# Patient Record
Sex: Female | Born: 1955 | Race: White | Hispanic: No | Marital: Married | Smoking: Never smoker
Health system: Southern US, Community
[De-identification: ages and names within clinical notes are randomized; demographics above are authoritative.]

## PROBLEM LIST (undated history)

## (undated) DIAGNOSIS — M1990 Unspecified osteoarthritis, unspecified site: Secondary | ICD-10-CM

## (undated) DIAGNOSIS — R112 Nausea with vomiting, unspecified: Secondary | ICD-10-CM

## (undated) DIAGNOSIS — T7840XA Allergy, unspecified, initial encounter: Secondary | ICD-10-CM

## (undated) DIAGNOSIS — E039 Hypothyroidism, unspecified: Secondary | ICD-10-CM

## (undated) DIAGNOSIS — E079 Disorder of thyroid, unspecified: Secondary | ICD-10-CM

## (undated) DIAGNOSIS — D649 Anemia, unspecified: Secondary | ICD-10-CM

## (undated) DIAGNOSIS — I10 Essential (primary) hypertension: Secondary | ICD-10-CM

## (undated) DIAGNOSIS — Z9889 Other specified postprocedural states: Secondary | ICD-10-CM

## (undated) HISTORY — PX: BIOPSY THYROID: PRO38

## (undated) HISTORY — DX: Anemia, unspecified: D64.9

## (undated) HISTORY — PX: SIGMOIDOSCOPY: SUR1295

## (undated) HISTORY — PX: TONSILLECTOMY: SUR1361

## (undated) HISTORY — DX: Disorder of thyroid, unspecified: E07.9

## (undated) HISTORY — DX: Essential (primary) hypertension: I10

## (undated) HISTORY — DX: Allergy, unspecified, initial encounter: T78.40XA

## (undated) HISTORY — DX: Unspecified osteoarthritis, unspecified site: M19.90

---

## 1983-11-26 HISTORY — PX: FLEXIBLE SIGMOIDOSCOPY: SHX1649

## 1983-11-26 HISTORY — PX: EXPLORATORY LAPAROTOMY: SUR591

## 1996-11-25 HISTORY — PX: ABDOMINAL HYSTERECTOMY: SHX81

## 1998-11-10 ENCOUNTER — Emergency Department (HOSPITAL_COMMUNITY): Admission: EM | Admit: 1998-11-10 | Discharge: 1998-11-10 | Payer: Self-pay

## 1999-01-18 ENCOUNTER — Encounter: Admission: RE | Admit: 1999-01-18 | Discharge: 1999-02-05 | Payer: Self-pay | Admitting: Orthopedic Surgery

## 1999-01-31 ENCOUNTER — Other Ambulatory Visit: Admission: RE | Admit: 1999-01-31 | Discharge: 1999-01-31 | Payer: Self-pay | Admitting: Gynecology

## 1999-12-07 ENCOUNTER — Encounter: Admission: RE | Admit: 1999-12-07 | Discharge: 1999-12-07 | Payer: Self-pay | Admitting: Gynecology

## 1999-12-07 ENCOUNTER — Encounter: Payer: Self-pay | Admitting: Gynecology

## 2000-03-12 ENCOUNTER — Other Ambulatory Visit: Admission: RE | Admit: 2000-03-12 | Discharge: 2000-03-12 | Payer: Self-pay | Admitting: Gynecology

## 2000-12-24 ENCOUNTER — Encounter: Admission: RE | Admit: 2000-12-24 | Discharge: 2000-12-24 | Payer: Self-pay | Admitting: Gynecology

## 2000-12-24 ENCOUNTER — Encounter: Payer: Self-pay | Admitting: Gynecology

## 2001-04-15 ENCOUNTER — Other Ambulatory Visit: Admission: RE | Admit: 2001-04-15 | Discharge: 2001-04-15 | Payer: Self-pay | Admitting: Gynecology

## 2001-12-29 ENCOUNTER — Encounter: Payer: Self-pay | Admitting: Gynecology

## 2001-12-29 ENCOUNTER — Encounter: Admission: RE | Admit: 2001-12-29 | Discharge: 2001-12-29 | Payer: Self-pay | Admitting: Gynecology

## 2002-04-22 ENCOUNTER — Other Ambulatory Visit: Admission: RE | Admit: 2002-04-22 | Discharge: 2002-04-22 | Payer: Self-pay | Admitting: Gynecology

## 2003-01-06 ENCOUNTER — Encounter: Payer: Self-pay | Admitting: Gynecology

## 2003-01-06 ENCOUNTER — Encounter: Admission: RE | Admit: 2003-01-06 | Discharge: 2003-01-06 | Payer: Self-pay | Admitting: Gynecology

## 2003-12-19 ENCOUNTER — Other Ambulatory Visit: Admission: RE | Admit: 2003-12-19 | Discharge: 2003-12-19 | Payer: Self-pay | Admitting: Gynecology

## 2004-01-20 ENCOUNTER — Ambulatory Visit (HOSPITAL_COMMUNITY): Admission: RE | Admit: 2004-01-20 | Discharge: 2004-01-20 | Payer: Self-pay | Admitting: Gynecology

## 2004-10-02 ENCOUNTER — Ambulatory Visit: Payer: Self-pay | Admitting: Internal Medicine

## 2004-10-03 ENCOUNTER — Ambulatory Visit: Payer: Self-pay | Admitting: Internal Medicine

## 2004-10-04 ENCOUNTER — Ambulatory Visit (HOSPITAL_COMMUNITY): Admission: RE | Admit: 2004-10-04 | Discharge: 2004-10-04 | Payer: Self-pay | Admitting: Internal Medicine

## 2004-10-11 ENCOUNTER — Ambulatory Visit (HOSPITAL_COMMUNITY): Admission: RE | Admit: 2004-10-11 | Discharge: 2004-10-11 | Payer: Self-pay | Admitting: Internal Medicine

## 2004-10-11 ENCOUNTER — Encounter (INDEPENDENT_AMBULATORY_CARE_PROVIDER_SITE_OTHER): Payer: Self-pay | Admitting: *Deleted

## 2005-02-01 ENCOUNTER — Ambulatory Visit: Payer: Self-pay | Admitting: Internal Medicine

## 2005-02-19 ENCOUNTER — Ambulatory Visit (HOSPITAL_COMMUNITY): Admission: RE | Admit: 2005-02-19 | Discharge: 2005-02-19 | Payer: Self-pay | Admitting: Gynecology

## 2005-05-03 ENCOUNTER — Other Ambulatory Visit: Admission: RE | Admit: 2005-05-03 | Discharge: 2005-05-03 | Payer: Self-pay | Admitting: Dermatology

## 2006-02-20 ENCOUNTER — Ambulatory Visit (HOSPITAL_COMMUNITY): Admission: RE | Admit: 2006-02-20 | Discharge: 2006-02-20 | Payer: Self-pay | Admitting: Gynecology

## 2006-05-22 ENCOUNTER — Ambulatory Visit: Payer: Self-pay | Admitting: Internal Medicine

## 2006-05-23 ENCOUNTER — Ambulatory Visit: Payer: Self-pay | Admitting: Internal Medicine

## 2006-06-05 ENCOUNTER — Ambulatory Visit (HOSPITAL_COMMUNITY): Admission: RE | Admit: 2006-06-05 | Discharge: 2006-06-05 | Payer: Self-pay | Admitting: Internal Medicine

## 2006-10-22 ENCOUNTER — Other Ambulatory Visit: Admission: RE | Admit: 2006-10-22 | Discharge: 2006-10-22 | Payer: Self-pay | Admitting: Gynecology

## 2007-03-12 ENCOUNTER — Ambulatory Visit (HOSPITAL_COMMUNITY): Admission: RE | Admit: 2007-03-12 | Discharge: 2007-03-12 | Payer: Self-pay | Admitting: Gynecology

## 2007-07-09 ENCOUNTER — Ambulatory Visit: Payer: Self-pay | Admitting: Family Medicine

## 2007-07-14 ENCOUNTER — Ambulatory Visit: Payer: Self-pay | Admitting: Internal Medicine

## 2007-07-14 DIAGNOSIS — E049 Nontoxic goiter, unspecified: Secondary | ICD-10-CM | POA: Insufficient documentation

## 2007-07-19 LAB — CONVERTED CEMR LAB
CO2: 32 meq/L (ref 19–32)
Chloride: 104 meq/L (ref 96–112)
GFR calc Af Amer: 113 mL/min
Potassium: 3.8 meq/L (ref 3.5–5.1)
Sodium: 141 meq/L (ref 135–145)
T3, Free: 3.2 pg/mL (ref 2.3–4.2)
TSH: 0.25 microintl units/mL — ABNORMAL LOW (ref 0.35–5.50)

## 2007-07-20 ENCOUNTER — Encounter (INDEPENDENT_AMBULATORY_CARE_PROVIDER_SITE_OTHER): Payer: Self-pay | Admitting: *Deleted

## 2007-07-23 ENCOUNTER — Ambulatory Visit (HOSPITAL_COMMUNITY): Admission: RE | Admit: 2007-07-23 | Discharge: 2007-07-23 | Payer: Self-pay | Admitting: Internal Medicine

## 2007-07-28 ENCOUNTER — Encounter (INDEPENDENT_AMBULATORY_CARE_PROVIDER_SITE_OTHER): Payer: Self-pay | Admitting: *Deleted

## 2007-08-14 ENCOUNTER — Telehealth (INDEPENDENT_AMBULATORY_CARE_PROVIDER_SITE_OTHER): Payer: Self-pay | Admitting: *Deleted

## 2007-11-26 HISTORY — PX: COLONOSCOPY: SHX174

## 2008-01-04 ENCOUNTER — Emergency Department (HOSPITAL_COMMUNITY): Admission: EM | Admit: 2008-01-04 | Discharge: 2008-01-04 | Payer: Self-pay | Admitting: Emergency Medicine

## 2008-01-04 ENCOUNTER — Telehealth (INDEPENDENT_AMBULATORY_CARE_PROVIDER_SITE_OTHER): Payer: Self-pay | Admitting: *Deleted

## 2008-01-28 ENCOUNTER — Ambulatory Visit (HOSPITAL_COMMUNITY): Admission: RE | Admit: 2008-01-28 | Discharge: 2008-01-28 | Payer: Self-pay | Admitting: Gastroenterology

## 2008-01-28 LAB — HM COLONOSCOPY

## 2008-04-05 ENCOUNTER — Ambulatory Visit (HOSPITAL_COMMUNITY): Admission: RE | Admit: 2008-04-05 | Discharge: 2008-04-05 | Payer: Self-pay | Admitting: Gynecology

## 2008-07-26 ENCOUNTER — Ambulatory Visit: Payer: Self-pay | Admitting: Internal Medicine

## 2008-07-26 DIAGNOSIS — D649 Anemia, unspecified: Secondary | ICD-10-CM | POA: Insufficient documentation

## 2008-07-26 DIAGNOSIS — M899 Disorder of bone, unspecified: Secondary | ICD-10-CM | POA: Insufficient documentation

## 2008-07-26 DIAGNOSIS — E785 Hyperlipidemia, unspecified: Secondary | ICD-10-CM | POA: Insufficient documentation

## 2008-07-26 DIAGNOSIS — M949 Disorder of cartilage, unspecified: Secondary | ICD-10-CM

## 2008-07-26 DIAGNOSIS — M25512 Pain in left shoulder: Secondary | ICD-10-CM | POA: Insufficient documentation

## 2008-07-26 DIAGNOSIS — I10 Essential (primary) hypertension: Secondary | ICD-10-CM | POA: Insufficient documentation

## 2008-07-26 LAB — CONVERTED CEMR LAB: HDL goal, serum: 50 mg/dL

## 2008-07-27 ENCOUNTER — Ambulatory Visit: Payer: Self-pay | Admitting: Internal Medicine

## 2008-07-30 LAB — CONVERTED CEMR LAB
AST: 29 units/L (ref 0–37)
Alkaline Phosphatase: 63 units/L (ref 39–117)
BUN: 12 mg/dL (ref 6–23)
Basophils Relative: 0.1 % (ref 0.0–3.0)
Calcium: 8.8 mg/dL (ref 8.4–10.5)
Cholesterol: 285 mg/dL (ref 0–200)
Creatinine, Ser: 0.7 mg/dL (ref 0.4–1.2)
Eosinophils Relative: 0.8 % (ref 0.0–5.0)
GFR calc Af Amer: 112 mL/min
GFR calc non Af Amer: 93 mL/min
HCT: 41.2 % (ref 36.0–46.0)
Hemoglobin: 14.5 g/dL (ref 12.0–15.0)
MCHC: 35.1 g/dL (ref 30.0–36.0)
Monocytes Relative: 4.5 % (ref 3.0–12.0)
RDW: 13 % (ref 11.5–14.6)
Rhuematoid fact SerPl-aCnc: <20 intl units/mL — ABNORMAL LOW (ref 0.0–20.0)
Total Bilirubin: 0.8 mg/dL (ref 0.3–1.2)
Triglycerides: 83 mg/dL (ref 0–149)
Vit D, 1,25-Dihydroxy: 39 (ref 30–89)

## 2008-08-02 ENCOUNTER — Telehealth (INDEPENDENT_AMBULATORY_CARE_PROVIDER_SITE_OTHER): Payer: Self-pay | Admitting: *Deleted

## 2008-08-02 ENCOUNTER — Telehealth: Payer: Self-pay | Admitting: Internal Medicine

## 2008-08-02 ENCOUNTER — Encounter (INDEPENDENT_AMBULATORY_CARE_PROVIDER_SITE_OTHER): Payer: Self-pay | Admitting: *Deleted

## 2008-08-03 ENCOUNTER — Encounter (INDEPENDENT_AMBULATORY_CARE_PROVIDER_SITE_OTHER): Payer: Self-pay | Admitting: *Deleted

## 2008-08-05 ENCOUNTER — Ambulatory Visit (HOSPITAL_COMMUNITY): Admission: RE | Admit: 2008-08-05 | Discharge: 2008-08-05 | Payer: Self-pay | Admitting: Internal Medicine

## 2008-08-08 ENCOUNTER — Telehealth (INDEPENDENT_AMBULATORY_CARE_PROVIDER_SITE_OTHER): Payer: Self-pay | Admitting: *Deleted

## 2008-08-12 ENCOUNTER — Encounter (INDEPENDENT_AMBULATORY_CARE_PROVIDER_SITE_OTHER): Payer: Self-pay | Admitting: *Deleted

## 2008-09-20 ENCOUNTER — Encounter: Payer: Self-pay | Admitting: Internal Medicine

## 2008-09-27 ENCOUNTER — Encounter (HOSPITAL_COMMUNITY): Admission: RE | Admit: 2008-09-27 | Discharge: 2008-11-24 | Payer: Self-pay | Admitting: Endocrinology

## 2008-10-07 ENCOUNTER — Encounter (INDEPENDENT_AMBULATORY_CARE_PROVIDER_SITE_OTHER): Payer: Self-pay | Admitting: Interventional Radiology

## 2008-10-07 ENCOUNTER — Ambulatory Visit (HOSPITAL_COMMUNITY): Admission: RE | Admit: 2008-10-07 | Discharge: 2008-10-07 | Payer: Self-pay | Admitting: Endocrinology

## 2008-10-17 ENCOUNTER — Encounter: Payer: Self-pay | Admitting: Internal Medicine

## 2008-11-30 ENCOUNTER — Ambulatory Visit: Payer: Self-pay | Admitting: Internal Medicine

## 2008-12-02 ENCOUNTER — Ambulatory Visit (HOSPITAL_COMMUNITY): Admission: RE | Admit: 2008-12-02 | Discharge: 2008-12-02 | Payer: Self-pay | Admitting: Endocrinology

## 2009-04-27 ENCOUNTER — Ambulatory Visit (HOSPITAL_COMMUNITY): Admission: RE | Admit: 2009-04-27 | Discharge: 2009-04-27 | Payer: Self-pay | Admitting: Gynecology

## 2009-07-27 ENCOUNTER — Encounter (INDEPENDENT_AMBULATORY_CARE_PROVIDER_SITE_OTHER): Payer: Self-pay | Admitting: *Deleted

## 2009-08-08 ENCOUNTER — Ambulatory Visit: Payer: Self-pay | Admitting: Internal Medicine

## 2009-08-17 ENCOUNTER — Ambulatory Visit: Payer: Self-pay | Admitting: Internal Medicine

## 2009-08-17 DIAGNOSIS — Z923 Personal history of irradiation: Secondary | ICD-10-CM | POA: Insufficient documentation

## 2010-01-19 ENCOUNTER — Telehealth (INDEPENDENT_AMBULATORY_CARE_PROVIDER_SITE_OTHER): Payer: Self-pay | Admitting: *Deleted

## 2010-01-23 ENCOUNTER — Ambulatory Visit: Payer: Self-pay | Admitting: Internal Medicine

## 2010-01-23 DIAGNOSIS — M545 Low back pain, unspecified: Secondary | ICD-10-CM | POA: Insufficient documentation

## 2010-01-23 DIAGNOSIS — R002 Palpitations: Secondary | ICD-10-CM | POA: Insufficient documentation

## 2010-01-25 ENCOUNTER — Encounter: Admission: RE | Admit: 2010-01-25 | Discharge: 2010-02-26 | Payer: Self-pay | Admitting: Internal Medicine

## 2010-01-25 ENCOUNTER — Encounter: Payer: Self-pay | Admitting: Internal Medicine

## 2010-02-23 ENCOUNTER — Encounter: Payer: Self-pay | Admitting: Internal Medicine

## 2010-05-03 ENCOUNTER — Ambulatory Visit (HOSPITAL_COMMUNITY): Admission: RE | Admit: 2010-05-03 | Discharge: 2010-05-03 | Payer: Self-pay | Admitting: Gynecology

## 2010-10-23 ENCOUNTER — Telehealth (INDEPENDENT_AMBULATORY_CARE_PROVIDER_SITE_OTHER): Payer: Self-pay | Admitting: *Deleted

## 2010-11-06 ENCOUNTER — Telehealth (INDEPENDENT_AMBULATORY_CARE_PROVIDER_SITE_OTHER): Payer: Self-pay | Admitting: *Deleted

## 2010-12-16 ENCOUNTER — Encounter: Payer: Self-pay | Admitting: Endocrinology

## 2010-12-17 ENCOUNTER — Encounter: Payer: Self-pay | Admitting: Gynecology

## 2010-12-27 NOTE — Progress Notes (Signed)
Summary: heart palpitaion to UC  Phone Note Call from Patient   Caller: Patient Summary of Call: pt c/o heart palpitation x3days. pt denies any SOB,numbness, or tingling. pt advise dr hopper out of office until monday. pt states that she does not have a cardiologist. pt offer appt with another physician pt decline stating that she would rather just wait to see dr hopper on monday. advise pt that with it being her heart we strong recommend that she be seen some where today ED, UC or another physician. pt did agree to go to UC today.............Marland KitchenFelecia Deloach CMA  January 19, 2010 9:06 AM  Initial call taken by: Jeremy Johann CMA,  January 19, 2010 9:07 AM

## 2010-12-27 NOTE — Miscellaneous (Signed)
Summary: PT Initial Summary/[facility]  PT Initial Summary/[facility]   Imported By: Lanelle Bal 02/01/2010 10:06:49  _____________________________________________________________________  External Attachment:    Type:   Image     Comment:   External Document

## 2010-12-27 NOTE — Progress Notes (Signed)
Summary: amlodipine-benazepril refill  Phone Note Refill Request Message from:  Fax from Pharmacy on November 06, 2010 1:23 PM  Refills Requested: Medication #1:  LOTREL 5-20 MG  CAPS 1 once daily   Last Refilled: 08/13/2010 [facility], [facility],  [phone]   fax-[phone]       qty-90  Next Appointment Scheduled: none Initial call taken by: Jerolyn Shin,  November 06, 2010 1:25 PM    Prescriptions: LOTREL 5-20 MG  CAPS (AMLODIPINE BESY-BENAZEPRIL HCL) 1 once daily  #90 Capsule x 0   Entered by:   Shonna Chock CMA   Authorized by:   Marga Melnick MD   Signed by:   Shonna Chock CMA on 11/06/2010   Method used:   Electronically to        [facility]* (retail)       [address]       [address] [place] [address]       [place], [place]  [address]       Ph: [phone]       Fax: [phone]   RxID:   [id number]

## 2010-12-27 NOTE — Miscellaneous (Signed)
Summary: PT Discharge/[facility]  PT Discharge/[facility]   Imported By: Lanelle Bal 03/02/2010 12:51:17  _____________________________________________________________________  External Attachment:    Type:   Image     Comment:   External Document

## 2010-12-27 NOTE — Progress Notes (Signed)
Summary: Refill Reuqest  Phone Note Refill Request Call back at [phone] Message from:  Pharmacy on October 23, 2010 8:40 AM  Refills Requested: Medication #1:  MAXZIDE-25 37.5-25 MG  TABS 1 once daily   Dosage confirmed as above?Dosage Confirmed   Brand Name Necessary? No   Supply Requested: 3 months   Last Refilled: 08/17/2009 [facility]  Next Appointment Scheduled: none Initial call taken by: Harold Barban,  October 23, 2010 8:41 AM    Prescriptions: MAXZIDE-25 37.5-25 MG  TABS (TRIAMTERENE-HCTZ) 1 once daily  #90 Tablet x 0   Entered by:   Shonna Chock CMA   Authorized by:   Marga Melnick MD   Signed by:   Shonna Chock CMA on 10/23/2010   Method used:   Electronically to        [facility]* (retail)       [address]       [address] [place] [address]       [place], [place]  [address]       Ph: [phone]       Fax: [phone]   RxID:   [id number]

## 2010-12-27 NOTE — Assessment & Plan Note (Signed)
Summary: irregular heart beat/ok per alida/kdc   Vital Signs:  Patient profile:   55 year old female Weight:      184.8 pounds BMI:     31.34 O2 Sat:      96 % Temp:     98.3 degrees F oral Pulse rate:   90 / minute Resp:     14 per minute BP sitting:   110 / 72  (left arm) Cuff size:   large  Vitals Entered By: Shonna Chock (January 23, 2010 1:39 PM) CC: Irregular heartbeat, Palpitations Comments REVIEWED MED LIST, PATIENT AGREED DOSE AND INSTRUCTION CORRECT    CC:  Irregular heartbeat and Palpitations.  History of Present Illness:  Palpitations      This is a 55 year old woman who presents with Palpitations since 01/18/2010.  The patient denies dizziness, presyncope, syncope, chest pain, shortness of breath, and throat tightness.  She is on HRT for menopausal related  heat intolerance.  The patient denies the following symptoms: blurred vision, numbness, weakness, diaphoresis, nausea, shortness of breath, and weight loss.  The palpitations are described as a sensation of the heart skipping beats.  The palpitations are sudden in onset, intermittent, occur at rest, and occur during the day; not worse with exercise.  The palpitations are worse with increased caffeine intake last week as diet cola, tea, & chocolate.PMH of RAI 12/2008 for multinodular goiter; TSH was therapeupeutic @ 2.4 @ Dr Willeen Cass office.  Allergies: 1)  ! Sulfa  Review of Systems MS:  Complains of low back pain; LBP affects sleep; no Rx. Neuro:  Denies brief paralysis, numbness, tingling, tremors, and weakness. Psych:  Denies anxiety.  Physical Exam  General:  well-nourished,in no acute distress; alert,appropriate and cooperative throughout examination Eyes:  No corneal or conjunctival inflammation noted.Perrla. No lid lag Neck:  No deformities, masses, or tenderness noted.Asymmetry R > L with fibrous texture Heart:  Normal rate and regular rhythm. S1 and S2 normal without gallop, murmur, click, rub .  S4 Pulses:  R and L carotid,radial,dorsalis pedis and posterior tibial pulses are full and equal bilaterally Extremities:  No clubbing, cyanosis, edema, or deformity noted with normal full range of motion of all joints.  Neg SLR Neurologic:  alert & oriented X3, strength normal in all extremities, and DTRs symmetrical and normal. No tremor  Skin:  Intact without suspicious lesions or rashes Cervical Nodes:  No lymphadenopathy noted Axillary Nodes:  No palpable lymphadenopathy Psych:  memory intact for recent and remote, normally interactive, and good eye contact.     Impression & Recommendations:  Problem # 1:  PALPITATIONS (ICD-785.1)  probably from stimulants  Orders: EKG w/ Interpretation (93000)  Problem # 2:  LOW BACK PAIN SYNDROME (ICD-724.2)  probably related to mattress  Orders: Physical Therapy Referral (PT)  Complete Medication List: 1)  Lotrel 5-20 Mg Caps (Amlodipine besy-benazepril hcl) .Marland Kitchen.. 1 once daily 2)  Maxzide-25 37.5-25 Mg Tabs (Triamterene-hctz) .Marland Kitchen.. 1 once daily 3)  Synthroid 75 Mcg Tabs (Levothyroxine sodium) .... Take 1 tab once daily 4)  Vivelle-dot 0.0375 Mg/24hr Pttw (Estradiol) .... Twice weekly  Patient Instructions: 1)  AS Tylenol 1-2 at bedtime as needed. Evaluate you mattress for firmness.Avoid stimulants; Event monitor if palpitations no better with decreased caffeine

## 2011-01-14 ENCOUNTER — Encounter: Payer: Self-pay | Admitting: Internal Medicine

## 2011-01-29 ENCOUNTER — Ambulatory Visit (INDEPENDENT_AMBULATORY_CARE_PROVIDER_SITE_OTHER): Payer: Self-pay | Admitting: Family Medicine

## 2011-01-29 DIAGNOSIS — Z713 Dietary counseling and surveillance: Secondary | ICD-10-CM

## 2011-01-31 NOTE — Miscellaneous (Signed)
Summary: Home Visit Report/[facility]  Home Visit Report/[facility]   Imported By: Maryln Gottron 01/22/2011 14:44:53  _____________________________________________________________________  External Attachment:    Type:   Image     Comment:   External Document

## 2011-04-04 ENCOUNTER — Other Ambulatory Visit (HOSPITAL_COMMUNITY): Payer: Self-pay | Admitting: Gynecology

## 2011-04-04 DIAGNOSIS — Z1231 Encounter for screening mammogram for malignant neoplasm of breast: Secondary | ICD-10-CM

## 2011-04-09 NOTE — Op Note (Signed)
NAME:  Joan [name], Joan [name]                   ACCOUNT NO.:  [id number]   MEDICAL RECORD NO.:  [id number]          PATIENT TYPE:  AMB   LOCATION:  ENDO                         FACILITY:  [facility]   PHYSICIAN:  Bernette Redbird, M.D.   DATE OF BIRTH:  25-May-1956   DATE OF PROCEDURE:  DATE OF DISCHARGE:                               OPERATIVE REPORT   PROCEDURE:  Colonoscopy.   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   FINDINGS:  Normal exam to the terminal ileum.   PROCEDURE:  The procedure had been reviewed with the patient including  its risks and she provided written consent.  Sedation was fentanyl 100  mcg and Versed 11 mg IV without clinical instability.  The Pentax adult  video colonoscope was advanced without significant difficulty around the  colon to the cecum as identified by visualization of the appendiceal  orifice and then for short distance into a normal-appearing terminal  ileum.  Pullback was then performed.  The quality of prep was excellent  and it is felt that all areas were well seen.   This was a completely normal examination.  No polyps, cancer, colitis,  vascular ectasia or diverticular disease was observed.  Retroflexion in  the rectum and reinspection of the rectosigmoid were unremarkable.  No  biopsies were obtained.  The patient tolerated the procedure well and  there no apparent complications.   IMPRESSION:  Normal screening colonoscopy in a standard risk  individual.   PLAN:  Repeat colonoscopy in 10 years.           ______________________________  Bernette Redbird, M.D.     RB/MEDQ  D:  01/28/2008  T:  01/28/2008  Job:  [id number]   cc:   Titus Dubin. Alwyn Ren, MD,FACP,FCCP  [phone] [place]  [place] [address]

## 2011-04-10 ENCOUNTER — Encounter: Payer: Self-pay | Admitting: Internal Medicine

## 2011-04-12 ENCOUNTER — Other Ambulatory Visit: Payer: Self-pay | Admitting: Internal Medicine

## 2011-04-25 ENCOUNTER — Encounter: Payer: Self-pay | Admitting: Internal Medicine

## 2011-04-26 ENCOUNTER — Ambulatory Visit (INDEPENDENT_AMBULATORY_CARE_PROVIDER_SITE_OTHER): Payer: 59 | Admitting: Internal Medicine

## 2011-04-26 ENCOUNTER — Encounter: Payer: Self-pay | Admitting: Internal Medicine

## 2011-04-26 VITALS — BP 122/79 | HR 74 | Temp 98.2°F | Resp 14 | Ht 64.25 in | Wt 185.6 lb

## 2011-04-26 DIAGNOSIS — M545 Low back pain, unspecified: Secondary | ICD-10-CM

## 2011-04-26 DIAGNOSIS — I10 Essential (primary) hypertension: Secondary | ICD-10-CM

## 2011-04-26 DIAGNOSIS — E039 Hypothyroidism, unspecified: Secondary | ICD-10-CM

## 2011-04-26 DIAGNOSIS — E049 Nontoxic goiter, unspecified: Secondary | ICD-10-CM

## 2011-04-26 DIAGNOSIS — E785 Hyperlipidemia, unspecified: Secondary | ICD-10-CM

## 2011-04-26 DIAGNOSIS — Z0000 Encounter for general adult medical examination without abnormal findings: Secondary | ICD-10-CM

## 2011-04-26 DIAGNOSIS — D649 Anemia, unspecified: Secondary | ICD-10-CM

## 2011-04-26 NOTE — Progress Notes (Signed)
Subjective:    Patient ID: Joan [name], female    DOB: 1956/10/28, 55 y.o.   MRN: [id number]  HPI Joan [name] is here for a physical; she has been seeing Dr Talmage Nap in reference to dyslipidemia & hypothyroid . Dyslipidemia assessment: Prior Advanced Lipid Testing: NMR Lipoprofile 2011.   Family history of premature CAD/ MI: bro MI @ 22 .  Nutrition: low fat .  Exercise: decreased due to back  pain . Diabetes : no . HTN: 120/75 on average. Smoking history  : never .   Weight :  Varies, 10# down since 2011. ROS: fatigue: no ; chest pain : no ;claudication: no; palpitations: no; abd pain/bowel changes: no ; myalgias:"aching all over" on 5 mg Crestor after 2 weeks. She decreased to qod X 1 week ;  syncope : no ; memory loss: no;skin changes: no. Lab results reviewed :LDL 168 on 04/06/11 pre Crestor.   Review of Systems Patient reports no vision/ hearing  changes, adenopathy,fever,   persistant / recurrent hoarseness , swallowing issues, edema,persistant /recurrent cough, hemoptysis, dyspnea( rest/ exertional/paroxysmal nocturnal), gastrointestinal bleeding(melena, rectal bleeding),  significant heartburn,  GU symptoms(dysuria, hematuria,pyuria, incontinence), Gyn symptoms(abnormal  bleeding , pain),  syncope, focal weakness, abnormal bruising or bleeding, anxiety,or depression. She has occasional numbness & tingling in hands @ night. She has had pain in low back & L shoulder; no treatment.     Objective:   Physical Exam Gen.: Healthy and well-nourished in appearance. Alert, appropriate and cooperative throughout exam. 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 Eyes: No corneal or conjunctival inflammation noted. Pupils equal round reactive to light and accommodation. Fundal exam is benign without hemorrhages, exudate, papilledema. Extraocular motion intact. Vision grossly normal. Ears: External  ear exam reveals no significant lesions or deformities. Canals clear .TMs normal. Hearing is  grossly normal bilaterally. Nose: External nasal exam reveals no deformity or inflammation. Nasal mucosa are pink and moist. No lesions or exudates noted. Septum  not deviated  Mouth: Oral mucosa and oropharynx reveal no lesions or exudates. Teeth in good repair. Neck: No deformities, masses, or tenderness noted. Range of motion & . Thyroid : R > L & firm w/o nodules. Lungs: Normal respiratory effort; chest expands symmetrically. Lungs are clear to auscultation without rales, wheezes, or increased work of breathing. Heart: Normal rate and rhythm. Normal S1 and S2. No gallop, click, or rub. S4 with slurring ; no murmur. Abdomen: Bowel sounds normal; abdomen soft and nontender. No masses, organomegaly or hernias noted. Genitalia: Dr Nicholas Lose.                                                                                                                                                               Musculoskeletal/extremities: No deformity or scoliosis noted of  the thoracic  or lumbar spine. No clubbing, cyanosis, edema, or deformity noted. Range of motion  normal .Tone & strength  normal.Joints normal. Nail health  Good. Pain R LS area with SLR Vascular: Carotid, radial artery, dorsalis pedis and dorsalis posterior tibial pulses are full and equal. No bruits present. Neurologic: Alert and oriented x3. Deep tendon reflexes symmetrical and normal.                                                                                       Skin: Intact without suspicious lesions or rashes. Lymph: No cervical, axillary, or inguinal lymphadenopathy present. Psych: Mood and affect are normal. Normally interactive                                                                                         Assessment & Plan:  #1 comprehensive physical exam; no acute issues  #2 dyslipidemia; LDL goal equal less than 100 based on advanced  testing. Strong family history of coronary disease with one instance of premature  MI  #3 low back syndrome, chronic  Plan: Fasting lipids, AST, ALT, and CK after 2 weeks of Crestor 5 mg daily  #3 flexion/ extension films of the low back.

## 2011-04-26 NOTE — Assessment & Plan Note (Signed)
As per Dr Balan 

## 2011-04-26 NOTE — Assessment & Plan Note (Signed)
Monitor CBC 

## 2011-04-26 NOTE — Patient Instructions (Signed)
Preventive Health Care: Exercise  30-45  minutes a day, 3-4 days a week. Walking is especially valuable in preventing Osteoporosis. Eat a low-fat diet with lots of fruits and vegetables, up to 7-9 servings per day. Avoid obesity; your goal = waist less than 35 inches.Consume less than 30 grams of sugar per day from foods & drinks with High Fructose Corn Syrup as #2,3 or #4 on label.  

## 2011-04-26 NOTE — Assessment & Plan Note (Signed)
Crestor appropriate; fasting lipids, CK ,AST & ALT after 10  Weeks of Rx

## 2011-04-28 ENCOUNTER — Encounter: Payer: Self-pay | Admitting: Internal Medicine

## 2011-04-30 ENCOUNTER — Encounter: Payer: 59 | Attending: Family Medicine | Admitting: *Deleted

## 2011-04-30 DIAGNOSIS — E7800 Pure hypercholesterolemia, unspecified: Secondary | ICD-10-CM | POA: Insufficient documentation

## 2011-04-30 DIAGNOSIS — E663 Overweight: Secondary | ICD-10-CM | POA: Insufficient documentation

## 2011-04-30 DIAGNOSIS — Z713 Dietary counseling and surveillance: Secondary | ICD-10-CM | POA: Insufficient documentation

## 2011-05-07 ENCOUNTER — Ambulatory Visit (HOSPITAL_COMMUNITY)
Admission: RE | Admit: 2011-05-07 | Discharge: 2011-05-07 | Disposition: A | Discharge status: Home / Self Care | Payer: 59 | Source: Ambulatory Visit | Attending: Gynecology | Admitting: Gynecology

## 2011-05-07 DIAGNOSIS — Z1231 Encounter for screening mammogram for malignant neoplasm of breast: Secondary | ICD-10-CM | POA: Insufficient documentation

## 2011-05-10 ENCOUNTER — Other Ambulatory Visit: Payer: Self-pay | Admitting: Internal Medicine

## 2011-05-10 ENCOUNTER — Other Ambulatory Visit (INDEPENDENT_AMBULATORY_CARE_PROVIDER_SITE_OTHER): Payer: 59

## 2011-05-10 DIAGNOSIS — T887XXA Unspecified adverse effect of drug or medicament, initial encounter: Secondary | ICD-10-CM

## 2011-05-10 DIAGNOSIS — E785 Hyperlipidemia, unspecified: Secondary | ICD-10-CM

## 2011-05-10 LAB — AST: AST: 21 U/L (ref 0–37)

## 2011-05-10 LAB — CK: Total CK: 253 U/L — ABNORMAL HIGH (ref 7–177)

## 2011-05-10 LAB — LIPID PANEL
HDL: 53.1 mg/dL (ref >39.00)
Total CHOL/HDL Ratio: 3
VLDL: 16.4 mg/dL (ref 0.0–40.0)

## 2011-07-08 ENCOUNTER — Other Ambulatory Visit: Payer: Self-pay | Admitting: Internal Medicine

## 2011-09-03 ENCOUNTER — Encounter: Payer: Self-pay | Admitting: *Deleted

## 2011-09-03 ENCOUNTER — Telehealth: Payer: Self-pay

## 2011-09-03 NOTE — Telephone Encounter (Signed)
Message left on voicemail: Patient would like an ok from the doctor to begin exceris program through [facility]. Patient states this could be Emailed to her, patient is in the [facility] system  Dr.Hopper please advise

## 2011-09-03 NOTE — Telephone Encounter (Signed)
Graduated exercise program recommended building up to 30-45 minutes 3-4 times a week over 6-8 weeks . Douglass Rivers , MD

## 2011-09-03 NOTE — Telephone Encounter (Signed)
Discuss with patient, letter mailed to Pt

## 2011-10-07 ENCOUNTER — Other Ambulatory Visit: Payer: Self-pay | Admitting: Gynecology

## 2011-11-11 ENCOUNTER — Other Ambulatory Visit: Payer: Self-pay | Admitting: Internal Medicine

## 2012-04-15 ENCOUNTER — Other Ambulatory Visit: Payer: Self-pay | Admitting: Internal Medicine

## 2012-04-15 NOTE — Telephone Encounter (Signed)
Patient needs to schedule a CPX  

## 2012-05-11 ENCOUNTER — Other Ambulatory Visit: Payer: Self-pay | Admitting: Internal Medicine

## 2012-05-11 NOTE — Telephone Encounter (Signed)
Pending CPX 07-09-12

## 2012-05-27 ENCOUNTER — Other Ambulatory Visit (HOSPITAL_COMMUNITY): Payer: Self-pay | Admitting: Gynecology

## 2012-05-27 DIAGNOSIS — Z1231 Encounter for screening mammogram for malignant neoplasm of breast: Secondary | ICD-10-CM

## 2012-07-08 ENCOUNTER — Ambulatory Visit (HOSPITAL_COMMUNITY): Payer: 59

## 2012-07-09 ENCOUNTER — Ambulatory Visit (INDEPENDENT_AMBULATORY_CARE_PROVIDER_SITE_OTHER): Payer: 59 | Admitting: Internal Medicine

## 2012-07-09 ENCOUNTER — Encounter: Payer: Self-pay | Admitting: Internal Medicine

## 2012-07-09 VITALS — BP 114/78 | HR 74 | Temp 98.2°F | Resp 12 | Ht 63.08 in | Wt 187.6 lb

## 2012-07-09 DIAGNOSIS — Z0000 Encounter for general adult medical examination without abnormal findings: Secondary | ICD-10-CM

## 2012-07-09 DIAGNOSIS — M949 Disorder of cartilage, unspecified: Secondary | ICD-10-CM

## 2012-07-09 DIAGNOSIS — M899 Disorder of bone, unspecified: Secondary | ICD-10-CM

## 2012-07-09 NOTE — Progress Notes (Signed)
Subjective:    Patient ID: Joan [name], female    DOB: 10-31-1956, 56 y.o.   MRN: [id number]  HPI  Joan [name] is here for a physical;acute issues include chronic back pain      Review of Systems  She describes aching discomfort in the sacral area intermittently over the last 3 years. There was no specific trigger or injury prior to onset of symptoms. She was seen by physical therapy at that time with benefit. Yoga also has helped.  At that time rheumatoid factor was done for diffuse arthralgias;it was less than 20. The back discomfort is nonradiating; it is induced by lateral flexion of the waist. It is only positional & has not treated with medications. There have been no associated worrisome signs or symptoms. Specifically she denies stool or urinary incontinence; limb numbness or weakness;or  constitutional symptoms of fever, chills, or sweats. Additionally there's been no unexplained weight loss. She does have a history of osteopenia and is overdue for a bone density. She slipped in water in her garage & fell 05/28/12 fracturing her R radial head. She's being followed by [facility] for this.   Her Endocrinologist checked her lipids and is following her hypothyroidism post radioactive iodine ablation     Objective:   Physical Exam Gen.: well-nourished in appearance. Alert, appropriate and cooperative throughout exam. Head: Normocephalic without obvious abnormalities  Eyes: No corneal or conjunctival inflammation noted. Pupils equal round reactive to light and accommodation. Fundal exam is benign without hemorrhages, exudate, papilledema. Extraocular motion intact. Vision grossly normal. Ears: External  ear exam reveals no significant lesions or deformities. Canals clear .TMs normal. Hearing is grossly normal bilaterally. Nose: External nasal exam reveals no deformity or inflammation. Nasal mucosa are pink and moist. No lesions or exudates noted.  Mouth: Oral mucosa and oropharynx  reveal no lesions or exudates. Teeth in good repair. Neck: No deformities, masses, or tenderness noted. Range of motion essentially normal. Thyroid normal. Lungs: Normal respiratory effort; chest expands symmetrically. Lungs are clear to auscultation without rales, wheezes, or increased work of breathing. Heart: Normal rate and rhythm. Normal S1 and S2. No gallop, click, or rub. S4 w/o murmur. Abdomen: Bowel sounds normal; abdomen soft and nontender. No masses, organomegaly or hernias noted. Genitalia: Dr Joan [name] office                                                                 Musculoskeletal/extremities: Mild lordosis noted of  the thoracic  spine. No clubbing, cyanosis, edema, or deformity noted. Range of motion  normal .Tone & strength  normal.Joints normal. Nail health  good. Gait is normal to include tiptoe and heel walking. She is able to lie flat and sit up without help. Straight leg raising is negative bilaterally to 90 Vascular: Carotid, radial artery, dorsalis pedis and  posterior tibial pulses are full and equal. No bruits present. Neurologic: Alert and oriented x3. Deep tendon reflexes symmetrical and normal.          Skin: Intact without suspicious lesions or rashes. Lymph: No cervical, axillary lymphadenopathy present. Psych: Mood and affect are normal. Normally interactive  Assessment & Plan:  #1 comprehensive physical exam; no acute findings #2 see Problem List with Assessments & Recommendations #3 she has chronic low back (sacral) issues. Historically and by exam there is no suggestion of ruptured disc. Evaluation by Dr. Ethelene [name] recommended Plan:Please obtain results from Dr Joan [name] before recommending additional labs

## 2012-07-09 NOTE — Patient Instructions (Addendum)
The best exercises for the low back include freestyle swimming, stretch aerobics, and yoga.  Review and correct the record as indicated. Please share record with all medical staff seen.   If you activate My Chart; the results can be released to you as soon as they populate from the lab. If you choose not to use this program; the labs have to be reviewed, copied & mailed  causing a delay in getting the results to you.

## 2012-07-10 ENCOUNTER — Ambulatory Visit (HOSPITAL_COMMUNITY)
Admission: RE | Admit: 2012-07-10 | Discharge: 2012-07-10 | Disposition: A | Discharge status: Home / Self Care | Payer: 59 | Source: Ambulatory Visit | Attending: Gynecology | Admitting: Gynecology

## 2012-07-10 DIAGNOSIS — Z1231 Encounter for screening mammogram for malignant neoplasm of breast: Secondary | ICD-10-CM | POA: Insufficient documentation

## 2012-07-16 ENCOUNTER — Encounter: Payer: Self-pay | Admitting: Internal Medicine

## 2012-07-16 ENCOUNTER — Other Ambulatory Visit: Payer: 59

## 2012-07-16 ENCOUNTER — Ambulatory Visit (INDEPENDENT_AMBULATORY_CARE_PROVIDER_SITE_OTHER)
Admission: RE | Admit: 2012-07-16 | Discharge: 2012-07-16 | Disposition: A | Discharge status: Home / Self Care | Payer: 59 | Source: Ambulatory Visit | Attending: Internal Medicine | Admitting: Internal Medicine

## 2012-07-16 DIAGNOSIS — M899 Disorder of bone, unspecified: Secondary | ICD-10-CM

## 2012-07-16 DIAGNOSIS — M949 Disorder of cartilage, unspecified: Secondary | ICD-10-CM

## 2012-08-11 ENCOUNTER — Other Ambulatory Visit: Payer: Self-pay | Admitting: Internal Medicine

## 2012-12-21 ENCOUNTER — Other Ambulatory Visit: Payer: Self-pay | Admitting: Gynecology

## 2013-01-09 ENCOUNTER — Other Ambulatory Visit: Payer: Self-pay

## 2013-05-05 ENCOUNTER — Other Ambulatory Visit: Payer: Self-pay | Admitting: Internal Medicine

## 2013-05-05 NOTE — Telephone Encounter (Signed)
Scheduled CPX 06/2013

## 2013-06-09 ENCOUNTER — Other Ambulatory Visit (HOSPITAL_COMMUNITY): Payer: Self-pay | Admitting: Gynecology

## 2013-06-09 DIAGNOSIS — Z1231 Encounter for screening mammogram for malignant neoplasm of breast: Secondary | ICD-10-CM

## 2013-07-12 ENCOUNTER — Ambulatory Visit (HOSPITAL_COMMUNITY)
Admission: RE | Admit: 2013-07-12 | Discharge: 2013-07-12 | Disposition: A | Discharge status: Home / Self Care | Payer: 59 | Source: Ambulatory Visit | Attending: Gynecology | Admitting: Gynecology

## 2013-07-12 DIAGNOSIS — Z1231 Encounter for screening mammogram for malignant neoplasm of breast: Secondary | ICD-10-CM | POA: Insufficient documentation

## 2013-07-14 ENCOUNTER — Encounter: Payer: Self-pay | Admitting: Internal Medicine

## 2013-07-14 ENCOUNTER — Ambulatory Visit (INDEPENDENT_AMBULATORY_CARE_PROVIDER_SITE_OTHER): Payer: 59 | Admitting: Internal Medicine

## 2013-07-14 VITALS — BP 128/80 | HR 103 | Temp 98.6°F | Resp 12 | Ht 64.0 in | Wt 195.0 lb

## 2013-07-14 DIAGNOSIS — Z0000 Encounter for general adult medical examination without abnormal findings: Secondary | ICD-10-CM

## 2013-07-14 DIAGNOSIS — E785 Hyperlipidemia, unspecified: Secondary | ICD-10-CM

## 2013-07-14 DIAGNOSIS — E559 Vitamin D deficiency, unspecified: Secondary | ICD-10-CM | POA: Insufficient documentation

## 2013-07-14 DIAGNOSIS — M2550 Pain in unspecified joint: Secondary | ICD-10-CM

## 2013-07-14 DIAGNOSIS — Z1331 Encounter for screening for depression: Secondary | ICD-10-CM

## 2013-07-14 MED ORDER — AMLODIPINE BESY-BENAZEPRIL HCL 5-20 MG PO CAPS: ORAL_CAPSULE | ORAL | Status: DC

## 2013-07-14 MED ORDER — TRAMADOL HCL 50 MG PO TABS: 50.0000 mg | ORAL_TABLET | Freq: Three times a day (TID) | ORAL | Status: DC | PRN

## 2013-07-14 MED ORDER — TRIAMTERENE-HCTZ 37.5-25 MG PO CAPS: ORAL_CAPSULE | ORAL | Status: DC

## 2013-07-14 NOTE — Progress Notes (Signed)
  Subjective:    Patient ID: Joan [name], female    DOB: 1956/06/01, 57 y.o.   MRN: [id number]  HPI She  is here for a physical;acute issues include migratory arthralgias ; especially L hip, intrascapular area , R LS area. Some response to NSAIDS.      Review of Systems  She is on a heart healthy diet; she is not  Exercising due to MS symptoms.She denies chest pain, palpitations, dyspnea, or claudication.  Family history is positive for premature coronary disease in her brother @ 72. Advanced cholesterol testing reveals her LDL goal is less than 100. There is  medication compliance with the statin; but taken 2 times per week. Significant abdominal symptoms or memory deficit denied.  Labs performed by Dr Talmage Nap. .      Objective:   Physical Exam  Gen.:  well-nourished in appearance. Alert, appropriate and cooperative throughout exam.Appears younger than stated age  Head: Normocephalic without obvious abnormalities  Eyes: No corneal or conjunctival inflammation noted.  Extraocular motion intact. Vision grossly normal without lenses Ears: External  ear exam reveals no significant lesions or deformities. Canals clear .TMs normal. Hearing is grossly normal bilaterally. Nose: External nasal exam reveals no deformity or inflammation. Nasal mucosa are pink and moist. No lesions or exudates noted.   Mouth: Oral mucosa and oropharynx reveal no lesions or exudates. Teeth in good repair. Neck: No deformities, masses, or tenderness noted. Range of motion decreased. Thyroid asymmetric , R lobe > L.. Lungs: Normal respiratory effort; chest expands symmetrically. Lungs are clear to auscultation without rales, wheezes, or increased work of breathing. Heart: Normal rate and rhythm. Normal S1 and S2. No gallop, click, or rub. S4 w/o murmur. Abdomen: Bowel sounds normal; abdomen soft and nontender. No masses, organomegaly or hernias noted. Genitalia:As per Gyn                                   Musculoskeletal/extremities: Accentuated curvature of upper thoracic  Spine. No clubbing, cyanosis, edema, or significant extremity  deformity noted. Range of motion normal .Tone & strength  Normal. Joints normal . Nail health good. Crepitus knees w/o effusion Able to lie down & sit up w/o help. Negative SLR bilaterally but some pain L LS area Vascular: Carotid, radial artery, dorsalis pedis and  posterior tibial pulses are full and equal. No bruits present. Neurologic: Alert and oriented x3. Deep tendon reflexes symmetrical and normal.        Skin: Intact without suspicious lesions or rashes. Lymph: No cervical, axillary lymphadenopathy present. Psych: Mood and affect are normal. Normally interactive                                                                                        Assessment & Plan:  #1 comprehensive physical exam; no acute findings  Plan: see Orders  & Recommendations

## 2013-07-14 NOTE — Patient Instructions (Addendum)
The best exercises for the low back include freestyle swimming, stretch aerobics, and yoga. 

## 2013-07-15 LAB — SEDIMENTATION RATE: Sed Rate: 14 mm/hr (ref 0–22)

## 2013-07-19 ENCOUNTER — Encounter: Payer: Self-pay | Admitting: Internal Medicine

## 2013-09-30 ENCOUNTER — Other Ambulatory Visit: Payer: Self-pay

## 2013-10-15 ENCOUNTER — Encounter: Payer: Self-pay | Admitting: Internal Medicine

## 2013-10-25 ENCOUNTER — Emergency Department (HOSPITAL_COMMUNITY)
Admission: EM | Admit: 2013-10-25 | Discharge: 2013-10-25 | Disposition: A | Discharge status: Home / Self Care | Payer: 59 | Source: Home / Self Care | Attending: Family Medicine | Admitting: Family Medicine

## 2013-10-25 ENCOUNTER — Encounter (HOSPITAL_COMMUNITY): Payer: Self-pay | Admitting: Emergency Medicine

## 2013-10-25 DIAGNOSIS — J329 Chronic sinusitis, unspecified: Secondary | ICD-10-CM

## 2013-10-25 DIAGNOSIS — J069 Acute upper respiratory infection, unspecified: Secondary | ICD-10-CM

## 2013-10-25 MED ORDER — METHYLPREDNISOLONE 4 MG PO KIT: PACK | ORAL | Status: DC

## 2013-10-25 MED ORDER — DOXYCYCLINE HYCLATE 100 MG PO CAPS: 100.0000 mg | ORAL_CAPSULE | Freq: Two times a day (BID) | ORAL | Status: DC

## 2013-10-25 MED ORDER — FLUTICASONE PROPIONATE 50 MCG/ACT NA SUSP: 2.0000 | Freq: Two times a day (BID) | NASAL | Status: DC

## 2013-10-25 NOTE — ED Provider Notes (Signed)
CSN: [id number]     Arrival date & time 10/25/13  1307 History   First MD Initiated Contact with Patient 10/25/13 1426     Chief Complaint  Patient presents with  . URI   (Consider location/radiation/quality/duration/timing/severity/associated sxs/prior Treatment) HPI Comments: 57 year old female presents complaining of right-sided sinus pressure, swelling around her eye, and nasal congestion. This began originally about 10 days ago with a mild cold, she experienced mild sore throat cough. This resolved after about 3 days, but then she began to experience the nasal congestion and sinus pressure. Sinus pressure has been getting worse for 2 days. She denies any fever, chills, NVD, cough, sore throat at this time. She is taking numerous over-the-counter medications without any relief in her symptoms.  Patient is a 57 y.o. female presenting with URI.  URI Presenting symptoms: congestion   Presenting symptoms: no cough (resolved) and no fever   Associated symptoms: no arthralgias and no myalgias     Past Medical History  Diagnosis Date  . Hypertension     initially after BCP initiated   . Anemia     due to Dysmenorrhea  . Thyroid disease 2005 & 2009    nodule   Past Surgical History  Procedure Laterality Date  . Flexible sigmoidoscopy  1985     LLQ pain due to ovarian cyst  . Colonoscopy  2009    due 2015; Dr [facility]  . Abdominal hysterectomy  1998    for heavy menses and fibroids  . Tonsillectomy    . Biopsy thyroid  2005,2009    both negative  . Exploratory laparotomy  1985    Ovarian cyst   Family History  Problem Relation Age of Onset  . Hypertension Father   . Kidney failure Father     Bright's Disease; died @ 21  . Hypertension Mother   . Hyperlipidemia Mother   . Osteoarthritis Mother   . Hypertension Brother   . Diabetes Brother   . Stroke Maternal Grandmother     in 76s  . Hypertension Maternal Aunt     also DJD/ OA  . Breast cancer Maternal Aunt     . Heart attack Maternal Grandfather 37    also  CVA  . Heart block Paternal Grandmother     pacemaker   History  Substance Use Topics  . Smoking status: Never Smoker   . Smokeless tobacco: Not on file  . Alcohol Use: No   OB History   Grav Para Term Preterm Abortions TAB SAB Ect Mult Living                 Review of Systems  Constitutional: Negative for fever and chills.  HENT: Positive for congestion, facial swelling and sinus pressure.   Eyes: Negative for visual disturbance.  Respiratory: Negative for cough (resolved) and shortness of breath.   Cardiovascular: Negative for chest pain, palpitations and leg swelling.  Gastrointestinal: Negative for nausea, vomiting and abdominal pain.  Endocrine: Negative for polydipsia and polyuria.  Genitourinary: Negative for dysuria, urgency and frequency.  Musculoskeletal: Negative for arthralgias and myalgias.  Skin: Negative for rash.  Neurological: Negative for dizziness, weakness and light-headedness.    Allergies  Sulfonamide derivatives; Codeine; and Tramadol  Home Medications   Current Outpatient Rx  Name  Route  Sig  Dispense  Refill  . amLODipine-benazepril (LOTREL) 5-20 MG per capsule      TAKE 1 CAPSULE BY MOUTH ONCE DAILY   90 capsule   3   .  calcium carbonate (OS-CAL) 600 MG TABS tablet   Oral   Take 600 mg by mouth daily. TAKES INFREQUENTLY         . Cholecalciferol (VITAMIN D3) 2000 UNITS TABS   Oral   Take by mouth daily.         Marland Kitchen estradiol (VIVELLE-DOT) 0.025 MG/24HR   Transdermal   Place 1 patch onto the skin 2 (two) times a week.           . levothyroxine (SYNTHROID, LEVOTHROID) 88 MCG tablet   Oral   Take 88 mcg by mouth daily before breakfast.         . rosuvastatin (CRESTOR) 5 MG tablet   Oral   Take 5 mg by mouth. 2-3 times weekly         . traMADol (ULTRAM) 50 MG tablet   Oral   Take 1 tablet (50 mg total) by mouth every 8 (eight) hours as needed for pain.   30 tablet   1    . triamterene-hydrochlorothiazide (DYAZIDE) 37.5-25 MG per capsule      1 by mouth   90 capsule   3   . Vitamin D, Ergocalciferol, (DRISDOL) 50000 UNITS CAPS capsule   Oral   Take 50,000 Units by mouth every 7 (seven) days.         Marland Kitchen doxycycline (VIBRAMYCIN) 100 MG capsule   Oral   Take 1 capsule (100 mg total) by mouth 2 (two) times daily.   14 capsule   0   . fluticasone (FLONASE) 50 MCG/ACT nasal spray   Each Nare   Place 2 sprays into both nostrils 2 (two) times daily.   1 g   2   . methylPREDNISolone (MEDROL DOSEPAK) 4 MG tablet      follow package directions   21 tablet   0     Dispense as written.    BP 140/85  Pulse 87  Temp(Src) 97.9 F (36.6 C) (Oral)  Resp 16  SpO2 98% Physical Exam  Nursing note and vitals reviewed. Constitutional: She is oriented to person, place, and time. Vital signs are normal. She appears well-developed and well-nourished. No distress.  HENT:  Head: Normocephalic and atraumatic. Head is without right periorbital erythema and without left periorbital erythema.    Right Ear: External ear normal.  Left Ear: External ear normal.  Nose: Nose normal.  Mouth/Throat: Oropharynx is clear and moist. No oropharyngeal exudate.  Eyes: Conjunctivae and EOM are normal. Pupils are equal, round, and reactive to light. Right eye exhibits no discharge. Left eye exhibits no discharge. No scleral icterus.  Neck: Normal range of motion. Neck supple.  Cardiovascular: Normal rate, regular rhythm and normal heart sounds.  Exam reveals no gallop and no friction rub.   No murmur heard. Pulmonary/Chest: Effort normal. No respiratory distress. She has no wheezes. She has no rales.  Lymphadenopathy:    She has no cervical adenopathy.  Neurological: She is alert and oriented to person, place, and time. She has normal strength. Coordination normal.  Skin: Skin is warm and dry. No rash noted. She is not diaphoretic.  Psychiatric: She has a normal mood and  affect. Judgment normal.    ED Course  Procedures (including critical care time) Labs Review Labs Reviewed - No data to display Imaging Review No results found.    MDM   1. URI (upper respiratory infection)   2. Sinusitis    Continue taking Mucinex as needed in addition to prescriptions. Given strict  return precautions. Followup as needed  Meds ordered this encounter  Medications  . methylPREDNISolone (MEDROL DOSEPAK) 4 MG tablet    Sig: follow package directions    Dispense:  21 tablet    Refill:  0    Order Specific Question:  Supervising Provider    Answer:  Lorenz Coaster, DAVID C [id number]  . fluticasone (FLONASE) 50 MCG/ACT nasal spray    Sig: Place 2 sprays into both nostrils 2 (two) times daily.    Dispense:  1 g    Refill:  2    Order Specific Question:  Supervising Provider    Answer:  Lorenz Coaster, DAVID C [id number]  . doxycycline (VIBRAMYCIN) 100 MG capsule    Sig: Take 1 capsule (100 mg total) by mouth 2 (two) times daily.    Dispense:  14 capsule    Refill:  0    Order Specific Question:  Supervising Provider    Answer:  Lorenz Coaster, DAVID C [[id number]]     Graylon Good, PA-C 10/25/13 [phone]

## 2013-10-25 NOTE — ED Notes (Signed)
Pt triaged and assessed by Provider. Provider in before nurse.  

## 2013-10-26 ENCOUNTER — Ambulatory Visit: Payer: 59 | Admitting: Internal Medicine

## 2013-10-27 NOTE — ED Provider Notes (Signed)
Medical screening examination/treatment/procedure(s) were performed by resident physician or non-physician practitioner and as supervising physician I was immediately available for consultation/collaboration.   Avabella Wailes DOUGLAS MD.   Kemp Gomes D Neosha Switalski, MD 10/27/13 1910 

## 2014-01-19 ENCOUNTER — Ambulatory Visit (INDEPENDENT_AMBULATORY_CARE_PROVIDER_SITE_OTHER): Payer: 59 | Admitting: Internal Medicine

## 2014-01-19 ENCOUNTER — Ambulatory Visit: Payer: 59

## 2014-01-19 ENCOUNTER — Encounter: Payer: Self-pay | Admitting: Internal Medicine

## 2014-01-19 VITALS — BP 136/94 | HR 95 | Temp 97.6°F | Resp 12 | Wt 198.6 lb

## 2014-01-19 DIAGNOSIS — J019 Acute sinusitis, unspecified: Secondary | ICD-10-CM

## 2014-01-19 MED ORDER — AMOXICILLIN-POT CLAVULANATE 500-125 MG PO TABS: 1.0000 | ORAL_TABLET | Freq: Three times a day (TID) | ORAL | Status: DC

## 2014-01-19 NOTE — Progress Notes (Signed)
   Subjective:    Patient ID: Joan [name], female    DOB: 1956/10/16, 58 y.o.   MRN: [id number]  HPI In December was treated for sinusitis with doxycyline, prednisone, Flonase. Reports nasal congestion never subsided, wheezing subsided; she continues to have nasal congestion, with green/blood-tinged nasal secretions. Reports frontal HA.  She works as an Therapist, sports at [employer].   Review of Systems Denies cough, fever, chills, sweats.     Objective:   Physical Exam General appearance:good health ;well nourished; no acute distress or increased work of breathing is present. No lymphadenopathy about the head, neck, or axilla noted.  Eyes: No conjunctival inflammation or lid edema is present.  Ears: External ear exam shows no significant lesions or deformities. Otoscopic examination reveals clear canals, tympanic membranes are intact bilaterally without bulging, retraction, inflammation or discharge.  Nose: External nasal examination shows no deformity or inflammation. Nasal mucosa are pink and moist with exudate noted bilaterally.  No septal dislocation or deviation.No obstruction to airflow.  Oral exam: Dental hygiene is good; lips and gums are healthy appearing.There is no oropharyngeal erythema or exudate noted.  Neck: No deformities,  masses, or tenderness noted. Supple with full range of motion without pain.  Heart: Normal rate and regular rhythm. S1 and S2 normal without gallop, murmur, click, rub or other extra sounds.  Lungs:Chest clear to auscultation; no wheezes, rhonchi,rales ,or rubs present.No increased work of breathing.  Extremities: No cyanosis, edema, or clubbing noted  Skin: Warm & dry       Assessment & Plan:  #1 sinusitis  See orders

## 2014-01-19 NOTE — Progress Notes (Signed)
Pre visit review using our clinic review tool, if applicable. No additional management support is needed unless otherwise documented below in the visit note. 

## 2014-01-19 NOTE — Progress Notes (Signed)
   Subjective:    Patient ID: Joan [name], female    DOB: December 20, 1955, 58 y.o.   MRN: [id number]  HPI In December was treated for sinusitis with doxycyline, prednisone, Flonase at Urgent Care. Reports nasal congestion never subsided except for wheezing; she continues to have nasal congestion, with green/blood-tinged nasal secretions. Reports frontal HA. Patient works as an Therapist, sports at [employer].  Review of Systems Denies cough, fever, chills, sweats.    Objective:   Physical Exam General appearance:good health ;well nourished; no acute distress or increased work of breathing is present.  No  lymphadenopathy about the head, neck, or axilla noted.   Eyes: No conjunctival inflammation or lid edema is present. There is no scleral icterus.  Ears:  External ear exam shows no significant lesions or deformities.  Otoscopic examination reveals clear canals, tympanic membranes are intact bilaterally without bulging, retraction, inflammation or discharge.  Nose:  External nasal examination shows no deformity or inflammation. Nasal mucosa are pink and moist without lesions or exudates. No septal dislocation or deviation.No obstruction to airflow.   Oral exam: Dental hygiene is good; lips and gums are healthy appearing.There is no oropharyngeal erythema or exudate noted.   Neck:  No deformities, thyromegaly, masses, or tenderness noted.   Supple with full range of motion without pain.   Heart:  Normal rate and regular rhythm. S1 and S2 normal without gallop, murmur, click, rub or other extra sounds.   Lungs:Chest clear to auscultation; no wheezes, rhonchi,rales ,or rubs present.No increased work of breathing.    Extremities:  No cyanosis, edema, or clubbing  noted    Skin: Warm & dry w/o jaundice or tenting.        Assessment & Plan:  #1 sinusitis

## 2014-01-19 NOTE — Patient Instructions (Signed)

## 2014-01-21 ENCOUNTER — Ambulatory Visit: Payer: 59 | Attending: Orthopedic Surgery

## 2014-01-21 ENCOUNTER — Ambulatory Visit: Payer: 59

## 2014-01-21 DIAGNOSIS — R5381 Other malaise: Secondary | ICD-10-CM | POA: Insufficient documentation

## 2014-01-21 DIAGNOSIS — IMO0001 Reserved for inherently not codable concepts without codable children: Secondary | ICD-10-CM | POA: Insufficient documentation

## 2014-01-21 DIAGNOSIS — M25659 Stiffness of unspecified hip, not elsewhere classified: Secondary | ICD-10-CM | POA: Insufficient documentation

## 2014-01-21 DIAGNOSIS — M25559 Pain in unspecified hip: Secondary | ICD-10-CM | POA: Insufficient documentation

## 2014-01-21 DIAGNOSIS — I10 Essential (primary) hypertension: Secondary | ICD-10-CM | POA: Insufficient documentation

## 2014-01-25 ENCOUNTER — Ambulatory Visit: Payer: 59 | Attending: Orthopedic Surgery

## 2014-01-25 DIAGNOSIS — M25659 Stiffness of unspecified hip, not elsewhere classified: Secondary | ICD-10-CM | POA: Insufficient documentation

## 2014-01-25 DIAGNOSIS — M25559 Pain in unspecified hip: Secondary | ICD-10-CM | POA: Insufficient documentation

## 2014-01-25 DIAGNOSIS — IMO0001 Reserved for inherently not codable concepts without codable children: Secondary | ICD-10-CM | POA: Insufficient documentation

## 2014-01-25 DIAGNOSIS — R5381 Other malaise: Secondary | ICD-10-CM | POA: Insufficient documentation

## 2014-01-25 DIAGNOSIS — I10 Essential (primary) hypertension: Secondary | ICD-10-CM | POA: Insufficient documentation

## 2014-01-27 ENCOUNTER — Ambulatory Visit: Payer: 59 | Admitting: Physical Therapy

## 2014-01-28 ENCOUNTER — Ambulatory Visit: Payer: 59 | Admitting: Physical Therapy

## 2014-02-01 ENCOUNTER — Ambulatory Visit: Payer: 59

## 2014-02-02 ENCOUNTER — Ambulatory Visit: Payer: 59 | Admitting: Physical Therapy

## 2014-02-08 ENCOUNTER — Ambulatory Visit: Payer: 59

## 2014-02-11 ENCOUNTER — Ambulatory Visit: Payer: 59 | Admitting: Physical Therapy

## 2014-02-14 ENCOUNTER — Ambulatory Visit: Payer: 59 | Admitting: Physical Therapy

## 2014-02-18 ENCOUNTER — Ambulatory Visit: Payer: 59 | Admitting: Physical Therapy

## 2014-02-21 ENCOUNTER — Ambulatory Visit: Payer: 59

## 2014-02-24 ENCOUNTER — Ambulatory Visit: Payer: 59 | Attending: Orthopedic Surgery | Admitting: Physical Therapy

## 2014-02-24 DIAGNOSIS — R5381 Other malaise: Secondary | ICD-10-CM | POA: Insufficient documentation

## 2014-02-24 DIAGNOSIS — M25559 Pain in unspecified hip: Secondary | ICD-10-CM | POA: Insufficient documentation

## 2014-02-24 DIAGNOSIS — I10 Essential (primary) hypertension: Secondary | ICD-10-CM | POA: Insufficient documentation

## 2014-02-24 DIAGNOSIS — M25659 Stiffness of unspecified hip, not elsewhere classified: Secondary | ICD-10-CM | POA: Insufficient documentation

## 2014-02-24 DIAGNOSIS — IMO0001 Reserved for inherently not codable concepts without codable children: Secondary | ICD-10-CM | POA: Insufficient documentation

## 2014-03-01 ENCOUNTER — Ambulatory Visit: Payer: 59 | Admitting: Physical Therapy

## 2014-03-03 ENCOUNTER — Ambulatory Visit: Payer: 59 | Admitting: *Deleted

## 2014-03-03 ENCOUNTER — Ambulatory Visit: Payer: 59 | Admitting: Physical Therapy

## 2014-03-07 ENCOUNTER — Ambulatory Visit: Payer: 59 | Admitting: Physical Therapy

## 2014-03-08 ENCOUNTER — Ambulatory Visit: Payer: 59 | Admitting: Physical Therapy

## 2014-03-17 ENCOUNTER — Encounter: Payer: 59 | Attending: Orthopedic Surgery | Admitting: *Deleted

## 2014-03-17 ENCOUNTER — Encounter: Payer: Self-pay | Admitting: *Deleted

## 2014-03-17 VITALS — Ht 64.0 in | Wt 200.2 lb

## 2014-03-17 DIAGNOSIS — Z713 Dietary counseling and surveillance: Secondary | ICD-10-CM | POA: Insufficient documentation

## 2014-03-17 DIAGNOSIS — Z6834 Body mass index (BMI) 34.0-34.9, adult: Secondary | ICD-10-CM | POA: Insufficient documentation

## 2014-03-17 DIAGNOSIS — E669 Obesity, unspecified: Secondary | ICD-10-CM | POA: Insufficient documentation

## 2014-03-17 DIAGNOSIS — M76899 Other specified enthesopathies of unspecified lower limb, excluding foot: Secondary | ICD-10-CM | POA: Insufficient documentation

## 2014-03-17 DIAGNOSIS — R635 Abnormal weight gain: Secondary | ICD-10-CM | POA: Insufficient documentation

## 2014-03-17 NOTE — Patient Instructions (Addendum)
Plan:  Aim for 3 Carb Choices per meal (45 grams) +/- 1 either way  Aim for 0-2 Carbs per snack if hungry  Include protein in moderation with your meals and snacks Consider reading food labels for Total Carbohydrate of foods Continue with your activity level daily as tolerated

## 2014-03-17 NOTE — Progress Notes (Signed)
  Medical Nutrition Therapy:  Appt start time: 4098 end time:  1630.  Assessment:  Primary concerns today: patient would like assistance with weight management. 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  She tries to work out and has [employer] so she is walking more, but can be painful with her hip bursitis problems sometimes. She and her husband eat out often or bring food home.  She states she has gained weight mostly after she started on thyroid meds.  Preferred Learning Style: Auditory, Visual and Hands on  Learning Readiness:   Ready  Change in progress  MEDICATIONS: see list   DIETARY INTAKE:  24-hr recall:  B ( AM): eats around thyroid medication: cereal with fruit OR oatmeal OR scrambled eggs and bacon on weekends OR cottage cheese and fresh fruit OR yogurt with granola and fresh fruit    Snk ( AM): no  L ( PM): left overs from home OR sandwich with chips and a cookie Snk ( PM): no D ( PM): omelets for dinner on Friday nights OR [employer] brought home OR left overs with baked potato and a vegetable, meats are pan fried in olive oil,occasionally red meat Snk ( PM): no Beverages:   Usual physical activity: she typically walks as able, but has pain in lower back and bursitis flairs up. She has worked out at [employer] on occasion.   Estimated energy needs: 1400 calories 158 g carbohydrates 105 g protein 39 g fat  Progress Towards Goal(s):  In progress.   Nutritional Diagnosis:  NB-1.1 Food and nutrition-related knowledge deficit As related to weight control.  As evidenced by BMI of 34.4.    Intervention:  Nutrition counseling and diabetes education initiated. Discussed Carb Counting as method of portion control, reading food labels, and benefits of increased activity. Discussed options for increasing activity level without aggravating her bursitis pain, such as water or arm chair  Exercises.  Answered questions regarding food  shopping and efficient preparation of meals.  Plan:  Aim for 3 Carb Choices per meal (45 grams) +/- 1 either way  Aim for 0-2 Carbs per snack if hungry  Include protein in moderation with your meals and snacks Consider reading food labels for Total Carbohydrate of foods Continue with your activity level daily as tolerated  Teaching Method Utilized: Visual. Auditory and Hands on  Handouts given during visit include: Carb Counting and Food Label handouts Meal Plan Card  Barriers to learning/adherence to lifestyle change: very busy work schedule  Demonstrated degree of understanding via:  Teach Back   Monitoring/Evaluation:  Dietary intake, exercise, reading food labels, and body weight in 1 month(s).

## 2014-04-29 ENCOUNTER — Ambulatory Visit: Payer: 59 | Admitting: *Deleted

## 2014-06-20 ENCOUNTER — Other Ambulatory Visit (HOSPITAL_COMMUNITY): Payer: Self-pay | Admitting: Gynecology

## 2014-06-20 DIAGNOSIS — Z1231 Encounter for screening mammogram for malignant neoplasm of breast: Secondary | ICD-10-CM

## 2014-06-24 ENCOUNTER — Ambulatory Visit: Payer: 59 | Admitting: *Deleted

## 2014-08-02 ENCOUNTER — Other Ambulatory Visit: Payer: Self-pay | Admitting: Internal Medicine

## 2014-08-10 ENCOUNTER — Ambulatory Visit: Payer: 59 | Admitting: *Deleted

## 2014-08-11 ENCOUNTER — Ambulatory Visit (HOSPITAL_COMMUNITY): Payer: 59 | Attending: Gynecology

## 2014-09-09 ENCOUNTER — Other Ambulatory Visit: Payer: Self-pay

## 2015-01-11 ENCOUNTER — Other Ambulatory Visit (HOSPITAL_COMMUNITY): Payer: Self-pay | Admitting: Gynecology

## 2015-01-11 ENCOUNTER — Ambulatory Visit (HOSPITAL_COMMUNITY)
Admission: RE | Admit: 2015-01-11 | Discharge: 2015-01-11 | Disposition: A | Discharge status: Home / Self Care | Payer: 59 | Source: Ambulatory Visit | Attending: Gynecology | Admitting: Gynecology

## 2015-01-11 DIAGNOSIS — Z1231 Encounter for screening mammogram for malignant neoplasm of breast: Secondary | ICD-10-CM | POA: Diagnosis not present

## 2015-01-27 ENCOUNTER — Telehealth: Payer: Self-pay | Admitting: Internal Medicine

## 2015-01-27 ENCOUNTER — Other Ambulatory Visit: Payer: Self-pay | Admitting: Internal Medicine

## 2015-01-27 DIAGNOSIS — M8580 Other specified disorders of bone density and structure, unspecified site: Secondary | ICD-10-CM

## 2015-01-27 NOTE — Telephone Encounter (Signed)
Attempted to notify patient on phone # she left. No answer. Went straight to [employer]. Voicemail not set up. Order for dexa scan placed and she will get a call from [facility]

## 2015-01-27 NOTE — Telephone Encounter (Signed)
Pt called in would like to see if Dr hoppe could put order in for her to get a Dexa Scan.  Last one she said was 3 or 4 years ago   Best number [phone]

## 2015-01-27 NOTE — Telephone Encounter (Signed)
Order entered

## 2015-02-27 ENCOUNTER — Emergency Department (HOSPITAL_COMMUNITY)
Admission: EM | Admit: 2015-02-27 | Discharge: 2015-02-27 | Disposition: A | Discharge status: Home / Self Care | Payer: 59 | Source: Home / Self Care | Attending: Family Medicine | Admitting: Family Medicine

## 2015-02-27 ENCOUNTER — Encounter (HOSPITAL_COMMUNITY): Payer: Self-pay | Admitting: Emergency Medicine

## 2015-02-27 DIAGNOSIS — J302 Other seasonal allergic rhinitis: Secondary | ICD-10-CM

## 2015-02-27 LAB — POCT RAPID STREP A: Streptococcus, Group A Screen (Direct): NEGATIVE

## 2015-02-27 MED ORDER — FLUTICASONE PROPIONATE 50 MCG/ACT NA SUSP: 1.0000 | Freq: Two times a day (BID) | NASAL | Status: DC

## 2015-02-27 MED ORDER — CETIRIZINE HCL 10 MG PO TABS: 10.0000 mg | ORAL_TABLET | Freq: Every day | ORAL | Status: DC

## 2015-02-27 NOTE — ED Provider Notes (Signed)
CSN: [id number]     Arrival date & time 02/27/15  1747 History   First MD Initiated Contact with Patient 02/27/15 1844     Chief Complaint  Patient presents with  . Sore Throat   (Consider location/radiation/quality/duration/timing/severity/associated sxs/prior Treatment) Patient is a 59 y.o. female presenting with pharyngitis. The history is provided by the patient.  Sore Throat This is a new problem. The current episode started 12 to 24 hours ago. The problem has been gradually worsening. The symptoms are aggravated by swallowing.    Past Medical History  Diagnosis Date  . Hypertension     initially after BCP initiated   . Anemia     due to Dysmenorrhea  . Thyroid disease 2005 & 2009    nodule   Past Surgical History  Procedure Laterality Date  . Flexible sigmoidoscopy  1985     LLQ pain due to ovarian cyst  . Colonoscopy  2009    due 2015; Dr Annette [facility]  . Abdominal hysterectomy  1998    for heavy menses and fibroids  . Tonsillectomy    . Biopsy thyroid  2005,2009    both negative  . Exploratory laparotomy  1985    Ovarian cyst   Family History  Problem Relation Age of Onset  . Hypertension Father   . Kidney failure Father     Bright's Disease; died @ 55  . Hypertension Mother   . Hyperlipidemia Mother   . Osteoarthritis Mother   . Hypertension Brother   . Diabetes Brother   . Stroke Maternal Grandmother     in 33s  . Hypertension Maternal Aunt     also DJD/ OA  . Breast cancer Maternal Aunt   . Heart attack Maternal Grandfather 69    also  CVA  . Heart block Paternal Grandmother     pacemaker   History  Substance Use Topics  . Smoking status: Never Smoker   . Smokeless tobacco: Not on file  . Alcohol Use: No   OB History    No data available     Review of Systems  Constitutional: Negative.   HENT: Positive for congestion, postnasal drip, rhinorrhea and sore throat.   Respiratory: Negative.  Negative for wheezing.   Cardiovascular:  Negative.     Allergies  Sulfonamide derivatives; Codeine; and Tramadol  Home Medications   Prior to Admission medications   Medication Sig Start Date End Date Taking? Authorizing Provider  amLODipine-benazepril (LOTREL) 5-20 MG per capsule TAKE 1 CAPSULE BY MOUTH ONCE DAILY 08/02/14   Hendricks Limes, MD  amoxicillin-clavulanate (AUGMENTIN) 500-125 MG per tablet Take 1 tablet (500 mg total) by mouth 3 (three) times daily. 01/19/14   Hendricks Limes, MD  cetirizine (ZYRTEC) 10 MG tablet Take 1 tablet (10 mg total) by mouth daily. One tab daily for allergies 02/27/15   Billy Fischer, MD  Cholecalciferol (VITAMIN D3) 2000 UNITS TABS Take by mouth daily.    Historical Provider, MD  estradiol (VIVELLE-DOT) 0.025 MG/24HR Place 1 patch onto the skin 2 (two) times a week.      Historical Provider, MD  fluticasone (FLONASE) 50 MCG/ACT nasal spray Place 1 spray into both nostrils 2 (two) times daily. 02/27/15   Billy Fischer, MD  levothyroxine (SYNTHROID, LEVOTHROID) 88 MCG tablet Take 88 mcg by mouth daily before breakfast.    Historical Provider, MD  loratadine (CLARITIN) 10 MG tablet Take 10 mg by mouth daily.    Historical Provider, MD  rosuvastatin (  CRESTOR) 5 MG tablet Take 5 mg by mouth. 2-3 times weekly    Historical Provider, MD  triamterene-hydrochlorothiazide (DYAZIDE) 37.5-25 MG per capsule TAKE 1 CAPSULE BY MOUTH ONCE DAILY 08/02/14   Hendricks Limes, MD  Vitamin D, Ergocalciferol, (DRISDOL) 50000 UNITS CAPS capsule Take 50,000 Units by mouth every 7 (seven) days.    Historical Provider, MD   BP 130/84 mmHg  Pulse 93  Temp(Src) 98.4 F (36.9 C) (Oral)  Resp 16  SpO2 96% Physical Exam  Constitutional: She is oriented to person, place, and time. She appears well-developed and well-nourished. No distress.  HENT:  Head: Normocephalic.  Right Ear: External ear normal.  Left Ear: External ear normal.  Mouth/Throat: Uvula is midline and mucous membranes are normal. Posterior oropharyngeal  edema and posterior oropharyngeal erythema present. No tonsillar abscesses.  Neck: Normal range of motion. Neck supple.  Lymphadenopathy:    She has no cervical adenopathy.  Neurological: She is alert and oriented to person, place, and time.  Skin: Skin is warm and dry.  Nursing note and vitals reviewed.   ED Course  Procedures (including critical care time) Labs Review Labs Reviewed  RAPID STREP SCREEN  POCT RAPID STREP A ([facility] ONLY)   Strep neg. Imaging Review No results found.   MDM   1. Seasonal allergic rhinitis        Billy Fischer, MD 02/27/15 1911

## 2015-02-27 NOTE — ED Notes (Signed)
Pt. Stated, I think I picked up a sore throat from my granddaughter and I've had a bad sore throat all day.

## 2015-02-28 ENCOUNTER — Ambulatory Visit: Payer: 59 | Admitting: Internal Medicine

## 2015-03-02 LAB — CULTURE, GROUP A STREP: Strep A Culture: NEGATIVE

## 2015-03-06 ENCOUNTER — Other Ambulatory Visit (INDEPENDENT_AMBULATORY_CARE_PROVIDER_SITE_OTHER): Payer: 59

## 2015-03-06 ENCOUNTER — Ambulatory Visit (INDEPENDENT_AMBULATORY_CARE_PROVIDER_SITE_OTHER): Payer: 59 | Admitting: Family

## 2015-03-06 ENCOUNTER — Encounter: Payer: Self-pay | Admitting: Family

## 2015-03-06 VITALS — BP 132/80 | HR 117 | Temp 98.1°F | Resp 20 | Ht 64.0 in | Wt 199.4 lb

## 2015-03-06 DIAGNOSIS — R809 Proteinuria, unspecified: Secondary | ICD-10-CM | POA: Diagnosis not present

## 2015-03-06 DIAGNOSIS — R05 Cough: Secondary | ICD-10-CM | POA: Diagnosis not present

## 2015-03-06 DIAGNOSIS — R059 Cough, unspecified: Secondary | ICD-10-CM

## 2015-03-06 LAB — POCT URINALYSIS DIPSTICK
Bilirubin, UA: NEGATIVE
Blood, UA: NEGATIVE
Glucose, UA: NEGATIVE
Ketones, UA: NEGATIVE
Leukocytes, UA: NEGATIVE
Nitrite, UA: NEGATIVE
Spec Grav, UA: 1.02
Urobilinogen, UA: NEGATIVE
pH, UA: 6.5

## 2015-03-06 LAB — BASIC METABOLIC PANEL
BUN: 14 mg/dL (ref 6–23)
CHLORIDE: 97 meq/L (ref 96–112)
CO2: 34 meq/L — AB (ref 19–32)
CREATININE: 0.85 mg/dL (ref 0.40–1.20)
Calcium: 10.1 mg/dL (ref 8.4–10.5)
GFR: 72.23 mL/min (ref >60.00)
GLUCOSE: 117 mg/dL — AB (ref 70–99)
POTASSIUM: 3.3 meq/L — AB (ref 3.5–5.1)
Sodium: 137 mEq/L (ref 135–145)

## 2015-03-06 MED ORDER — LEVOFLOXACIN 500 MG PO TABS: 500.0000 mg | ORAL_TABLET | Freq: Every day | ORAL | Status: DC

## 2015-03-06 NOTE — Assessment & Plan Note (Signed)
Symptoms and exam consistent with sinusitis. Start Levaquin. Continue over-the-counter medications as needed for symptom relief and supportive care. Follow-up if symptoms worsen or fail to improve.

## 2015-03-06 NOTE — Assessment & Plan Note (Signed)
In office urinalysis shows trace protein. Obtain basic metabolic panel to rule out kidney pathology.

## 2015-03-06 NOTE — Progress Notes (Signed)
Pre visit review using our clinic review tool, if applicable. No additional management support is needed unless otherwise documented below in the visit note. 

## 2015-03-06 NOTE — Progress Notes (Signed)
Subjective:    Patient ID: Joan [name], female    DOB: 1956-08-13, 59 y.o.   MRN: [id number]  Chief Complaint  Patient presents with  . Sore Throat    x4 days, started off with a sore throat, throat still feels scratchy, having some congestion, wheezing, and sinus headache, also wanted a urine dip bc she had protien in her urine     HPI:  Joan [name] is a 59 y.o. female who presents today for an acute visit.   1) Illness - This is a new problem. Associated symptoms of sore throat, congestion, wheezing, and sinus headache has been going on for about a week. Been seen at Urgent Care and was diagnosed with allergic rhinitis and given Flonase. Indicates that there was minimal effects of the medication. Has also tried Mucinex and Dimetapp which also provided minimal relief. Denies recent antibiotic use.   2) Protein in urine - patient was recently seen in GYN office and was noted to have protein in her urine during urinalysis. Requesting a secondary urinalysis to confirm protein in her urine.   Allergies  Allergen Reactions  . Sulfonamide Derivatives     REACTION: RASH Medi Alert bracelet  is recommended    . Codeine Nausea Only  . Tramadol Nausea And Vomiting    Current Outpatient Prescriptions on File Prior to Visit  Medication Sig Dispense Refill  . amLODipine-benazepril (LOTREL) 5-20 MG per capsule TAKE 1 CAPSULE BY MOUTH ONCE DAILY 90 capsule 3  . amoxicillin-clavulanate (AUGMENTIN) 500-125 MG per tablet Take 1 tablet (500 mg total) by mouth 3 (three) times daily. 20 tablet 0  . cetirizine (ZYRTEC) 10 MG tablet Take 1 tablet (10 mg total) by mouth daily. One tab daily for allergies 30 tablet 1  . Cholecalciferol (VITAMIN D3) 2000 UNITS TABS Take by mouth daily.    Marland Kitchen estradiol (VIVELLE-DOT) 0.025 MG/24HR Place 1 patch onto the skin 2 (two) times a week.      . fluticasone (FLONASE) 50 MCG/ACT nasal spray Place 1 spray into both nostrils 2 (two) times daily. 1 g 2  .  levothyroxine (SYNTHROID, LEVOTHROID) 88 MCG tablet Take 88 mcg by mouth daily before breakfast.    . rosuvastatin (CRESTOR) 5 MG tablet Take 5 mg by mouth. 2-3 times weekly    . triamterene-hydrochlorothiazide (DYAZIDE) 37.5-25 MG per capsule TAKE 1 CAPSULE BY MOUTH ONCE DAILY 90 capsule 3  . Vitamin D, Ergocalciferol, (DRISDOL) 50000 UNITS CAPS capsule Take 50,000 Units by mouth every 7 (seven) days.     No current facility-administered medications on file prior to visit.    Review of Systems  Constitutional: Positive for chills. Negative for fever.  HENT: Positive for congestion, ear pain, sinus pressure and sore throat.   Respiratory: Positive for cough and wheezing.   Neurological: Positive for headaches.      Objective:    BP 132/80 mmHg  Pulse 117  Temp(Src) 98.1 F (36.7 C) (Oral)  Resp 20  Ht 5\' 4"  (1.626 m)  Wt 199 lb 6.4 oz (90.447 kg)  BMI 34.21 kg/m2  SpO2 96% Nursing note and vital signs reviewed.  Physical Exam  Constitutional: She is oriented to person, place, and time. She appears well-developed and well-nourished. No distress.  HENT:  Right Ear: Hearing, tympanic membrane, external ear and ear canal normal.  Left Ear: Hearing, tympanic membrane, external ear and ear canal normal.  Nose: Right sinus exhibits no maxillary sinus tenderness and no frontal sinus tenderness. Left  sinus exhibits no maxillary sinus tenderness and no frontal sinus tenderness.  Mouth/Throat: Uvula is midline, oropharynx is clear and moist and mucous membranes are normal.  Neck: Neck supple.  Cardiovascular: Normal rate, regular rhythm, normal heart sounds and intact distal pulses.   Pulmonary/Chest: Effort normal and breath sounds normal.  Neurological: She is alert and oriented to person, place, and time.  Skin: Skin is warm and dry.  Psychiatric: She has a normal mood and affect. Her behavior is normal. Judgment and thought content normal.       Assessment & Plan:

## 2015-03-06 NOTE — Patient Instructions (Addendum)
Thank you for choosing [facility].  Summary/Instructions:  Your prescription(s) have been submitted to your pharmacy or been printed and provided for you. Please take as directed and contact our office if you believe you are having problem(s) with the medication(s) or have any questions.  If your symptoms worsen or fail to improve, please contact our office for further instruction, or in case of emergency go directly to the emergency room at the closest medical facility.   General Recommendations:    Please drink plenty of fluids.  Get plenty of rest   Sleep in humidified air  Use saline nasal sprays  Netti pot   OTC Medications:  Decongestants - helps relieve congestion   Flonase (generic fluticasone) or Nasacort (generic triamcinolone) - please make sure to use the "cross-over" technique at a 45 degree angle towards the opposite eye as opposed to straight up the nasal passageway.   Sudafed (generic pseudoephedrine - Note this is the one that is available behind the pharmacy counter); Products with phenylephrine (-PE) may also be used but is often not as effective as pseudoephedrine.   If you have HIGH BLOOD PRESSURE - Coricidin HBP; AVOID any product that is -D as this contains pseudoephedrine which may increase your blood pressure.  Afrin (oxymetazoline) every 6-8 hours for up to 3 days.   Allergies - helps relieve runny nose, itchy eyes and sneezing   Claritin (generic loratidine), Allegra (fexofenidine), or Zyrtec (generic cyrterizine) for runny nose. These medications should not cause drowsiness.  Note - Benadryl (generic diphenhydramine) may be used however may cause drowsiness  Cough -   Delsym or Robitussin (generic dextromethorphan)  Expectorants - helps loosen mucus to ease removal   Mucinex (generic guaifenesin) as directed on the package.  Headaches / General Aches   Tylenol (generic acetaminophen) - DO NOT EXCEED 3 grams (3,000 mg) in a 24  hour time period  Advil/Motrin (generic ibuprofen)   Sore Throat -   Salt water gargle   Chloraseptic (generic benzocaine) spray or lozenges / Sucrets (generic dyclonine)      

## 2015-03-09 ENCOUNTER — Inpatient Hospital Stay: Admission: RE | Admit: 2015-03-09 | Payer: 59 | Source: Ambulatory Visit

## 2015-03-17 ENCOUNTER — Ambulatory Visit (INDEPENDENT_AMBULATORY_CARE_PROVIDER_SITE_OTHER)
Admission: RE | Admit: 2015-03-17 | Discharge: 2015-03-17 | Disposition: A | Discharge status: Home / Self Care | Payer: 59 | Source: Ambulatory Visit | Attending: Internal Medicine | Admitting: Internal Medicine

## 2015-03-17 DIAGNOSIS — M8580 Other specified disorders of bone density and structure, unspecified site: Secondary | ICD-10-CM

## 2015-06-01 ENCOUNTER — Other Ambulatory Visit (HOSPITAL_COMMUNITY): Payer: Self-pay | Admitting: Endocrinology

## 2015-06-01 DIAGNOSIS — E049 Nontoxic goiter, unspecified: Secondary | ICD-10-CM

## 2015-06-05 ENCOUNTER — Ambulatory Visit (HOSPITAL_COMMUNITY)
Admission: RE | Admit: 2015-06-05 | Discharge: 2015-06-05 | Disposition: A | Discharge status: Home / Self Care | Payer: 59 | Source: Ambulatory Visit | Attending: Endocrinology | Admitting: Endocrinology

## 2015-06-05 DIAGNOSIS — E079 Disorder of thyroid, unspecified: Secondary | ICD-10-CM | POA: Diagnosis present

## 2015-06-05 DIAGNOSIS — E049 Nontoxic goiter, unspecified: Secondary | ICD-10-CM

## 2015-06-05 DIAGNOSIS — E042 Nontoxic multinodular goiter: Secondary | ICD-10-CM | POA: Insufficient documentation

## 2015-06-30 ENCOUNTER — Encounter: Payer: Self-pay | Admitting: Internal Medicine

## 2015-06-30 ENCOUNTER — Ambulatory Visit (INDEPENDENT_AMBULATORY_CARE_PROVIDER_SITE_OTHER): Payer: 59 | Admitting: Internal Medicine

## 2015-06-30 VITALS — BP 120/88 | HR 104 | Temp 98.4°F | Resp 18 | Ht 64.0 in | Wt 204.0 lb

## 2015-06-30 DIAGNOSIS — E785 Hyperlipidemia, unspecified: Secondary | ICD-10-CM

## 2015-06-30 DIAGNOSIS — Z23 Encounter for immunization: Secondary | ICD-10-CM

## 2015-06-30 DIAGNOSIS — Z0000 Encounter for general adult medical examination without abnormal findings: Secondary | ICD-10-CM | POA: Diagnosis not present

## 2015-06-30 NOTE — Progress Notes (Signed)
Pre visit review using our clinic review tool, if applicable. No additional management support is needed unless otherwise documented below in the visit note. 

## 2015-06-30 NOTE — Patient Instructions (Signed)
Minimal Blood Pressure Goal= AVERAGE < 140/90;  Ideal is an AVERAGE < 135/85. This AVERAGE should be calculated from @ least 5-7 BP readings taken @ different times of day on different days of week. You should not respond to isolated BP readings , but rather the AVERAGE for that week .Please bring your  blood pressure cuff to office visits to verify that it is reliable.It  can also be checked against the blood pressure device at the pharmacy. Finger or wrist cuffs are not dependable; an arm cuff is.  As per the Standard of Care , screening Colonoscopy recommended @ 50 & every 5-10 years thereafter . More frequent monitor would be dictated by family history or findings @ Colonoscopy. Check with  Dr Cristina Gong 's office as to when to repeat this.    Please follow a Mediaterranean type diet  (many good cook books readily available) or review Dr Nunzio Cory book Eat, [place] for best  dietary cholesterol information & options.  Cardiovascular exercise, this can be as simple a program as walking, is recommended 30-45 minutes 3-4 times per week. If you're not exercising you should take 6-8 weeks to build up to this level. The best exercises for the low back include freestyle swimming, stretch aerobics, and yoga.

## 2015-06-30 NOTE — Progress Notes (Signed)
   Subjective:    Patient ID: Joan [name], female    DOB: 03/01/1956, 59 y.o.   MRN: [id number]  HPI She is here for a physical;acute issues include chronic back problems which she sees a Restaurant manager, fast food. This has precluded exercise.  She avoids excess red meat and fried foods but does eat salt. She has been compliant with her medicines without adverse effects. She's not monitoring blood pressure on a regular basis. Dr. Chalmers Cater, her Endocrinologist has been monitoring labs. Her TSH was 1.97 on the present dose of thyroid supplement. She takes Crestor 5 mg every other day. HDL was 56, LDL 96, triglycerides 136.BMET & vit D were normal.  Although she's not exercising she has no cardiopulmonary symptoms except for intermittent edema. Last week at the beach she had edema but this is not a persistent phenomena.  She has had significant weight gain in the last year since she's not been exercising.  Colonoscopy is up-to-date and she denies any active GI symptoms.  Review of Systems  Chest pain, palpitations, tachycardia, exertional dyspnea, paroxysmal nocturnal dyspnea, or claudication are absent. No unexplained weight loss, abdominal pain, significant dyspepsia, dysphagia, melena, rectal bleeding, or persistently small caliber stools. Dysuria, pyuria, hematuria, frequency, nocturia or polyuria are denied. Change in hair, skin, nails denied. No bowel changes of constipation or diarrhea. No intolerance to heat or cold.      Objective:   Physical Exam  Pertinent or positive findings include: There is decreased range of motion of the cervical spine. The thyroid is asymmetric; the right lobe was larger than the left. No nodules are palpable. She has mild crepitus of the knees.  General appearance :adequately nourished; in no distress. BMI 35  Eyes: No conjunctival inflammation or scleral icterus is present.  Oral exam:  Lips and gums are healthy appearing.There is no oropharyngeal erythema or  exudate noted. Dental hygiene is good.  Heart:  Normal rate and regular rhythm. S1 and S2 normal without gallop, murmur, click, rub or other extra sounds    Lungs:Chest clear to auscultation; no wheezes, rhonchi,rales ,or rubs present.No increased work of breathing.   Abdomen: bowel sounds normal, soft and non-tender without masses, organomegaly or hernias noted.  No guarding or rebound.   Vascular : all pulses equal ; no bruits present.  Skin:Warm & dry.  Intact without suspicious lesions or rashes ; no tenting or jaundice   Lymphatic: No lymphadenopathy is noted about the head, neck, axilla.   Neuro: Strength, tone & DTRs normal.        Assessment & Plan:  #1 comprehensive physical exam; no acute findings  Plan: see Orders  & Recommendations

## 2015-08-01 ENCOUNTER — Other Ambulatory Visit: Payer: Self-pay | Admitting: Emergency Medicine

## 2015-08-01 ENCOUNTER — Other Ambulatory Visit: Payer: Self-pay | Admitting: Internal Medicine

## 2015-08-01 MED ORDER — TRIAMTERENE-HCTZ 37.5-25 MG PO CAPS: 1.0000 | ORAL_CAPSULE | Freq: Every day | ORAL | Status: DC

## 2015-08-01 MED ORDER — AMLODIPINE BESY-BENAZEPRIL HCL 5-20 MG PO CAPS
1.0000 | ORAL_CAPSULE | Freq: Every day | ORAL | Status: DC
Start: 2015-08-01 — End: 2016-07-30

## 2015-09-08 ENCOUNTER — Ambulatory Visit (INDEPENDENT_AMBULATORY_CARE_PROVIDER_SITE_OTHER): Payer: 59

## 2015-09-08 DIAGNOSIS — Z23 Encounter for immunization: Secondary | ICD-10-CM

## 2015-09-13 ENCOUNTER — Telehealth: Payer: Self-pay | Admitting: Emergency Medicine

## 2015-09-13 NOTE — Telephone Encounter (Signed)
I recommend Dr Ronnald Ramp or Dr Alain Marion for him unless he wants to go out to [facility] to see Dr Larose Kells

## 2015-09-13 NOTE — Telephone Encounter (Signed)
Spoke with pt in regards to a new PCP. She has an appt with Dr Quay Burow on Friday, but her husband does not want to see Dr Quay Burow or Terri Piedra. Pt would like you to give her a call when you get a chance. I informed her that when you were in the office you are with pts.

## 2015-09-15 ENCOUNTER — Encounter: Payer: Self-pay | Admitting: Internal Medicine

## 2015-09-15 ENCOUNTER — Ambulatory Visit (INDEPENDENT_AMBULATORY_CARE_PROVIDER_SITE_OTHER): Payer: 59 | Admitting: Internal Medicine

## 2015-09-15 VITALS — BP 122/86 | HR 85 | Temp 98.3°F | Resp 16 | Wt 201.0 lb

## 2015-09-15 DIAGNOSIS — I10 Essential (primary) hypertension: Secondary | ICD-10-CM | POA: Diagnosis not present

## 2015-09-15 DIAGNOSIS — E785 Hyperlipidemia, unspecified: Secondary | ICD-10-CM | POA: Diagnosis not present

## 2015-09-15 NOTE — Progress Notes (Signed)
Subjective:    Patient ID: Joan [name], female    DOB: [birth year], 59 y.o.   MRN: [id number]  HPI She is here to establish with a new pcp and for routine follow up.  Hyperlipidemia: She is taking her medication sporadically - not daily.  she will not take it for a period of time and then start taking it. She denies any side effects or myalgias with the medication. She is compliant with a low fat/cholesterol diet. She is exercising regularly, but could do more. She currently walks approximately 1 mile 3 days a week.   Hypertension: She is taking her medication daily. She is compliant with a low sodium diet.  She denies chest pain, palpitations, shortness of breath and regular headaches. She experiences mild leg edema. She is exercising minimally and has gained weight.  She does monitor her blood pressure at home and it is well controlled.    Hypothyroidism:  She is taking her medication as prescribed. She follows with endocrine.  She had nodules in the past and had to have radioactive iodine treatment.   She follows with gyn, endo.  Had colonoscopy in 2009-due in 2019.  Does some exercise- walks about one mile three times a week.  She eats healthy.    Medications and allergies reviewed with patient and updated if appropriate.  Patient Active Problem List   Diagnosis Date Noted  . Proteinuria 03/06/2015  . Unspecified vitamin D deficiency 07/14/2013  . LOW BACK PAIN SYNDROME 01/23/2010  . Status post radioactive iodine thyroid ablation 08/17/2009  . Hyperlipidemia 07/26/2008  . ANEMIA-NOS 07/26/2008  . HYPERTENSION 07/26/2008  . Pain in joint, site unspecified 07/26/2008  . GOITER NOS 07/14/2007    Past Medical History  Diagnosis Date  . Hypertension     initially after BCP initiated   . Anemia     due to Dysmenorrhea  . Thyroid disease 2005 & 2009    nodule    Past Surgical History  Procedure Laterality Date  . Flexible sigmoidoscopy  1985     LLQ pain due to ovarian  cyst  . Colonoscopy  2009     Dr Annette [facility]  . Abdominal hysterectomy  1998    for heavy menses and fibroids  . Tonsillectomy    . Biopsy thyroid  2005,2009    both negative  . Exploratory laparotomy  1985    Ovarian cyst    Social History   Social History  . Marital Status: Married    Spouse Name: N/A  . Number of Children: N/A  . Years of Education: N/A   Social History Main Topics  . Smoking status: Never Smoker   . Smokeless tobacco: None  . Alcohol Use: No  . Drug Use: No  . Sexual Activity: Not Currently   Other Topics Concern  . None   Social History Narrative    Review of Systems  Constitutional: Negative for fever, chills and fatigue.  Respiratory: Negative for cough, shortness of breath and wheezing.   Cardiovascular: Positive for leg swelling (mild). Negative for chest pain and palpitations.  Neurological: Negative for dizziness, light-headedness and headaches.       Objective:   Filed Vitals:   09/15/15 1611  BP: 122/86  Pulse: 85  Temp: 98.3 F (36.8 C)  Resp: 16   Filed Weights   09/15/15 1611  Weight: 201 lb (91.173 kg)   Body mass index is 34.48 kg/(m^2).   Physical Exam  Constitutional: She appears  well-developed and well-nourished.  HENT:  Head: Normocephalic and atraumatic.  Cardiovascular: Normal rate, regular rhythm and normal heart sounds.   No murmur heard. Pulmonary/Chest: Effort normal and breath sounds normal. No respiratory distress. She has no wheezes.  Musculoskeletal: She exhibits no edema.          Assessment & Plan:   Health maintenance up-to-date: Colonoscopy done in 2009 Discussed getting shingles vaccine-she will consider Deferred HIV and hepatitis C screening  See problem list  Follow-up next spring

## 2015-09-15 NOTE — Assessment & Plan Note (Signed)
Advised her to try taking the Crestor more regularly-okay to take 3 times a week Increase regular exercise Work on weight loss Will check blood work at her next visit

## 2015-09-15 NOTE — Patient Instructions (Signed)
   All other Health Maintenance issues reviewed.   All recommended immunizations and age-appropriate screenings are up-to-date.  No immunizations administered today.   Medications reviewed and updated. No changes recommended at this time.   Please schedule followup next spring.

## 2015-09-15 NOTE — Telephone Encounter (Signed)
Pt was informed at visit today

## 2015-09-15 NOTE — Progress Notes (Signed)
Pre visit review using our clinic review tool, if applicable. No additional management support is needed unless otherwise documented below in the visit note. 

## 2015-09-15 NOTE — Assessment & Plan Note (Signed)
Well controlled, stable She will continue to monitor at home ideally increase exercise and work on weight loss continue current medication at current dose

## 2015-12-26 ENCOUNTER — Other Ambulatory Visit: Payer: Self-pay

## 2015-12-26 DIAGNOSIS — Z1231 Encounter for screening mammogram for malignant neoplasm of breast: Secondary | ICD-10-CM

## 2016-01-22 ENCOUNTER — Ambulatory Visit
Admission: RE | Admit: 2016-01-22 | Discharge: 2016-01-22 | Disposition: A | Discharge status: Home / Self Care | Payer: 59 | Source: Ambulatory Visit

## 2016-01-22 DIAGNOSIS — Z1231 Encounter for screening mammogram for malignant neoplasm of breast: Secondary | ICD-10-CM

## 2016-02-28 DIAGNOSIS — Z1389 Encounter for screening for other disorder: Secondary | ICD-10-CM | POA: Diagnosis not present

## 2016-02-28 DIAGNOSIS — Z79890 Hormone replacement therapy (postmenopausal): Secondary | ICD-10-CM | POA: Diagnosis not present

## 2016-02-28 DIAGNOSIS — Z780 Asymptomatic menopausal state: Secondary | ICD-10-CM | POA: Diagnosis not present

## 2016-02-28 DIAGNOSIS — Z6834 Body mass index (BMI) 34.0-34.9, adult: Secondary | ICD-10-CM | POA: Diagnosis not present

## 2016-02-28 DIAGNOSIS — N9089 Other specified noninflammatory disorders of vulva and perineum: Secondary | ICD-10-CM | POA: Diagnosis not present

## 2016-02-28 DIAGNOSIS — N898 Other specified noninflammatory disorders of vagina: Secondary | ICD-10-CM | POA: Diagnosis not present

## 2016-02-28 DIAGNOSIS — Z01419 Encounter for gynecological examination (general) (routine) without abnormal findings: Secondary | ICD-10-CM | POA: Diagnosis not present

## 2016-02-28 DIAGNOSIS — Z130 Encounter for screening for diseases of the blood and blood-forming organs and certain disorders involving the immune mechanism: Secondary | ICD-10-CM | POA: Diagnosis not present

## 2016-05-01 ENCOUNTER — Ambulatory Visit (INDEPENDENT_AMBULATORY_CARE_PROVIDER_SITE_OTHER): Payer: 59 | Admitting: Internal Medicine

## 2016-05-01 ENCOUNTER — Encounter: Payer: Self-pay | Admitting: Internal Medicine

## 2016-05-01 VITALS — BP 124/86 | HR 97 | Temp 98.0°F | Resp 16 | Wt 203.0 lb

## 2016-05-01 DIAGNOSIS — M545 Low back pain: Secondary | ICD-10-CM | POA: Diagnosis not present

## 2016-05-01 DIAGNOSIS — R5381 Other malaise: Secondary | ICD-10-CM | POA: Diagnosis not present

## 2016-05-01 DIAGNOSIS — D649 Anemia, unspecified: Secondary | ICD-10-CM

## 2016-05-01 DIAGNOSIS — G8929 Other chronic pain: Secondary | ICD-10-CM

## 2016-05-01 DIAGNOSIS — E785 Hyperlipidemia, unspecified: Secondary | ICD-10-CM

## 2016-05-01 DIAGNOSIS — I10 Essential (primary) hypertension: Secondary | ICD-10-CM

## 2016-05-01 DIAGNOSIS — E669 Obesity, unspecified: Secondary | ICD-10-CM | POA: Insufficient documentation

## 2016-05-01 DIAGNOSIS — R109 Unspecified abdominal pain: Secondary | ICD-10-CM | POA: Insufficient documentation

## 2016-05-01 NOTE — Assessment & Plan Note (Signed)
BP well controlled Current regimen effective and well tolerated Continue current medications at current doses  

## 2016-05-01 NOTE — Assessment & Plan Note (Signed)
History of anemia Check CBC 

## 2016-05-01 NOTE — Assessment & Plan Note (Signed)
Check lipid panel Continue Crestor at current dose

## 2016-05-01 NOTE — Assessment & Plan Note (Signed)
She has chronic back pain, lower back and midback Currently following with a chiropractor We'll refer to physical therapy, which she has found helpful in the past-they may also be able to help her with physical conditioning

## 2016-05-01 NOTE — Assessment & Plan Note (Signed)
Started with increased weight gain Exam is normal-abdomen nontender, no rib tenderness Discomfort likely related to weight gain-stretching the muscles and possibly a hiatal hernia Stressed weight loss, which we discussed at length Check CMP, CBC If symptoms worsen or change she will let me know

## 2016-05-01 NOTE — Progress Notes (Signed)
Pre visit review using our clinic review tool, if applicable. No additional management support is needed unless otherwise documented below in the visit note. 

## 2016-05-01 NOTE — Assessment & Plan Note (Signed)
Obesity with increased weight gain recently Like to really related to increased caloric intake and lack of exercise We'll have thyroid checked by endocrine Stressed regular exercise and decreasing calories

## 2016-05-01 NOTE — Patient Instructions (Addendum)
  Test(s) ordered today. Your results will be released to MyChart (or called to you) after review, usually within 72hours after test completion. If any changes need to be made, you will be notified at that same time.    Medications reviewed and updated.  No changes recommended at this time.     

## 2016-05-01 NOTE — Progress Notes (Signed)
Subjective:    Patient ID: Joan [name], female    DOB: 09-07-1956, 60 y.o.   MRN: [id number]  HPI She is here for an acute visit.   She has not felt well recently.  She has gained weight.  The weight is in the mid drift and that concerns her.  It feels tighter than it used to and affects her back.  She is snoring and not sleeping as well.  She is concerned about diabetes, metabolic syndrome.  She was concerned something else was going on with the discomfort she was experiencing. She denies any discomfort at this time.  She is not currently exercising.  She used to walk and has not felt like it in the past few years.  She is eats a lot fruit and tries to eat healthy.    Gerd: she has had increased gerd. She has been taking omeprazole more often but takes it for brief periods of time and then stops it.     Hypothyroidism: Her thyroid is managed by endocrine. She does have an upcoming appointment. She is taking her medication daily.  Hypertension: She is taking her medication daily. She is compliant with a low sodium diet.  She denies chest pain, palpitations, edema, shortness of breath and regular headaches. She is not exercising regularly.     Hyperlipidemia: She is taking her medication daily. She is compliant with a low fat/cholesterol diet. She is not exercising regularly. She denies myalgias.   She is trying to decrease her estrogen slowly. She does follow with a gynecologist.  Medications and allergies reviewed with patient and updated if appropriate.  Patient Active Problem List   Diagnosis Date Noted  . Proteinuria 03/06/2015  . Unspecified vitamin D deficiency 07/14/2013  . LOW BACK PAIN SYNDROME 01/23/2010  . Status post radioactive iodine thyroid ablation 08/17/2009  . Hyperlipidemia 07/26/2008  . ANEMIA-NOS 07/26/2008  . Essential hypertension 07/26/2008  . Pain in joint, site unspecified 07/26/2008  . GOITER NOS 07/14/2007    Current Outpatient Prescriptions on  File Prior to Visit  Medication Sig Dispense Refill  . amLODipine-benazepril (LOTREL) 5-20 MG per capsule Take 1 capsule by mouth daily. 90 capsule 3  . Cholecalciferol (VITAMIN D3) 2000 UNITS TABS Take by mouth daily.    Marland Kitchen estradiol (VIVELLE-DOT) 0.025 MG/24HR Place 1 patch onto the skin 2 (two) times a week.      . rosuvastatin (CRESTOR) 5 MG tablet Take 5 mg by mouth. 2-3 times weekly    . triamterene-hydrochlorothiazide (DYAZIDE) 37.5-25 MG per capsule Take 1 each (1 capsule total) by mouth daily. 90 capsule 3  . Vitamin D, Ergocalciferol, (DRISDOL) 50000 UNITS CAPS capsule Take 50,000 Units by mouth every 7 (seven) days.     No current facility-administered medications on file prior to visit.    Past Medical History  Diagnosis Date  . Hypertension     initially after BCP initiated   . Anemia     due to Dysmenorrhea  . Thyroid disease 2005 & 2009    nodule    Past Surgical History  Procedure Laterality Date  . Flexible sigmoidoscopy  1985     LLQ pain due to ovarian cyst  . Colonoscopy  2009     Dr Annette [facility]  . Abdominal hysterectomy  1998    for heavy menses and fibroids  . Tonsillectomy    . Biopsy thyroid  2005,2009    both negative  . Exploratory laparotomy  1985  Ovarian cyst    Social History   Social History  . Marital Status: Married    Spouse Name: N/A  . Number of Children: N/A  . Years of Education: N/A   Social History Main Topics  . Smoking status: Never Smoker   . Smokeless tobacco: Not on file  . Alcohol Use: No  . Drug Use: No  . Sexual Activity: Not Currently   Other Topics Concern  . Not on file   Social History Narrative    Family History  Problem Relation Age of Onset  . Hypertension Father   . Kidney failure Father     Bright's Disease; died @ 7  . Hypertension Mother   . Hyperlipidemia Mother   . Osteoarthritis Mother   . Hypertension Brother   . Diabetes Brother   . Stroke Maternal Grandmother     in 83s  .  Hypertension Maternal Aunt     also DJD/ OA  . Breast cancer Maternal Aunt   . Heart attack Maternal Grandfather 69    also  CVA  . Heart block Paternal Grandmother     pacemaker    Review of Systems  Constitutional: Negative for fever.  Respiratory: Negative for cough, shortness of breath and wheezing.        Snoring  Cardiovascular: Positive for leg swelling. Negative for chest pain and palpitations.  Gastrointestinal: Positive for abdominal pain.       Gerd   Musculoskeletal: Positive for back pain (Chronic mid back and lower back).  Neurological: Negative for dizziness, light-headedness and headaches.       Objective:   Filed Vitals:   05/01/16 1646  BP: 124/86  Pulse: 97  Temp: 98 F (36.7 C)  Resp: 16   Filed Weights   05/01/16 1646  Weight: 203 lb (92.08 kg)   Body mass index is 34.83 kg/(m^2).   Physical Exam Constitutional: Appears well-developed and well-nourished. No distress.  Neck: Neck supple. No tracheal deviation present. No thyromegaly present.  No carotid bruit. No cervical adenopathy.   Cardiovascular: Normal rate, regular rhythm and normal heart sounds.   No murmur heard.  trace edema Pulmonary/Chest: Effort normal and breath sounds normal. No respiratory distress. No wheezes. no rib tenderness with palpation Abdomen: soft, nontender, nondistended, no HSM, no masses       Assessment & Plan:   See Problem List for Assessment and Plan of chronic medical problems.

## 2016-05-02 ENCOUNTER — Other Ambulatory Visit (INDEPENDENT_AMBULATORY_CARE_PROVIDER_SITE_OTHER): Payer: 59

## 2016-05-02 ENCOUNTER — Encounter: Payer: Self-pay | Admitting: Internal Medicine

## 2016-05-02 DIAGNOSIS — R945 Abnormal results of liver function studies: Secondary | ICD-10-CM

## 2016-05-02 DIAGNOSIS — I10 Essential (primary) hypertension: Secondary | ICD-10-CM

## 2016-05-02 DIAGNOSIS — R7989 Other specified abnormal findings of blood chemistry: Secondary | ICD-10-CM | POA: Insufficient documentation

## 2016-05-02 DIAGNOSIS — E785 Hyperlipidemia, unspecified: Secondary | ICD-10-CM | POA: Diagnosis not present

## 2016-05-02 LAB — LIPID PANEL
CHOLESTEROL: 169 mg/dL (ref 0–200)
HDL: 51.5 mg/dL (ref >39.00)
LDL Cholesterol: 96 mg/dL (ref 0–99)
NonHDL: 117.75
TRIGLYCERIDES: 107 mg/dL (ref 0.0–149.0)
Total CHOL/HDL Ratio: 3
VLDL: 21.4 mg/dL (ref 0.0–40.0)

## 2016-05-02 LAB — CBC WITH DIFFERENTIAL/PLATELET
BASOS ABS: 0.1 10*3/uL (ref 0.0–0.1)
BASOS PCT: 0.7 % (ref 0.0–3.0)
Eosinophils Absolute: 0.1 10*3/uL (ref 0.0–0.7)
Eosinophils Relative: 1.1 % (ref 0.0–5.0)
HEMATOCRIT: 42.6 % (ref 36.0–46.0)
Hemoglobin: 14.6 g/dL (ref 12.0–15.0)
LYMPHS ABS: 2.3 10*3/uL (ref 0.7–4.0)
Lymphocytes Relative: 25.1 % (ref 12.0–46.0)
MCHC: 34.2 g/dL (ref 30.0–36.0)
MCV: 91.9 fl (ref 78.0–100.0)
MONOS PCT: 7.5 % (ref 3.0–12.0)
Monocytes Absolute: 0.7 10*3/uL (ref 0.1–1.0)
NEUTROS ABS: 6 10*3/uL (ref 1.4–7.7)
Neutrophils Relative %: 65.6 % (ref 43.0–77.0)
PLATELETS: 258 10*3/uL (ref 150.0–400.0)
RBC: 4.64 Mil/uL (ref 3.87–5.11)
RDW: 13.9 % (ref 11.5–15.5)
WBC: 9.1 10*3/uL (ref 4.0–10.5)

## 2016-05-02 LAB — HEMOGLOBIN A1C: Hgb A1c MFr Bld: 5.6 % (ref 4.6–6.5)

## 2016-05-02 LAB — COMPREHENSIVE METABOLIC PANEL
ALT: 60 U/L — AB (ref 0–35)
AST: 39 U/L — ABNORMAL HIGH (ref 0–37)
Albumin: 4.6 g/dL (ref 3.5–5.2)
Alkaline Phosphatase: 77 U/L (ref 39–117)
BUN: 13 mg/dL (ref 6–23)
CALCIUM: 9.4 mg/dL (ref 8.4–10.5)
CHLORIDE: 103 meq/L (ref 96–112)
CO2: 30 mEq/L (ref 19–32)
Creatinine, Ser: 0.75 mg/dL (ref 0.40–1.20)
GFR: 83.14 mL/min (ref >60.00)
GLUCOSE: 116 mg/dL — AB (ref 70–99)
POTASSIUM: 4.1 meq/L (ref 3.5–5.1)
Sodium: 141 mEq/L (ref 135–145)
Total Bilirubin: 0.5 mg/dL (ref 0.2–1.2)
Total Protein: 7.3 g/dL (ref 6.0–8.3)

## 2016-05-15 ENCOUNTER — Ambulatory Visit (INDEPENDENT_AMBULATORY_CARE_PROVIDER_SITE_OTHER): Payer: 59 | Admitting: Internal Medicine

## 2016-05-15 ENCOUNTER — Encounter: Payer: Self-pay | Admitting: Internal Medicine

## 2016-05-15 VITALS — BP 114/72 | HR 86 | Temp 97.1°F | Resp 16 | Wt 199.0 lb

## 2016-05-15 DIAGNOSIS — R109 Unspecified abdominal pain: Secondary | ICD-10-CM | POA: Diagnosis not present

## 2016-05-15 DIAGNOSIS — R7989 Other specified abnormal findings of blood chemistry: Secondary | ICD-10-CM | POA: Diagnosis not present

## 2016-05-15 DIAGNOSIS — R945 Abnormal results of liver function studies: Secondary | ICD-10-CM

## 2016-05-15 DIAGNOSIS — R10A1 Flank pain, right side: Secondary | ICD-10-CM | POA: Insufficient documentation

## 2016-05-15 NOTE — Assessment & Plan Note (Signed)
We'll check abdominal ultrasound-likely fatty liver Stressed weight loss Discussed increased risk of cirrhosis

## 2016-05-15 NOTE — Assessment & Plan Note (Signed)
Her pain sounds musculoskeletal in nature-increased pain after standing and cooking, worse with movement and better with Aleve Less likely gallbladder or liver in nature Will check abdominal ultrasound Take Aleve as needed Sees a chiropractor monthly

## 2016-05-15 NOTE — Progress Notes (Signed)
Subjective:    Patient ID: Joan [name], female    DOB: 1956-10-01, 61 y.o.   MRN: [id number]  HPI She is here for an acute visit.   Abdomen band like feeling across her upper abdomen for over one year.  This is not new and she still has it.   Right side pain:  Three days ago she had some increased back pain related to standing and cooking a lot.  This is not uncommon, but the pain has been a little different.  She has pain on the right side and it radiates to the right upper abdomen.  It hurts to move.  The pain is constant and increases with movement.  It is not worse with eating.    The pain has gotten a little better with aleve.  The pain has improved over the past couple of days.    She was concerned about her liver and gallbladder. Her last liver tests were slightly elevated.She has been trying to stay away from fatty and fried foods.    Medications and allergies reviewed with patient and updated if appropriate.  Patient Active Problem List   Diagnosis Date Noted  . Elevated LFTs 05/02/2016  . Obesity 05/01/2016  . Abdominal discomfort 05/01/2016  . Proteinuria 03/06/2015  . Unspecified vitamin D deficiency 07/14/2013  . LOW BACK PAIN SYNDROME 01/23/2010  . Status post radioactive iodine thyroid ablation 08/17/2009  . Hyperlipidemia 07/26/2008  . ANEMIA-NOS 07/26/2008  . Essential hypertension 07/26/2008  . Pain in joint, site unspecified 07/26/2008  . GOITER NOS 07/14/2007    Current Outpatient Prescriptions on File Prior to Visit  Medication Sig Dispense Refill  . amLODipine-benazepril (LOTREL) 5-20 MG per capsule Take 1 capsule by mouth daily. 90 capsule 3  . Cholecalciferol (VITAMIN D3) 2000 UNITS TABS Take by mouth daily.    Marland Kitchen estradiol (VIVELLE-DOT) 0.025 MG/24HR Place 1 patch onto the skin 2 (two) times a week.      . levothyroxine (SYNTHROID, LEVOTHROID) 100 MCG tablet Take 100 mcg by mouth daily before breakfast.    . omeprazole (PRILOSEC) 20 MG capsule  Take 20 mg by mouth daily.    . rosuvastatin (CRESTOR) 5 MG tablet Take 5 mg by mouth. 2-3 times weekly    . triamterene-hydrochlorothiazide (DYAZIDE) 37.5-25 MG per capsule Take 1 each (1 capsule total) by mouth daily. 90 capsule 3  . Vitamin D, Ergocalciferol, (DRISDOL) 50000 UNITS CAPS capsule Take 50,000 Units by mouth every 7 (seven) days.     No current facility-administered medications on file prior to visit.    Past Medical History  Diagnosis Date  . Hypertension     initially after BCP initiated   . Anemia     due to Dysmenorrhea  . Thyroid disease 2005 & 2009    nodule    Past Surgical History  Procedure Laterality Date  . Flexible sigmoidoscopy  1985     LLQ pain due to ovarian cyst  . Colonoscopy  2009     Dr Annette [facility]  . Abdominal hysterectomy  1998    for heavy menses and fibroids  . Tonsillectomy    . Biopsy thyroid  2005,2009    both negative  . Exploratory laparotomy  1985    Ovarian cyst    Social History   Social History  . Marital Status: Married    Spouse Name: N/A  . Number of Children: N/A  . Years of Education: N/A   Social History Main Topics  .  Smoking status: Never Smoker   . Smokeless tobacco: Not on file  . Alcohol Use: No  . Drug Use: No  . Sexual Activity: Not Currently   Other Topics Concern  . Not on file   Social History Narrative    Family History  Problem Relation Age of Onset  . Hypertension Father   . Kidney failure Father     Bright's Disease; died @ 9  . Hypertension Mother   . Hyperlipidemia Mother   . Osteoarthritis Mother   . Hypertension Brother   . Diabetes Brother   . Stroke Maternal Grandmother     in 29s  . Hypertension Maternal Aunt     also DJD/ OA  . Breast cancer Maternal Aunt   . Heart attack Maternal Grandfather 69    also  CVA  . Heart block Paternal Grandmother     pacemaker    Review of Systems  Constitutional: Negative for fever and chills.  Gastrointestinal: Positive for  abdominal pain and anal bleeding (hemorrhoidal). Negative for nausea, diarrhea, constipation and blood in stool.       Occ - takes zantac  Genitourinary: Negative for dysuria, urgency, frequency and hematuria.       Objective:   Filed Vitals:   05/15/16 1119  BP: 114/72  Pulse: 86  Temp: 97.1 F (36.2 C)  Resp: 16   Filed Weights   05/15/16 1119  Weight: 199 lb (90.266 kg)   Body mass index is 34.14 kg/(m^2).   Physical Exam  Constitutional: She appears well-developed and well-nourished. No distress.  Abdominal: Soft. She exhibits no distension. There is no tenderness. There is no rebound and no guarding.  obese  Musculoskeletal: She exhibits no edema.  No tenderness along ribs or in mid back  Skin: She is not diaphoretic.          Assessment & Plan:    See Problem List for Assessment and Plan of chronic medical problems.

## 2016-05-15 NOTE — Progress Notes (Signed)
Pre visit review using our clinic review tool, if applicable. No additional management support is needed unless otherwise documented below in the visit note. 

## 2016-05-15 NOTE — Patient Instructions (Signed)
Take aleve or advil as needed for your pain.  An ultrasound was ordered and we will call you to schedule this.

## 2016-05-20 ENCOUNTER — Other Ambulatory Visit: Payer: Self-pay | Admitting: Internal Medicine

## 2016-05-20 ENCOUNTER — Ambulatory Visit (HOSPITAL_COMMUNITY)
Admission: RE | Admit: 2016-05-20 | Discharge: 2016-05-20 | Disposition: A | Discharge status: Home / Self Care | Payer: 59 | Source: Ambulatory Visit | Attending: Internal Medicine | Admitting: Internal Medicine

## 2016-05-20 ENCOUNTER — Encounter: Payer: Self-pay | Admitting: Internal Medicine

## 2016-05-20 DIAGNOSIS — R109 Unspecified abdominal pain: Secondary | ICD-10-CM | POA: Diagnosis not present

## 2016-05-20 DIAGNOSIS — K760 Fatty (change of) liver, not elsewhere classified: Secondary | ICD-10-CM | POA: Diagnosis not present

## 2016-05-20 DIAGNOSIS — K824 Cholesterolosis of gallbladder: Secondary | ICD-10-CM

## 2016-05-20 DIAGNOSIS — R7989 Other specified abnormal findings of blood chemistry: Secondary | ICD-10-CM

## 2016-05-20 DIAGNOSIS — R945 Abnormal results of liver function studies: Secondary | ICD-10-CM

## 2016-05-24 ENCOUNTER — Ambulatory Visit: Payer: 59

## 2016-05-30 DIAGNOSIS — R7301 Impaired fasting glucose: Secondary | ICD-10-CM | POA: Diagnosis not present

## 2016-05-30 DIAGNOSIS — I10 Essential (primary) hypertension: Secondary | ICD-10-CM | POA: Diagnosis not present

## 2016-05-30 DIAGNOSIS — E559 Vitamin D deficiency, unspecified: Secondary | ICD-10-CM | POA: Diagnosis not present

## 2016-05-30 DIAGNOSIS — E890 Postprocedural hypothyroidism: Secondary | ICD-10-CM | POA: Diagnosis not present

## 2016-05-30 DIAGNOSIS — E7800 Pure hypercholesterolemia, unspecified: Secondary | ICD-10-CM | POA: Diagnosis not present

## 2016-05-31 ENCOUNTER — Ambulatory Visit: Payer: 59 | Attending: Internal Medicine | Admitting: Physical Therapy

## 2016-05-31 DIAGNOSIS — M546 Pain in thoracic spine: Secondary | ICD-10-CM | POA: Insufficient documentation

## 2016-05-31 DIAGNOSIS — M6281 Muscle weakness (generalized): Secondary | ICD-10-CM | POA: Insufficient documentation

## 2016-05-31 DIAGNOSIS — R293 Abnormal posture: Secondary | ICD-10-CM | POA: Diagnosis not present

## 2016-05-31 NOTE — Patient Instructions (Signed)
   [name] PT [facility] [address], [place], [place] [address] Phone # [phone] Fax [phone]    Posture Tips DO: - stand tall and erect - keep chin tucked in - keep head and shoulders in alignment - check posture regularly in mirror or large window - pull head back against headrest in car seat;  Change your position often.  Sit with lumbar support. DON'T: - slouch or slump while watching TV or reading - sit, stand or lie in one position  for too long;  Sitting is especially hard on the spine so if you sit at a desk/use the computer, then stand up often!   Copyright  VHI. All rights reserved.  Posture - Standing   Good posture is important. Avoid slouching and forward head thrust. Maintain curve in low back and align ears over shoul- ders, hips over ankles.  Pull your belly button in toward your back bone.   Copyright  VHI. All rights reserved.  Posture - Sitting   Sit upright, head facing forward. Try using a roll to support lower back. Keep shoulders relaxed, and avoid rounded back. Keep hips level with knees. Avoid crossing legs for long periods.   Copyright  VHI. All rights reserved.    Extensors, Supine   Lie supine, head on small, rolled towel. Gently tuck chin and bring toward chest. Hold __3_ seconds. Repeat 8times per session. Do __2_ sessions per day.  Copyright  VHI. All rights reserved.  Flexibility: Neck Retraction   Pull head straight back, keeping eyes and jaw level. Repeat __8__ times per set. Do __1__ sets per session. Do ___3_ sessions per day.  http://orth.exer.us/344   Copyright  VHI. All rights reserved.     Shoulder blade squeezes 10x 3x/day

## 2016-05-31 NOTE — Therapy (Signed)
[facility] [address] [place], [name], [address] Phone: [phone]   Fax:  [phone]  Physical Therapy Evaluation  Patient Details  Name: Joan [name] MRN: [id number] Date of Birth: [birth year] Referring Provider: Dr. Quay Burow  Encounter Date: 05/31/2016      PT End of Session - 05/31/16 0841    Visit Number 1   Date for PT Re-Evaluation 07/26/16   PT Start Time 0745   PT Stop Time 0838   PT Time Calculation (min) 53 min   Activity Tolerance Patient tolerated treatment well      Past Medical History  Diagnosis Date  . Hypertension     initially after BCP initiated   . Anemia     due to Dysmenorrhea  . Thyroid disease 2005 & 2009    nodule    Past Surgical History  Procedure Laterality Date  . Flexible sigmoidoscopy  1985     LLQ pain due to ovarian cyst  . Colonoscopy  2009     Dr Annette [facility]  . Abdominal hysterectomy  1998    for heavy menses and fibroids  . Tonsillectomy    . Biopsy thyroid  2005,2009    both negative  . Exploratory laparotomy  1985    Ovarian cyst    There were no vitals filed for this visit.       Subjective Assessment - 05/31/16 0749    Subjective Previous history of mid back pain, between shoulder blades,  right lower lateral rib pain.   Hurts more with standing for long periods of time.  Prolonged sitting aggravates too.  Some neck pain this morning.  Sees chiro for this problem every month but agrees to hold off while doing PT.     Pertinent History hx of hip and shoulder bursitis;  osteopenia   Limitations Standing;Sitting   How long can you sit comfortably? 1 hour   How long can you stand comfortably? 30 min   Diagnostic tests U/S right side showed fatty liver;  bone density shows osteopenia   Patient Stated Goals I want to lose 40#   Currently in Pain? Yes   Pain Score 4    Pain Location Back   Pain Orientation Upper;Mid   Pain Type Chronic pain   Pain Onset More than a month ago   Pain Frequency Constant   Aggravating Factors  standing, rotating while cooking; sitting   Pain Relieving Factors   lying down;  pain medication            [facility] PT Assessment - 05/31/16 0001    Assessment   Medical Diagnosis back pain (mid)   Referring Provider Dr. Quay Burow   Onset Date/Surgical Date --  6 months   Hand Dominance Right   Next MD Visit August   Prior Therapy 2 years ago here at [facility]   Precautions   Precautions None  osteopenia   Restrictions   Weight Bearing Restrictions No   Balance Screen   Has the patient fallen in the past 6 months No   Has the patient had a decrease in activity level because of a fear of falling?  No   Is the patient reluctant to leave their home because of a fear of falling?  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   Leisure 2 year old grandchild (lift); reading;  3 dogs   Observation/Other Assessments   Focus on Therapeutic Outcomes (FOTO)  50% limitation   ROM / Strength   AROM / PROM / Strength AROM;Strength   AROM   AROM Assessment Site Cervical;Lumbar;Thoracic;Shoulder   Right/Left Shoulder Right;Left   Right Shoulder Flexion 145 Degrees   Right Shoulder ABduction 105 Degrees   Right Shoulder Internal Rotation --  L4   Right Shoulder External Rotation 60 Degrees   Left Shoulder Flexion 127 Degrees   Left Shoulder ABduction 65 Degrees   Left Shoulder Internal Rotation --  L5   Left Shoulder External Rotation 50 Degrees   Cervical Flexion 60   Cervical Extension 45   Cervical - Right Side Bend 22   Cervical - Left Side Bend 18   Cervical - Right Rotation 30   Cervical -  Left Rotation 35   Lumbar Flexion 50   Lumbar Extension 15   Thoracic Flexion WFLs   Thoracic Extension 0   Thoracic - Right Rotation 30  painful   Thoracic - Left Rotation 30  painful   Strength   Overall Strength Comments Middle and lower traps 3+/5   Strength Assessment Site Shoulder;Cervical;Thoracic;Lumbar   Cervical Flexion 3+/5   Cervical Extension 3+/5   Lumbar Flexion 3+/5   Lumbar Extension 3+/5   Thoracic Flexion 3+/5   Thoracic Extension 3+/5   Palpation   Palpation comment No tenderness in thoracic spine or musculature today                           PT Education - 05/31/16 0840    Education provided Yes   Education Details cervical retractions, scapular squeezes; postural correction with lumbar roll   Person(s) Educated Patient   Methods Explanation;Demonstration;Handout   Comprehension Verbalized understanding;Returned demonstration          PT Short Term Goals - 05/31/16 1207    PT SHORT TERM GOAL #1   Title The patient will demonstrate improved postural correction and safe body mechanics related to osteopenia   06/28/16   Time 4   Period Weeks   Status New   PT SHORT TERM GOAL #2   Title The patient will have improved cervical rotation and thoracic rotation to 35 degrees needed for driving     Time 4   Period Weeks   Status New   PT SHORT TERM GOAL #3   Title The patient will report a 25% improvement in pain with standing to cook   Time 4   Period Weeks   Status New   PT SHORT TERM GOAL #4   Title Patient will have improved thoracic extension to 5 degrees needed to decrease vertebral body compression and improve UE ovehead use   Time 4   Period Weeks   Status New           PT Long Term Goals - 05/31/16 1210    PT LONG TERM GOAL #1   Title The patient will be independent in safe self progression of HEP needed for further improvements in pain, strength, ROM and posture   07/26/16   Time 8   Period Weeks   Status New   PT  LONG TERM GOAL #2  Title The patient will report a 60% improvement in pain with standing to cook > 30 min   Time 8   Period Weeks   Status New   PT LONG TERM GOAL #3   Title The patient will have improved core and scapular muscle strength to grossly 4/5 needed to stand for > 40 min   Time 8   Period Weeks   Status New   PT LONG TERM GOAL #4   Title Cervical  rotation improved to 40 degrees needed for driving   Time 8   Period Weeks   Status New   PT LONG TERM GOAL #5   Title FOTO functional outcome score improved from 50% limitation to 40% indicating improved function with less pain   Time 8   Period Weeks   Status New               Plan - 05/31/16 [id number]    Clinical Impression Statement The patient presents with a primary complaint of midback pain especially with prolonged standing with cooking as well as with prolonged sitting.  She also reports some neck stiffness with turning her head and a chronic history of some low back pain.  Increased thoracic kyphosis with forward head.  Decreased lumbar lordosis.  Decreased cervical AROM in all planes.  Decreased ROM and painful left shoulder which she attributes to a history of bursitis.  Decreased thoracic extension.  Decreased lumbar extension.  Decreased core activation and strength 3+/5 and decreased periscapular muscle strength 3+/5.  No tender points today but patient indicates times of tenderness in rhomboid muscles.  She lacks knowledge of proper posture and considerations in body mechanics with osteopenia.   The patient has had long term chiropractic care for her back but agrees to hold while undergoing PT.  The patient is of moderate complexity evaluation.     Rehab Potential Good   Clinical Impairments Affecting Rehab Potential osteopenia    PT Frequency 2x / week   PT Duration 8 weeks   PT Treatment/Interventions ADLs/Self Care Home Management;Iontophoresis 4mg /ml Dexamethasone;Cryotherapy;Electrical Stimulation;Moist  Heat;Ultrasound;Therapeutic exercise;Neuromuscular re-education;Patient/family education;Manual techniques;Dry needling;Taping   PT Next Visit Plan review cervical and scapular retractions; initiate ab bracing; postural strengthening;  modalities as needed for pain control      Patient will benefit from skilled therapeutic intervention in order to improve the following deficits and impairments:  Decreased range of motion, Decreased strength, Pain, Postural dysfunction, Impaired UE functional use  Visit Diagnosis: Pain in thoracic spine - Plan: PT plan of care cert/re-cert  Muscle weakness (generalized) - Plan: PT plan of care cert/re-cert  Abnormal posture - Plan: PT plan of care cert/re-cert     Problem List Patient Active Problem List   Diagnosis Date Noted  . Gallbladder polyp 05/20/2016  . Fatty liver 05/20/2016  . Right flank pain 05/15/2016  . Elevated LFTs 05/02/2016  . Obesity 05/01/2016  . Abdominal discomfort 05/01/2016  . Proteinuria 03/06/2015  . Unspecified vitamin D deficiency 07/14/2013  . LOW BACK PAIN SYNDROME 01/23/2010  . Status post radioactive iodine thyroid ablation 08/17/2009  . Hyperlipidemia 07/26/2008  . ANEMIA-NOS 07/26/2008  . Essential hypertension 07/26/2008  . Pain in joint, site unspecified 07/26/2008  . Goiter 07/14/2007    [name], PT 05/31/2016 12:18 PM Phone: [phone] Fax: [phone]  [name] 05/31/2016, 12:18 PM  [facility] [address] [place], [name], [address] Phone: [phone]   Fax:  [phone]  Name: Stanton Kidney  [place] MRN: [id number] Date of Birth: March 02, 1956

## 2016-06-03 ENCOUNTER — Encounter: Payer: Self-pay | Admitting: Physical Therapy

## 2016-06-03 ENCOUNTER — Ambulatory Visit: Payer: 59 | Admitting: Physical Therapy

## 2016-06-03 DIAGNOSIS — M546 Pain in thoracic spine: Secondary | ICD-10-CM | POA: Diagnosis not present

## 2016-06-03 DIAGNOSIS — M6281 Muscle weakness (generalized): Secondary | ICD-10-CM | POA: Diagnosis not present

## 2016-06-03 DIAGNOSIS — R293 Abnormal posture: Secondary | ICD-10-CM

## 2016-06-03 NOTE — Therapy (Signed)
[facility] [address], [place], [name], [address] Phone: [phone]   Fax:  [phone]  Physical Therapy Treatment  Patient Details  Name: Joan [name] MRN: [id number] Date of Birth: 1956/07/30 Referring Provider: Dr. Quay Burow  Encounter Date: 06/03/2016      PT End of Session - 06/03/16 0839    Visit Number 2   Date for PT Re-Evaluation 07/26/16   PT Start Time 0802   PT Stop Time 0840   PT Time Calculation (min) 38 min   Activity Tolerance Patient tolerated treatment well   Behavior During Therapy [facility] for tasks assessed/performed      Past Medical History  Diagnosis Date  . Hypertension     initially after BCP initiated   . Anemia     due to Dysmenorrhea  . Thyroid disease 2005 & 2009    nodule    Past Surgical History  Procedure Laterality Date  . Flexible sigmoidoscopy  1985     LLQ pain due to ovarian cyst  . Colonoscopy  2009     Dr Annette Stable [facility]  . Abdominal hysterectomy  1998    for heavy menses and fibroids  . Tonsillectomy    . Biopsy thyroid  2005,2009    both negative  . Exploratory laparotomy  1985    Ovarian cyst    There were no vitals filed for this visit.      Subjective Assessment - 06/03/16 0806    Subjective Pt states she performed HEP with no increase in pain. Has been waking up during sleep and having to shift positions due to pain.   Currently in Pain? Yes   Pain Score 3    Pain Location Back   Pain Orientation Mid   Pain Descriptors / Indicators Aching   Pain Relieving Factors aspirin, rest                         [facility] Adult PT Treatment/Exercise - 06/03/16 0001    Neck Exercises: Supine   Neck Retraction 10 reps   Shoulder Exercises: Supine   Horizontal ABduction Strengthening;20 reps;Theraband   Theraband Level (Shoulder Horizontal ABduction) Level 2 (Red)   Other Supine Exercises diagonal with red t band R shoulder only (painful Lt  shoulder)   Shoulder Exercises: Standing   Extension Strengthening;20 reps   Theraband Level (Shoulder Extension) Level 2 (Red)   Retraction Strengthening;Both;20 reps;Theraband   Theraband Level (Shoulder Retraction) Level 2 (Red)   Shoulder Exercises: ROM/Strengthening   UBE (Upper Arm Bike) L1 x 4 mins (2/2)_   Modalities   Modalities Iontophoresis   Iontophoresis   Type of Iontophoresis Dexamethasone   Location Lt pec   Dose 20ml   Manual Therapy   Manual Therapy Soft tissue mobilization   Soft tissue mobilization trigger point release Lt pec to increase ROM and decrease Lt shoulder pain                PT Education - 06/03/16 0838    Education provided Yes   Education Details iontophoresis, HEP for theraband scapula therex   Person(s) Educated Patient   Methods Explanation;Demonstration   Comprehension Verbalized understanding;Returned demonstration          PT Short Term Goals - 06/03/16 0840    PT SHORT TERM GOAL #1   Title The patient will demonstrate improved postural correction and safe body mechanics related to osteopenia   06/28/16   Status  On-going   PT SHORT TERM GOAL #2   Title The patient will have improved cervical rotation and thoracic rotation to 35 degrees needed for driving     Status On-going   PT SHORT TERM GOAL #3   Title The patient will report a 25% improvement in pain with standing to cook   Status On-going   PT SHORT TERM GOAL #4   Title Patient will have improved thoracic extension to 5 degrees needed to decrease vertebral body compression and improve UE ovehead use   Status On-going           PT Long Term Goals - 06/03/16 0841    PT LONG TERM GOAL #1   Title The patient will be independent in safe self progression of HEP needed for further improvements in pain, strength, ROM and posture   07/26/16   Status On-going   PT LONG TERM GOAL #2   Title The patient will report a 60% improvement in pain with standing to cook > 30 min    Status On-going   PT LONG TERM GOAL #3   Title The patient will have improved core and scapular muscle strength to grossly 4/5 needed to stand for > 40 min   Status On-going   PT LONG TERM GOAL #4   Title Cervical  rotation improved to 40 degrees needed for driving   Status On-going   PT LONG TERM GOAL #5   Title FOTO functional outcome score improved from 50% limitation to 40% indicating improved function with less pain   Status On-going               Plan - 06/03/16 0839    Clinical Impression Statement Pt with pain with Lt shoulder exercises but able to complete postural strengthening iwth no increase in neck or back pain. Initiated ionto today to decrease Lt pec pain to improve shoulder ROM    Clinical Impairments Affecting Rehab Potential osteopenia    PT Frequency 2x / week   PT Duration 8 weeks   PT Treatment/Interventions ADLs/Self Care Home Management;Iontophoresis 4mg /ml Dexamethasone;Cryotherapy;Electrical Stimulation;Moist Heat;Ultrasound;Therapeutic exercise;Neuromuscular re-education;Patient/family education;Manual techniques;Dry needling;Taping   PT Next Visit Plan assess ionto, continue postural strengthening, initiate ab bracing      Patient will benefit from skilled therapeutic intervention in order to improve the following deficits and impairments:  Decreased range of motion, Decreased strength, Pain, Postural dysfunction, Impaired UE functional use  Visit Diagnosis: Pain in thoracic spine  Muscle weakness (generalized)  Abnormal posture     Problem List Patient Active Problem List   Diagnosis Date Noted  . Gallbladder polyp 05/20/2016  . Fatty liver 05/20/2016  . Right flank pain 05/15/2016  . Elevated LFTs 05/02/2016  . Obesity 05/01/2016  . Abdominal discomfort 05/01/2016  . Proteinuria 03/06/2015  . Unspecified vitamin D deficiency 07/14/2013  . LOW BACK PAIN SYNDROME 01/23/2010  . Status post radioactive iodine thyroid ablation 08/17/2009   . Hyperlipidemia 07/26/2008  . ANEMIA-NOS 07/26/2008  . Essential hypertension 07/26/2008  . Pain in joint, site unspecified 07/26/2008  . Goiter 07/14/2007    Isabelle Course, PT, DPT  06/03/2016, 8:42 AM  [facility] [address], [place], [name], [address] Phone: [phone]   Fax:  [phone]  Name: Joan [name] MRN: [id number] Date of Birth: Jan 08, 1956

## 2016-06-06 ENCOUNTER — Ambulatory Visit: Payer: 59 | Admitting: Physical Therapy

## 2016-06-06 DIAGNOSIS — R293 Abnormal posture: Secondary | ICD-10-CM

## 2016-06-06 DIAGNOSIS — M546 Pain in thoracic spine: Secondary | ICD-10-CM

## 2016-06-06 DIAGNOSIS — M6281 Muscle weakness (generalized): Secondary | ICD-10-CM | POA: Diagnosis not present

## 2016-06-06 NOTE — Patient Instructions (Signed)
  [name] PT [facility] [address] [place], [place] [address] Phone # [phone] Fax [phone]      Over Head Pull: Narrow Grip       On back, knees bent, feet flat, band across thighs, elbows straight but relaxed. Pull hands apart (start). Keeping elbows straight, bring arms up and over head, hands toward floor. Keep pull steady on band. Hold momentarily. Return slowly, keeping pull steady, back to start. Repeat _8__ times. Band color ___red___   Side Pull: Double Arm   On back, knees bent, feet flat. Arms perpendicular to body, shoulder level, elbows straight but relaxed. Pull arms out to sides, elbows straight. Resistance band comes across collarbones, hands toward floor. Hold momentarily. Slowly return to starting position. Repeat _8__ times. Band color __red___   Elmer Picker   On back, knees bent, feet flat, left hand on left hip, right hand above left. Pull right arm DIAGONALLY (hip to shoulder) across chest. Bring right arm along head toward floor. Hold momentarily. Slowly return to starting position. Repeat _8__ times. Do with left arm. Band color ___red___   Shoulder Rotation: Double Arm   On back, knees bent, feet flat, elbows tucked at sides, bent 90, hands palms up. Pull hands apart and down toward floor, keeping elbows near sides. Hold momentarily. Slowly return to starting position. Repeat _8__ times. Band color __red____

## 2016-06-06 NOTE — Therapy (Signed)
[facility] [address] [place], [name], [address] Phone: [phone]   Fax:  [phone]  Physical Therapy Treatment  Patient Details  Name: Joan [name] MRN: [id number] Date of Birth: 11/25/1956 Referring Provider: Dr. Quay Burow  Encounter Date: 06/06/2016      PT End of Session - 06/06/16 0843    Visit Number 3   Date for PT Re-Evaluation 07/26/16   PT Start Time 0755   PT Stop Time 0840   PT Time Calculation (min) 45 min   Activity Tolerance Patient tolerated treatment well      Past Medical History  Diagnosis Date  . Hypertension     initially after BCP initiated   . Anemia     due to Dysmenorrhea  . Thyroid disease 2005 & 2009    nodule    Past Surgical History  Procedure Laterality Date  . Flexible sigmoidoscopy  1985     LLQ pain due to ovarian cyst  . Colonoscopy  2009     Dr Annette [facility]  . Abdominal hysterectomy  1998    for heavy menses and fibroids  . Tonsillectomy    . Biopsy thyroid  2005,2009    both negative  . Exploratory laparotomy  1985    Ovarian cyst    There were no vitals filed for this visit.      Subjective Assessment - 06/06/16 0755    Subjective I think the patch helped my shoulder (the dog jerked my arm).  Some mid back pain today 4/10.     Currently in Pain? Yes   Pain Score 5   with movement    Pain Location Shoulder   Pain Orientation Left                         [facility] Adult PT Treatment/Exercise - 06/06/16 0001    Neck Exercises: Supine   Neck Retraction 10 reps   Neck Exercises: Prone   Other Prone Exercise head lift with UE extension and horizontal abduction 5x each   Lumbar Exercises: Supine   Ab Set 10 reps   Isometric Hip Flexion 5 reps   Shoulder Exercises: Supine   Other Supine Exercises red band scapular series 8x each:  overhead with narrow and wide grip, horizontal abduction, sash, ER  discontinued left shoulder  horizontal abduction painful   Shoulder Exercises: Standing   Extension Strengthening;20 reps   Theraband Level (Shoulder Extension) Level 2 (Red)   Row Strengthening;Both;15 reps;Theraband   Theraband Level (Shoulder Row) Level 2 (Red)   Shoulder Exercises: ROM/Strengthening   UBE (Upper Arm Bike) L1 x 4 mins (2/2)_   Other ROM/Strengthening Exercises seated thoracic extension over ball 15x   Iontophoresis   Type of Iontophoresis Dexamethasone   Location Lt pec   Dose 17ml                PT Education - 06/06/16 0841    Education provided Yes   Education Details supine scapular series red band   Person(s) Educated Patient   Methods Explanation;Demonstration;Handout   Comprehension Verbalized understanding;Returned demonstration          PT Short Term Goals - 06/06/16 0934    PT SHORT TERM GOAL #1   Title The patient will demonstrate improved postural correction and safe body mechanics related to osteopenia   06/28/16   Time 4   Period Weeks   Status On-going   PT SHORT  TERM GOAL #2   Title The patient will have improved cervical rotation and thoracic rotation to 35 degrees needed for driving     Time 4   Period Weeks   Status On-going   PT SHORT TERM GOAL #3   Title The patient will report a 25% improvement in pain with standing to cook   Time 4   Period Weeks   Status On-going   PT SHORT TERM GOAL #4   Title Patient will have improved thoracic extension to 5 degrees needed to decrease vertebral body compression and improve UE ovehead use   Time 4   Period Weeks   Status On-going           PT Long Term Goals - 06/06/16 1541    PT LONG TERM GOAL #1   Title The patient will be independent in safe self progression of HEP needed for further improvements in pain, strength, ROM and posture   07/26/16   Time 8   Period Weeks   Status On-going   PT LONG TERM GOAL #2   Title The patient will report a 60% improvement in pain with standing to cook > 30 min   Time  8   Period Weeks   Status On-going   PT LONG TERM GOAL #3   Title The patient will have improved core and scapular muscle strength to grossly 4/5 needed to stand for > 40 min   Time 8   Period Weeks   Status On-going   PT LONG TERM GOAL #4   Title Cervical  rotation improved to 40 degrees needed for driving   Time 8   Period Weeks   Status On-going   PT LONG TERM GOAL #5   Title FOTO functional outcome score improved from 50% limitation to 40% indicating improved function with less pain   Time 8   Period Weeks   Status On-going               Plan - 06/06/16 0844    Clinical Impression Statement The patient has left anterior shoulder pain which limits some overhead shoulder exercises.  She needs some verbal and tactile cues for activation of middle traps.  Patient is receptive to exercise for postural strengthening.  Reports good pain relief from ionto to shoulder last visit.     PT Next Visit Plan Continue ionto patch #3;  postural strengthening;  manual therapy and other modalities as needed;  encourage thoracic extension      Patient will benefit from skilled therapeutic intervention in order to improve the following deficits and impairments:     Visit Diagnosis: Pain in thoracic spine  Muscle weakness (generalized)  Abnormal posture     Problem List Patient Active Problem List   Diagnosis Date Noted  . Gallbladder polyp 05/20/2016  . Fatty liver 05/20/2016  . Right flank pain 05/15/2016  . Elevated LFTs 05/02/2016  . Obesity 05/01/2016  . Abdominal discomfort 05/01/2016  . Proteinuria 03/06/2015  . Unspecified vitamin D deficiency 07/14/2013  . LOW BACK PAIN SYNDROME 01/23/2010  . Status post radioactive iodine thyroid ablation 08/17/2009  . Hyperlipidemia 07/26/2008  . ANEMIA-NOS 07/26/2008  . Essential hypertension 07/26/2008  . Pain in joint, site unspecified 07/26/2008  . Goiter 07/14/2007     [name], PT 06/06/2016 3:43 PM Phone:  [phone] Fax: [phone]  [name] 06/06/2016, 3:42 PM  [facility] [address] [place], [name], [address] Phone: [phone]  Fax:  [phone]  Name: Joan [name] MRN: [id number] Date of Birth: 1956/06/27

## 2016-06-11 ENCOUNTER — Ambulatory Visit: Payer: 59 | Admitting: Physical Therapy

## 2016-06-11 DIAGNOSIS — R293 Abnormal posture: Secondary | ICD-10-CM

## 2016-06-11 DIAGNOSIS — M546 Pain in thoracic spine: Secondary | ICD-10-CM

## 2016-06-11 DIAGNOSIS — M6281 Muscle weakness (generalized): Secondary | ICD-10-CM

## 2016-06-11 NOTE — Therapy (Signed)
[facility] [address] [place], [name], [address] Phone: [phone]   Fax:  [phone]  Physical Therapy Treatment  Patient Details  Name: Joan [name] MRN: [id number] Date of Birth: October 28, 1956 Referring Provider: Dr. Quay Burow  Encounter Date: 06/11/2016      PT End of Session - 06/11/16 0827    Visit Number 4   Date for PT Re-Evaluation 07/26/16   PT Start Time 0802   PT Stop Time 0842   PT Time Calculation (min) 40 min   Activity Tolerance Patient tolerated treatment well      Past Medical History  Diagnosis Date  . Hypertension     initially after BCP initiated   . Anemia     due to Dysmenorrhea  . Thyroid disease 2005 & 2009    nodule    Past Surgical History  Procedure Laterality Date  . Flexible sigmoidoscopy  1985     LLQ pain due to ovarian cyst  . Colonoscopy  2009     Dr Annette [facility]  . Abdominal hysterectomy  1998    for heavy menses and fibroids  . Tonsillectomy    . Biopsy thyroid  2005,2009    both negative  . Exploratory laparotomy  1985    Ovarian cyst    There were no vitals filed for this visit.      Subjective Assessment - 06/11/16 0808    Subjective I'm about the same.  I didn't realize how much I use my left arm.  I can feel it in my mid back when I stand to wash dishes.     Currently in Pain? Yes   Pain Score 3    Pain Location Back   Pain Orientation Mid   Pain Type Chronic pain   Multiple Pain Sites Yes   Pain Score 5   Pain Location Shoulder   Pain Orientation Left   Aggravating Factors  pulling door shut                         [facility] Adult PT Treatment/Exercise - 06/11/16 0001    Neck Exercises: Supine   Other Supine Exercise Foam roll decompression and then added UE motions clasped hands flexion and diagonals 10x each   Other Supine Exercise abdominal brace on foam roll 10x   Shoulder Exercises: Standing   Extension  Strengthening;20 reps   Theraband Level (Shoulder Extension) Level 2 (Red)   Row Strengthening;Both;15 reps;Theraband   Theraband Level (Shoulder Row) Level 2 (Red)   Other Standing Exercises wall push ups plus 15x   Shoulder Exercises: ROM/Strengthening   UBE (Upper Arm Bike) L1 x 5 mins (2/2 1/20_   Other ROM/Strengthening Exercises seated thoracic extension over ball 15x   Other ROM/Strengthening Exercises Nu-Step L1 8 min   Iontophoresis   Type of Iontophoresis Dexamethasone   Location left shoulder   Dose 1 ml   Time 4-6 patch    Manual Therapy   Manual Therapy Joint mobilization   Joint Mobilization Thoracic seated mobs with movement mid level grade 3 10x;  seated upper thoracic mobs with movement grade 3 10x                  PT Short Term Goals - 06/11/16 0853    PT SHORT TERM GOAL #1   Title The patient will demonstrate improved postural correction and safe body mechanics related to osteopenia   06/28/16  Time 4   Period Weeks   Status On-going   PT SHORT TERM GOAL #2   Title The patient will have improved cervical rotation and thoracic rotation to 35 degrees needed for driving     Time 4   Period Weeks   Status On-going   PT SHORT TERM GOAL #3   Title The patient will report a 25% improvement in pain with standing to cook   Time 4   Period Weeks   Status On-going   PT SHORT TERM GOAL #4   Title Patient will have improved thoracic extension to 5 degrees needed to decrease vertebral body compression and improve UE ovehead use   Time 4   Period Weeks   Status On-going           PT Long Term Goals - 06/11/16 [id number]    PT LONG TERM GOAL #1   Title The patient will be independent in safe self progression of HEP needed for further improvements in pain, strength, ROM and posture   07/26/16   Time 8   Period Weeks   Status On-going   PT LONG TERM GOAL #2   Title The patient will report a 60% improvement in pain with standing to cook > 30 min   Time 8    Period Weeks   Status On-going   PT LONG TERM GOAL #3   Title The patient will have improved core and scapular muscle strength to grossly 4/5 needed to stand for > 40 min   Time 8   Period Weeks   Status On-going   PT LONG TERM GOAL #4   Title Cervical  rotation improved to 40 degrees needed for driving   Time 8   Period Weeks   Status On-going   PT LONG TERM GOAL #5   Title FOTO functional outcome score improved from 50% limitation to 40% indicating improved function with less pain   Time 8   Period Weeks   Status On-going               Plan - 06/11/16 0829    Clinical Impression Statement Patient reports unchanging shoulder pain.  Continued third ionto patch, if no benefit by next visit, will discontinue ionto trial.   Decreased thoracic mobility.  Some exercises modified secondary to left shoulder pain.     PT Next Visit Plan Continue ionto patch #4 if helpful otherwise discontinue;  recheck cervical and thoracic rotation ROM;  check progress toward STGs;  postural strengthening and progress HEP while patient on vacation next week;  manual therapy and other modalities as needed;  encourage thoracic extension      Patient will benefit from skilled therapeutic intervention in order to improve the following deficits and impairments:     Visit Diagnosis: Pain in thoracic spine  Muscle weakness (generalized)  Abnormal posture     Problem List Patient Active Problem List   Diagnosis Date Noted  . Gallbladder polyp 05/20/2016  . Fatty liver 05/20/2016  . Right flank pain 05/15/2016  . Elevated LFTs 05/02/2016  . Obesity 05/01/2016  . Abdominal discomfort 05/01/2016  . Proteinuria 03/06/2015  . Unspecified vitamin D deficiency 07/14/2013  . LOW BACK PAIN SYNDROME 01/23/2010  . Status post radioactive iodine thyroid ablation 08/17/2009  . Hyperlipidemia 07/26/2008  . ANEMIA-NOS 07/26/2008  . Essential hypertension 07/26/2008  . Pain in joint, site unspecified  07/26/2008  . Goiter 07/14/2007   [name], PT 06/11/2016 8:57 AM Phone: [phone] Fax: [phone]  [name] 06/11/2016, 8:56 AM  [facility] [address], [place], [name], [address] Phone: [phone]   Fax:  [phone]  Name: Joan [name] MRN: [id number] Date of Birth: 09/07/1956

## 2016-06-14 ENCOUNTER — Ambulatory Visit: Payer: 59 | Admitting: Physical Therapy

## 2016-06-14 DIAGNOSIS — R293 Abnormal posture: Secondary | ICD-10-CM

## 2016-06-14 DIAGNOSIS — M546 Pain in thoracic spine: Secondary | ICD-10-CM | POA: Diagnosis not present

## 2016-06-14 DIAGNOSIS — M6281 Muscle weakness (generalized): Secondary | ICD-10-CM | POA: Diagnosis not present

## 2016-06-14 NOTE — Therapy (Signed)
[facility] [address], [place], [name], [address] Phone: [phone]   Fax:  [phone]  Physical Therapy Treatment  Patient Details  Name: Joan [name] MRN: [id number] Date of Birth: [birth year] Referring Provider: Dr. Quay Burow  Encounter Date: 06/14/2016      PT End of Session - 06/14/16 0833    Visit Number 5   Date for PT Re-Evaluation 07/26/16   PT Start Time 0755   PT Stop Time 0845   PT Time Calculation (min) 50 min   Activity Tolerance Patient tolerated treatment well      Past Medical History  Diagnosis Date  . Hypertension     initially after BCP initiated   . Anemia     due to Dysmenorrhea  . Thyroid disease 2005 & 2009    nodule    Past Surgical History  Procedure Laterality Date  . Flexible sigmoidoscopy  1985     LLQ pain due to ovarian cyst  . Colonoscopy  2009     Dr Annette [facility]  . Abdominal hysterectomy  1998    for heavy menses and fibroids  . Tonsillectomy    . Biopsy thyroid  2005,2009    both negative  . Exploratory laparotomy  1985    Ovarian cyst    There were no vitals filed for this visit.      Subjective Assessment - 06/14/16 0755    Subjective I've been Ok just busy catching up before vacation next week.  Did laser treatment on arm and neck the other day.   No change with ionto patch.  I've been taking Alleve and that covers me.  I've been doing the abdominal bracing when I drive.     Currently in Pain? Yes   Pain Score 3    Pain Location Back   Pain Orientation Mid   Aggravating Factors  standing   Pain Score 4   Pain Location Shoulder   Pain Orientation Left   Aggravating Factors  sleeping on left side;  pulling car door closed            [facility] PT Assessment - 06/14/16 0001    AROM   Right Shoulder Flexion 148 Degrees   Right Shoulder ABduction 150 Degrees   Right Shoulder Internal Rotation --  L3   Right Shoulder External Rotation 78 Degrees    Left Shoulder Flexion 129 Degrees   Left Shoulder ABduction 82 Degrees   Left Shoulder Internal Rotation --  L4   Left Shoulder External Rotation 70 Degrees   Cervical Flexion 45   Cervical Extension 75   Cervical - Right Side Bend 35   Cervical - Left Side Bend 25   Cervical - Right Rotation 38   Cervical - Left Rotation 38   Thoracic Extension 10   Thoracic - Right Rotation 35   Thoracic - Left Rotation 50                     [facility] Adult PT Treatment/Exercise - 06/14/16 0001    Neck Exercises: Machines for Strengthening   UBE (Upper Arm Bike) 6 min   Other Machines for Strengthening Nu-Step L1 8 min   Neck Exercises: Seated   Other Seated Exercise thoracic extension over ball 20x   Neck Exercises: Supine   Neck Retraction 10 reps  on red ball   Neck Exercises: Prone   Other Prone Exercise head lift with UE extension and horizontal abduction  8x each   Shoulder Exercises: Standing   Extension Strengthening;20 reps   Theraband Level (Shoulder Extension) Level 2 (Red)   Row Strengthening;Both;15 reps;Theraband   Theraband Level (Shoulder Row) Level 2 (Red)   Retraction --  tricep exten red band 20x   Other Standing Exercises wall push ups plus 15x   Other Standing Exercises right/left ball roll uphill 20x                   PT Short Term Goals - 06/14/16 2423    PT SHORT TERM GOAL #1   Title The patient will demonstrate improved postural correction and safe body mechanics related to osteopenia   06/28/16   Status Achieved   PT SHORT TERM GOAL #2   Title The patient will have improved cervical rotation and thoracic rotation to 35 degrees needed for driving     Time 4   Period Weeks   Status Partially Met   PT SHORT TERM GOAL #3   Title The patient will report a 25% improvement in pain with standing to cook   Time 4   Period Weeks   Status On-going   PT SHORT TERM GOAL #4   Title Patient will have improved thoracic extension to 5 degrees needed  to decrease vertebral body compression and improve UE ovehead use   Status Achieved           PT Long Term Goals - 06/14/16 5361    PT LONG TERM GOAL #1   Title The patient will be independent in safe self progression of HEP needed for further improvements in pain, strength, ROM and posture   07/26/16   Time 8   Period Weeks   Status On-going   PT LONG TERM GOAL #2   Title The patient will report a 60% improvement in pain with standing to cook > 30 min   Time 8   Period Weeks   Status On-going   PT LONG TERM GOAL #3   Title The patient will have improved core and scapular muscle strength to grossly 4/5 needed to stand for > 40 min   Time 8   Period Weeks   Status On-going   PT LONG TERM GOAL #4   Title Cervical  rotation improved to 40 degrees needed for driving   Time 8   Period Weeks   Status On-going   PT LONG TERM GOAL #5   Title FOTO functional outcome score improved from 50% limitation to 40% indicating improved function with less pain   Time 8   Period Weeks   Status On-going               Plan - 06/14/16 4431    Clinical Impression Statement Progressing toward STGS.  Good improvements in cervical AROM especially extension, sidebending and rotation.  Good improvement in thoracic extension ROM and modest improvements in left shoulder ROM but still painful.  Therapist providing verbal and tactile cues for scapular muscle activation and  modifying for shoulder pain.  Discontinue ionto since no major changes.     Clinical Impairments Affecting Rehab Potential osteopenia    PT Next Visit Plan manual therapy for thoracic and glenohumeral joints; postural strengthening;  Try kinesiotape      Patient will benefit from skilled therapeutic intervention in order to improve the following deficits and impairments:     Visit Diagnosis: Pain in thoracic spine  Muscle weakness (generalized)  Abnormal posture     Problem List Patient  Active Problem List    Diagnosis Date Noted  . Gallbladder polyp 05/20/2016  . Fatty liver 05/20/2016  . Right flank pain 05/15/2016  . Elevated LFTs 05/02/2016  . Obesity 05/01/2016  . Abdominal discomfort 05/01/2016  . Proteinuria 03/06/2015  . Unspecified vitamin D deficiency 07/14/2013  . LOW BACK PAIN SYNDROME 01/23/2010  . Status post radioactive iodine thyroid ablation 08/17/2009  . Hyperlipidemia 07/26/2008  . ANEMIA-NOS 07/26/2008  . Essential hypertension 07/26/2008  . Pain in joint, site unspecified 07/26/2008  . Goiter 07/14/2007   [name], PT 06/14/2016 8:44 AM Phone: [phone] Fax: [phone]  [name] 06/14/2016, 8:44 AM  [facility] [address], [place], [name], [address] Phone: [phone]   Fax:  [phone]  Name: Joan [name] MRN: [id number] Date of Birth: 08/29/1956

## 2016-06-24 ENCOUNTER — Ambulatory Visit: Payer: 59 | Admitting: Physical Therapy

## 2016-06-24 ENCOUNTER — Encounter: Payer: Self-pay | Admitting: Physical Therapy

## 2016-06-24 DIAGNOSIS — M6281 Muscle weakness (generalized): Secondary | ICD-10-CM | POA: Diagnosis not present

## 2016-06-24 DIAGNOSIS — M546 Pain in thoracic spine: Secondary | ICD-10-CM | POA: Diagnosis not present

## 2016-06-24 DIAGNOSIS — R293 Abnormal posture: Secondary | ICD-10-CM

## 2016-06-24 NOTE — Therapy (Signed)
[facility] [address] [place], [name], [address] Phone: [phone]   Fax:  [phone]  Physical Therapy Treatment  Patient Details  Name: Joan [name] MRN: [id number] Date of Birth: Aug 05, 1956 Referring Provider: Dr. Quay Burow  Encounter Date: 06/24/2016      PT End of Session - 06/24/16 0836    Visit Number 6   Date for PT Re-Evaluation 07/26/16   PT Start Time 0759   PT Stop Time 0840   PT Time Calculation (min) 41 min   Activity Tolerance Patient tolerated treatment well   Behavior During Therapy [facility] for tasks assessed/performed      Past Medical History:  Diagnosis Date  . Anemia    due to Dysmenorrhea  . Hypertension    initially after BCP initiated   . Thyroid disease 2005 & 2009   nodule    Past Surgical History:  Procedure Laterality Date  . ABDOMINAL HYSTERECTOMY  1998   for heavy menses and fibroids  . BIOPSY THYROID  2005,2009   both negative  . COLONOSCOPY  2009    Dr Annette [facility]  . EXPLORATORY LAPAROTOMY  1985   Ovarian cyst  . [place]    LLQ pain due to ovarian cyst  . TONSILLECTOMY      There were no vitals filed for this visit.      Subjective Assessment - 06/24/16 0802    Subjective I had a lot less pain on vacation, I was able to cook without too much increase in pain, I didn't have to lay down after cooking like a usually do   Pertinent History hx of hip and shoulder bursitis;  osteopenia   Diagnostic tests U/S right side showed fatty liver;  bone density shows osteopenia   Pain Score 3    Pain Location Back   Pain Orientation Right   Pain Descriptors / Indicators Aching   Pain Type Chronic pain   Pain Onset More than a month ago                         [facility] Adult PT Treatment/Exercise - 06/24/16 0001      Neck Exercises: Machines for Strengthening   UBE (Upper Arm Bike) 6 min (3/3)   Other Machines for Strengthening  nu step level 2 x 8 minutes     Shoulder Exercises: Standing   Extension Strengthening;20 reps   Theraband Level (Shoulder Extension) Level 2 (Red)   Row Strengthening;20 reps  cues for posture   Theraband Level (Shoulder Row) Level 2 (Red)   Retraction --  tricep extend red band x 20   Other Standing Exercises wall push ups x 20   Other Standing Exercises roll ball up wall for shoulder stretching x 15     Manual Therapy   Manual Therapy Joint mobilization   Joint Mobilization Lt shoulder into abduction,, ER, grade IIIs for mobility                  PT Short Term Goals - 06/24/16 0805      PT SHORT TERM GOAL #1   Title The patient will demonstrate improved postural correction and safe body mechanics related to osteopenia   06/28/16   Status Achieved     PT SHORT TERM GOAL #2   Title The patient will have improved cervical rotation and thoracic rotation to 35 degrees needed for driving     Status  Partially Met     PT SHORT TERM GOAL #3   Title The patient will report a 25% improvement in pain with standing to cook   Status Achieved     PT SHORT TERM GOAL #4   Title Patient will have improved thoracic extension to 5 degrees needed to decrease vertebral body compression and improve UE ovehead use   Status Achieved           PT Long Term Goals - 06/24/16 7619      PT LONG TERM GOAL #1   Title The patient will be independent in safe self progression of HEP needed for further improvements in pain, strength, ROM and posture   07/26/16   Status On-going     PT LONG TERM GOAL #2   Title The patient will report a 60% improvement in pain with standing to cook > 30 min   Status On-going     PT LONG TERM GOAL #3   Title The patient will have improved core and scapular muscle strength to grossly 4/5 needed to stand for > 40 min   Status On-going     PT LONG TERM GOAL #4   Title Cervical  rotation improved to 40 degrees needed for driving   Status On-going     PT  LONG TERM GOAL #5   Title FOTO functional outcome score improved from 50% limitation to 40% indicating improved function with less pain   Status On-going               Plan - 06/24/16 0837    Clinical Impression Statement Pt met short term goal for standing to cook with decreased pain, still with decreased Lt shoulder ROM and decreased thoracic mobility, PT provides tactile cuing for scapular mm activation during exercises   Rehab Potential Good   Clinical Impairments Affecting Rehab Potential osteopenia    PT Frequency 2x / week   PT Duration 8 weeks   PT Treatment/Interventions ADLs/Self Care Home Management;Iontophoresis 83m/ml Dexamethasone;Cryotherapy;Electrical Stimulation;Moist Heat;Ultrasound;Therapeutic exercise;Neuromuscular re-education;Patient/family education;Manual techniques;Dry needling;Taping   PT Next Visit Plan continue manual for thoracic, and left shoulder, postural strengthening   Consulted and Agree with Plan of Care Patient      Patient will benefit from skilled therapeutic intervention in order to improve the following deficits and impairments:  Decreased range of motion, Decreased strength, Pain, Postural dysfunction, Impaired UE functional use  Visit Diagnosis: Pain in thoracic spine  Muscle weakness (generalized)  Abnormal posture     Problem List Patient Active Problem List   Diagnosis Date Noted  . Gallbladder polyp 05/20/2016  . Fatty liver 05/20/2016  . Right flank pain 05/15/2016  . Elevated LFTs 05/02/2016  . Obesity 05/01/2016  . Abdominal discomfort 05/01/2016  . Proteinuria 03/06/2015  . Unspecified vitamin D deficiency 07/14/2013  . LOW BACK PAIN SYNDROME 01/23/2010  . Status post radioactive iodine thyroid ablation 08/17/2009  . Hyperlipidemia 07/26/2008  . ANEMIA-NOS 07/26/2008  . Essential hypertension 07/26/2008  . Pain in joint, site unspecified 07/26/2008  . Goiter 07/14/2007    KIsabelle Course PT, DPT 06/24/2016,  8:39 AM  [facility] [address] [place] [address]: [phone]  Fax:  [phone] Name: Joan MealorMRN: [id number] of Birth: [birth year]

## 2016-07-05 ENCOUNTER — Ambulatory Visit: Payer: 59 | Attending: Internal Medicine | Admitting: Physical Therapy

## 2016-07-05 ENCOUNTER — Encounter: Payer: Self-pay | Admitting: Physical Therapy

## 2016-07-05 DIAGNOSIS — M546 Pain in thoracic spine: Secondary | ICD-10-CM | POA: Insufficient documentation

## 2016-07-05 DIAGNOSIS — M6281 Muscle weakness (generalized): Secondary | ICD-10-CM | POA: Diagnosis not present

## 2016-07-05 DIAGNOSIS — R293 Abnormal posture: Secondary | ICD-10-CM | POA: Insufficient documentation

## 2016-07-05 DIAGNOSIS — M25512 Pain in left shoulder: Secondary | ICD-10-CM | POA: Diagnosis not present

## 2016-07-05 NOTE — Patient Instructions (Signed)
Over Head Pull: Narrow Grip       On back, knees bent, feet flat, band across thighs, elbows straight but relaxed. Pull hands apart (start). Keeping elbows straight, bring arms up and over head, hands toward floor. Keep pull steady on band. Hold momentarily. Return slowly, keeping pull steady, back to start. Repeat _10__ times. Band color __red____   Side Pull: Double Arm   On back, knees bent, feet flat. Arms perpendicular to body, shoulder level, elbows straight but relaxed. Pull arms out to sides, elbows straight. Resistance band comes across collarbones, hands toward floor. Hold momentarily. Slowly return to starting position. Repeat _10__ times. Band color _red____   Sash   On back, knees bent, feet flat, left hand on left hip, right hand above left. Pull right arm DIAGONALLY (hip to shoulder) across chest. Bring right arm along head toward floor. Hold momentarily. Slowly return to starting position. Repeat _10__ times. Do with left arm. Band color __red____   Shoulder Rotation: Double Arm   On back, knees bent, feet flat, elbows tucked at sides, bent 90, hands palms up. Pull hands apart and down toward floor, keeping elbows near sides. Hold momentarily. Slowly return to starting position. Repeat _10__ times. Band color __red____    Stacy Simpson PT [facility] [address], [address] [place],  [address] Phone # [phone] Fax [phone]  

## 2016-07-05 NOTE — Therapy (Signed)
[facility] [address], [place], [name], [address] Phone: [phone]   Fax:  [phone]  Physical Therapy Treatment  Patient Details  Name: Joan [name] MRN: [id number] Date of Birth: Nov 24, 1956 Referring Provider: Dr. Quay Burow  Encounter Date: 07/05/2016   Visit #7  Re-Eval 07/26/2016     PT End of Session - 07/05/16 0821    PT Start Time 0800   PT Stop Time 0840   PT Time Calculation (min) 40 min      Past Medical History:  Diagnosis Date  . Anemia    due to Dysmenorrhea  . Hypertension    initially after BCP initiated   . Thyroid disease 2005 & 2009   nodule    Past Surgical History:  Procedure Laterality Date  . ABDOMINAL HYSTERECTOMY  1998   for heavy menses and fibroids  . BIOPSY THYROID  2005,2009   both negative  . COLONOSCOPY  2009    Dr Annette [facility]  . EXPLORATORY LAPAROTOMY  1985   Ovarian cyst  . [place]    LLQ pain due to ovarian cyst  . TONSILLECTOMY      There were no vitals filed for this visit.      Subjective Assessment - 07/05/16 0805    Subjective Pain in mid/lower back. Shoulder pain is improving but continues. Pt is stretching at home.  Overall pt reports feeling 80% better.   Pain Score 3    Pain Location Back   Pain Orientation Right;Mid;Lower   Pain Descriptors / Indicators Aching   Pain Type Chronic pain   Aggravating Factors  Standing   Pain Relieving Factors Alleve            [facility] PT Assessment - 07/05/16 0001      AROM   Thoracic Extension 20   Thoracic - Right Rotation 45   Thoracic - Left Rotation 50                     [facility] Adult PT Treatment/Exercise - 07/05/16 0001      Neck Exercises: Machines for Strengthening   UBE (Upper Arm Bike) 6 min 3/3   Other Machines for Strengthening Nu Step L1 6 mins     Neck Exercises: Seated   Other Seated Exercise thoracic extension over ball 20x     Neck  Exercises: Supine   Other Supine Exercise Red band narrow grip/ wide grip overhead x10     Neck Exercises: Prone   Other Prone Exercise head lift with UE extension and horizontal abduction 8x each                PT Education - 07/05/16 0828    Education provided Yes   Education Details supine scapular sereies red band   Person(s) Educated Patient   Methods Explanation;Handout   Comprehension Verbalized understanding          PT Short Term Goals - 07/05/16 0829      PT SHORT TERM GOAL #1   Title The patient will demonstrate improved postural correction and safe body mechanics related to osteopenia   06/28/16   Status Achieved     PT SHORT TERM GOAL #2   Title The patient will have improved cervical rotation and thoracic rotation to 35 degrees needed for driving     Status Achieved     PT SHORT TERM GOAL #3   Title The patient will report a 25%  improvement in pain with standing to cook   Status Achieved     PT SHORT TERM GOAL #4   Title Patient will have improved thoracic extension to 5 degrees needed to decrease vertebral body compression and improve UE ovehead use   Status Achieved           PT Long Term Goals - 07/05/16 0830      PT LONG TERM GOAL #1   Title The patient will be independent in safe self progression of HEP needed for further improvements in pain, strength, ROM and posture   07/26/16   Time 8   Period Weeks   Status On-going     PT LONG TERM GOAL #2   Title The patient will report a 60% improvement in pain with standing to cook > 30 min   Time 8   Period Weeks   Status On-going     PT LONG TERM GOAL #3   Title The patient will have improved core and scapular muscle strength to grossly 4/5 needed to stand for > 40 min   Time 8   Period Weeks   Status On-going     PT LONG TERM GOAL #4   Title Cervical  rotation improved to 40 degrees needed for driving   Time 8   Period Weeks   Status On-going     PT LONG TERM GOAL #5   Title FOTO  functional outcome score improved from 50% limitation to 40% indicating improved function with less pain   Time 8   Period Weeks   Status On-going               Plan - 07/05/16 0848    Clinical Impression Statement Pt thoracic extension has improved. Pt has met short term goal for thoracic ROM. Therapist provided verbal cues for posture and education on importance of back and shoulder stability   PT Next Visit Plan Continue to improve strength and stability in thoracic and left shoulder. Continue to increase posture and progress towards goals.       Patient will benefit from skilled therapeutic intervention in order to improve the following deficits and impairments:     Visit Diagnosis: Pain in thoracic spine  Muscle weakness (generalized)  Abnormal posture     Problem List Patient Active Problem List   Diagnosis Date Noted  . Gallbladder polyp 05/20/2016  . Fatty liver 05/20/2016  . Right flank pain 05/15/2016  . Elevated LFTs 05/02/2016  . Obesity 05/01/2016  . Abdominal discomfort 05/01/2016  . Proteinuria 03/06/2015  . Unspecified vitamin D deficiency 07/14/2013  . LOW BACK PAIN SYNDROME 01/23/2010  . Status post radioactive iodine thyroid ablation 08/17/2009  . Hyperlipidemia 07/26/2008  . ANEMIA-NOS 07/26/2008  . Essential hypertension 07/26/2008  . Pain in joint, site unspecified 07/26/2008  . Goiter 07/14/2007   [name], PT 07/05/16 8:56 AM Phone: [phone] Fax: [phone] Joan [name] 07/05/2016, 8:54 AM  [facility] [address], [place], [name], [address] Phone: [phone]   Fax:  [phone]  Name: Joan [name] MRN: [id number] Date of Birth: 01/18/1956

## 2016-07-05 NOTE — Therapy (Signed)
[facility] [address] [place], [name], [address] Phone: [phone]   Fax:  [phone]  Physical Therapy Treatment  Patient Details  Name: Joan [name] MRN: [id number] Date of Birth: 12-16-1955 Referring Provider: Dr. Quay Burow  Encounter Date: 07/05/2016      PT End of Session - 07/05/16 0821    PT Start Time 0800   PT Stop Time 0840   PT Time Calculation (min) 40 min      Past Medical History:  Diagnosis Date  . Anemia    due to Dysmenorrhea  . Hypertension    initially after BCP initiated   . Thyroid disease 2005 & 2009   nodule    Past Surgical History:  Procedure Laterality Date  . ABDOMINAL HYSTERECTOMY  1998   for heavy menses and fibroids  . BIOPSY THYROID  2005,2009   both negative  . COLONOSCOPY  2009    Dr Annette [facility]  . EXPLORATORY LAPAROTOMY  1985   Ovarian cyst  . [place]    LLQ pain due to ovarian cyst  . TONSILLECTOMY      There were no vitals filed for this visit.      Subjective Assessment - 07/05/16 0805    Subjective Pain in mid/lower back. Shoulder pain is improving but continues. Pt is stretching at home.  Overall pt reports feeling 80% better.   Pain Score 3    Pain Location Back   Pain Orientation Right;Mid;Lower   Pain Descriptors / Indicators Aching   Pain Type Chronic pain   Aggravating Factors  Standing   Pain Relieving Factors Alleve            [facility] PT Assessment - 07/05/16 0001      AROM   Thoracic Extension 20   Thoracic - Right Rotation 45   Thoracic - Left Rotation 50                     [facility] Adult PT Treatment/Exercise - 07/05/16 0001      Neck Exercises: Machines for Strengthening   UBE (Upper Arm Bike) 6 min 3/3   Other Machines for Strengthening Nu Step L1 6 mins     Neck Exercises: Seated   Other Seated Exercise thoracic extension over ball 20x     Neck Exercises: Supine   Other Supine  Exercise Red band narrow grip/ wide grip overhead x10     Neck Exercises: Prone   Other Prone Exercise head lift with UE extension and horizontal abduction 8x each                PT Education - 07/05/16 0828    Education provided Yes   Education Details supine scapular sereies red band   Person(s) Educated Patient   Methods Explanation;Handout   Comprehension Verbalized understanding          PT Short Term Goals - 07/05/16 0829      PT SHORT TERM GOAL #1   Title The patient will demonstrate improved postural correction and safe body mechanics related to osteopenia   06/28/16   Status Achieved     PT SHORT TERM GOAL #2   Title The patient will have improved cervical rotation and thoracic rotation to 35 degrees needed for driving     Status Achieved     PT SHORT TERM GOAL #3   Title The patient will report a 25% improvement in pain with standing to  cook   Status Achieved     PT SHORT TERM GOAL #4   Title Patient will have improved thoracic extension to 5 degrees needed to decrease vertebral body compression and improve UE ovehead use   Status Achieved           PT Long Term Goals - 07/05/16 0830      PT LONG TERM GOAL #1   Title The patient will be independent in safe self progression of HEP needed for further improvements in pain, strength, ROM and posture   07/26/16   Time 8   Period Weeks   Status On-going     PT LONG TERM GOAL #2   Title The patient will report a 60% improvement in pain with standing to cook > 30 min   Time 8   Period Weeks   Status On-going     PT LONG TERM GOAL #3   Title The patient will have improved core and scapular muscle strength to grossly 4/5 needed to stand for > 40 min   Time 8   Period Weeks   Status On-going     PT LONG TERM GOAL #4   Title Cervical  rotation improved to 40 degrees needed for driving   Time 8   Period Weeks   Status On-going     PT LONG TERM GOAL #5   Title FOTO functional outcome score improved  from 50% limitation to 40% indicating improved function with less pain   Time 8   Period Weeks   Status On-going               Plan - 07/05/16 0848    Clinical Impression Statement Pt thoracic extension has improved. Pt has met short term goal for thoracic ROM. Therapist provided verbal cues for posture and education on importance of back and shoulder stability   PT Next Visit Plan Continue to improve strength and stability in thoracic and left shoulder. Continue to increase posture and progress towards goals.       Patient will benefit from skilled therapeutic intervention in order to improve the following deficits and impairments:     Visit Diagnosis: Pain in thoracic spine  Muscle weakness (generalized)  Abnormal posture     Problem List Patient Active Problem List   Diagnosis Date Noted  . Gallbladder polyp 05/20/2016  . Fatty liver 05/20/2016  . Right flank pain 05/15/2016  . Elevated LFTs 05/02/2016  . Obesity 05/01/2016  . Abdominal discomfort 05/01/2016  . Proteinuria 03/06/2015  . Unspecified vitamin D deficiency 07/14/2013  . LOW BACK PAIN SYNDROME 01/23/2010  . Status post radioactive iodine thyroid ablation 08/17/2009  . Hyperlipidemia 07/26/2008  . ANEMIA-NOS 07/26/2008  . Essential hypertension 07/26/2008  . Pain in joint, site unspecified 07/26/2008  . Goiter 07/14/2007    [name] 07/05/2016, 8:53 AM  [facility] [address] [place], [name], [address] Phone: [phone]   Fax:  [phone]  Name: Joan [name] MRN: [id number] Date of Birth: 21-Oct-1956

## 2016-07-08 ENCOUNTER — Encounter: Payer: Self-pay | Admitting: Internal Medicine

## 2016-07-08 ENCOUNTER — Ambulatory Visit (INDEPENDENT_AMBULATORY_CARE_PROVIDER_SITE_OTHER): Payer: 59 | Admitting: Internal Medicine

## 2016-07-08 VITALS — BP 130/82 | HR 69 | Temp 98.1°F | Resp 16 | Ht 64.0 in | Wt 204.0 lb

## 2016-07-08 DIAGNOSIS — I10 Essential (primary) hypertension: Secondary | ICD-10-CM

## 2016-07-08 DIAGNOSIS — M25512 Pain in left shoulder: Secondary | ICD-10-CM | POA: Diagnosis not present

## 2016-07-08 DIAGNOSIS — Z0000 Encounter for general adult medical examination without abnormal findings: Secondary | ICD-10-CM

## 2016-07-08 DIAGNOSIS — R945 Abnormal results of liver function studies: Secondary | ICD-10-CM

## 2016-07-08 DIAGNOSIS — E785 Hyperlipidemia, unspecified: Secondary | ICD-10-CM | POA: Diagnosis not present

## 2016-07-08 DIAGNOSIS — R7989 Other specified abnormal findings of blood chemistry: Secondary | ICD-10-CM

## 2016-07-08 MED ORDER — DICLOFENAC SODIUM 2 % TD SOLN: TRANSDERMAL | 5 refills | Status: DC

## 2016-07-08 NOTE — Patient Instructions (Addendum)
All other Health Maintenance issues reviewed.   All recommended immunizations and age-appropriate screenings are up-to-date or discussed.  No immunizations administered today.   Medications reviewed and updated.  No changes recommended at this time.  A referral was ordered for physical therapy.    Please followup in one year   Health Maintenance, Female Adopting a healthy lifestyle and getting preventive care can go a long way to promote health and wellness. Talk with your health care provider about what schedule of regular examinations is right for you. This is a good chance for you to check in with your provider about disease prevention and staying healthy. In between checkups, there are plenty of things you can do on your own. Experts have done a lot of research about which lifestyle changes and preventive measures are most likely to keep you healthy. Ask your health care provider for more information. WEIGHT AND DIET  Eat a healthy diet  Be sure to include plenty of vegetables, fruits, low-fat dairy products, and lean protein.  Do not eat a lot of foods high in solid fats, added sugars, or salt.  Get regular exercise. This is one of the most important things you can do for your health.  Most adults should exercise for at least 150 minutes each week. The exercise should increase your heart rate and make you sweat (moderate-intensity exercise).  Most adults should also do strengthening exercises at least twice a week. This is in addition to the moderate-intensity exercise.  Maintain a healthy weight  Body mass index (BMI) is a measurement that can be used to identify possible weight problems. It estimates body fat based on height and weight. Your health care provider can help determine your BMI and help you achieve or maintain a healthy weight.  For females 10 years of age and older:   A BMI below 18.5 is considered underweight.  A BMI of 18.5 to 24.9 is normal.  A BMI of 25  to 29.9 is considered overweight.  A BMI of 30 and above is considered obese.  Watch levels of cholesterol and blood lipids  You should start having your blood tested for lipids and cholesterol at 62 years of age, then have this test every 5 years.  You may need to have your cholesterol levels checked more often if:  Your lipid or cholesterol levels are high.  You are older than 62 years of age.  You are at high risk for heart disease.  CANCER SCREENING   Lung Cancer  Lung cancer screening is recommended for adults 65-33 years old who are at high risk for lung cancer because of a history of smoking.  A yearly low-dose CT scan of the lungs is recommended for people who:  Currently smoke.  Have quit within the past 15 years.  Have at least a 30-pack-year history of smoking. A pack year is smoking an average of one pack of cigarettes a day for 1 year.  Yearly screening should continue until it has been 15 years since you quit.  Yearly screening should stop if you develop a health problem that would prevent you from having lung cancer treatment.  Breast Cancer  Practice breast self-awareness. This means understanding how your breasts normally appear and feel.  It also means doing regular breast self-exams. Let your health care provider know about any changes, no matter how small.  If you are in your 20s or 30s, you should have a clinical breast exam (CBE) by a health care  provider every 1-3 years as part of a regular health exam.  If you are 65 or older, have a CBE every year. Also consider having a breast X-ray (mammogram) every year.  If you have a family history of breast cancer, talk to your health care provider about genetic screening.  If you are at high risk for breast cancer, talk to your health care provider about having an MRI and a mammogram every year.  Breast cancer gene (BRCA) assessment is recommended for women who have family members with BRCA-related  cancers. BRCA-related cancers include:  Breast.  Ovarian.  Tubal.  Peritoneal cancers.  Results of the assessment will determine the need for genetic counseling and BRCA1 and BRCA2 testing. Cervical Cancer Your health care provider may recommend that you be screened regularly for cancer of the pelvic organs (ovaries, uterus, and vagina). This screening involves a pelvic examination, including checking for microscopic changes to the surface of your cervix (Pap test). You may be encouraged to have this screening done every 3 years, beginning at age 68.  For women ages 65-65, health care providers may recommend pelvic exams and Pap testing every 3 years, or they may recommend the Pap and pelvic exam, combined with testing for human papilloma virus (HPV), every 5 years. Some types of HPV increase your risk of cervical cancer. Testing for HPV may also be done on women of any age with unclear Pap test results.  Other health care providers may not recommend any screening for nonpregnant women who are considered low risk for pelvic cancer and who do not have symptoms. Ask your health care provider if a screening pelvic exam is right for you.  If you have had past treatment for cervical cancer or a condition that could lead to cancer, you need Pap tests and screening for cancer for at least 20 years after your treatment. If Pap tests have been discontinued, your risk factors (such as having a new sexual partner) need to be reassessed to determine if screening should resume. Some women have medical problems that increase the chance of getting cervical cancer. In these cases, your health care provider may recommend more frequent screening and Pap tests. Colorectal Cancer  This type of cancer can be detected and often prevented.  Routine colorectal cancer screening usually begins at 62 years of age and continues through 62 years of age.  Your health care provider may recommend screening at an earlier  age if you have risk factors for colon cancer.  Your health care provider may also recommend using home test kits to check for hidden blood in the stool.  A small camera at the end of a tube can be used to examine your colon directly (sigmoidoscopy or colonoscopy). This is done to check for the earliest forms of colorectal cancer.  Routine screening usually begins at age 1.  Direct examination of the colon should be repeated every 5-10 years through 62 years of age. However, you may need to be screened more often if early forms of precancerous polyps or small growths are found. Skin Cancer  Check your skin from head to toe regularly.  Tell your health care provider about any new moles or changes in moles, especially if there is a change in a mole's shape or color.  Also tell your health care provider if you have a mole that is larger than the size of a pencil eraser.  Always use sunscreen. Apply sunscreen liberally and repeatedly throughout the day.  Protect yourself  by wearing long sleeves, pants, a wide-brimmed hat, and sunglasses whenever you are outside. HEART DISEASE, DIABETES, AND HIGH BLOOD PRESSURE   High blood pressure causes heart disease and increases the risk of stroke. High blood pressure is more likely to develop in:  People who have blood pressure in the high end of the normal range (130-139/85-89 mm Hg).  People who are overweight or obese.  People who are African American.  If you are 38-76 years of age, have your blood pressure checked every 3-5 years. If you are 7 years of age or older, have your blood pressure checked every year. You should have your blood pressure measured twice--once when you are at a hospital or clinic, and once when you are not at a hospital or clinic. Record the average of the two measurements. To check your blood pressure when you are not at a hospital or clinic, you can use:  An automated blood pressure machine at a pharmacy.  A home  blood pressure monitor.  If you are between 76 years and 57 years old, ask your health care provider if you should take aspirin to prevent strokes.  Have regular diabetes screenings. This involves taking a blood sample to check your fasting blood sugar level.  If you are at a normal weight and have a low risk for diabetes, have this test once every three years after 62 years of age.  If you are overweight and have a high risk for diabetes, consider being tested at a younger age or more often. PREVENTING INFECTION  Hepatitis B  If you have a higher risk for hepatitis B, you should be screened for this virus. You are considered at high risk for hepatitis B if:  You were born in a country where hepatitis B is common. Ask your health care provider which countries are considered high risk.  Your parents were born in a high-risk country, and you have not been immunized against hepatitis B (hepatitis B vaccine).  You have HIV or AIDS.  You use needles to inject street drugs.  You live with someone who has hepatitis B.  You have had sex with someone who has hepatitis B.  You get hemodialysis treatment.  You take certain medicines for conditions, including cancer, organ transplantation, and autoimmune conditions. Hepatitis C  Blood testing is recommended for:  Everyone born from [birth year] through 1967.  Anyone with known risk factors for hepatitis C. Sexually transmitted infections (STIs)  You should be screened for sexually transmitted infections (STIs) including gonorrhea and chlamydia if:  You are sexually active and are younger than 62 years of age.  You are older than 62 years of age and your health care provider tells you that you are at risk for this type of infection.  Your sexual activity has changed since you were last screened and you are at an increased risk for chlamydia or gonorrhea. Ask your health care provider if you are at risk.  If you do not have HIV, but are at  risk, it may be recommended that you take a prescription medicine daily to prevent HIV infection. This is called pre-exposure prophylaxis (PrEP). You are considered at risk if:  You are sexually active and do not regularly use condoms or know the HIV status of your partner(s).  You take drugs by injection.  You are sexually active with a partner who has HIV. Talk with your health care provider about whether you are at high risk of being infected with HIV.  If you choose to begin PrEP, you should first be tested for HIV. You should then be tested every 3 months for as long as you are taking PrEP.  PREGNANCY   If you are premenopausal and you may become pregnant, ask your health care provider about preconception counseling.  If you may become pregnant, take 400 to 800 micrograms (mcg) of folic acid every day.  If you want to prevent pregnancy, talk to your health care provider about birth control (contraception). OSTEOPOROSIS AND MENOPAUSE   Osteoporosis is a disease in which the bones lose minerals and strength with aging. This can result in serious bone fractures. Your risk for osteoporosis can be identified using a bone density scan.  If you are 45 years of age or older, or if you are at risk for osteoporosis and fractures, ask your health care provider if you should be screened.  Ask your health care provider whether you should take a calcium or vitamin D supplement to lower your risk for osteoporosis.  Menopause may have certain physical symptoms and risks.  Hormone replacement therapy may reduce some of these symptoms and risks. Talk to your health care provider about whether hormone replacement therapy is right for you.  HOME CARE INSTRUCTIONS   Schedule regular health, dental, and eye exams.  Stay current with your immunizations.   Do not use any tobacco products including cigarettes, chewing tobacco, or electronic cigarettes.  If you are pregnant, do not drink alcohol.  If  you are breastfeeding, limit how much and how often you drink alcohol.  Limit alcohol intake to no more than 1 drink per day for nonpregnant women. One drink equals 12 ounces of beer, 5 ounces of wine, or 1 ounces of hard liquor.  Do not use street drugs.  Do not share needles.  Ask your health care provider for help if you need support or information about quitting drugs.  Tell your health care provider if you often feel depressed.  Tell your health care provider if you have ever been abused or do not feel safe at home.   This information is not intended to replace advice given to you by your health care provider. Make sure you discuss any questions you have with your health care provider.   Document Released: 05/27/2011 Document Revised: 12/02/2014 Document Reviewed: 10/13/2013 Elsevier Interactive Patient Education [employer].

## 2016-07-08 NOTE — Assessment & Plan Note (Signed)
Refer for PT voltaren gel as needed twice daily

## 2016-07-08 NOTE — Progress Notes (Signed)
Patient received education resource, including the self-management goal and tool. Patient verbalized understanding. 

## 2016-07-08 NOTE — Progress Notes (Signed)
Subjective:    Patient ID: Joan [name], female    DOB: [birth year], 60 y.o.   MRN: [id number]  HPI She is here for a physical exam.   She knows she needs to lose weight and is not exercising as much as she should.  She has stopped taking her cholesterol medication - her endocrinologist will recheck it next month.  She will restart it if needed.    She has no concerns.    Medications and allergies reviewed with patient and updated if appropriate.  Patient Active Problem List   Diagnosis Date Noted  . Gallbladder polyp 05/20/2016  . Fatty liver 05/20/2016  . Right flank pain 05/15/2016  . Elevated LFTs 05/02/2016  . Obesity 05/01/2016  . Abdominal discomfort 05/01/2016  . Proteinuria 03/06/2015  . Unspecified vitamin D deficiency 07/14/2013  . LOW BACK PAIN SYNDROME 01/23/2010  . Status post radioactive iodine thyroid ablation 08/17/2009  . Hyperlipidemia 07/26/2008  . ANEMIA-NOS 07/26/2008  . Essential hypertension 07/26/2008  . Pain in joint, site unspecified 07/26/2008  . Goiter 07/14/2007    Current Outpatient Prescriptions on File Prior to Visit  Medication Sig Dispense Refill  . amLODipine-benazepril (LOTREL) 5-20 MG per capsule Take 1 capsule by mouth daily. 90 capsule 3  . Cholecalciferol (VITAMIN D3) 2000 UNITS TABS Take by mouth daily.    Marland Kitchen estradiol (VIVELLE-DOT) 0.025 MG/24HR Place 1 patch onto the skin 2 (two) times a week.      . levothyroxine (SYNTHROID, LEVOTHROID) 100 MCG tablet Take 100 mcg by mouth daily before breakfast.    . omeprazole (PRILOSEC) 20 MG capsule Take 20 mg by mouth daily.    . rosuvastatin (CRESTOR) 5 MG tablet Take 5 mg by mouth. 2-3 times weekly    . triamterene-hydrochlorothiazide (DYAZIDE) 37.5-25 MG per capsule Take 1 each (1 capsule total) by mouth daily. 90 capsule 3  . Vitamin D, Ergocalciferol, (DRISDOL) 50000 UNITS CAPS capsule Take 50,000 Units by mouth every 7 (seven) days.     No current facility-administered  medications on file prior to visit.     Past Medical History:  Diagnosis Date  . Anemia    due to Dysmenorrhea  . Hypertension    initially after BCP initiated   . Thyroid disease 2005 & 2009   nodule    Past Surgical History:  Procedure Laterality Date  . ABDOMINAL HYSTERECTOMY  1998   for heavy menses and fibroids  . BIOPSY THYROID  2005,2009   both negative  . COLONOSCOPY  2009    Dr [facility]  . EXPLORATORY LAPAROTOMY  1985   Ovarian cyst  . [place]    LLQ pain due to ovarian cyst  . TONSILLECTOMY      Social History   Social History  . Marital status: Married    Spouse name: N/A  . Number of children: N/A  . Years of education: N/A   Social History Main Topics  . Smoking status: Never Smoker  . Smokeless tobacco: None  . Alcohol use No  . Drug use: No  . Sexual activity: Not Currently   Other Topics Concern  . None   Social History Narrative  . None    Family History  Problem Relation Age of Onset  . Hypertension Father   . Kidney failure Father     Bright's Disease; died @ 51  . Hypertension Mother   . Hyperlipidemia Mother   . Osteoarthritis Mother   . Hypertension Brother   .  Diabetes Brother   . Stroke Maternal Grandmother     in 38s  . Hypertension Maternal Aunt     also DJD/ OA  . Breast cancer Maternal Aunt   . Heart attack Maternal Grandfather 69    also  CVA  . Heart block Paternal Grandmother     pacemaker    Review of Systems  Constitutional: Negative for appetite change, chills, fatigue and fever.  Eyes: Negative for visual disturbance.  Respiratory: Negative for cough, shortness of breath and wheezing.   Cardiovascular: Positive for leg swelling (occasionally at beach). Negative for chest pain and palpitations.  Gastrointestinal: Negative for abdominal pain, blood in stool, constipation, diarrhea and nausea.       Gerd  Genitourinary: Negative for dysuria and hematuria.  Musculoskeletal:  Positive for arthralgias (left shoulder) and back pain (chronic - sees chiropractor regularly). Negative for myalgias.  Skin: Negative for color change and rash.  Neurological: Negative for dizziness, light-headedness and headaches.  Psychiatric/Behavioral: Negative for dysphoric mood. The patient is not nervous/anxious.        Objective:   Vitals:   07/08/16 1056  BP: 130/82  Pulse: 69  Resp: 16  Temp: 98.1 F (36.7 C)   Filed Weights   07/08/16 1056  Weight: 204 lb (92.5 kg)   Body mass index is 35.02 kg/m.   Physical Exam Constitutional: She appears well-developed and well-nourished. No distress.  HENT:  Head: Normocephalic and atraumatic.  Right Ear: External ear normal. Normal ear canal and TM Left Ear: External ear normal.  Normal ear canal and TM Mouth/Throat: Oropharynx is clear and moist.  Eyes: Conjunctivae and EOM are normal.  Neck: Neck supple. No tracheal deviation present. No thyromegaly present.  No carotid bruit  Cardiovascular: Normal rate, regular rhythm and normal heart sounds.   No murmur heard.  No edema. Pulmonary/Chest: Effort normal and breath sounds normal. No respiratory distress. She has no wheezes. She has no rales.  Breast: deferred to Gyn Abdominal: Soft. She exhibits no distension. There is no tenderness.  Lymphadenopathy: She has no cervical adenopathy.  Skin: Skin is warm and dry. She is not diaphoretic.  Psychiatric: She has a normal mood and affect. Her behavior is normal.         Assessment & Plan:   Physical exam: Screening blood work - deferred  - has had recent blood work  Immunizations - due for shingles - advised to get it in the next couple of years Colonoscopy - due in 2019 - will schedule for next year - [facility]  Up to date  Gyn  Up to date  Dexa  Up to date  Eye exams Up to date  EKG  Normal in 2013 Exercise - doing some exercise -- stressed increased exercise Weight - stressed working on weight  loss Skin - will see derm Substance abuse - none  See Problem List for Assessment and Plan of chronic medical problems.  Follow up annually

## 2016-07-10 ENCOUNTER — Encounter: Payer: Self-pay | Admitting: Physical Therapy

## 2016-07-10 ENCOUNTER — Ambulatory Visit: Payer: 59 | Admitting: Physical Therapy

## 2016-07-10 DIAGNOSIS — M6281 Muscle weakness (generalized): Secondary | ICD-10-CM | POA: Diagnosis not present

## 2016-07-10 DIAGNOSIS — M546 Pain in thoracic spine: Secondary | ICD-10-CM | POA: Diagnosis not present

## 2016-07-10 DIAGNOSIS — R293 Abnormal posture: Secondary | ICD-10-CM

## 2016-07-10 DIAGNOSIS — M25512 Pain in left shoulder: Secondary | ICD-10-CM | POA: Diagnosis not present

## 2016-07-10 NOTE — Therapy (Addendum)
[facility] [address], [place], [name], [address] Phone: [phone]   Fax:  [phone]  Physical Therapy Treatment  Patient Details  Name: Joan [name] MRN: [id number] Date of Birth: June 17, 1956 Referring Provider: Dr. Quay Burow  Encounter Date: 07/10/2016       PT End of Session - 07/10/16 0836    Visit Number 9   Date for PT Re-Evaluation 07/26/16   PT Start Time 0805   PT Stop Time 0843   PT Time Calculation (min) 38 min   Activity Tolerance Patient tolerated treatment well   Behavior During Therapy [facility] for tasks assessed/performed      Past Medical History:  Diagnosis Date  . Anemia    due to Dysmenorrhea  . Hypertension    initially after BCP initiated   . Thyroid disease 2005 & 2009   nodule    Past Surgical History:  Procedure Laterality Date  . ABDOMINAL HYSTERECTOMY  1998   for heavy menses and fibroids  . BIOPSY THYROID  2005,2009   both negative  . COLONOSCOPY  2009    Dr Annette [facility]  . EXPLORATORY LAPAROTOMY  1985   Ovarian cyst  . [place]    LLQ pain due to ovarian cyst  . TONSILLECTOMY      There were no vitals filed for this visit.      Subjective Assessment - 07/10/16 0808    Subjective Pt is trying to stop taking OTC pain meds. Has not taken alive in 5 days.    Pertinent History hx of hip and shoulder bursitis;  osteopenia   Limitations Standing;Sitting   Currently in Pain? Yes   Pain Score 2    Pain Location Back   Pain Orientation Mid   Pain Descriptors / Indicators Tightness   Pain Type Chronic pain   Pain Onset More than a month ago   Pain Frequency Constant   Aggravating Factors  Standing   Pain Relieving Factors Alleve   Multiple Pain Sites Yes   Pain Score 3   Pain Location Neck   Pain Orientation Left   Pain Descriptors / Indicators Tightness   Pain Type Chronic pain   Pain Onset More than a month ago   Aggravating Factors   Sleeping on left side                          [facility] Adult PT Treatment/Exercise - 07/10/16 0001      Neck Exercises: Machines for Strengthening   UBE (Upper Arm Bike) 6 min 3/3   Other Machines for Strengthening Nu Step L3 6 mins     Neck Exercises: Theraband   Rows 10 reps;Red   Horizontal ABduction 10 reps;Red     Shoulder Exercises: Standing   Row Strengthening;20 reps  cues for posture   Theraband Level (Shoulder Row) Level 2 (Red)   Other Standing Exercises wall push ups x 20   Other Standing Exercises roll ball up wall for shoulder stretching x 15     Shoulder Exercises: ROM/Strengthening   Other ROM/Strengthening Exercises seated thoracic extension over ball 15x     Neck Exercises: Stretches   Chest Stretch 60 seconds  supine on foam roller                  PT Short Term Goals - 07/10/16 0841      PT SHORT TERM GOAL #1   Title  The patient will demonstrate improved postural correction and safe body mechanics related to osteopenia   06/28/16   Time 4   Period Weeks   Status Achieved     PT SHORT TERM GOAL #2   Title The patient will have improved cervical rotation and thoracic rotation to 35 degrees needed for driving     Time 4   Period Weeks   Status Achieved     PT SHORT TERM GOAL #3   Title The patient will report a 25% improvement in pain with standing to cook   Time 4   Period Weeks   Status Achieved     PT SHORT TERM GOAL #4   Title Patient will have improved thoracic extension to 5 degrees needed to decrease vertebral body compression and improve UE ovehead use   Time 4   Period Weeks   Status Achieved           PT Long Term Goals - 07/10/16 [id number]      PT LONG TERM GOAL #1   Title The patient will be independent in safe self progression of HEP needed for further improvements in pain, strength, ROM and posture   07/26/16   Time 8   Period Weeks   Status On-going     PT LONG TERM GOAL #2   Title The patient will  report a 60% improvement in pain with standing to cook > 30 min   Time 8   Period Weeks   Status On-going     PT LONG TERM GOAL #3   Title The patient will have improved core and scapular muscle strength to grossly 4/5 needed to stand for > 40 min   Time 8   Period Weeks   Status On-going     PT LONG TERM GOAL #4   Title Cervical  rotation improved to 40 degrees needed for driving   Time 8   Period Weeks   Status On-going     PT LONG TERM GOAL #5   Title FOTO functional outcome score improved from 50% limitation to 40% indicating improved function with less pain   Time 8   Period Weeks   Status On-going               Plan - 07/10/16 0837    Clinical Impression Statement Pt has improved thoracic extension and good roatation. Posture is slightly rounded shoulders and slightly foward head. tightness in back and across shoulders improved with stretching. Continues to lack strength and scapular stability needed for posture and ROM.    Rehab Potential Good   Clinical Impairments Affecting Rehab Potential osteopenia    PT Frequency 2x / week   PT Duration 8 weeks   PT Treatment/Interventions ADLs/Self Care Home Management;Iontophoresis 4mg /ml Dexamethasone;Cryotherapy;Electrical Stimulation;Moist Heat;Ultrasound;Therapeutic exercise;Neuromuscular re-education;Patient/family education;Manual techniques;Dry needling;Taping   PT Next Visit Plan Continue to improve strength and stability in thoracic and left shoulder. Continue to increase posture and progress towards goals.    Consulted and Agree with Plan of Care Patient      Patient will benefit from skilled therapeutic intervention in order to improve the following deficits and impairments:  Decreased range of motion, Decreased strength, Pain, Postural dysfunction, Impaired UE functional use  Visit Diagnosis: Pain in thoracic spine  Muscle weakness (generalized)  Abnormal posture     Problem List Patient Active  Problem List   Diagnosis Date Noted  . Gallbladder polyp 05/20/2016  . Fatty liver 05/20/2016  . Elevated LFTs 05/02/2016  .  Obesity 05/01/2016  . Abdominal discomfort 05/01/2016  . Proteinuria 03/06/2015  . Unspecified vitamin D deficiency 07/14/2013  . LOW BACK PAIN SYNDROME 01/23/2010  . Status post radioactive iodine thyroid ablation 08/17/2009  . Hyperlipidemia 07/26/2008  . ANEMIA-NOS 07/26/2008  . Essential hypertension 07/26/2008  . Pain in joint of left shoulder 07/26/2008  . Goiter 07/14/2007    Mikle Bosworth PTA 07/10/2016, 9:14 AM  [facility] [address] [place], [name], [address] Phone: [phone]   Fax:  [phone]  Name: Joan [name] MRN: [id number] Date of Birth: 1956/09/11

## 2016-07-16 ENCOUNTER — Ambulatory Visit: Payer: 59 | Admitting: Physical Therapy

## 2016-07-16 DIAGNOSIS — M546 Pain in thoracic spine: Secondary | ICD-10-CM

## 2016-07-16 DIAGNOSIS — M25512 Pain in left shoulder: Secondary | ICD-10-CM | POA: Diagnosis not present

## 2016-07-16 DIAGNOSIS — M6281 Muscle weakness (generalized): Secondary | ICD-10-CM

## 2016-07-16 DIAGNOSIS — R293 Abnormal posture: Secondary | ICD-10-CM | POA: Diagnosis not present

## 2016-07-16 NOTE — Therapy (Signed)
[facility] [address] [place], [name], [address] Phone: [phone]   Fax:  [phone]  Physical Therapy Treatment  Patient Details  Name: Joan [name] MRN: [id number] Date of Birth: March 19, 1956 Referring Provider: Dr. Quay Burow  Encounter Date: 07/16/2016      PT End of Session - 07/16/16 1616    Visit Number 10   Date for PT Re-Evaluation 07/26/16   PT Start Time [id number]   PT Stop Time 1620   PT Time Calculation (min) 45 min   Activity Tolerance Patient tolerated treatment well      Past Medical History:  Diagnosis Date  . Anemia    due to Dysmenorrhea  . Hypertension    initially after BCP initiated   . Thyroid disease 2005 & 2009   nodule    Past Surgical History:  Procedure Laterality Date  . ABDOMINAL HYSTERECTOMY  1998   for heavy menses and fibroids  . BIOPSY THYROID  2005,2009   both negative  . COLONOSCOPY  2009    Dr Annette [facility]  . EXPLORATORY LAPAROTOMY  1985   Ovarian cyst  . [place]    LLQ pain due to ovarian cyst  . TONSILLECTOMY      There were no vitals filed for this visit.      Subjective Assessment - 07/16/16 1537    Subjective I think I do better coming in the morning vs. the afternoon.  I'm tired.     Currently in Pain? Yes   Pain Score 3    Pain Location Back   Pain Orientation Mid   Pain Score 3   Pain Location Shoulder   Pain Orientation Left            [facility] PT Assessment - 07/16/16 0001      AROM   Right Shoulder Flexion 150 Degrees   Right Shoulder ABduction 147 Degrees   Right Shoulder Internal Rotation --  L1   Right Shoulder External Rotation 73 Degrees   Left Shoulder Flexion 150 Degrees   Left Shoulder ABduction 108 Degrees   Left Shoulder Internal Rotation --  left iliac crest   Left Shoulder External Rotation 78 Degrees                     [facility] Adult PT Treatment/Exercise - 07/16/16 0001       Shoulder Exercises: Prone   Other Prone Exercises leaning on mat table:  1# extension, horizontal abduction, scaption 10x 2 each     Shoulder Exercises: Standing   Flexion --  red ball roll on tall table scaption 20x   Other Standing Exercises wall push ups x 20   Other Standing Exercises yellow band wall scaption 15x     Shoulder Exercises: ROM/Strengthening   UBE (Upper Arm Bike) L1 x 5 mins (2/2 1/20_   Other ROM/Strengthening Exercises seated thoracic extension over ball 15x   Other ROM/Strengthening Exercises Nu-Step L1 8 min        Patient declines the need for modalities.             PT Short Term Goals - 07/16/16 1625      PT SHORT TERM GOAL #1   Title The patient will demonstrate improved postural correction and safe body mechanics related to osteopenia   06/28/16   Status Achieved     PT SHORT TERM GOAL #2   Title The patient will have improved cervical rotation  and thoracic rotation to 35 degrees needed for driving     Status Achieved     PT SHORT TERM GOAL #3   Title The patient will report a 25% improvement in pain with standing to cook   Status Achieved     PT SHORT TERM GOAL #4   Title Patient will have improved thoracic extension to 5 degrees needed to decrease vertebral body compression and improve UE ovehead use   Status Achieved           PT Long Term Goals - 07/16/16 1625      PT LONG TERM GOAL #1   Title The patient will be independent in safe self progression of HEP needed for further improvements in pain, strength, ROM and posture   07/26/16   Time 8   Period Weeks   Status On-going     PT LONG TERM GOAL #2   Title The patient will report a 60% improvement in pain with standing to cook > 30 min   Time 8   Period Weeks   Status On-going     PT LONG TERM GOAL #3   Title The patient will have improved core and scapular muscle strength to grossly 4/5 needed to stand for > 40 min   Time 8   Period Weeks   Status On-going     PT LONG  TERM GOAL #4   Title Cervical  rotation improved to 40 degrees needed for driving   Time 8   Period Weeks   Status On-going     PT LONG TERM GOAL #5   Title FOTO functional outcome score improved from 50% limitation to 40% indicating improved function with less pain   Time 8   Period Weeks   Status On-going               Plan - 07/16/16 1618    Clinical Impression Statement Patient has new order from MD for left shoulder pain.  Patient reports no Alleve in 1 week.  Excellent improvements in shoulder AROM since July.  Shoulder abduction still limited.  Negative active compression test, negative lag sign, negative Michel Bickers.  Patient improving with pain reduction in mid back as well.  She may be off work next week, with therefore reassess progress toward goals next visit for renewal vs. discharge.  Therapist closely monitoring response with all exercise.  Min verbal cues to decrease compensatory shoulder hike.     PT Next Visit Plan ERO vs. discharge;  check cervical AROM;  FOTO, overall % improvement;  left shoulder ROM/strength      Patient will benefit from skilled therapeutic intervention in order to improve the following deficits and impairments:     Visit Diagnosis: Pain in thoracic spine  Muscle weakness (generalized)  Abnormal posture  Pain in left shoulder     Problem List Patient Active Problem List   Diagnosis Date Noted  . Gallbladder polyp 05/20/2016  . Fatty liver 05/20/2016  . Elevated LFTs 05/02/2016  . Obesity 05/01/2016  . Abdominal discomfort 05/01/2016  . Proteinuria 03/06/2015  . Unspecified vitamin D deficiency 07/14/2013  . LOW BACK PAIN SYNDROME 01/23/2010  . Status post radioactive iodine thyroid ablation 08/17/2009  . Hyperlipidemia 07/26/2008  . ANEMIA-NOS 07/26/2008  . Essential hypertension 07/26/2008  . Pain in joint of left shoulder 07/26/2008  . Goiter 07/14/2007   [name], PT 07/16/16 4:27 PM Phone:  [phone] Fax: [phone]  [name] 07/16/2016, 4:27 PM  [facility] [address], [place], [name], [address] Phone: [phone]   Fax:  [phone]  Name: Joan [name] MRN: [id number] Date of Birth: April 27, 1956

## 2016-07-18 ENCOUNTER — Ambulatory Visit: Payer: 59 | Admitting: Physical Therapy

## 2016-07-18 DIAGNOSIS — M546 Pain in thoracic spine: Secondary | ICD-10-CM | POA: Diagnosis not present

## 2016-07-18 DIAGNOSIS — R293 Abnormal posture: Secondary | ICD-10-CM

## 2016-07-18 DIAGNOSIS — M6281 Muscle weakness (generalized): Secondary | ICD-10-CM | POA: Diagnosis not present

## 2016-07-18 DIAGNOSIS — M25512 Pain in left shoulder: Secondary | ICD-10-CM | POA: Diagnosis not present

## 2016-07-18 NOTE — Therapy (Signed)
[facility] [address] [place], [name], [address] Phone: [phone]   Fax:  [phone]  Physical Therapy Treatment  Patient Details  Name: Joan [name] MRN: [id number] Date of Birth: June 10, 1956 Referring Provider: Dr. Quay Burow  Encounter Date: 07/18/2016      PT End of Session - 07/18/16 0828    Visit Number 11   Date for PT Re-Evaluation 07/26/16   PT Start Time 0803   PT Stop Time 0841   PT Time Calculation (min) 38 min   Activity Tolerance Patient tolerated treatment well      Past Medical History:  Diagnosis Date  . Anemia    due to Dysmenorrhea  . Hypertension    initially after BCP initiated   . Thyroid disease 2005 & 2009   nodule    Past Surgical History:  Procedure Laterality Date  . ABDOMINAL HYSTERECTOMY  1998   for heavy menses and fibroids  . BIOPSY THYROID  2005,2009   both negative  . COLONOSCOPY  2009    Dr Annette [facility]  . EXPLORATORY LAPAROTOMY  1985   Ovarian cyst  . [place]    LLQ pain due to ovarian cyst  . TONSILLECTOMY      There were no vitals filed for this visit.      Subjective Assessment - 07/18/16 0805    Subjective Minor discomfort this AM.   Mild post ex soreness after last session but nothing too bad.     Currently in Pain? Yes   Pain Score 2    Pain Location Back   Pain Orientation Mid   Pain Type Chronic pain   Pain Score 1   Pain Location Shoulder   Pain Orientation Left   Pain Type Chronic pain   Pain Onset More than a month ago   Pain Frequency Intermittent                         [facility] Adult PT Treatment/Exercise - 07/18/16 0001      Neck Exercises: Machines for Strengthening   UBE (Upper Arm Bike) 6 min 3/3   Other Machines for Strengthening Nu Step L3 6 mins     Shoulder Exercises: Supine   Protraction Strengthening;Right;Left;10 reps;Weights   Protraction Weight (lbs) 2   Other Supine  Exercises 2# small arc 12/6 and 3/9     Shoulder Exercises: Seated   Extension Strengthening;Left;15 reps;Weights   Extension Weight (lbs) 2   Row Strengthening;Left;15 reps   Row Weight (lbs) 2     Shoulder Exercises: Sidelying   External Rotation Strengthening;Left;10 reps;Weights   External Rotation Weight (lbs) 2   ABduction AROM;Left;10 reps   Theraband Level (Shoulder ABduction) --  small arc 12/6 and 9/3     Shoulder Exercises: Standing   Internal Rotation Strengthening;Left;15 reps;Theraband   Flexion Strengthening;Left;15 reps;Theraband   Theraband Level (Shoulder Flexion) Level 2 (Red)   Row Strengthening;20 reps  cues for posture   Theraband Level (Shoulder Row) Level 2 (Red)   Other Standing Exercises wall push ups x 20   Other Standing Exercises roll ball up wall for shoulder stretching x 15                PT Education - 07/18/16 0828    Education provided Yes   Education Details shoulder [place]   Person(s) Educated Patient   Methods Explanation;Demonstration;Handout   Comprehension Verbalized understanding;Returned demonstration  PT Short Term Goals - 07/18/16 0843      PT SHORT TERM GOAL #1   Title The patient will demonstrate improved postural correction and safe body mechanics related to osteopenia   06/28/16   Status Achieved     PT SHORT TERM GOAL #2   Title The patient will have improved cervical rotation and thoracic rotation to 35 degrees needed for driving     Status Achieved     PT SHORT TERM GOAL #3   Title The patient will report a 25% improvement in pain with standing to cook   Status Achieved     PT SHORT TERM GOAL #4   Title Patient will have improved thoracic extension to 5 degrees needed to decrease vertebral body compression and improve UE ovehead use   Status Achieved           PT Long Term Goals - 07/18/16 0846      PT LONG TERM GOAL #1   Title The patient will be independent in safe self progression of  HEP needed for further improvements in pain, strength, ROM and posture   07/26/16   Time 8   Period Weeks   Status On-going     PT LONG TERM GOAL #2   Title The patient will report a 60% improvement in pain with standing to cook > 30 min   Time 8   Period Weeks   Status On-going     PT LONG TERM GOAL #3   Title The patient will have improved core and scapular muscle strength to grossly 4/5 needed to stand for > 40 min   Time 8   Period Weeks   Status On-going     PT LONG TERM GOAL #4   Title Cervical  rotation improved to 40 degrees needed for driving   Time 8   Period Weeks   Status On-going     PT LONG TERM GOAL #5   Title FOTO functional outcome score improved from 50% limitation to 40% indicating improved function with less pain   Time 8   Period Weeks   Status On-going               Plan - 07/18/16 [id number]    Clinical Impression Statement My back is 60% better overall.  She is improving with postural strength as well as improvements in shoulder ROM (as per measurements last visit).  She is able to progress with glenohumeral / scapular stabilization exercises without pain exacerbation.  Therapist closely monitoring response with all.     PT Next Visit Plan Patient on vacation next week, upon return do ERO to continue back and shoulder rehab      Patient will benefit from skilled therapeutic intervention in order to improve the following deficits and impairments:     Visit Diagnosis: Pain in thoracic spine  Muscle weakness (generalized)  Abnormal posture  Pain in left shoulder     Problem List Patient Active Problem List   Diagnosis Date Noted  . Gallbladder polyp 05/20/2016  . Fatty liver 05/20/2016  . Elevated LFTs 05/02/2016  . Obesity 05/01/2016  . Abdominal discomfort 05/01/2016  . Proteinuria 03/06/2015  . Unspecified vitamin D deficiency 07/14/2013  . LOW BACK PAIN SYNDROME 01/23/2010  . Status post radioactive iodine thyroid ablation  08/17/2009  . Hyperlipidemia 07/26/2008  . ANEMIA-NOS 07/26/2008  . Essential hypertension 07/26/2008  . Pain in joint of left shoulder 07/26/2008  . Goiter 07/14/2007   [name], PT  07/18/16 8:50 AM Phone: [phone] Fax: [phone]  [name] 07/18/2016, 8:50 AM  [facility] [address], [place], [name], [address] Phone: [phone]   Fax:  [phone]  Name: Harlowe Mikita MRN: [id number] Date of Birth: 05-08-1956

## 2016-07-18 NOTE — Patient Instructions (Signed)
Strengthening: Resisted Internal Rotation   Hold tubing in left hand, elbow at side and forearm out. Rotate forearm in across body. Repeat _10___ times per set. Do __1__ sets per session. Do __1__ sessions per day.  http://orth.exer.us/830   Copyright  VHI. All rights reserved.  Strengthening: Resisted External Rotation   Hold tubing in right hand, elbow at side and forearm across body. Rotate forearm out. Repeat __10__ times per set. Do __1__ sets per session. Do _1___ sessions per day.  http://orth.exer.us/828   Copyright  VHI. All rights reserved.  Strengthening: Resisted Flexion   Hold tubing with left arm at side. Pull forward and up. Move shoulder through pain-free range of motion. Repeat __10__ times per set. Do ___1_ sets per session. Do __1__ sessions per day.  http://orth.exer.us/824   Copyright  VHI. All rights reserved.  Strengthening: Resisted Extension   Hold tubing in right hand, arm forward. Pull arm back, elbow straight. Repeat ___10_ times per set. Do _1___ sets per session. Do _1___ sessions per day.  http://orth.exer.us/832   Copyright  VHI. All rights reserved.    [name] PT [facility] [address], [place], [place] [address] Phone # [phone] Fax [phone]

## 2016-07-25 ENCOUNTER — Encounter: Payer: 59 | Admitting: Physical Therapy

## 2016-07-30 ENCOUNTER — Other Ambulatory Visit: Payer: Self-pay | Admitting: Internal Medicine

## 2016-07-31 DIAGNOSIS — L308 Other specified dermatitis: Secondary | ICD-10-CM | POA: Diagnosis not present

## 2016-07-31 DIAGNOSIS — E7800 Pure hypercholesterolemia, unspecified: Secondary | ICD-10-CM | POA: Diagnosis not present

## 2016-07-31 DIAGNOSIS — M7130 Other bursal cyst, unspecified site: Secondary | ICD-10-CM | POA: Diagnosis not present

## 2016-07-31 DIAGNOSIS — Z1283 Encounter for screening for malignant neoplasm of skin: Secondary | ICD-10-CM | POA: Diagnosis not present

## 2016-07-31 DIAGNOSIS — R7301 Impaired fasting glucose: Secondary | ICD-10-CM | POA: Diagnosis not present

## 2016-08-01 ENCOUNTER — Ambulatory Visit: Payer: 59 | Attending: Internal Medicine | Admitting: Physical Therapy

## 2016-08-01 DIAGNOSIS — M546 Pain in thoracic spine: Secondary | ICD-10-CM | POA: Diagnosis not present

## 2016-08-01 DIAGNOSIS — M6281 Muscle weakness (generalized): Secondary | ICD-10-CM | POA: Diagnosis not present

## 2016-08-01 DIAGNOSIS — M25512 Pain in left shoulder: Secondary | ICD-10-CM

## 2016-08-01 DIAGNOSIS — R293 Abnormal posture: Secondary | ICD-10-CM | POA: Diagnosis not present

## 2016-08-01 NOTE — Therapy (Signed)
[facility] [address], [place], [name], [address] Phone: [phone]   Fax:  [phone]  Physical Therapy Treatment/Recertification  Patient Details  Name: Joan [name] MRN: [id number] Date of Birth: April 28, 1956 Referring Provider: Dr. Quay Burow  Encounter Date: 08/01/2016      PT End of Session - 08/01/16 0905    Visit Number 12   Date for PT Re-Evaluation 09/26/16   PT Start Time 0732   PT Stop Time 0804   PT Time Calculation (min) 32 min   Activity Tolerance Patient tolerated treatment well      Past Medical History:  Diagnosis Date  . Anemia    due to Dysmenorrhea  . Hypertension    initially after BCP initiated   . Thyroid disease 2005 & 2009   nodule    Past Surgical History:  Procedure Laterality Date  . ABDOMINAL HYSTERECTOMY  1998   for heavy menses and fibroids  . BIOPSY THYROID  2005,2009   both negative  . COLONOSCOPY  2009    Dr Annette [facility]  . EXPLORATORY LAPAROTOMY  1985   Ovarian cyst  . [place]    LLQ pain due to ovarian cyst  . TONSILLECTOMY      There were no vitals filed for this visit.      Subjective Assessment - 08/01/16 0732    Subjective Took a few days off from work.  Feeling OK.  My back has bothered me more since it's been rainy.  Prolonged sitting bothered yesterday.  My shoulder is not as bad until I lie down and I roll over on it.  Putting on my seat belt is better.  I get fatigued when I stand and have to lean against the wall sometimes.   Pertinent History hx of hip and shoulder bursitis;  osteopenia   Currently in Pain? Yes   Pain Score 4    Pain Location Back   Pain Type Chronic pain   Pain Score 0   Pain Location Neck            [facility] PT Assessment - 08/01/16 0001      Observation/Other Assessments   Focus on Therapeutic Outcomes (FOTO)  46% limitation      AROM   Left Shoulder Flexion 147 Degrees   Left Shoulder  ABduction 100 Degrees   Left Shoulder Internal Rotation --  left iliac crest   Left Shoulder External Rotation 80 Degrees   Cervical Flexion 55   Cervical Extension 50   Cervical - Right Side Bend 25   Cervical - Left Side Bend 33   Cervical - Right Rotation 30   Cervical - Left Rotation 40   Thoracic - Right Rotation 40   Thoracic - Left Rotation 50     Strength   Cervical Flexion 4-/5   Cervical Extension 4/5   Lumbar Flexion 4-/5   Lumbar Extension 4-/5   Thoracic Flexion 4-/5   Thoracic Extension 4-/5                     [facility] Adult PT Treatment/Exercise - 08/01/16 0001      Lumbar Exercises: Standing   Wall Slides 5 reps   Wall Slides Limitations discontinued secondary to knee pain     Lumbar Exercises: Supine   Ab Set 10 reps   Bent Knee Raise 10 reps   Isometric Hip Flexion 10 reps     Shoulder Exercises: Seated  Other Seated Exercises 1# shoulder 3 ways:  flex, scaption,abd 10x each     Shoulder Exercises: Therapy Ball   Flexion 10 reps   Flexion Limitations red ball roll up wall                PT Education - 08/01/16 0753    Education provided Yes   Education Details ab brace series 1,2,3   Person(s) Educated Patient   Methods Explanation;Demonstration;Handout   Comprehension Verbalized understanding;Returned demonstration          PT Short Term Goals - 08/01/16 0919      PT SHORT TERM GOAL #1   Title The patient will demonstrate improved postural correction and safe body mechanics related to osteopenia   06/28/16   Status Achieved     PT SHORT TERM GOAL #2   Title The patient will have improved cervical rotation and thoracic rotation to 35 degrees needed for driving     Status Achieved     PT SHORT TERM GOAL #3   Title The patient will report a 25% improvement in pain with standing to cook   Status Achieved     PT SHORT TERM GOAL #4   Title Patient will have improved thoracic extension to 5 degrees needed to decrease  vertebral body compression and improve UE ovehead use   Status Achieved           PT Long Term Goals - 08/01/16 1129      PT LONG TERM GOAL #1   Title The patient will be independent in safe self progression of HEP needed for further improvements in pain, strength, ROM and posture   09/26/16   Time 8   Period Weeks   Status On-going     PT LONG TERM GOAL #2   Title The patient will report a 60% improvement in pain with standing to cook > 30 min   Time 8   Period Weeks   Status On-going     PT LONG TERM GOAL #3   Title The patient will have improved core and scapular muscle strength to grossly 4/5 needed to stand for > 40 min   Status Partially Met     PT LONG TERM GOAL #4   Title Cervical  rotation improved to 40 degrees needed for driving   Time 8   Period Weeks   Status On-going     PT LONG TERM GOAL #5   Title FOTO functional outcome score improved from 50% limitation to 40% indicating improved function with less pain   Time 8   Period Weeks   Status On-going               Plan - 08/01/16 0906    Clinical Impression Statement The patient reports she is 60% better overall but continues to have multi-regional pain she associates with her long history of arthritis.  Today her low back is bothering her the most.  Her shoulder pain has greatly improved and now primarily hurts when she rolls over on it.  Her ROM is limited in abduction and IR.  Improving core and scapular stability.  FOTO functional outcome score has improved from 50% to 46%.  Partial progress with LTGs.  She would benefit from continued PT for postural strengthening, pain management and ROM of cervical and shoulder.     Rehab Potential Good   Clinical Impairments Affecting Rehab Potential osteopenia    PT Frequency 2x / week   PT Duration 8  weeks   PT Treatment/Interventions ADLs/Self Care Home Management;Iontophoresis 61m/ml Dexamethasone;Cryotherapy;Electrical Stimulation;Moist  Heat;Ultrasound;Therapeutic exercise;Neuromuscular re-education;Patient/family education;Manual techniques;Dry needling;Taping   PT Next Visit Plan shoulder and cervical AROM;   abdominal and deep cervical flexor strengthening;  standing core ex's;   scapular strengthening;  pain management as needed      Patient will benefit from skilled therapeutic intervention in order to improve the following deficits and impairments:  Decreased range of motion, Decreased strength, Pain, Postural dysfunction, Impaired UE functional use  Visit Diagnosis: Pain in thoracic spine - Plan: PT plan of care cert/re-cert  Muscle weakness (generalized) - Plan: PT plan of care cert/re-cert  Abnormal posture - Plan: PT plan of care cert/re-cert  Pain in left shoulder - Plan: PT plan of care cert/re-cert     Problem List Patient Active Problem List   Diagnosis Date Noted  . Gallbladder polyp 05/20/2016  . Fatty liver 05/20/2016  . Elevated LFTs 05/02/2016  . Obesity 05/01/2016  . Abdominal discomfort 05/01/2016  . Proteinuria 03/06/2015  . Unspecified vitamin D deficiency 07/14/2013  . LOW BACK PAIN SYNDROME 01/23/2010  . Status post radioactive iodine thyroid ablation 08/17/2009  . Hyperlipidemia 07/26/2008  . ANEMIA-NOS 07/26/2008  . Essential hypertension 07/26/2008  . Pain in joint of left shoulder 07/26/2008  . Goiter 07/14/2007   SRuben Im PT 08/01/16 11:35 AM Phone: [phone]: [phone] SAlvera Singh9/05/2016, 11:34 AM  [facility] [address] [place] [address]Phone: [phone]  Fax:  [phone] Name: MJuliene KirshMRN: [id number] of Birth: 213-Dec-1957

## 2016-08-01 NOTE — Patient Instructions (Signed)
Joan [name] PT [facility] [address], [address] [place], [place] [address] Phone # [phone] Fax [phone]    

## 2016-08-07 ENCOUNTER — Ambulatory Visit: Payer: 59 | Admitting: Physical Therapy

## 2016-08-07 ENCOUNTER — Encounter: Payer: Self-pay | Admitting: Physical Therapy

## 2016-08-07 DIAGNOSIS — R293 Abnormal posture: Secondary | ICD-10-CM

## 2016-08-07 DIAGNOSIS — M546 Pain in thoracic spine: Secondary | ICD-10-CM

## 2016-08-07 DIAGNOSIS — M6281 Muscle weakness (generalized): Secondary | ICD-10-CM | POA: Diagnosis not present

## 2016-08-07 DIAGNOSIS — M25512 Pain in left shoulder: Secondary | ICD-10-CM | POA: Diagnosis not present

## 2016-08-07 NOTE — Therapy (Signed)
[facility] [address], [place], [name], [address] Phone: [phone]   Fax:  [phone]  Physical Therapy Treatment  Patient Details  Name: Joan [name] MRN: [id number] Date of Birth: [birth year] Referring Provider: Dr. Quay Burow  Encounter Date: 08/07/2016      PT End of Session - 08/07/16 1400    Visit Number 13   Date for PT Re-Evaluation 09/26/16   PT Start Time 0848   PT Stop Time 0930   PT Time Calculation (min) 42 min   Activity Tolerance Patient tolerated treatment well   Behavior During Therapy [facility] for tasks assessed/performed      Past Medical History:  Diagnosis Date  . Anemia    due to Dysmenorrhea  . Hypertension    initially after BCP initiated   . Thyroid disease 2005 & 2009   nodule    Past Surgical History:  Procedure Laterality Date  . ABDOMINAL HYSTERECTOMY  1998   for heavy menses and fibroids  . BIOPSY THYROID  2005,2009   both negative  . COLONOSCOPY  2009    Dr Annette Stable [facility]  . EXPLORATORY LAPAROTOMY  1985   Ovarian cyst  . [place]    LLQ pain due to ovarian cyst  . TONSILLECTOMY      There were no vitals filed for this visit.      Subjective Assessment - 08/07/16 0852    Subjective Pt reports feeling aching especially at night. Pt thinks it may be weather related or diet related.    Pertinent History hx of hip and shoulder bursitis;  osteopenia   Limitations Standing;Sitting   How long can you sit comfortably? 1 hour   How long can you stand comfortably? 30 min   Diagnostic tests U/S right side showed fatty liver;  bone density shows osteopenia   Patient Stated Goals I want to lose 40#   Currently in Pain? Yes   Pain Score 4    Pain Location Back   Pain Orientation Mid   Pain Descriptors / Indicators Tightness;Aching   Pain Type Chronic pain   Pain Onset More than a month ago   Pain Frequency Constant   Multiple Pain Sites No                          [facility] Adult PT Treatment/Exercise - 08/07/16 0001      Neck Exercises: Machines for Strengthening   Other Machines for Strengthening Nu Step L3 6 mins     Neck Exercises: Supine   Other Supine Exercise --     Lumbar Exercises: Standing   Other Standing Lumbar Exercises Hip extension, hamstring curls  10x2   Other Standing Lumbar Exercises --     Lumbar Exercises: Supine   Bent Knee Raise 20 reps   Other Supine Lumbar Exercises Lumbar stretch on foam roller     Shoulder Exercises: Supine   Flexion AAROM;10 reps  towel between shoulders cane AAROM   Other Supine Exercises Supine on foam roller  Shoulder abduction, ER, extension     Shoulder Exercises: Standing   Other Standing Exercises --                  PT Short Term Goals - 08/07/16 0855      PT SHORT TERM GOAL #1   Title The patient will demonstrate improved postural correction and safe body mechanics related to osteopenia   06/28/16  Time 4   Period Weeks   Status Achieved     PT SHORT TERM GOAL #2   Title The patient will have improved cervical rotation and thoracic rotation to 35 degrees needed for driving     Time 4   Period Weeks   Status Achieved     PT SHORT TERM GOAL #3   Title The patient will report a 25% improvement in pain with standing to cook   Time 4   Period Weeks   Status Achieved     PT SHORT TERM GOAL #4   Title Patient will have improved thoracic extension to 5 degrees needed to decrease vertebral body compression and improve UE ovehead use   Time 4   Period Weeks   Status Achieved           PT Long Term Goals - 08/07/16 5146      PT LONG TERM GOAL #1   Title The patient will be independent in safe self progression of HEP needed for further improvements in pain, strength, ROM and posture   09/26/16   Time 8   Period Weeks   Status On-going     PT LONG TERM GOAL #2   Title The patient will report a 60% improvement in pain with  standing to cook > 30 min   Time 8   Period Weeks   Status On-going     PT LONG TERM GOAL #3   Title The patient will have improved core and scapular muscle strength to grossly 4/5 needed to stand for > 40 min   Time 8   Period Weeks   Status Partially Met     PT LONG TERM GOAL #4   Title Cervical  rotation improved to 40 degrees needed for driving   Time 8   Period Weeks   Status On-going     PT LONG TERM GOAL #5   Title FOTO functional outcome score improved from 50% limitation to 40% indicating improved function with less pain   Time 8   Period Weeks   Status On-going             Patient will benefit from skilled therapeutic intervention in order to improve the following deficits and impairments:     Visit Diagnosis: Pain in thoracic spine  Muscle weakness (generalized)  Abnormal posture  Pain in left shoulder     Problem List Patient Active Problem List   Diagnosis Date Noted  . Gallbladder polyp 05/20/2016  . Fatty liver 05/20/2016  . Elevated LFTs 05/02/2016  . Obesity 05/01/2016  . Abdominal discomfort 05/01/2016  . Proteinuria 03/06/2015  . Unspecified vitamin D deficiency 07/14/2013  . LOW BACK PAIN SYNDROME 01/23/2010  . Status post radioactive iodine thyroid ablation 08/17/2009  . Hyperlipidemia 07/26/2008  . ANEMIA-NOS 07/26/2008  . Essential hypertension 07/26/2008  . Pain in joint of left shoulder 07/26/2008  . Goiter 07/14/2007    Mikle Bosworth PTA 08/07/2016, 2:08 PM  [facility] [address] [place], [name], [address] Phone: [phone]   Fax:  [phone]  Name: Joan [name] MRN: [id number] Date of Birth: 06-Jan-1956

## 2016-08-13 ENCOUNTER — Ambulatory Visit: Payer: 59 | Admitting: Physical Therapy

## 2016-08-16 ENCOUNTER — Ambulatory Visit: Payer: 59 | Admitting: Physical Therapy

## 2016-08-16 DIAGNOSIS — M6281 Muscle weakness (generalized): Secondary | ICD-10-CM

## 2016-08-16 DIAGNOSIS — M25512 Pain in left shoulder: Secondary | ICD-10-CM

## 2016-08-16 DIAGNOSIS — R293 Abnormal posture: Secondary | ICD-10-CM | POA: Diagnosis not present

## 2016-08-16 DIAGNOSIS — M546 Pain in thoracic spine: Secondary | ICD-10-CM

## 2016-08-16 NOTE — Patient Instructions (Signed)
Aeliana Spates PT [facility] [address], [address] [place], [place] [address] Phone # [phone] Fax [phone]    

## 2016-08-16 NOTE — Therapy (Signed)
[name] [address] [name], [name], [address] Phone: [phone]   Fax:  [phone]  Physical Therapy Treatment  Patient Details  Name: Joan [name] MRN: [id number] Date of Birth: 06-03-1956 Referring Provider: Dr. Quay [name]  Encounter Date: 08/16/2016      PT End of Session - 08/16/16 0826    Visit Number 14   Date for PT Re-Evaluation 09/26/16   PT Start Time 0803   PT Stop Time 0843   PT Time Calculation (min) 40 min   Activity Tolerance Patient tolerated treatment well      Past Medical History:  Diagnosis Date  . Anemia    due to Dysmenorrhea  . Hypertension    initially after BCP initiated   . Thyroid disease 2005 & 2009   nodule    Past Surgical History:  Procedure Laterality Date  . ABDOMINAL HYSTERECTOMY  1998   for heavy menses and fibroids  . BIOPSY THYROID  2005,2009   both negative  . COLONOSCOPY  2009    Dr Joan [name] GI  . EXPLORATORY LAPAROTOMY  1985   Ovarian cyst  . [place]    LLQ pain due to ovarian cyst  . TONSILLECTOMY      There were no vitals filed for this visit.      Subjective Assessment - 08/16/16 0759    Subjective I felt better yesterday but my spine has been achy.  Sleeping on shoulder is painful.  I had a laser treatment on my left neck and shoulder.     Currently in Pain? Yes   Pain Score 3    Pain Location Shoulder   Pain Orientation Left   Pain Type Chronic pain   Pain Frequency Intermittent                         [facility] Adult PT Treatment/Exercise - 08/16/16 0001      Neck Exercises: Supine   Neck Retraction 10 reps   Capital Flexion 10 reps     Neck Exercises: Prone   Other Prone Exercise head lift with UE extension and horizontal abduction 8x each   Other Prone Exercise multifidi press with HS curls and hip extension 5x each l     Lumbar Exercises: Supine   Ab Set 10 reps   Isometric Hip Flexion  10 reps     Knee/Hip Exercises: Stretches   Active Hamstring Stretch Right;Left;3 reps;20 seconds     Knee/Hip Exercises: Standing   Hip Abduction Stengthening;Right;Left;10 reps   Abduction Limitations red band   Hip Extension Stengthening;Right;Left;10 reps   Extension Limitations red band   Other Standing Knee Exercises sit to stand from high table 10x no arms     Shoulder Exercises: ROM/Strengthening   Other ROM/Strengthening Exercises Nu-Step L3 7 min                PT Education - 08/16/16 0825    Education provided Yes   Education Details prone multifidi series   Person(s) Educated Patient   Methods Explanation;Demonstration;Handout   Comprehension Verbalized understanding;Returned demonstration          PT Short Term Goals - 08/16/16 0910      PT SHORT TERM GOAL #1   Title The patient will demonstrate improved postural correction and safe body mechanics related to osteopenia   06/28/16   Status Achieved     PT SHORT TERM GOAL #2  Title The patient will have improved cervical rotation and thoracic rotation to 35 degrees needed for driving     Status Achieved     PT SHORT TERM GOAL #3   Title The patient will report a 25% improvement in pain with standing to cook   Status Achieved     PT SHORT TERM GOAL #4   Title Patient will have improved thoracic extension to 5 degrees needed to decrease vertebral body compression and improve UE ovehead use   Status Achieved           PT Long Term Goals - 08/16/16 0910      PT LONG TERM GOAL #1   Title The patient will be independent in safe self progression of HEP needed for further improvements in pain, strength, ROM and posture   09/26/16   Time 8   Period Weeks   Status On-going     PT LONG TERM GOAL #2   Title The patient will report a 60% improvement in pain with standing to cook > 30 min   Time 8   Period Weeks   Status On-going     PT LONG TERM GOAL #3   Title The patient will have improved core  and scapular muscle strength to grossly 4/5 needed to stand for > 40 min   Time 8   Period Weeks   Status Partially Met     PT LONG TERM GOAL #4   Title Cervical  rotation improved to 40 degrees needed for driving   Time 8   Period Weeks   Status On-going     PT LONG TERM GOAL #5   Title FOTO functional outcome score improved from 50% limitation to 40% indicating improved function with less pain   Time 8   Period Weeks   Status On-going               Plan - 08/16/16 0841    Clinical Impression Statement Patient reports multi region discomfort which she attributes to arthritis.  She is able to participate in moderate level intensity ex for lower abdominal, lumbar multifidi, deep cervical flexors, scapular stabilizers and gluteals.  No increase in pain following treatment session.   Should  meet remaining goals in about 4-5 visits.   Therapist closely monitoring response with all.     PT Next Visit Plan add red band hip abduction and extension standing to HEP;  sit to stand;  core strengthening;  cervical flexor strengthening      Patient will benefit from skilled therapeutic intervention in order to improve the following deficits and impairments:     Visit Diagnosis: Pain in thoracic spine  Muscle weakness (generalized)  Abnormal posture  Pain in left shoulder     Problem List Patient Active Problem List   Diagnosis Date Noted  . Gallbladder polyp 05/20/2016  . Fatty liver 05/20/2016  . Elevated LFTs 05/02/2016  . Obesity 05/01/2016  . Abdominal discomfort 05/01/2016  . Proteinuria 03/06/2015  . Unspecified vitamin D deficiency 07/14/2013  . LOW BACK PAIN SYNDROME 01/23/2010  . Status post radioactive iodine thyroid ablation 08/17/2009  . Hyperlipidemia 07/26/2008  . ANEMIA-NOS 07/26/2008  . Essential hypertension 07/26/2008  . Pain in joint of left shoulder 07/26/2008  . Goiter 07/14/2007   Joan [name], PT 08/16/16 9:16 AM Phone: [phone] Fax:  [phone]  Joan [name] 08/16/2016, 9:15 AM  [name] [name], [name], [address] Phone: [phone]  Fax:  [phone]  Name: Joan [name] MRN: [id number] Date of Birth: 06/30/1956

## 2016-08-16 NOTE — Therapy (Signed)
[facility] [address], [place], [name], [address] Phone: [phone]   Fax:  [phone]  Physical Therapy Treatment  Patient Details  Name: Joan [name] MRN: [id number] Date of Birth: 1956-10-27 Referring Provider: Dr. Quay Burow  Encounter Date: 08/16/2016      PT End of Session - 08/16/16 0826    Visit Number 14   Date for PT Re-Evaluation 09/26/16   PT Start Time 0803   PT Stop Time 0843   PT Time Calculation (min) 40 min   Activity Tolerance Patient tolerated treatment well      Past Medical History:  Diagnosis Date  . Anemia    due to Dysmenorrhea  . Hypertension    initially after BCP initiated   . Thyroid disease 2005 & 2009   nodule    Past Surgical History:  Procedure Laterality Date  . ABDOMINAL HYSTERECTOMY  1998   for heavy menses and fibroids  . BIOPSY THYROID  2005,2009   both negative  . COLONOSCOPY  2009    Dr Annette [facility]  . EXPLORATORY LAPAROTOMY  1985   Ovarian cyst  . [place]    LLQ pain due to ovarian cyst  . TONSILLECTOMY      There were no vitals filed for this visit.      Subjective Assessment - 08/16/16 0759    Subjective I felt better yesterday but my spine has been achy.  Sleeping on shoulder is painful.  I had a laser treatment on my left neck and shoulder.     Currently in Pain? Yes   Pain Score 3    Pain Location Shoulder   Pain Orientation Left   Pain Type Chronic pain   Pain Frequency Intermittent                         [facility] Adult PT Treatment/Exercise - 08/16/16 0001      Neck Exercises: Supine   Neck Retraction 10 reps   Capital Flexion 10 reps     Neck Exercises: Prone   Other Prone Exercise head lift with UE extension and horizontal abduction 8x each   Other Prone Exercise multifidi press with HS curls and hip extension 5x each l     Lumbar Exercises: Supine   Ab Set 10 reps   Isometric Hip Flexion  10 reps     Knee/Hip Exercises: Stretches   Active Hamstring Stretch Right;Left;3 reps;20 seconds     Knee/Hip Exercises: Standing   Hip Abduction Stengthening;Right;Left;10 reps   Abduction Limitations red band   Hip Extension Stengthening;Right;Left;10 reps   Extension Limitations red band     Shoulder Exercises: ROM/Strengthening   Other ROM/Strengthening Exercises Nu-Step L3 7 min                PT Education - 08/16/16 0825    Education provided Yes   Education Details prone multifidi series   Person(s) Educated Patient   Methods Explanation;Demonstration;Handout   Comprehension Verbalized understanding;Returned demonstration          PT Short Term Goals - 08/16/16 0910      PT SHORT TERM GOAL #1   Title The patient will demonstrate improved postural correction and safe body mechanics related to osteopenia   06/28/16   Status Achieved     PT SHORT TERM GOAL #2   Title The patient will have improved cervical rotation and thoracic rotation to 35 degrees needed  for driving     Status Achieved     PT SHORT TERM GOAL #3   Title The patient will report a 25% improvement in pain with standing to cook   Status Achieved     PT SHORT TERM GOAL #4   Title Patient will have improved thoracic extension to 5 degrees needed to decrease vertebral body compression and improve UE ovehead use   Status Achieved           PT Long Term Goals - 08/16/16 0910      PT LONG TERM GOAL #1   Title The patient will be independent in safe self progression of HEP needed for further improvements in pain, strength, ROM and posture   09/26/16   Time 8   Period Weeks   Status On-going     PT LONG TERM GOAL #2   Title The patient will report a 60% improvement in pain with standing to cook > 30 min   Time 8   Period Weeks   Status On-going     PT LONG TERM GOAL #3   Title The patient will have improved core and scapular muscle strength to grossly 4/5 needed to stand for > 40 min    Time 8   Period Weeks   Status Partially Met     PT LONG TERM GOAL #4   Title Cervical  rotation improved to 40 degrees needed for driving   Time 8   Period Weeks   Status On-going     PT LONG TERM GOAL #5   Title FOTO functional outcome score improved from 50% limitation to 40% indicating improved function with less pain   Time 8   Period Weeks   Status On-going               Plan - 08/16/16 0841    Clinical Impression Statement Patient reports multi region discomfort which she attributes to arthritis.  She is able to participate in moderate level intensity ex for lower abdominal, lumbar multifidi, deep cervical flexors, scapular stabilizers and gluteals.  No increase in pain following treatment session.   Should  meet remaining goals in about 4-5 visits.   Therapist closely monitoring response with all.     PT Next Visit Plan add red band hip abduction and extension standing to HEP;  sit to stand;  core strengthening;  cervical flexor strengthening      Patient will benefit from skilled therapeutic intervention in order to improve the following deficits and impairments:     Visit Diagnosis: Pain in thoracic spine  Muscle weakness (generalized)  Abnormal posture  Pain in left shoulder     Problem List Patient Active Problem List   Diagnosis Date Noted  . Gallbladder polyp 05/20/2016  . Fatty liver 05/20/2016  . Elevated LFTs 05/02/2016  . Obesity 05/01/2016  . Abdominal discomfort 05/01/2016  . Proteinuria 03/06/2015  . Unspecified vitamin D deficiency 07/14/2013  . LOW BACK PAIN SYNDROME 01/23/2010  . Status post radioactive iodine thyroid ablation 08/17/2009  . Hyperlipidemia 07/26/2008  . ANEMIA-NOS 07/26/2008  . Essential hypertension 07/26/2008  . Pain in joint of left shoulder 07/26/2008  . Goiter 07/14/2007    [name] 08/16/2016, 9:12 AM   [facility] [address] [place], [name], [address] Phone: [phone]   Fax:  [phone]  Name: Teagyn Fishel MRN: [id number] Date of Birth: 23-Oct-1956

## 2016-08-20 ENCOUNTER — Encounter: Payer: 59 | Admitting: Physical Therapy

## 2016-08-22 ENCOUNTER — Encounter: Payer: 59 | Admitting: Physical Therapy

## 2016-08-22 ENCOUNTER — Ambulatory Visit: Payer: 59 | Admitting: Physical Therapy

## 2016-08-22 DIAGNOSIS — R293 Abnormal posture: Secondary | ICD-10-CM

## 2016-08-22 DIAGNOSIS — M546 Pain in thoracic spine: Secondary | ICD-10-CM

## 2016-08-22 DIAGNOSIS — M6281 Muscle weakness (generalized): Secondary | ICD-10-CM | POA: Diagnosis not present

## 2016-08-22 DIAGNOSIS — M25512 Pain in left shoulder: Secondary | ICD-10-CM

## 2016-08-22 NOTE — Patient Instructions (Signed)
Kostantinos Tallman PT [facility] [address], [address] [place], [place] [address] Phone # [phone] Fax [phone]    

## 2016-08-22 NOTE — Therapy (Signed)
[facility] [address], [place], [name], [address] Phone: [phone]   Fax:  [phone]  Physical Therapy Treatment  Patient Details  Name: Joan [name] MRN: [id number] Date of Birth: 30-Jul-1956 Referring Provider: Dr. Quay Burow  Encounter Date: 08/22/2016      PT End of Session - 08/22/16 1726    Visit Number 15   Date for PT Re-Evaluation 09/26/16   PT Start Time 0800   PT Stop Time 0843   PT Time Calculation (min) 43 min   Activity Tolerance Patient tolerated treatment well      Past Medical History:  Diagnosis Date  . Anemia    due to Dysmenorrhea  . Hypertension    initially after BCP initiated   . Thyroid disease 2005 & 2009   nodule    Past Surgical History:  Procedure Laterality Date  . ABDOMINAL HYSTERECTOMY  1998   for heavy menses and fibroids  . BIOPSY THYROID  2005,2009   both negative  . COLONOSCOPY  2009    Dr Annette [facility]  . EXPLORATORY LAPAROTOMY  1985   Ovarian cyst  . [place]    LLQ pain due to ovarian cyst  . TONSILLECTOMY      There were no vitals filed for this visit.      Subjective Assessment - 08/22/16 0803    Subjective My back woke me up this morning, mid back and waist.  My joints are achy, even my thumbs hurt.  I worked late last night.  I only took Alleve once this week.     Currently in Pain? Yes   Pain Score 4    Pain Location Back   Pain Orientation Lower   Pain Type Chronic pain   Aggravating Factors  patient unable to identify aggravating factors                         [facility] Adult PT Treatment/Exercise - 08/22/16 0001      Neck Exercises: Prone   Other Prone Exercise head lift with UE extension and horizontal abduction 8x each   Other Prone Exercise multifidi press with HS curls,bent knee lift and hip extension 5x each l     Lumbar Exercises: Standing   Other Standing Lumbar Exercises ball lift knee to  chest squat method 8x   Other Standing Lumbar Exercises golfers lift 5x right and left     Lumbar Exercises: Supine   Ab Set 5 reps   Isometric Hip Flexion 10 reps     Knee/Hip Exercises: Standing   Hip Abduction Stengthening;Right;Left;10 reps   Abduction Limitations red band   Hip Extension Stengthening;Right;Left;10 reps   Extension Limitations red band   Other Standing Knee Exercises sit to stand from high table 10x no arms       Patient asks about Pilates and discussed appropriate community options.           PT Education - 08/22/16 1726    Education provided Yes   Education Details red band standing hip extension and abduction   Person(s) Educated Patient   Methods Explanation;Demonstration;Handout   Comprehension Verbalized understanding;Returned demonstration          PT Short Term Goals - 08/22/16 1730      PT SHORT TERM GOAL #1   Title The patient will demonstrate improved postural correction and safe body mechanics related to osteopenia   06/28/16   Status Achieved  PT SHORT TERM GOAL #2   Title The patient will have improved cervical rotation and thoracic rotation to 35 degrees needed for driving     Status Achieved     PT SHORT TERM GOAL #3   Title The patient will report a 25% improvement in pain with standing to cook   Status Achieved     PT SHORT TERM GOAL #4   Title Patient will have improved thoracic extension to 5 degrees needed to decrease vertebral body compression and improve UE ovehead use   Status Achieved           PT Long Term Goals - 08/22/16 1731      PT LONG TERM GOAL #1   Title The patient will be independent in safe self progression of HEP needed for further improvements in pain, strength, ROM and posture   09/26/16   Time 8   Period Weeks   Status On-going     PT LONG TERM GOAL #2   Title The patient will report a 60% improvement in pain with standing to cook > 30 min   Time 8   Period Weeks   Status On-going      PT LONG TERM GOAL #3   Title The patient will have improved core and scapular muscle strength to grossly 4/5 needed to stand for > 40 min   Time 8   Period Weeks   Status Partially Met     PT LONG TERM GOAL #4   Title Cervical  rotation improved to 40 degrees needed for driving   Time 8   Period Weeks   Status On-going     PT LONG TERM GOAL #5   Title FOTO functional outcome score improved from 50% limitation to 40% indicating improved function with less pain   Time 8   Period Weeks   Status On-going               Plan - 08/22/16 1727    Clinical Impression Statement The patient requires verbal and tactile cues with proper form with lifting.  Fewer cues needed to activate lumbar multifidi.  She is able to participate in moderate intensity exercises without exacerbation of pain, possibly better following treatment session.  Should meet remaining goals in 3-4 visits.     PT Next Visit Plan lifting practice squat and golfers lift;  hip and core strengthening;  recheck cervical rotation ROM      Patient will benefit from skilled therapeutic intervention in order to improve the following deficits and impairments:     Visit Diagnosis: Pain in thoracic spine  Muscle weakness (generalized)  Abnormal posture  Pain in left shoulder     Problem List Patient Active Problem List   Diagnosis Date Noted  . Gallbladder polyp 05/20/2016  . Fatty liver 05/20/2016  . Elevated LFTs 05/02/2016  . Obesity 05/01/2016  . Abdominal discomfort 05/01/2016  . Proteinuria 03/06/2015  . Unspecified vitamin D deficiency 07/14/2013  . LOW BACK PAIN SYNDROME 01/23/2010  . Status post radioactive iodine thyroid ablation 08/17/2009  . Hyperlipidemia 07/26/2008  . ANEMIA-NOS 07/26/2008  . Essential hypertension 07/26/2008  . Pain in joint of left shoulder 07/26/2008  . Goiter 07/14/2007   [name], PT 08/22/16 5:34 PM Phone: [phone] Fax: [phone]  [name] 08/22/2016, 5:32 PM  [facility] [address] [place], [name], [address] Phone: [phone]   Fax:  [phone]  Name: Bentley Haralson MRN:  [id number] Date of Birth: Sep 12, 1956

## 2016-08-23 DIAGNOSIS — H524 Presbyopia: Secondary | ICD-10-CM | POA: Diagnosis not present

## 2016-08-29 ENCOUNTER — Ambulatory Visit: Payer: 59 | Attending: Internal Medicine | Admitting: Physical Therapy

## 2016-08-29 ENCOUNTER — Encounter: Payer: Self-pay | Admitting: Physical Therapy

## 2016-08-29 DIAGNOSIS — G8929 Other chronic pain: Secondary | ICD-10-CM | POA: Diagnosis not present

## 2016-08-29 DIAGNOSIS — H524 Presbyopia: Secondary | ICD-10-CM | POA: Diagnosis not present

## 2016-08-29 DIAGNOSIS — M6281 Muscle weakness (generalized): Secondary | ICD-10-CM | POA: Insufficient documentation

## 2016-08-29 DIAGNOSIS — M25512 Pain in left shoulder: Secondary | ICD-10-CM | POA: Insufficient documentation

## 2016-08-29 DIAGNOSIS — R293 Abnormal posture: Secondary | ICD-10-CM | POA: Insufficient documentation

## 2016-08-29 DIAGNOSIS — M546 Pain in thoracic spine: Secondary | ICD-10-CM | POA: Insufficient documentation

## 2016-08-29 NOTE — Therapy (Signed)
[facility] [address], [place], [name], [address] Phone: [phone]   Fax:  [phone]  Physical Therapy Treatment  Patient Details  Name: Joan [name] MRN: [id number] Date of Birth: 20-May-1956 Referring Provider: Dr. Quay Burow  Encounter Date: 08/29/2016      PT End of Session - 08/29/16 0811    Visit Number 16   Date for PT Re-Evaluation 09/26/16   PT Start Time 0806   PT Stop Time 0845   PT Time Calculation (min) 39 min   Activity Tolerance Patient tolerated treatment well   Behavior During Therapy [facility] for tasks assessed/performed      Past Medical History:  Diagnosis Date  . Anemia    due to Dysmenorrhea  . Hypertension    initially after BCP initiated   . Thyroid disease 2005 & 2009   nodule    Past Surgical History:  Procedure Laterality Date  . ABDOMINAL HYSTERECTOMY  1998   for heavy menses and fibroids  . BIOPSY THYROID  2005,2009   both negative  . COLONOSCOPY  2009    Dr Annette [facility]  . EXPLORATORY LAPAROTOMY  1985   Ovarian cyst  . [place]    LLQ pain due to ovarian cyst  . TONSILLECTOMY      There were no vitals filed for this visit.      Subjective Assessment - 08/29/16 0809    Subjective Took pain pill last night. Has been having trouble getting around and sitting.    Pertinent History hx of hip and shoulder bursitis;  osteopenia   Limitations Standing;Sitting   Currently in Pain? Yes   Pain Score 4    Pain Location Back   Pain Orientation Lower   Pain Descriptors / Indicators Aching;Tightness   Pain Type Chronic pain   Multiple Pain Sites No                         [facility] Adult PT Treatment/Exercise - 08/29/16 0001      Exercises   Exercises Knee/Hip     Neck Exercises: Prone   Other Prone Exercise head lift with UE extension and horizontal abduction 8x each     Lumbar Exercises: Standing   Other Standing Lumbar  Exercises ball lift knee to chest squat method 8x   Other Standing Lumbar Exercises golfers lift 5x right and left     Lumbar Exercises: Supine   Ab Set 5 reps     Knee/Hip Exercises: Stretches   Active Hamstring Stretch Both;2 reps;10 seconds   Piriformis Stretch Both;2 reps;10 seconds     Knee/Hip Exercises: Standing   Other Standing Knee Exercises sit to stand holding yellow ball     Knee/Hip Exercises: Supine   Straight Leg Raises Strengthening;Both;2 sets;10 reps     Knee/Hip Exercises: Sidelying   Hip ABduction Strengthening;Both;2 sets;10 reps   Hip ADduction Strengthening;Both;2 sets;10 reps     Knee/Hip Exercises: Prone   Hip Extension Strengthening;Both;2 sets;10 reps                  PT Short Term Goals - 08/29/16 0811      PT SHORT TERM GOAL #1   Title The patient will demonstrate improved postural correction and safe body mechanics related to osteopenia   06/28/16   Time 4   Period Weeks   Status Achieved     PT SHORT TERM GOAL #2   Title  The patient will have improved cervical rotation and thoracic rotation to 35 degrees needed for driving     Time 4   Period Weeks   Status Achieved     PT SHORT TERM GOAL #3   Title The patient will report a 25% improvement in pain with standing to cook   Time 4   Period Weeks   Status Achieved     PT SHORT TERM GOAL #4   Title Patient will have improved thoracic extension to 5 degrees needed to decrease vertebral body compression and improve UE ovehead use   Time 4   Period Weeks   Status Achieved           PT Long Term Goals - 08/29/16 0258      PT LONG TERM GOAL #1   Title The patient will be independent in safe self progression of HEP needed for further improvements in pain, strength, ROM and posture   09/26/16   Time 8   Period Weeks   Status On-going     PT LONG TERM GOAL #2   Title The patient will report a 60% improvement in pain with standing to cook > 30 min   Time 8   Period Weeks    Status On-going     PT LONG TERM GOAL #3   Title The patient will have improved core and scapular muscle strength to grossly 4/5 needed to stand for > 40 min   Time 8   Period Weeks   Status Partially Met     PT LONG TERM GOAL #4   Title Cervical  rotation improved to 40 degrees needed for driving   Time 8   Period Weeks   Status On-going     PT LONG TERM GOAL #5   Title FOTO functional outcome score improved from 50% limitation to 40% indicating improved function with less pain   Time 8   Period Weeks   Status On-going               Plan - 08/29/16 5277    Clinical Impression Statement Pt continues to have low back pain that is making movement and prolonged sitting difficult. Pt needs verbal cues for proper technique with core exercises. Practiced proper body mechanics and squating techniques to improve transfers and strength for body mechanics.  Pt will continue to benefit from skilled therapy for strengthening and core stabilization.    Rehab Potential Good   Clinical Impairments Affecting Rehab Potential osteopenia    PT Frequency 2x / week   PT Duration 8 weeks   PT Treatment/Interventions ADLs/Self Care Home Management;Iontophoresis 52m/ml Dexamethasone;Cryotherapy;Electrical Stimulation;Moist Heat;Ultrasound;Therapeutic exercise;Neuromuscular re-education;Patient/family education;Manual techniques;Dry needling;Taping   PT Next Visit Plan Strengthening, squating and lifting techniues   Consulted and Agree with Plan of Care Patient      Patient will benefit from skilled therapeutic intervention in order to improve the following deficits and impairments:  Decreased range of motion, Decreased strength, Pain, Postural dysfunction, Impaired UE functional use  Visit Diagnosis: Pain in thoracic spine  Muscle weakness (generalized)  Abnormal posture     Problem List Patient Active Problem List   Diagnosis Date Noted  . Gallbladder polyp 05/20/2016  . Fatty liver  05/20/2016  . Elevated LFTs 05/02/2016  . Obesity 05/01/2016  . Abdominal discomfort 05/01/2016  . Proteinuria 03/06/2015  . Unspecified vitamin D deficiency 07/14/2013  . LOW BACK PAIN SYNDROME 01/23/2010  . Status post radioactive iodine thyroid ablation 08/17/2009  . Hyperlipidemia  07/26/2008  . ANEMIA-NOS 07/26/2008  . Essential hypertension 07/26/2008  . Pain in joint of left shoulder 07/26/2008  . Goiter 07/14/2007    Mikle Bosworth PTA 08/29/2016, 9:02 AM  [facility] [address] [place], [name], [address] Phone: [phone]   Fax:  [phone]  Name: Joan [name] MRN: [id number] Date of Birth: 11/09/1956

## 2016-09-02 ENCOUNTER — Encounter: Payer: Self-pay | Admitting: Physical Therapy

## 2016-09-02 ENCOUNTER — Ambulatory Visit: Payer: 59 | Admitting: Physical Therapy

## 2016-09-02 DIAGNOSIS — M25512 Pain in left shoulder: Secondary | ICD-10-CM | POA: Diagnosis not present

## 2016-09-02 DIAGNOSIS — M546 Pain in thoracic spine: Secondary | ICD-10-CM

## 2016-09-02 DIAGNOSIS — R293 Abnormal posture: Secondary | ICD-10-CM

## 2016-09-02 DIAGNOSIS — H524 Presbyopia: Secondary | ICD-10-CM | POA: Diagnosis not present

## 2016-09-02 DIAGNOSIS — G8929 Other chronic pain: Secondary | ICD-10-CM

## 2016-09-02 DIAGNOSIS — M6281 Muscle weakness (generalized): Secondary | ICD-10-CM | POA: Diagnosis not present

## 2016-09-02 NOTE — Therapy (Addendum)
[facility] [address] [place], [name], [address] Phone: [phone]   Fax:  [phone]  Physical Therapy Treatment  Patient Details  Name: Joan [name] MRN: [id number] Date of Birth: 02/26/1956 Referring Provider: Dr. Quay Burow  Encounter Date: 09/02/2016      PT End of Session - 09/02/16 0808    Visit Number 9   PT Start Time 0803   PT Stop Time 0844   PT Time Calculation (min) 41 min   Activity Tolerance Patient tolerated treatment well   Behavior During Therapy [facility] for tasks assessed/performed      Past Medical History:  Diagnosis Date  . Anemia    due to Dysmenorrhea  . Hypertension    initially after BCP initiated   . Thyroid disease 2005 & 2009   nodule    Past Surgical History:  Procedure Laterality Date  . ABDOMINAL HYSTERECTOMY  1998   for heavy menses and fibroids  . BIOPSY THYROID  2005,2009   both negative  . COLONOSCOPY  2009    Dr Annette [facility]  . EXPLORATORY LAPAROTOMY  1985   Ovarian cyst  . [place]    LLQ pain due to ovarian cyst  . TONSILLECTOMY      There were no vitals filed for this visit.      Subjective Assessment - 09/02/16 0806    Subjective Reports no pain in low back but some pain between shoulder blades in upper back today. Stated she felt pretty good after last therapy session.    Pertinent History hx of hip and shoulder bursitis;  osteopenia   Limitations Standing;Sitting   How long can you sit comfortably? 1 hour   How long can you stand comfortably? 30 min   Diagnostic tests U/S right side showed fatty liver;  bone density shows osteopenia   Patient Stated Goals I want to lose 40#   Currently in Pain? Yes   Pain Score 3    Pain Location Neck   Pain Descriptors / Indicators Aching;Tightness   Pain Type Chronic pain                         [facility] Adult PT Treatment/Exercise - 09/02/16 0001      Neck Exercises:  Prone   Other Prone Exercise head lift with UE extension and horizontal abduction 8x each     Lumbar Exercises: Standing   Other Standing Lumbar Exercises ball lift knee to chest squat method 8x   Other Standing Lumbar Exercises golfers lift 5x right and left     Knee/Hip Exercises: Stretches   Active Hamstring Stretch Both;2 reps;10 seconds   Piriformis Stretch Both;2 reps;10 seconds     Knee/Hip Exercises: Aerobic   Stationary Bike L1 x 6 minutes     Knee/Hip Exercises: Supine   Straight Leg Raises Strengthening;Both;2 sets;10 reps     Knee/Hip Exercises: Sidelying   Hip ABduction Strengthening;Both;2 sets;10 reps   Hip ADduction Strengthening;Both;2 sets;10 reps     Knee/Hip Exercises: Prone   Hip Extension Strengthening;Both;2 sets;10 reps     Shoulder Exercises: Supine   Horizontal ABduction Strengthening;Both;20 reps;Theraband   Theraband Level (Shoulder Horizontal ABduction) Level 2 (Red)   Flexion AROM;Both;10 reps  with rolled towel behind back     Shoulder Exercises: Standing   Extension Strengthening;Both;20 reps;Theraband   Theraband Level (Shoulder Extension) Level 2 (Red)   Row Strengthening;Both;20 reps;Theraband   Theraband Level (Shoulder  Row) Level 2 (Red)     Shoulder Exercises: Stretch   Other Shoulder Stretches Chest stretch  supine with towel behind back                  PT Short Term Goals - 09/02/16 0809      PT SHORT TERM GOAL #1   Title The patient will demonstrate improved postural correction and safe body mechanics related to osteopenia   06/28/16   Time 4   Period Weeks   Status Achieved     PT SHORT TERM GOAL #2   Title The patient will have improved cervical rotation and thoracic rotation to 35 degrees needed for driving     Time 4   Period Weeks   Status Achieved     PT SHORT TERM GOAL #3   Title The patient will report a 25% improvement in pain with standing to cook   Time 4   Period Weeks   Status Achieved     PT  SHORT TERM GOAL #4   Title Patient will have improved thoracic extension to 5 degrees needed to decrease vertebral body compression and improve UE ovehead use   Time 4   Period Weeks   Status Achieved           PT Long Term Goals - 09/02/16 0809      PT LONG TERM GOAL #1   Title The patient will be independent in safe self progression of HEP needed for further improvements in pain, strength, ROM and posture   09/26/16   Time 8   Period Weeks   Status On-going     PT LONG TERM GOAL #2   Title The patient will report a 60% improvement in pain with standing to cook > 30 min   Time 8   Period Weeks   Status On-going  Pt stated she has not been cooking     PT LONG TERM GOAL #3   Title The patient will have improved core and scapular muscle strength to grossly 4/5 needed to stand for > 40 min   Time 8   Period Weeks   Status Partially Met     PT LONG TERM GOAL #4   Title Cervical  rotation improved to 40 degrees needed for driving   Time 8   Period Weeks   Status On-going     PT LONG TERM GOAL #5   Title FOTO functional outcome score improved from 50% limitation to 40% indicating improved function with less pain   Time 8   Period Weeks   Status On-going               Plan - 09/02/16 0834    Clinical Impression Statement Pt reports no low back pain today but continues to have pain in upper back. Pt presents with rounded shoulder and decreased extensor muscles. Pt able to complete all exercises well needing some verbal cues for proper technique for appropriate muscle strengthening. Pt would continue to benefit from skilled therapy for core strengthening and postural training.    Rehab Potential Good   Clinical Impairments Affecting Rehab Potential osteopenia    PT Frequency 2x / week   PT Duration 8 weeks   PT Treatment/Interventions ADLs/Self Care Home Management;Iontophoresis 54m/ml Dexamethasone;Cryotherapy;Electrical Stimulation;Moist Heat;Ultrasound;Therapeutic  exercise;Neuromuscular re-education;Patient/family education;Manual techniques;Dry needling;Taping   PT Next Visit Plan Strengthening, squating and lifting techniues   Consulted and Agree with Plan of Care Patient      Patient  will benefit from skilled therapeutic intervention in order to improve the following deficits and impairments:  Decreased range of motion, Decreased strength, Pain, Postural dysfunction, Impaired UE functional use  Visit Diagnosis: Pain in thoracic spine  Muscle weakness (generalized)  Abnormal posture  Chronic left shoulder pain     Problem List Patient Active Problem List   Diagnosis Date Noted  . Gallbladder polyp 05/20/2016  . Fatty liver 05/20/2016  . Elevated LFTs 05/02/2016  . Obesity 05/01/2016  . Abdominal discomfort 05/01/2016  . Proteinuria 03/06/2015  . Unspecified vitamin D deficiency 07/14/2013  . LOW BACK PAIN SYNDROME 01/23/2010  . Status post radioactive iodine thyroid ablation 08/17/2009  . Hyperlipidemia 07/26/2008  . ANEMIA-NOS 07/26/2008  . Essential hypertension 07/26/2008  . Pain in joint of left shoulder 07/26/2008  . Goiter 07/14/2007    Mikle Bosworth PTA 09/02/2016, 8:47 AM  [facility] [address], [place], [name], [address] Phone: [phone]   Fax:  [phone]  Name: Joan [name] MRN: [id number] Date of Birth: [birth year]

## 2016-09-06 ENCOUNTER — Encounter: Payer: Self-pay | Admitting: Physical Therapy

## 2016-09-06 ENCOUNTER — Ambulatory Visit: Payer: 59 | Admitting: Physical Therapy

## 2016-09-06 DIAGNOSIS — M6281 Muscle weakness (generalized): Secondary | ICD-10-CM | POA: Diagnosis not present

## 2016-09-06 DIAGNOSIS — R293 Abnormal posture: Secondary | ICD-10-CM

## 2016-09-06 DIAGNOSIS — M546 Pain in thoracic spine: Secondary | ICD-10-CM | POA: Diagnosis not present

## 2016-09-06 DIAGNOSIS — M25512 Pain in left shoulder: Secondary | ICD-10-CM | POA: Diagnosis not present

## 2016-09-06 DIAGNOSIS — G8929 Other chronic pain: Secondary | ICD-10-CM

## 2016-09-06 DIAGNOSIS — H524 Presbyopia: Secondary | ICD-10-CM | POA: Diagnosis not present

## 2016-09-06 NOTE — Patient Instructions (Signed)
Hip Flexion / Knee Extension: Straight-Leg Raise (Eccentric)   Lie on back. Lift leg with knee straight. Slowly lower leg for 3-5 seconds. _10__ reps per set, _2__ sets per day, _3__ days per week. Lower like elevator, stopping at each floor.  Rest on elbows. Rest on straight arms.  ABDUCTION: Side-Lying (Active)   Lie on left side, top leg straight. Raise top leg as far as possible.  Complete _2__ sets of _10__ repetitions. Perform __3_ sessions per week.  http://gtsc.exer.us/94   (Home) Extension: Hip   With support under abdomen, tighten stomach. Lift right leg in line with body. Do not hyperextend. Alternate legs. Repeat __10__ times per set. Do __2__ sets per session. Do _3___ sessions per week.  ADDUCTION: Side-Lying (Active)   Lie on right side, with top leg bent and in front of other leg. Lift straight leg up as high as possible.  Complete __2_ sets of _10__ repetitions. Perform _3__ sessions per week.  http://gtsc.exer.us/129   Copyright  VHI. All rights reserved.  Hamstring Step 1    Straighten left knee. Keep knee level with other knee or on bolster. Hold _10__ seconds. Relax knee by returning foot to start. Repeat _2__ times.  Copyright  VHI. All rights reserved.   Jeanie Sewer PTA [facility] [address], [place], [place] [address] Phone # [phone] Fax [phone]

## 2016-09-06 NOTE — Therapy (Signed)
[facility] [address], [place], [name], [address] Phone: [phone]   Fax:  [phone]  Physical Therapy Treatment  Patient Details  Name: Joan [name] MRN: [id number] Date of Birth: 05/26/1956 Referring Provider: Dr. Quay Burow  Encounter Date: 09/06/2016      PT End of Session - 09/06/16 0810    Visit Number 18   Date for PT Re-Evaluation 09/26/16   PT Start Time 0806   PT Stop Time 0846   PT Time Calculation (min) 40 min   Activity Tolerance Patient tolerated treatment well   Behavior During Therapy [facility] for tasks assessed/performed      Past Medical History:  Diagnosis Date  . Anemia    due to Dysmenorrhea  . Hypertension    initially after BCP initiated   . Thyroid disease 2005 & 2009   nodule    Past Surgical History:  Procedure Laterality Date  . ABDOMINAL HYSTERECTOMY  1998   for heavy menses and fibroids  . BIOPSY THYROID  2005,2009   both negative  . COLONOSCOPY  2009    Dr Annette [facility]  . EXPLORATORY LAPAROTOMY  1985   Ovarian cyst  . [place]    LLQ pain due to ovarian cyst  . TONSILLECTOMY      There were no vitals filed for this visit.      Subjective Assessment - 09/06/16 0808    Subjective Pt reports minimal soreness in low back. Neck feels about the same as usual but getting better. Pt felt good about all strengthening exercises at last session.   Pertinent History hx of hip and shoulder bursitis;  osteopenia   Limitations Standing;Sitting   How long can you sit comfortably? 1 hour   How long can you stand comfortably? 30 min   Diagnostic tests U/S right side showed fatty liver;  bone density shows osteopenia   Patient Stated Goals I want to lose 40#   Currently in Pain? Yes   Pain Score 3    Pain Location Neck   Pain Orientation Left   Pain Descriptors / Indicators Tightness   Pain Type Chronic pain   Multiple Pain Sites Yes   Pain Score 3    Pain Location Back   Pain Orientation Lower   Pain Descriptors / Indicators Aching   Pain Type Chronic pain                         [facility] Adult PT Treatment/Exercise - 09/06/16 0001      Neck Exercises: Prone   Other Prone Exercise head lift with UE extension and horizontal abduction 8x each     Knee/Hip Exercises: Stretches   Active Hamstring Stretch Both;2 reps;10 seconds  supine with strap   Piriformis Stretch Both;2 reps;10 seconds  supine     Knee/Hip Exercises: Aerobic   Stationary Bike L1 x 6 minutes     Knee/Hip Exercises: Supine   Straight Leg Raises Strengthening;Both;2 sets;10 reps     Knee/Hip Exercises: Sidelying   Hip ABduction Strengthening;Both;2 sets;10 reps   Hip ADduction Strengthening;Both;2 sets;10 reps     Knee/Hip Exercises: Prone   Hip Extension Strengthening;Both;2 sets;10 reps     Shoulder Exercises: Supine   Horizontal ABduction Strengthening;Both;20 reps;Theraband   Theraband Level (Shoulder Horizontal ABduction) Level 2 (Red)   Flexion AROM;Both;10 reps  with rolled towel behind back   Other Supine Exercises D2 extension red tband  Shoulder Exercises: Standing   Extension Strengthening;Both;20 reps;Theraband   Theraband Level (Shoulder Extension) Level 2 (Red)   Row Strengthening;Both;20 reps;Theraband   Theraband Level (Shoulder Row) Level 2 (Red)     Shoulder Exercises: Stretch   Other Shoulder Stretches Chest stretch  supine with towel behind back                PT Education - 09/06/16 0845    Education provided Yes   Education Details Straight leg raises   Person(s) Educated Patient   Methods Explanation;Demonstration;Handout   Comprehension Verbalized understanding          PT Short Term Goals - 09/02/16 0809      PT SHORT TERM GOAL #1   Title The patient will demonstrate improved postural correction and safe body mechanics related to osteopenia   06/28/16   Time 4   Period Weeks   Status  Achieved     PT SHORT TERM GOAL #2   Title The patient will have improved cervical rotation and thoracic rotation to 35 degrees needed for driving     Time 4   Period Weeks   Status Achieved     PT SHORT TERM GOAL #3   Title The patient will report a 25% improvement in pain with standing to cook   Time 4   Period Weeks   Status Achieved     PT SHORT TERM GOAL #4   Title Patient will have improved thoracic extension to 5 degrees needed to decrease vertebral body compression and improve UE ovehead use   Time 4   Period Weeks   Status Achieved           PT Long Term Goals - 09/02/16 9381      PT LONG TERM GOAL #1   Title The patient will be independent in safe self progression of HEP needed for further improvements in pain, strength, ROM and posture   09/26/16   Time 8   Period Weeks   Status On-going     PT LONG TERM GOAL #2   Title The patient will report a 60% improvement in pain with standing to cook > 30 min   Time 8   Period Weeks   Status On-going  Pt stated she has not been cooking     PT LONG TERM GOAL #3   Title The patient will have improved core and scapular muscle strength to grossly 4/5 needed to stand for > 40 min   Time 8   Period Weeks   Status Partially Met     PT LONG TERM GOAL #4   Title Cervical  rotation improved to 40 degrees needed for driving   Time 8   Period Weeks   Status On-going     PT LONG TERM GOAL #5   Title FOTO functional outcome score improved from 50% limitation to 40% indicating improved function with less pain   Time 8   Period Weeks   Status On-going               Plan - 09/06/16 0846    Clinical Impression Statement Pt felt good about all exercises at last session. Did well with all strengthening exercises. Continues to have some difficulty with squat technique for proper lifting and needs moderate verbal cues for adjustments. Pt will continue to benefit from skilled therapy for strengthening and training in  proper body mechanics.    Rehab Potential Good   Clinical Impairments Affecting Rehab Potential  osteopenia    PT Frequency 2x / week   PT Duration 8 weeks   PT Treatment/Interventions ADLs/Self Care Home Management;Iontophoresis 32m/ml Dexamethasone;Cryotherapy;Electrical Stimulation;Moist Heat;Ultrasound;Therapeutic exercise;Neuromuscular re-education;Patient/family education;Manual techniques;Dry needling;Taping   PT Next Visit Plan Body mechanics, lifting techniques   Consulted and Agree with Plan of Care Patient      Patient will benefit from skilled therapeutic intervention in order to improve the following deficits and impairments:  Decreased range of motion, Decreased strength, Pain, Postural dysfunction, Impaired UE functional use  Visit Diagnosis: Pain in thoracic spine  Muscle weakness (generalized)  Abnormal posture  Chronic left shoulder pain     Problem List Patient Active Problem List   Diagnosis Date Noted  . Gallbladder polyp 05/20/2016  . Fatty liver 05/20/2016  . Elevated LFTs 05/02/2016  . Obesity 05/01/2016  . Abdominal discomfort 05/01/2016  . Proteinuria 03/06/2015  . Unspecified vitamin D deficiency 07/14/2013  . LOW BACK PAIN SYNDROME 01/23/2010  . Status post radioactive iodine thyroid ablation 08/17/2009  . Hyperlipidemia 07/26/2008  . ANEMIA-NOS 07/26/2008  . Essential hypertension 07/26/2008  . Pain in joint of left shoulder 07/26/2008  . Goiter 07/14/2007    KMikle BosworthPTA 09/06/2016, 8:54 AM  [facility] [address] [place] [address]: [phone]  Fax:  [phone] Name: Joan RommelMRN: [id number] of Birth: 217-Mar-1957

## 2016-09-12 ENCOUNTER — Ambulatory Visit: Payer: 59 | Admitting: Physical Therapy

## 2016-09-12 DIAGNOSIS — M25512 Pain in left shoulder: Secondary | ICD-10-CM

## 2016-09-12 DIAGNOSIS — G8929 Other chronic pain: Secondary | ICD-10-CM

## 2016-09-12 DIAGNOSIS — R293 Abnormal posture: Secondary | ICD-10-CM

## 2016-09-12 DIAGNOSIS — H524 Presbyopia: Secondary | ICD-10-CM | POA: Diagnosis not present

## 2016-09-12 DIAGNOSIS — M546 Pain in thoracic spine: Secondary | ICD-10-CM | POA: Diagnosis not present

## 2016-09-12 DIAGNOSIS — M6281 Muscle weakness (generalized): Secondary | ICD-10-CM

## 2016-09-12 NOTE — Therapy (Signed)
[facility] [address] [place], [name], [address] Phone: [phone]   Fax:  [phone]  Physical Therapy Treatment  Patient Details  Name: Joan [name] MRN: [id number] Date of Birth: [birth year] Referring Provider: Dr. Quay Burow  Encounter Date: 09/12/2016      PT End of Session - 09/12/16 0822    Visit Number 19   Date for PT Re-Evaluation 09/26/16   PT Start Time 0804   PT Stop Time 0844   PT Time Calculation (min) 40 min   Activity Tolerance Patient tolerated treatment well      Past Medical History:  Diagnosis Date  . Anemia    due to Dysmenorrhea  . Hypertension    initially after BCP initiated   . Thyroid disease 2005 & 2009   nodule    Past Surgical History:  Procedure Laterality Date  . ABDOMINAL HYSTERECTOMY  1998   for heavy menses and fibroids  . BIOPSY THYROID  2005,2009   both negative  . COLONOSCOPY  2009    Dr Annette [facility]  . EXPLORATORY LAPAROTOMY  1985   Ovarian cyst  . [name]    LLQ pain due to ovarian cyst  . TONSILLECTOMY      There were no vitals filed for this visit.      Subjective Assessment - 09/12/16 0806    Subjective After last time had a spot in my back that hurt.   I have ups and downs with my back over the years.  My shoulder is better than it was but hurts to lie on that side.     Currently in Pain? Yes   Pain Score 3    Pain Location Back   Pain Type Chronic pain   Multiple Pain Sites No   Pain Score 0   Pain Location Shoulder                         [facility] Adult PT Treatment/Exercise - 09/12/16 0001      Lumbar Exercises: Supine   Ab Set 5 reps   Bent Knee Raise Limitations 10x    Isometric Hip Flexion 10 reps     Knee/Hip Exercises: Standing   Other Standing Knee Exercises sit to stand with abdominal brace 10x   Other Standing Knee Exercises lifting bolster from low height 8x     Knee/Hip Exercises:  Sidelying   Clams red band 10x right and left     Shoulder Exercises: Supine   Other Supine Exercises red band scapular series 6x each:  overhead, horizontal abduction, ER, sash     Shoulder Exercises: Standing   Row Strengthening;Both;20 reps;Theraband   Theraband Level (Shoulder Row) Level 2 (Red)     Shoulder Exercises: ROM/Strengthening   UBE (Upper Arm Bike) 6 min                  PT Short Term Goals - 09/12/16 0843      PT SHORT TERM GOAL #1   Title The patient will demonstrate improved postural correction and safe body mechanics related to osteopenia   06/28/16   Status Achieved     PT SHORT TERM GOAL #2   Title The patient will have improved cervical rotation and thoracic rotation to 35 degrees needed for driving     Status Achieved     PT SHORT TERM GOAL #3   Title The patient will report a 25%  improvement in pain with standing to cook   Status Achieved     PT SHORT TERM GOAL #4   Title Patient will have improved thoracic extension to 5 degrees needed to decrease vertebral body compression and improve UE ovehead use           PT Long Term Goals - 09/12/16 0845      PT LONG TERM GOAL #1   Title The patient will be independent in safe self progression of HEP needed for further improvements in pain, strength, ROM and posture   09/26/16   Period Weeks   Status On-going     PT LONG TERM GOAL #2   Title The patient will report a 60% improvement in pain with standing to cook > 30 min   Time 8   Period Weeks   Status On-going     PT LONG TERM GOAL #3   Title The patient will have improved core and scapular muscle strength to grossly 4/5 needed to stand for > 40 min   Time 8   Period Weeks   Status Partially Met     PT LONG TERM GOAL #4   Title Cervical  rotation improved to 40 degrees needed for driving   Period Weeks   Status On-going     PT LONG TERM GOAL #5   Title FOTO functional outcome score improved from 50% limitation to 40% indicating  improved function with less pain   Time 8   Period Weeks   Status On-going               Plan - 09/12/16 0131    Clinical Impression Statement The patient is receptive to core strengthening in neutral positioning and with sit to stand.   Decreasing verbal cues needed for abdominal bracing and coordination of breathing.  No exacerbation of pain with exercises.   Continue to establish HEP for independence upon discharge in 2 visits.     PT Next Visit Plan core strengthening; LE strengthening; lifting practice      Patient will benefit from skilled therapeutic intervention in order to improve the following deficits and impairments:     Visit Diagnosis: Pain in thoracic spine  Muscle weakness (generalized)  Abnormal posture  Chronic left shoulder pain     Problem List Patient Active Problem List   Diagnosis Date Noted  . Gallbladder polyp 05/20/2016  . Fatty liver 05/20/2016  . Elevated LFTs 05/02/2016  . Obesity 05/01/2016  . Abdominal discomfort 05/01/2016  . Proteinuria 03/06/2015  . Unspecified vitamin D deficiency 07/14/2013  . LOW BACK PAIN SYNDROME 01/23/2010  . Status post radioactive iodine thyroid ablation 08/17/2009  . Hyperlipidemia 07/26/2008  . ANEMIA-NOS 07/26/2008  . Essential hypertension 07/26/2008  . Pain in joint of left shoulder 07/26/2008  . Goiter 07/14/2007   [name], PT 09/12/16 8:52 AM Phone: [phone] Fax: [phone]  [name] 09/12/2016, 8:51 AM  [facility] [address], [place], [name], [address] Phone: [phone]   Fax:  [phone]  Name: Joan [name] MRN: [id number] Date of Birth: 11-02-1956

## 2016-09-19 ENCOUNTER — Ambulatory Visit: Payer: 59 | Admitting: Physical Therapy

## 2016-09-19 ENCOUNTER — Encounter: Payer: Self-pay | Admitting: Physical Therapy

## 2016-09-19 DIAGNOSIS — R293 Abnormal posture: Secondary | ICD-10-CM

## 2016-09-19 DIAGNOSIS — M546 Pain in thoracic spine: Secondary | ICD-10-CM | POA: Diagnosis not present

## 2016-09-19 DIAGNOSIS — H524 Presbyopia: Secondary | ICD-10-CM | POA: Diagnosis not present

## 2016-09-19 DIAGNOSIS — M25512 Pain in left shoulder: Secondary | ICD-10-CM | POA: Diagnosis not present

## 2016-09-19 DIAGNOSIS — M6281 Muscle weakness (generalized): Secondary | ICD-10-CM | POA: Diagnosis not present

## 2016-09-19 DIAGNOSIS — G8929 Other chronic pain: Secondary | ICD-10-CM | POA: Diagnosis not present

## 2016-09-19 NOTE — Patient Instructions (Addendum)
  PNF Strengthening: Resisted   Standing with resistive band around each hand, bring right arm up and away, thumb back. Repeat _10___ times per set. Do _2___ sets per session. Do _1-2___ sessions per day. Red band. Resisted Horizontal Abduction: Bilateral   Sit or stand, tubing in both hands, arms out in front. Keeping arms straight, pinch shoulder blades together and stretch arms out. Repeat _10___ times per set. Do 2____ sets per session. Do _1-2___ sessions per day. Red band  Scapular Retraction: Elbow Flexion (Standing)   With elbows bent to 90, pinch shoulder blades together and rotate arms out, keeping elbows bent. Repeat _10___ times per set. Do _1___ sets per session. Do many____ sessions per day.    Strengthening: Resisted Extension   Hold tubing in right hand, arm forward. Pull arm back, elbow straight. Repeat _10___ times per set. Do _2___ sets per session. Do _1-2___ sessions per day.  Can place band around the front of your body and perform with both arms at the same time.  [place] [address], [place], [place] [address] Phone # [phone] Fax [phone]

## 2016-09-19 NOTE — Therapy (Signed)
[facility] [address], [place], [name], [address] Phone: [phone]   Fax:  [phone]  Physical Therapy Treatment  Patient Details  Name: Joan [name] MRN: [id number] Date of Birth: [birth year] Referring Provider: Dr. Quay Burow  Encounter Date: 09/19/2016      PT End of Session - 09/19/16 0842    Visit Number 20   Date for PT Re-Evaluation 09/26/16   PT Start Time 0802   PT Stop Time 0842   PT Time Calculation (min) 40 min   Activity Tolerance Patient tolerated treatment well   Behavior During Therapy [facility] for tasks assessed/performed      Past Medical History:  Diagnosis Date  . Anemia    due to Dysmenorrhea  . Hypertension    initially after BCP initiated   . Thyroid disease 2005 & 2009   nodule    Past Surgical History:  Procedure Laterality Date  . ABDOMINAL HYSTERECTOMY  1998   for heavy menses and fibroids  . BIOPSY THYROID  2005,2009   both negative  . COLONOSCOPY  2009    Dr Annette [facility]  . EXPLORATORY LAPAROTOMY  1985   Ovarian cyst  . [place]    LLQ pain due to ovarian cyst  . TONSILLECTOMY      There were no vitals filed for this visit.      Subjective Assessment - 09/19/16 0814    Subjective Today the pain is between my shoulder pain.  Today I stood up for hour and had increased pain.  I have been trying to strengthen my core.    Pertinent History hx of hip and shoulder bursitis;  osteopenia   Limitations Standing;Sitting   How long can you sit comfortably? 1 hour   How long can you stand comfortably? 30 min   Diagnostic tests U/S right side showed fatty liver;  bone density shows osteopenia   Patient Stated Goals I want to lose 40#   Currently in Pain? Yes   Pain Score 4    Pain Location Thoracic   Pain Orientation Mid   Pain Descriptors / Indicators Aching   Pain Type Chronic pain   Pain Onset More than a month ago   Pain Frequency Intermittent    Aggravating Factors  patietn unable to identify aggravating factors   Pain Relieving Factors Alleve            [facility] PT Assessment - 09/19/16 0001      AROM   Cervical - Right Rotation 45   Cervical - Left Rotation 60                     [facility] Adult PT Treatment/Exercise - 09/19/16 0001      Self-Care   Self-Care Other Self-Care Comments   Other Self-Care Comments  Discussed with patient on walking program and how important to include in her day     Therapeutic Activites    Therapeutic Activities Other Therapeutic Activities  posture in sitting and standing     Lumbar Exercises: Supine   Bent Knee Raise Limitations 10x    Isometric Hip Flexion 10 reps   Isometric Hip Flexion Limitations hold 5 seconds     Knee/Hip Exercises: Aerobic   Elliptical level 1 forward 2 min and backward 2 min   Nustep 10 min level 10 min arms and legs; seat #9, arm #10  PT Education - 09/19/16 [phone]    Education provided Yes   Education Details interscapular strengthening   Person(s) Educated Patient   Methods Explanation;Demonstration;Verbal cues;Handout   Comprehension Returned demonstration;Verbalized understanding          PT Short Term Goals - 09/19/16 0835      PT SHORT TERM GOAL #1   Title The patient will demonstrate improved postural correction and safe body mechanics related to osteopenia   06/28/16   Time 4   Period Weeks   Status Achieved     PT SHORT TERM GOAL #2   Title The patient will have improved cervical rotation and thoracic rotation to 35 degrees needed for driving     Time 4   Period Weeks   Status Achieved     PT SHORT TERM GOAL #3   Title The patient will report a 25% improvement in pain with standing to cook   Time 4   Period Weeks   Status Achieved     PT SHORT TERM GOAL #4   Title Patient will have improved thoracic extension to 5 degrees needed to decrease vertebral body compression and improve UE ovehead use    Time 4   Period Weeks   Status Achieved           PT Long Term Goals - 09/19/16 3235      PT LONG TERM GOAL #1   Title The patient will be independent in safe self progression of HEP needed for further improvements in pain, strength, ROM and posture   09/26/16   Time 8   Period Weeks   Status On-going     PT LONG TERM GOAL #2   Title The patient will report a 60% improvement in pain with standing to cook > 30 min   Time 8   Period Weeks   Status On-going  30% improvement     PT LONG TERM GOAL #3   Title The patient will have improved core and scapular muscle strength to grossly 4/5 needed to stand for > 40 min   Time 8   Period Weeks   Status Partially Met     PT LONG TERM GOAL #4   Title Cervical  rotation improved to 40 degrees needed for driving   Time 8   Period Weeks   Status Achieved     PT LONG TERM GOAL #5   Title FOTO functional outcome score improved from 50% limitation to 40% indicating improved function with less pain   Time 8   Period Weeks   Status On-going               Plan - 09/19/16 0843    Clinical Impression Statement Patient reports her pain is 30% better after cooking for 30 min.  Patient has increased cervical rotation.  Therapist dicussed with patient the importance of a walking program and howto exericse at work to break up the day.  Therapist discussed with pateint the importance of good posture due to haing osteopenia. Patient will benefit from skilled therapy to improve core strength and reduce pain.    Rehab Potential Good   Clinical Impairments Affecting Rehab Potential osteopenia    PT Frequency 2x / week   PT Duration 8 weeks   PT Treatment/Interventions ADLs/Self Care Home Management;Iontophoresis 76m/ml Dexamethasone;Cryotherapy;Electrical Stimulation;Moist Heat;Ultrasound;Therapeutic exercise;Neuromuscular re-education;Patient/family education;Manual techniques;Dry needling;Taping   PT Next Visit Plan decide if patient is  to continue   PT Home Exercise Plan progress as needed  Consulted and Agree with Plan of Care Patient      Patient will benefit from skilled therapeutic intervention in order to improve the following deficits and impairments:  Decreased range of motion, Decreased strength, Pain, Postural dysfunction, Impaired UE functional use  Visit Diagnosis: Pain in thoracic spine  Muscle weakness (generalized)  Abnormal posture     Problem List Patient Active Problem List   Diagnosis Date Noted  . Gallbladder polyp 05/20/2016  . Fatty liver 05/20/2016  . Elevated LFTs 05/02/2016  . Obesity 05/01/2016  . Abdominal discomfort 05/01/2016  . Proteinuria 03/06/2015  . Unspecified vitamin D deficiency 07/14/2013  . LOW BACK PAIN SYNDROME 01/23/2010  . Status post radioactive iodine thyroid ablation 08/17/2009  . Hyperlipidemia 07/26/2008  . ANEMIA-NOS 07/26/2008  . Essential hypertension 07/26/2008  . Pain in joint of left shoulder 07/26/2008  . Goiter 07/14/2007    Earlie Counts, PT 09/19/16 8:48 AM   [facility] [address] [place], [name], [address] Phone: [phone]   Fax:  [phone]  Name: Joan [name] MRN: [id number] Date of Birth: 1956-02-04

## 2016-09-25 ENCOUNTER — Encounter: Payer: Self-pay | Admitting: Physical Therapy

## 2016-09-25 ENCOUNTER — Ambulatory Visit: Payer: 59 | Attending: Internal Medicine | Admitting: Physical Therapy

## 2016-09-25 DIAGNOSIS — M6281 Muscle weakness (generalized): Secondary | ICD-10-CM | POA: Insufficient documentation

## 2016-09-25 DIAGNOSIS — R293 Abnormal posture: Secondary | ICD-10-CM | POA: Diagnosis not present

## 2016-09-25 DIAGNOSIS — M546 Pain in thoracic spine: Secondary | ICD-10-CM | POA: Diagnosis not present

## 2016-09-25 NOTE — Therapy (Addendum)
[facility] [address] [place], [name], [address] Phone: [phone]   Fax:  [phone]  Physical Therapy Treatment  Patient Details  Name: Joan [name] MRN: [id number] Date of Birth: Jul 17, 1956 Referring Provider: Dr. Quay Burow  Encounter Date: 09/25/2016      PT End of Session - 09/25/16 0858    Visit Number 21   Date for PT Re-Evaluation 09/26/16   PT Start Time 0852   PT Stop Time 0930   PT Time Calculation (min) 38 min   Activity Tolerance Patient tolerated treatment well   Behavior During Therapy [facility] for tasks assessed/performed      Past Medical History:  Diagnosis Date  . Anemia    due to Dysmenorrhea  . Hypertension    initially after BCP initiated   . Thyroid disease 2005 & 2009   nodule    Past Surgical History:  Procedure Laterality Date  . ABDOMINAL HYSTERECTOMY  1998   for heavy menses and fibroids  . BIOPSY THYROID  2005,2009   both negative  . COLONOSCOPY  2009    Dr Annette [facility]  . EXPLORATORY LAPAROTOMY  1985   Ovarian cyst  . [place]    LLQ pain due to ovarian cyst  . TONSILLECTOMY      There were no vitals filed for this visit.      Subjective Assessment - 09/25/16 0857    Subjective Feeling achy all over today. Reports pain is about the same.    Pertinent History hx of hip and shoulder bursitis;  osteopenia   Limitations Standing;Sitting   How long can you sit comfortably? 1 hour   How long can you stand comfortably? 30 min   Diagnostic tests U/S right side showed fatty liver;  bone density shows osteopenia   Patient Stated Goals I want to lose 40#   Currently in Pain? Yes   Pain Score 4    Pain Location Thoracic   Pain Orientation Mid   Pain Descriptors / Indicators Aching   Pain Type Chronic pain   Pain Onset More than a month ago   Pain Frequency Intermittent            [facility] PT Assessment - 09/25/16 0001      Assessment    Medical Diagnosis back pain (mid)   Referring Provider Dr. Kathe Becton Dominance Right   Next MD Visit August   Prior Therapy 2 years ago here at [facility]     Precautions   Precautions None  osteopenia     Restrictions   Weight Bearing Restrictions No     Balance Screen   Has the patient fallen in the past 6 months No   Has the patient had a decrease in activity level because of a fear of falling?  No   Is the patient reluctant to leave their home because of a fear of falling?  No     Home Environment   Living Environment Private residence   Living Arrangements Spouse/significant other   Available Help at Discharge [place] to enter   Entrance Stairs-Number of Steps 2   [place] One level     Prior Function   Vocation Full time employment     Observation/Other Assessments   Focus on Therapeutic Outcomes (FOTO)  48%     AROM   Right Shoulder Flexion 150 Degrees   Right Shoulder ABduction 150 Degrees  Right Shoulder External Rotation 80 Degrees   Left Shoulder Flexion 150 Degrees   Left Shoulder ABduction 130 Degrees   Left Shoulder External Rotation 80 Degrees   Cervical - Right Rotation 45   Cervical - Left Rotation 60     Strength   Cervical Flexion 4-/5   Cervical Extension 4/5   Lumbar Flexion 4-/5   Lumbar Extension 4-/5   Thoracic Flexion 4-/5   Thoracic Extension 4-/5                     [facility] Adult PT Treatment/Exercise - 09/25/16 0001      Lumbar Exercises: Supine   Ab Set 10 reps   Isometric Hip Flexion 10 reps     Knee/Hip Exercises: Aerobic   Elliptical level 1 forward 2 min and backward 2 min     Shoulder Exercises: Standing   Horizontal ABduction Strengthening;Both;20 reps;Theraband   Theraband Level (Shoulder Horizontal ABduction) Level 2 (Red)   Extension Strengthening;Both;20 reps  #15   Row Strengthening;Both;20 reps  #15   Retraction Strengthening;Both;20 reps  #10     Shoulder Exercises: Stretch    Other Shoulder Stretches AAROM cane stretching  flexion, extension, internal rotation                  PT Short Term Goals - 09/25/16 0900      PT SHORT TERM GOAL #1   Title The patient will demonstrate improved postural correction and safe body mechanics related to osteopenia   06/28/16   Time 4   Period Weeks   Status Achieved     PT SHORT TERM GOAL #2   Title The patient will have improved cervical rotation and thoracic rotation to 35 degrees needed for driving     Time 4   Period Weeks   Status Achieved     PT SHORT TERM GOAL #3   Title The patient will report a 25% improvement in pain with standing to cook   Time 4   Period Weeks   Status Achieved     PT SHORT TERM GOAL #4   Title Patient will have improved thoracic extension to 5 degrees needed to decrease vertebral body compression and improve UE ovehead use   Time 4   Period Weeks   Status Achieved           PT Long Term Goals - 09/25/16 0901      PT LONG TERM GOAL #1   Title The patient will be independent in safe self progression of HEP needed for further improvements in pain, strength, ROM and posture   09/26/16   Time --   Period --   Status Achieved     PT LONG TERM GOAL #2   Title The patient will report a 60% improvement in pain with standing to cook > 30 min   Time --   Period --   Status Partially Met     PT LONG TERM GOAL #3   Title The patient will have improved core and scapular muscle strength to grossly 4/5 needed to stand for > 40 min   Time --   Period --   Status Partially Met     PT LONG TERM GOAL #4   Title Cervical  rotation improved to 40 degrees needed for driving   Time --   Period --   Status Achieved     PT LONG TERM GOAL #5   Title FOTO functional outcome score  improved from 50% limitation to 40% indicating improved function with less pain   Time --   Period --   Status Partially Met  48%               Plan - 09/25/16 0924    Clinical Impression  Statement Pt has made progress towards short term and long term goals. Has improved pain with ADL's and improved cervical and shoulder ROM Bil. Pt is able to continue to progress at home with home exercise program. Pt has access to a gym and exeercise equipment and is motivated to continue independently.    Rehab Potential --   Clinical Impairments Affecting Rehab Potential osteopenia    PT Frequency --   PT Duration --   PT Next Visit Plan D/C PT to HEP   Consulted and Agree with Plan of Care Patient      Patient will benefit from skilled therapeutic intervention in order to improve the following deficits and impairments:     Visit Diagnosis: Pain in thoracic spine  Muscle weakness (generalized)  Abnormal posture     Problem List Patient Active Problem List   Diagnosis Date Noted  . Gallbladder polyp 05/20/2016  . Fatty liver 05/20/2016  . Elevated LFTs 05/02/2016  . Obesity 05/01/2016  . Abdominal discomfort 05/01/2016  . Proteinuria 03/06/2015  . Unspecified vitamin D deficiency 07/14/2013  . LOW BACK PAIN SYNDROME 01/23/2010  . Status post radioactive iodine thyroid ablation 08/17/2009  . Hyperlipidemia 07/26/2008  . ANEMIA-NOS 07/26/2008  . Essential hypertension 07/26/2008  . Pain in joint of left shoulder 07/26/2008  . Goiter 07/14/2007       PHYSICAL THERAPY DISCHARGE SUMMARY  Visits from Start of Care: 21  Current functional level related to goals / functional outcomes: See above for final stauts   Remaining deficits: See above   Education / Equipment: HEP, posture/body mechanics Plan: Patient agrees to discharge.  Patient goals were partially met. Patient is being discharged due to being pleased with the current functional level.  ?????  Sigurd Sos, PT 09/25/16 10:58 AM   Mikle Bosworth, PTA 09/25/16 11:00 AM    [facility] [address] [place], [name], [address] Phone:  [phone]   Fax:  [phone]  Name: Joan [name] MRN: [id number] Date of Birth: 1956-02-18

## 2016-09-26 ENCOUNTER — Encounter: Payer: 59 | Admitting: Physical Therapy

## 2017-01-13 DIAGNOSIS — B078 Other viral warts: Secondary | ICD-10-CM | POA: Diagnosis not present

## 2017-01-28 ENCOUNTER — Other Ambulatory Visit: Payer: Self-pay | Admitting: Gynecology

## 2017-01-28 DIAGNOSIS — Z1231 Encounter for screening mammogram for malignant neoplasm of breast: Secondary | ICD-10-CM

## 2017-02-17 ENCOUNTER — Ambulatory Visit
Admission: RE | Admit: 2017-02-17 | Discharge: 2017-02-17 | Disposition: A | Discharge status: Home / Self Care | Payer: 59 | Source: Ambulatory Visit | Attending: Gynecology | Admitting: Gynecology

## 2017-02-17 DIAGNOSIS — Z1231 Encounter for screening mammogram for malignant neoplasm of breast: Secondary | ICD-10-CM | POA: Diagnosis not present

## 2017-03-19 DIAGNOSIS — Z130 Encounter for screening for diseases of the blood and blood-forming organs and certain disorders involving the immune mechanism: Secondary | ICD-10-CM | POA: Diagnosis not present

## 2017-03-19 DIAGNOSIS — Z124 Encounter for screening for malignant neoplasm of cervix: Secondary | ICD-10-CM | POA: Diagnosis not present

## 2017-03-19 DIAGNOSIS — Z6834 Body mass index (BMI) 34.0-34.9, adult: Secondary | ICD-10-CM | POA: Diagnosis not present

## 2017-03-19 DIAGNOSIS — Z01419 Encounter for gynecological examination (general) (routine) without abnormal findings: Secondary | ICD-10-CM | POA: Diagnosis not present

## 2017-03-19 DIAGNOSIS — Z1151 Encounter for screening for human papillomavirus (HPV): Secondary | ICD-10-CM | POA: Diagnosis not present

## 2017-03-19 DIAGNOSIS — Z780 Asymptomatic menopausal state: Secondary | ICD-10-CM | POA: Diagnosis not present

## 2017-03-19 DIAGNOSIS — Z1389 Encounter for screening for other disorder: Secondary | ICD-10-CM | POA: Diagnosis not present

## 2017-05-21 DIAGNOSIS — E7800 Pure hypercholesterolemia, unspecified: Secondary | ICD-10-CM | POA: Diagnosis not present

## 2017-05-21 DIAGNOSIS — E890 Postprocedural hypothyroidism: Secondary | ICD-10-CM | POA: Diagnosis not present

## 2017-05-21 DIAGNOSIS — E559 Vitamin D deficiency, unspecified: Secondary | ICD-10-CM | POA: Diagnosis not present

## 2017-05-21 DIAGNOSIS — R7301 Impaired fasting glucose: Secondary | ICD-10-CM | POA: Diagnosis not present

## 2017-05-21 DIAGNOSIS — K7689 Other specified diseases of liver: Secondary | ICD-10-CM | POA: Diagnosis not present

## 2017-05-21 DIAGNOSIS — I10 Essential (primary) hypertension: Secondary | ICD-10-CM | POA: Diagnosis not present

## 2017-05-21 LAB — HEPATIC FUNCTION PANEL
ALK PHOS: 85 (ref 25–125)
ALT: 51 — AB (ref 7–35)
AST: 29 (ref 13–35)
BILIRUBIN DIRECT: 0.1 (ref 0.01–0.4)
BILIRUBIN, TOTAL: 0.4

## 2017-05-21 LAB — BASIC METABOLIC PANEL
BUN: 14 (ref 4–21)
CREATININE: 0.7 (ref 0.5–1.1)
Glucose: 95
Potassium: 3.9 (ref 3.4–5.3)
Sodium: 141 (ref 137–147)

## 2017-05-21 LAB — LIPID PANEL
Cholesterol: 235 — AB (ref 0–200)
HDL: 49 (ref 35–70)
LDL CALC: 158
TRIGLYCERIDES: 138 (ref 40–160)

## 2017-05-21 LAB — TSH: TSH: 1.8 (ref 0.41–5.90)

## 2017-06-16 ENCOUNTER — Telehealth: Payer: Self-pay | Admitting: Internal Medicine

## 2017-06-16 NOTE — Telephone Encounter (Signed)
Spoke with pt to inform that Dr Quay Burow is out of the office and blood work would be looked at once she returned.

## 2017-06-16 NOTE — Telephone Encounter (Signed)
Dr. Dolphus Jenny office with [facility] was faxing over some lab work for Dr. Quay Burow to go over with the Pt and the Pt called asking if we have received this yet.

## 2017-06-30 ENCOUNTER — Encounter: Payer: Self-pay | Admitting: Internal Medicine

## 2017-06-30 DIAGNOSIS — E039 Hypothyroidism, unspecified: Secondary | ICD-10-CM | POA: Insufficient documentation

## 2017-06-30 NOTE — Assessment & Plan Note (Addendum)
[facility] - no longer taking - concerned about side effects Cholesterol elevated Work on weight loss Lot fat/chol diet and exercise

## 2017-06-30 NOTE — Patient Instructions (Addendum)
All other Health Maintenance issues reviewed.   All recommended immunizations and age-appropriate screenings are up-to-date or discussed.  No immunizations administered today.   Medications reviewed and updated.  No changes recommended at this time.  Your prescription(s) have been submitted to your pharmacy. Please take as directed and contact our office if you believe you are having problem(s) with the medication(s).   Please followup in one year   Health Maintenance, Female Adopting a healthy lifestyle and getting preventive care can go a long way to promote health and wellness. Talk with your health care provider about what schedule of regular examinations is right for you. This is a good chance for you to check in with your provider about disease prevention and staying healthy. In between checkups, there are plenty of things you can do on your own. Experts have done a lot of research about which lifestyle changes and preventive measures are most likely to keep you healthy. Ask your health care provider for more information. Weight and diet Eat a healthy diet  Be sure to include plenty of vegetables, fruits, low-fat dairy products, and lean protein.  Do not eat a lot of foods high in solid fats, added sugars, or salt.  Get regular exercise. This is one of the most important things you can do for your health. ? Most adults should exercise for at least 150 minutes each week. The exercise should increase your heart rate and make you sweat (moderate-intensity exercise). ? Most adults should also do strengthening exercises at least twice a week. This is in addition to the moderate-intensity exercise.  Maintain a healthy weight  Body mass index (BMI) is a measurement that can be used to identify possible weight problems. It estimates body fat based on height and weight. Your health care provider can help determine your BMI and help you achieve or maintain a healthy weight.  For females  59 years of age and older: ? A BMI below 18.5 is considered underweight. ? A BMI of 18.5 to 24.9 is normal. ? A BMI of 25 to 29.9 is considered overweight. ? A BMI of 30 and above is considered obese.  Watch levels of cholesterol and blood lipids  You should start having your blood tested for lipids and cholesterol at 63 years of age, then have this test every 5 years.  You may need to have your cholesterol levels checked more often if: ? Your lipid or cholesterol levels are high. ? You are older than 63 years of age. ? You are at high risk for heart disease.  Cancer screening Lung Cancer  Lung cancer screening is recommended for adults 52-20 years old who are at high risk for lung cancer because of a history of smoking.  A yearly low-dose CT scan of the lungs is recommended for people who: ? Currently smoke. ? Have quit within the past 15 years. ? Have at least a 30-pack-year history of smoking. A pack year is smoking an average of one pack of cigarettes a day for 1 year.  Yearly screening should continue until it has been 15 years since you quit.  Yearly screening should stop if you develop a health problem that would prevent you from having lung cancer treatment.  Breast Cancer  Practice breast self-awareness. This means understanding how your breasts normally appear and feel.  It also means doing regular breast self-exams. Let your health care provider know about any changes, no matter how small.  If you are in your 32s  or 72s, you should have a clinical breast exam (CBE) by a health care provider every 1-3 years as part of a regular health exam.  If you are 68 or older, have a CBE every year. Also consider having a breast X-ray (mammogram) every year.  If you have a family history of breast cancer, talk to your health care provider about genetic screening.  If you are at high risk for breast cancer, talk to your health care provider about having an MRI and a mammogram  every year.  Breast cancer gene (BRCA) assessment is recommended for women who have family members with BRCA-related cancers. BRCA-related cancers include: ? Breast. ? Ovarian. ? Tubal. ? Peritoneal cancers.  Results of the assessment will determine the need for genetic counseling and BRCA1 and BRCA2 testing.  Cervical Cancer Your health care provider may recommend that you be screened regularly for cancer of the pelvic organs (ovaries, uterus, and vagina). This screening involves a pelvic examination, including checking for microscopic changes to the surface of your cervix (Pap test). You may be encouraged to have this screening done every 3 years, beginning at age 40.  For women ages 36-65, health care providers may recommend pelvic exams and Pap testing every 3 years, or they may recommend the Pap and pelvic exam, combined with testing for human papilloma virus (HPV), every 5 years. Some types of HPV increase your risk of cervical cancer. Testing for HPV may also be done on women of any age with unclear Pap test results.  Other health care providers may not recommend any screening for nonpregnant women who are considered low risk for pelvic cancer and who do not have symptoms. Ask your health care provider if a screening pelvic exam is right for you.  If you have had past treatment for cervical cancer or a condition that could lead to cancer, you need Pap tests and screening for cancer for at least 20 years after your treatment. If Pap tests have been discontinued, your risk factors (such as having a new sexual partner) need to be reassessed to determine if screening should resume. Some women have medical problems that increase the chance of getting cervical cancer. In these cases, your health care provider may recommend more frequent screening and Pap tests.  Colorectal Cancer  This type of cancer can be detected and often prevented.  Routine colorectal cancer screening usually begins at  63 years of age and continues through 63 years of age.  Your health care provider may recommend screening at an earlier age if you have risk factors for colon cancer.  Your health care provider may also recommend using home test kits to check for hidden blood in the stool.  A small camera at the end of a tube can be used to examine your colon directly (sigmoidoscopy or colonoscopy). This is done to check for the earliest forms of colorectal cancer.  Routine screening usually begins at age 79.  Direct examination of the colon should be repeated every 5-10 years through 63 years of age. However, you may need to be screened more often if early forms of precancerous polyps or small growths are found.  Skin Cancer  Check your skin from head to toe regularly.  Tell your health care provider about any new moles or changes in moles, especially if there is a change in a mole's shape or color.  Also tell your health care provider if you have a mole that is larger than the size of a  pencil eraser.  Always use sunscreen. Apply sunscreen liberally and repeatedly throughout the day.  Protect yourself by wearing long sleeves, pants, a wide-brimmed hat, and sunglasses whenever you are outside.  Heart disease, diabetes, and high blood pressure  High blood pressure causes heart disease and increases the risk of stroke. High blood pressure is more likely to develop in: ? People who have blood pressure in the high end of the normal range (130-139/85-89 mm Hg). ? People who are overweight or obese. ? People who are African American.  If you are 67-37 years of age, have your blood pressure checked every 3-5 years. If you are 25 years of age or older, have your blood pressure checked every year. You should have your blood pressure measured twice-once when you are at a hospital or clinic, and once when you are not at a hospital or clinic. Record the average of the two measurements. To check your blood  pressure when you are not at a hospital or clinic, you can use: ? An automated blood pressure machine at a pharmacy. ? A home blood pressure monitor.  If you are between 35 years and 38 years old, ask your health care provider if you should take aspirin to prevent strokes.  Have regular diabetes screenings. This involves taking a blood sample to check your fasting blood sugar level. ? If you are at a normal weight and have a low risk for diabetes, have this test once every three years after 63 years of age. ? If you are overweight and have a high risk for diabetes, consider being tested at a younger age or more often. Preventing infection Hepatitis B  If you have a higher risk for hepatitis B, you should be screened for this virus. You are considered at high risk for hepatitis B if: ? You were born in a country where hepatitis B is common. Ask your health care provider which countries are considered high risk. ? Your parents were born in a high-risk country, and you have not been immunized against hepatitis B (hepatitis B vaccine). ? You have HIV or AIDS. ? You use needles to inject street drugs. ? You live with someone who has hepatitis B. ? You have had sex with someone who has hepatitis B. ? You get hemodialysis treatment. ? You take certain medicines for conditions, including cancer, organ transplantation, and autoimmune conditions.  Hepatitis C  Blood testing is recommended for: ? Everyone born from [birth year] through 1967. ? Anyone with known risk factors for hepatitis C.  Sexually transmitted infections (STIs)  You should be screened for sexually transmitted infections (STIs) including gonorrhea and chlamydia if: ? You are sexually active and are younger than 63 years of age. ? You are older than 63 years of age and your health care provider tells you that you are at risk for this type of infection. ? Your sexual activity has changed since you were last screened and you are at an  increased risk for chlamydia or gonorrhea. Ask your health care provider if you are at risk.  If you do not have HIV, but are at risk, it may be recommended that you take a prescription medicine daily to prevent HIV infection. This is called pre-exposure prophylaxis (PrEP). You are considered at risk if: ? You are sexually active and do not regularly use condoms or know the HIV status of your partner(s). ? You take drugs by injection. ? You are sexually active with a partner who has HIV.  Talk with your health care provider about whether you are at high risk of being infected with HIV. If you choose to begin PrEP, you should first be tested for HIV. You should then be tested every 3 months for as long as you are taking PrEP. Pregnancy  If you are premenopausal and you may become pregnant, ask your health care provider about preconception counseling.  If you may become pregnant, take 400 to 800 micrograms (mcg) of folic acid every day.  If you want to prevent pregnancy, talk to your health care provider about birth control (contraception). Osteoporosis and menopause  Osteoporosis is a disease in which the bones lose minerals and strength with aging. This can result in serious bone fractures. Your risk for osteoporosis can be identified using a bone density scan.  If you are 82 years of age or older, or if you are at risk for osteoporosis and fractures, ask your health care provider if you should be screened.  Ask your health care provider whether you should take a calcium or vitamin D supplement to lower your risk for osteoporosis.  Menopause may have certain physical symptoms and risks.  Hormone replacement therapy may reduce some of these symptoms and risks. Talk to your health care provider about whether hormone replacement therapy is right for you. Follow these instructions at home:  Schedule regular health, dental, and eye exams.  Stay current with your immunizations.  Do not use  any tobacco products including cigarettes, chewing tobacco, or electronic cigarettes.  If you are pregnant, do not drink alcohol.  If you are breastfeeding, limit how much and how often you drink alcohol.  Limit alcohol intake to no more than 1 drink per day for nonpregnant women. One drink equals 12 ounces of beer, 5 ounces of wine, or 1 ounces of hard liquor.  Do not use street drugs.  Do not share needles.  Ask your health care provider for help if you need support or information about quitting drugs.  Tell your health care provider if you often feel depressed.  Tell your health care provider if you have ever been abused or do not feel safe at home. This information is not intended to replace advice given to you by your health care provider. Make sure you discuss any questions you have with your health care provider. Document Released: 05/27/2011 Document Revised: 04/18/2016 Document Reviewed: 08/15/2015 Elsevier Interactive Patient Education  [employer].

## 2017-06-30 NOTE — Progress Notes (Signed)
Subjective:    Patient ID: Joan [name], female    DOB: Mar 24, 1956, 61 y.o.   MRN: [id number]  HPI She is here for a physical exam.     She denies any changes in her history.  She had blood work done two months through Dr Chalmers Cater.  She has no concerns.    Medications and allergies reviewed with patient and updated if appropriate.  Patient Active Problem List   Diagnosis Date Noted  . Hypothyroidism 06/30/2017  . Gallbladder polyp 05/20/2016  . Fatty liver 05/20/2016  . Obesity 05/01/2016  . Abdominal discomfort 05/01/2016  . Proteinuria 03/06/2015  . Unspecified vitamin D deficiency 07/14/2013  . LOW BACK PAIN SYNDROME 01/23/2010  . Status post radioactive iodine thyroid ablation 08/17/2009  . Hyperlipidemia 07/26/2008  . ANEMIA-NOS 07/26/2008  . Essential hypertension 07/26/2008  . Pain in joint of left shoulder 07/26/2008  . Goiter 07/14/2007    Current Outpatient Prescriptions on File Prior to Visit  Medication Sig Dispense Refill  . amLODipine-benazepril (LOTREL) 5-20 MG capsule TAKE 1 TABLET BY MOUTH ONCE DAILY 90 capsule 3  . Cholecalciferol (VITAMIN D3) 2000 UNITS TABS Take by mouth daily.    Marland Kitchen estradiol (VIVELLE-DOT) 0.025 MG/24HR Place 1 patch onto the skin 2 (two) times a week.      . levothyroxine (SYNTHROID, LEVOTHROID) 100 MCG tablet Take 100 mcg by mouth daily before breakfast.    . triamterene-hydrochlorothiazide (DYAZIDE) 37.5-25 MG capsule TAKE 1 CAPSULE BY MOUTH ONCE DAILY 90 capsule 3  . Vitamin D, Ergocalciferol, (DRISDOL) 50000 UNITS CAPS capsule Take 50,000 Units by mouth every 7 (seven) days.     No current facility-administered medications on file prior to visit.     Past Medical History:  Diagnosis Date  . Anemia    due to Dysmenorrhea  . Hypertension    initially after BCP initiated   . Thyroid disease 2005 & 2009   nodule    Past Surgical History:  Procedure Laterality Date  . ABDOMINAL HYSTERECTOMY  1998   for heavy menses and  fibroids  . BIOPSY THYROID  2005,2009   both negative  . COLONOSCOPY  2009    Dr Annet[facility]  . EXPLORATORY LAPAROTOMY  1985   Ovarian cyst  . [place]    LLQ pain due to ovarian cyst  . TONSILLECTOMY      Social History   Social History  . Marital status: Married    Spouse name: N/A  . Number of children: N/A  . Years of education: N/A   Social History Main Topics  . Smoking status: Never Smoker  . Smokeless tobacco: Never Used  . Alcohol use No  . Drug use: No  . Sexual activity: Not Currently   Other Topics Concern  . None   Social History Narrative  . None    Family History  Problem Relation Age of Onset  . Hypertension Father   . Kidney failure Father        Bright's Disease; died @ 74  . Hypertension Mother   . Hyperlipidemia Mother   . Osteoarthritis Mother   . Hypertension Brother   . Diabetes Brother   . Stroke Maternal Grandmother        in 91s  . Hypertension Maternal Aunt        also DJD/ OA  . Breast cancer Maternal Aunt   . Heart attack Maternal Grandfather 69       also  CVA  .  Heart block Paternal Grandmother        pacemaker    Review of Systems  Constitutional: Negative for chills and fever.  Eyes: Negative for visual disturbance.  Respiratory: Negative for cough, shortness of breath and wheezing.        Snores  Cardiovascular: Negative for chest pain, palpitations and leg swelling.  Gastrointestinal: Negative for abdominal pain, blood in stool, constipation, diarrhea and nausea.       Occ GERD  Genitourinary: Negative for dysuria and hematuria.  Musculoskeletal: Positive for back pain (thoracic region when standing long periods).  Skin: Negative for color change and rash.  Neurological: Negative for light-headedness and headaches.  Psychiatric/Behavioral: Negative for dysphoric mood. The patient is not nervous/anxious.        Objective:   Vitals:   07/01/17 1517  BP: 114/82  Pulse: 95  Resp: 16    Temp: 98.2 F (36.8 C)   Filed Weights   07/01/17 1517  Weight: 199 lb (90.3 kg)   Body mass index is 34.16 kg/m.  Wt Readings from Last 3 Encounters:  07/01/17 199 lb (90.3 kg)  07/08/16 204 lb (92.5 kg)  05/15/16 199 lb (90.3 kg)     Physical Exam Constitutional: She appears well-developed and well-nourished. No distress.  HENT:  Head: Normocephalic and atraumatic.  Right Ear: External ear normal. Normal ear canal and TM Left Ear: External ear normal.  Normal ear canal and TM Mouth/Throat: Oropharynx is clear and moist.  Eyes: Conjunctivae and EOM are normal.  Neck: Neck supple. No tracheal deviation present. No thyromegaly present.  No carotid bruit  Cardiovascular: Normal rate, regular rhythm and normal heart sounds.   No murmur heard.  No edema. Pulmonary/Chest: Effort normal and breath sounds normal. No respiratory distress. She has no wheezes. She has no rales.  Breast: deferred to Gyn Abdominal: Soft. Obese.  She exhibits no distension. There is no tenderness.  Lymphadenopathy: She has no cervical adenopathy.  Skin: Skin is warm and dry. She is not diaphoretic.  Psychiatric: She has a normal mood and affect. Her behavior is normal.         Assessment & Plan:   Physical exam: Screening blood work  - done by Dr Chalmers Cater - reviewed Immunizations   Up to date, except shingrix - discussed Colonoscopy    - due 2019 Mammogram    Up to date  Gyn     Up to date  Dexa   Up to date - due 2019 Eye exams  Up to date  EKG   Last EKG 2013 - will repeat today - EKG shows: Sinus rhythm 86 bpm, normal EKG, no changed compared to prior from 2013 Exercise  Stressed regular exercise Weight  - obese - stressed weight loss Skin    no concerns, sees derm Substance abuse    none  See Problem List for Assessment and Plan of chronic medical problems.   FU annually

## 2017-06-30 NOTE — Assessment & Plan Note (Signed)
Management per Dr. Balan 

## 2017-07-01 ENCOUNTER — Ambulatory Visit (INDEPENDENT_AMBULATORY_CARE_PROVIDER_SITE_OTHER): Payer: 59 | Admitting: Internal Medicine

## 2017-07-01 ENCOUNTER — Encounter: Payer: Self-pay | Admitting: Internal Medicine

## 2017-07-01 VITALS — BP 114/82 | HR 95 | Temp 98.2°F | Resp 16 | Wt 199.0 lb

## 2017-07-01 DIAGNOSIS — K760 Fatty (change of) liver, not elsewhere classified: Secondary | ICD-10-CM

## 2017-07-01 DIAGNOSIS — I10 Essential (primary) hypertension: Secondary | ICD-10-CM | POA: Diagnosis not present

## 2017-07-01 DIAGNOSIS — Z0000 Encounter for general adult medical examination without abnormal findings: Secondary | ICD-10-CM

## 2017-07-01 DIAGNOSIS — E890 Postprocedural hypothyroidism: Secondary | ICD-10-CM

## 2017-07-01 DIAGNOSIS — E049 Nontoxic goiter, unspecified: Secondary | ICD-10-CM | POA: Diagnosis not present

## 2017-07-01 DIAGNOSIS — E7800 Pure hypercholesterolemia, unspecified: Secondary | ICD-10-CM | POA: Diagnosis not present

## 2017-07-01 MED ORDER — AMLODIPINE BESY-BENAZEPRIL HCL 5-20 MG PO CAPS: ORAL_CAPSULE | ORAL | 3 refills | Status: DC

## 2017-07-01 MED ORDER — TRIAMTERENE-HCTZ 37.5-25 MG PO CAPS: 1.0000 | ORAL_CAPSULE | Freq: Every day | ORAL | 3 refills | Status: DC

## 2017-07-01 NOTE — Assessment & Plan Note (Signed)
Advised weight loss.

## 2017-07-01 NOTE — Assessment & Plan Note (Signed)
BP Readings from Last 3 Encounters:  07/01/17 114/82  07/08/16 130/82  05/15/16 114/72    BP well controlled Current regimen effective and well tolerated Continue current medications at current doses

## 2017-07-08 ENCOUNTER — Encounter: Payer: Self-pay | Admitting: Internal Medicine

## 2017-09-09 DIAGNOSIS — M9902 Segmental and somatic dysfunction of thoracic region: Secondary | ICD-10-CM | POA: Diagnosis not present

## 2017-09-09 DIAGNOSIS — M9901 Segmental and somatic dysfunction of cervical region: Secondary | ICD-10-CM | POA: Diagnosis not present

## 2017-09-09 DIAGNOSIS — M50322 Other cervical disc degeneration at C5-C6 level: Secondary | ICD-10-CM | POA: Diagnosis not present

## 2017-09-11 DIAGNOSIS — M50322 Other cervical disc degeneration at C5-C6 level: Secondary | ICD-10-CM | POA: Diagnosis not present

## 2017-09-11 DIAGNOSIS — H524 Presbyopia: Secondary | ICD-10-CM | POA: Diagnosis not present

## 2017-09-11 DIAGNOSIS — M9901 Segmental and somatic dysfunction of cervical region: Secondary | ICD-10-CM | POA: Diagnosis not present

## 2017-09-11 DIAGNOSIS — M9902 Segmental and somatic dysfunction of thoracic region: Secondary | ICD-10-CM | POA: Diagnosis not present

## 2017-09-16 DIAGNOSIS — M9901 Segmental and somatic dysfunction of cervical region: Secondary | ICD-10-CM | POA: Diagnosis not present

## 2017-09-16 DIAGNOSIS — M50322 Other cervical disc degeneration at C5-C6 level: Secondary | ICD-10-CM | POA: Diagnosis not present

## 2017-09-16 DIAGNOSIS — M9902 Segmental and somatic dysfunction of thoracic region: Secondary | ICD-10-CM | POA: Diagnosis not present

## 2017-10-09 DIAGNOSIS — M9901 Segmental and somatic dysfunction of cervical region: Secondary | ICD-10-CM | POA: Diagnosis not present

## 2017-10-09 DIAGNOSIS — M50322 Other cervical disc degeneration at C5-C6 level: Secondary | ICD-10-CM | POA: Diagnosis not present

## 2017-10-09 DIAGNOSIS — M9902 Segmental and somatic dysfunction of thoracic region: Secondary | ICD-10-CM | POA: Diagnosis not present

## 2018-01-28 ENCOUNTER — Other Ambulatory Visit: Payer: Self-pay | Admitting: Gynecology

## 2018-01-28 DIAGNOSIS — Z1231 Encounter for screening mammogram for malignant neoplasm of breast: Secondary | ICD-10-CM

## 2018-02-05 ENCOUNTER — Encounter: Payer: Self-pay | Admitting: Internal Medicine

## 2018-02-18 ENCOUNTER — Ambulatory Visit
Admission: RE | Admit: 2018-02-18 | Discharge: 2018-02-18 | Disposition: A | Discharge status: Home / Self Care | Payer: 59 | Source: Ambulatory Visit | Attending: Gynecology | Admitting: Gynecology

## 2018-02-18 DIAGNOSIS — Z1231 Encounter for screening mammogram for malignant neoplasm of breast: Secondary | ICD-10-CM | POA: Diagnosis not present

## 2018-03-02 ENCOUNTER — Encounter: Payer: Self-pay | Admitting: Internal Medicine

## 2018-04-21 DIAGNOSIS — Z01419 Encounter for gynecological examination (general) (routine) without abnormal findings: Secondary | ICD-10-CM | POA: Diagnosis not present

## 2018-04-21 DIAGNOSIS — Z79890 Hormone replacement therapy (postmenopausal): Secondary | ICD-10-CM | POA: Diagnosis not present

## 2018-04-21 DIAGNOSIS — Z130 Encounter for screening for diseases of the blood and blood-forming organs and certain disorders involving the immune mechanism: Secondary | ICD-10-CM | POA: Diagnosis not present

## 2018-04-21 DIAGNOSIS — Z780 Asymptomatic menopausal state: Secondary | ICD-10-CM | POA: Diagnosis not present

## 2018-04-21 DIAGNOSIS — Z1389 Encounter for screening for other disorder: Secondary | ICD-10-CM | POA: Diagnosis not present

## 2018-04-21 DIAGNOSIS — R4582 Worries: Secondary | ICD-10-CM | POA: Diagnosis not present

## 2018-04-21 DIAGNOSIS — N841 Polyp of cervix uteri: Secondary | ICD-10-CM | POA: Diagnosis not present

## 2018-04-21 DIAGNOSIS — Z6834 Body mass index (BMI) 34.0-34.9, adult: Secondary | ICD-10-CM | POA: Diagnosis not present

## 2018-04-24 ENCOUNTER — Ambulatory Visit (AMBULATORY_SURGERY_CENTER): Payer: Self-pay | Admitting: *Deleted

## 2018-04-24 ENCOUNTER — Other Ambulatory Visit: Payer: Self-pay

## 2018-04-24 VITALS — Ht 64.5 in | Wt 205.0 lb

## 2018-04-24 DIAGNOSIS — Z1211 Encounter for screening for malignant neoplasm of colon: Secondary | ICD-10-CM

## 2018-04-24 MED ORDER — NA SULFATE-K SULFATE-MG SULF 17.5-3.13-1.6 GM/177ML PO SOLN: 1.0000 | Freq: Once | ORAL | 0 refills | Status: AC

## 2018-04-24 NOTE — Progress Notes (Signed)
No egg or soy allergy known to patient  No issues with past sedation with any surgeries  or procedures, no intubation problems  No diet pills per patient No home 02 use per patient  No blood thinners per patient  Pt denies issues with constipation  No A fib or A flutter  EMMI video sent to pt's e mail pt declined   

## 2018-04-30 ENCOUNTER — Encounter: Payer: Self-pay | Admitting: Internal Medicine

## 2018-05-08 ENCOUNTER — Ambulatory Visit (AMBULATORY_SURGERY_CENTER): Payer: 59 | Admitting: Internal Medicine

## 2018-05-08 ENCOUNTER — Encounter: Payer: Self-pay | Admitting: Internal Medicine

## 2018-05-08 ENCOUNTER — Other Ambulatory Visit: Payer: Self-pay

## 2018-05-08 VITALS — BP 121/80 | HR 73 | Temp 98.4°F | Resp 21 | Ht 64.0 in | Wt 199.0 lb

## 2018-05-08 DIAGNOSIS — Z1211 Encounter for screening for malignant neoplasm of colon: Secondary | ICD-10-CM

## 2018-05-08 DIAGNOSIS — D124 Benign neoplasm of descending colon: Secondary | ICD-10-CM | POA: Diagnosis not present

## 2018-05-08 DIAGNOSIS — E039 Hypothyroidism, unspecified: Secondary | ICD-10-CM | POA: Diagnosis not present

## 2018-05-08 DIAGNOSIS — E669 Obesity, unspecified: Secondary | ICD-10-CM | POA: Diagnosis not present

## 2018-05-08 DIAGNOSIS — I10 Essential (primary) hypertension: Secondary | ICD-10-CM | POA: Diagnosis not present

## 2018-05-08 MED ORDER — SODIUM CHLORIDE 0.9 % IV SOLN: 500.0000 mL | Freq: Once | INTRAVENOUS | Status: DC

## 2018-05-08 NOTE — Progress Notes (Signed)
Pt's states no medical or surgical changes since previsit or office visit. 

## 2018-05-08 NOTE — Patient Instructions (Signed)
Read handouts on polyps and hemorrhoids.      YOU HAD AN ENDOSCOPIC PROCEDURE TODAY AT [facility]:   Refer to the procedure report that was given to you for any specific questions about what was found during the examination.  If the procedure report does not answer your questions, please call your gastroenterologist to clarify.  If you requested that your care partner not be given the details of your procedure findings, then the procedure report has been included in a sealed envelope for you to review at your convenience later.  YOU SHOULD EXPECT: Some feelings of bloating in the abdomen. Passage of more gas than usual.  Walking can help get rid of the air that was put into your GI tract during the procedure and reduce the bloating. If you had a lower endoscopy (such as a colonoscopy or flexible sigmoidoscopy) you may notice spotting of blood in your stool or on the toilet paper. If you underwent a bowel prep for your procedure, you may not have a normal bowel movement for a few days.  Please Note:  You might notice some irritation and congestion in your nose or some drainage.  This is from the oxygen used during your procedure.  There is no need for concern and it should clear up in a day or so.  SYMPTOMS TO REPORT IMMEDIATELY:   Following lower endoscopy (colonoscopy or flexible sigmoidoscopy):  Excessive amounts of blood in the stool  Significant tenderness or worsening of abdominal pains  Swelling of the abdomen that is new, acute  Fever of 100F or higher   For urgent or emergent issues, a gastroenterologist can be reached at any hour by calling [phone].   DIET:  We do recommend a small meal at first, but then you may proceed to your regular diet.  Drink plenty of fluids but you should avoid alcoholic beverages for 24 hours.  ACTIVITY:  You should plan to take it easy for the rest of today and you should NOT DRIVE or use heavy machinery until tomorrow (because  of the sedation medicines used during the test).    FOLLOW UP: Our staff will call the number listed on your records the next business day following your procedure to check on you and address any questions or concerns that you may have regarding the information given to you following your procedure. If we do not reach you, we will leave a message.  However, if you are feeling well and you are not experiencing any problems, there is no need to return our call.  We will assume that you have returned to your regular daily activities without incident.  If any biopsies were taken you will be contacted by phone or by letter within the next 1-3 weeks.  Please call us at [phone] if you have not heard about the biopsies in 3 weeks.    SIGNATURES/CONFIDENTIALITY: You and/or your care partner have signed paperwork which will be entered into your electronic medical record.  These signatures attest to the fact that that the information above on your After Visit Summary has been reviewed and is understood.  Full responsibility of the confidentiality of this discharge information lies with you and/or your care-partner.

## 2018-05-08 NOTE — Progress Notes (Signed)
Called to room to assist during endoscopic procedure.  Patient ID and intended procedure confirmed with present staff. Received instructions for my participation in the procedure from the performing physician.  

## 2018-05-08 NOTE — Progress Notes (Signed)
To recovery, report to RN, VSS. 

## 2018-05-08 NOTE — Op Note (Signed)
[facility] Patient Name: Joan [name] Procedure Date: 05/08/2018 1:15 PM MRN: [id number] Endoscopist: Jerene Bears , MD Age: 62 Referring MD:  Date of Birth: [birth year] Gender: Female Account #: [id number] Procedure:                Colonoscopy Indications:              Screening for colorectal malignant neoplasm, Last                            colonoscopy 10 years ago Medicines:                Monitored Anesthesia Care Procedure:                Pre-Anesthesia Assessment:                           - Prior to the procedure, a History and Physical                            was performed, and patient medications and                            allergies were reviewed. The patient's tolerance of                            previous anesthesia was also reviewed. The risks                            and benefits of the procedure and the sedation                            options and risks were discussed with the patient.                            All questions were answered, and informed consent                            was obtained. Prior Anticoagulants: The patient has                            taken no previous anticoagulant or antiplatelet                            agents. ASA Grade Assessment: II - A patient with                            mild systemic disease. After reviewing the risks                            and benefits, the patient was deemed in                            satisfactory condition to undergo the procedure.  After obtaining informed consent, the colonoscope                            was passed under direct vision. Throughout the                            procedure, the patient's blood pressure, pulse, and                            oxygen saturations were monitored continuously. The                            Model PCF-[id number]) scope was introduced                            through the anus and advanced to  the cecum,                            identified by appendiceal orifice and ileocecal                            valve. The colonoscopy was performed without                            difficulty. The patient tolerated the procedure                            well. The quality of the bowel preparation was                            good. The ileocecal valve, appendiceal orifice, and                            rectum were photographed. Scope In: 1:29:42 PM Scope Out: 1:42:04 PM Scope Withdrawal Time: 0 hours 9 minutes 25 seconds  Total Procedure Duration: 0 hours 12 minutes 22 seconds  Findings:                 The digital rectal exam was normal.                           A 3 mm polyp was found in the descending colon. The                            polyp was sessile. The polyp was removed with a                            cold snare. Resection and retrieval were complete.                           Internal hemorrhoids and hypertrophied anal                            papillae were found during retroflexion. The  hemorrhoids were small.                           The exam was otherwise without abnormality. Complications:            No immediate complications. Estimated Blood Loss:     Estimated blood loss was minimal. Impression:               - One 3 mm polyp in the descending colon, removed                            with a cold snare. Resected and retrieved.                           - Small internal hemorrhoids.                           - The examination was otherwise normal. Recommendation:           - Patient has a contact number available for                            emergencies. The signs and symptoms of potential                            delayed complications were discussed with the                            patient. Return to normal activities tomorrow.                            Written discharge instructions were provided to the                             patient.                           - Resume previous diet.                           - Continue present medications.                           - Await pathology results.                           - Repeat colonoscopy is recommended. The                            colonoscopy date will be determined after pathology                            results from today's exam become available for                            review. Jerene Bears, MD 05/08/2018 1:45:42 PM This report has been signed electronically.

## 2018-05-11 ENCOUNTER — Telehealth: Payer: Self-pay

## 2018-05-11 ENCOUNTER — Telehealth: Payer: Self-pay | Admitting: *Deleted

## 2018-05-11 NOTE — Telephone Encounter (Signed)
  Follow up Call-  Call back number 05/08/2018  Post procedure Call Back phone  # [phone]  Permission to leave phone message Yes  Some recent data might be hidden     Patient questions:  Do you have a fever, pain , or abdominal swelling? No. Pain Score  0 *  Have you tolerated food without any problems? Yes.    Have you been able to return to your normal activities? Yes.    Do you have any questions about your discharge instructions: Diet   No. Medications  No. Follow up visit  No.  Do you have questions or concerns about your Care? No.  Actions: * If pain score is 4 or above: No action needed, pain <4.

## 2018-05-11 NOTE — Telephone Encounter (Signed)
  Follow up Call-  Call back number 05/08/2018  Post procedure Call Back phone  # [phone]  Permission to leave phone message Yes  Some recent data might be hidden     Patient questions:  Message left to call us if necessary.

## 2018-05-18 ENCOUNTER — Encounter: Payer: Self-pay | Admitting: Internal Medicine

## 2018-05-20 DIAGNOSIS — E890 Postprocedural hypothyroidism: Secondary | ICD-10-CM | POA: Diagnosis not present

## 2018-05-20 DIAGNOSIS — I10 Essential (primary) hypertension: Secondary | ICD-10-CM | POA: Diagnosis not present

## 2018-05-20 DIAGNOSIS — K7689 Other specified diseases of liver: Secondary | ICD-10-CM | POA: Diagnosis not present

## 2018-05-20 DIAGNOSIS — E559 Vitamin D deficiency, unspecified: Secondary | ICD-10-CM | POA: Diagnosis not present

## 2018-05-20 DIAGNOSIS — E7800 Pure hypercholesterolemia, unspecified: Secondary | ICD-10-CM | POA: Diagnosis not present

## 2018-05-20 DIAGNOSIS — R7301 Impaired fasting glucose: Secondary | ICD-10-CM | POA: Diagnosis not present

## 2018-05-20 DIAGNOSIS — E669 Obesity, unspecified: Secondary | ICD-10-CM | POA: Diagnosis not present

## 2018-06-01 ENCOUNTER — Encounter: Payer: Self-pay | Admitting: Internal Medicine

## 2018-06-01 ENCOUNTER — Ambulatory Visit: Payer: 59 | Admitting: Internal Medicine

## 2018-06-01 VITALS — BP 118/84 | HR 79 | Temp 98.1°F | Resp 16 | Wt 208.0 lb

## 2018-06-01 DIAGNOSIS — J069 Acute upper respiratory infection, unspecified: Secondary | ICD-10-CM | POA: Diagnosis not present

## 2018-06-01 MED ORDER — AMOXICILLIN-POT CLAVULANATE 875-125 MG PO TABS: 1.0000 | ORAL_TABLET | Freq: Two times a day (BID) | ORAL | 0 refills | Status: DC

## 2018-06-01 NOTE — Patient Instructions (Addendum)
Take the augmentin as prescribed.  Take over the counter cold medications as needed.  Try zyrtec instead of Claritin.   Call if no improvement

## 2018-06-01 NOTE — Progress Notes (Signed)
Subjective:    Patient ID: Joan [name], female    DOB: [birth year], 62 y.o.   MRN: [id number]  HPI She is here for an acute visit for cold symptoms.  Her symptoms started about two weeks ago.    She is experiencing sore throat, nasal congestion, PND, cough that is productive, wheeze and mild headaches.  She denies fever, chills, sinus pain, SOB, nausea and lightheadedness.    She has taken dimetap.    She does snore.  She does not have any witnessed apnea.  She wonders about aspiration.  She sleeps on two pillows.   Medications and allergies reviewed with patient and updated if appropriate.  Patient Active Problem List   Diagnosis Date Noted  . Hypothyroidism 06/30/2017  . Gallbladder polyp 05/20/2016  . Fatty liver 05/20/2016  . Obesity 05/01/2016  . Abdominal discomfort 05/01/2016  . Proteinuria 03/06/2015  . Unspecified vitamin D deficiency 07/14/2013  . LOW BACK PAIN SYNDROME 01/23/2010  . Status post radioactive iodine thyroid ablation 08/17/2009  . Hyperlipidemia 07/26/2008  . ANEMIA-NOS 07/26/2008  . Essential hypertension 07/26/2008  . Pain in joint of left shoulder 07/26/2008  . Goiter 07/14/2007    Current Outpatient Medications on File Prior to Visit  Medication Sig Dispense Refill  . amLODipine-benazepril (LOTREL) 5-20 MG capsule TAKE 1 TABLET BY MOUTH ONCE DAILY 90 capsule 3  . Cholecalciferol (VITAMIN D3) 2000 UNITS TABS Take by mouth daily.    Marland Kitchen estradiol (VIVELLE-DOT) 0.025 MG/24HR Place 1 patch onto the skin 2 (two) times a week.      . levothyroxine (SYNTHROID, LEVOTHROID) 100 MCG tablet Take 100 mcg by mouth daily before breakfast.    . Loratadine (CLARITIN PO) Take by mouth daily.    Marland Kitchen triamterene-hydrochlorothiazide (DYAZIDE) 37.5-25 MG capsule Take 1 each (1 capsule total) by mouth daily. 90 capsule 3  . Vitamin D, Ergocalciferol, (DRISDOL) 50000 UNITS CAPS capsule Take 50,000 Units by mouth every 7 (seven) days.     Current  Facility-Administered Medications on File Prior to Visit  Medication Dose Route Frequency Provider Last Rate Last Dose  . 0.9 %  sodium chloride infusion  500 mL Intravenous Once Pyrtle, Lajuan Lines, MD        Past Medical History:  Diagnosis Date  . Allergy   . Anemia    due to Dysmenorrhea  . Arthritis    generalized   . Hypertension    initially after BCP initiated   . Thyroid disease 2005 & 2009   nodule    Past Surgical History:  Procedure Laterality Date  . ABDOMINAL HYSTERECTOMY  1998   for heavy menses and fibroids  . BIOPSY THYROID  2005,2009   both negative  . CESAREAN SECTION     x2  . COLONOSCOPY  2009    Dr [facility]  . EXPLORATORY LAPAROTOMY  1985   Ovarian cyst  . FLEXIBLE SIGMOIDOSCOPY  1985    LLQ pain due to ovarian cyst  . SIGMOIDOSCOPY    . TONSILLECTOMY      Social History   Socioeconomic History  . Marital status: Married    Spouse name: Not on file  . Number of children: Not on file  . Years of education: Not on file  . Highest education level: Not on file  Occupational History  . Not on file  Social Needs  . Financial resource strain: Not on file  . Food insecurity:    Worry: Not on file  Inability: Not on file  . Transportation needs:    Medical: Not on file    Non-medical: Not on file  Tobacco Use  . Smoking status: Never Smoker  . Smokeless tobacco: Never Used  Substance and Sexual Activity  . Alcohol use: No  . Drug use: No  . Sexual activity: Not Currently  Lifestyle  . Physical activity:    Days per week: Not on file    Minutes per session: Not on file  . Stress: Not on file  Relationships  . Social connections:    Talks on phone: Not on file    Gets together: Not on file    Attends religious service: Not on file    Active member of club or organization: Not on file    Attends meetings of clubs or organizations: Not on file    Relationship status: Not on file  Other Topics Concern  . Not on file  Social  History Narrative   RN at [employer]      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       Family History  Problem Relation Age of Onset  . Hypertension Father   . Kidney failure Father        Bright's Disease; died @ 93  . Hypertension Mother   . Hyperlipidemia Mother   . Osteoarthritis Mother   . Hypertension Brother   . Diabetes Brother   . Stroke Maternal Grandmother        in 12s  . Hypertension Maternal Aunt        also DJD/ OA  . Breast cancer Maternal Aunt   . Heart attack Maternal Grandfather 69       also  CVA  . Heart block Paternal Grandmother        pacemaker  . Colon cancer Neg Hx   . Colon polyps Neg Hx   . Esophageal cancer Neg Hx   . Rectal cancer Neg Hx   . Stomach cancer Neg Hx     Review of Systems  Constitutional: Negative for chills and fever.  HENT: Positive for congestion (mild), postnasal drip and sore throat. Negative for ear pain (feel clogged), sinus pressure and sinus pain.   Respiratory: Positive for cough (productive) and wheezing. Negative for shortness of breath.   Gastrointestinal: Negative for nausea.  Neurological: Positive for headaches (mild, occ). Negative for light-headedness.       Objective:   Vitals:   06/01/18 1523  BP: 118/84  Pulse: 79  Resp: 16  Temp: 98.1 F (36.7 C)  SpO2: 95%   Filed Weights   06/01/18 1523  Weight: 208 lb (94.3 kg)   Body mass index is 35.7 kg/m.  Wt Readings from Last 3 Encounters:  06/01/18 208 lb (94.3 kg)  05/08/18 199 lb (90.3 kg)  04/24/18 205 lb (93 kg)     Physical Exam GENERAL APPEARANCE: Appears stated age, well appearing, NAD EYES: conjunctiva clear, no icterus HEENT: bilateral tympanic membranes and ear canals normal, oropharynx with mild erythema, no thyromegaly, trachea midline, no cervical or supraclavicular lymphadenopathy LUNGS: Clear to auscultation without wheeze or crackles, unlabored breathing, good air entry bilaterally CARDIOVASCULAR: Normal S1,S2 without murmurs, no  edema SKIN: warm, dry        Assessment & Plan:   See Problem List for Assessment and Plan of chronic medical problems.

## 2018-06-01 NOTE — Assessment & Plan Note (Signed)
given that symptoms have lasted two weeks will treat with an antibiotic - Likely bacterial  Start augmentin otc cold medications Rest, fluid Call if no improvement

## 2018-07-09 DIAGNOSIS — R739 Hyperglycemia, unspecified: Secondary | ICD-10-CM | POA: Insufficient documentation

## 2018-07-09 NOTE — Progress Notes (Signed)
Subjective:    Patient ID: Joan [name], female    DOB: [birth year], 62 y.o.   MRN: [id number]  HPI She is here for a physical exam.   She has no concerns.  She is working on weight loss - Dr Chalmers Cater prescribed Belviq and she thinks that has helped a little.  She is not exercising.    Medications and allergies reviewed with patient and updated if appropriate.  Patient Active Problem List   Diagnosis Date Noted  . Hyperglycemia 07/09/2018  . Hypothyroidism 06/30/2017  . Gallbladder polyp 05/20/2016  . Fatty liver 05/20/2016  . Obesity 05/01/2016  . Proteinuria 03/06/2015  . Unspecified vitamin D deficiency 07/14/2013  . LOW BACK PAIN SYNDROME 01/23/2010  . Status post radioactive iodine thyroid ablation 08/17/2009  . Hyperlipidemia 07/26/2008  . Essential hypertension 07/26/2008  . Pain in joint of left shoulder 07/26/2008  . Goiter 07/14/2007    Current Outpatient Medications on File Prior to Visit  Medication Sig Dispense Refill  . amLODipine-benazepril (LOTREL) 5-20 MG capsule TAKE 1 TABLET BY MOUTH ONCE DAILY 90 capsule 3  . BELVIQ XR 20 MG TB24   4  . Cholecalciferol (VITAMIN D3) 2000 UNITS TABS Take by mouth daily.    Marland Kitchen estradiol (VIVELLE-DOT) 0.025 MG/24HR Place 1 patch onto the skin 2 (two) times a week.      . levothyroxine (SYNTHROID, LEVOTHROID) 100 MCG tablet Take 100 mcg by mouth daily before breakfast.    . Loratadine (CLARITIN PO) Take by mouth daily.    Marland Kitchen triamterene-hydrochlorothiazide (DYAZIDE) 37.5-25 MG capsule Take 1 each (1 capsule total) by mouth daily. 90 capsule 3  . Vitamin D, Ergocalciferol, (DRISDOL) 50000 UNITS CAPS capsule Take 50,000 Units by mouth every 7 (seven) days.     No current facility-administered medications on file prior to visit.     Past Medical History:  Diagnosis Date  . Allergy   . Anemia    due to Dysmenorrhea  . Arthritis    generalized   . Hypertension    initially after BCP initiated   . Thyroid disease 2005 &  2009   nodule    Past Surgical History:  Procedure Laterality Date  . ABDOMINAL HYSTERECTOMY  1998   for heavy menses and fibroids  . BIOPSY THYROID  2005,2009   both negative  . CESAREAN SECTION     x2  . COLONOSCOPY  2009    Dr [facility]  . EXPLORATORY LAPAROTOMY  1985   Ovarian cyst  . FLEXIBLE SIGMOIDOSCOPY  1985    LLQ pain due to ovarian cyst  . SIGMOIDOSCOPY    . TONSILLECTOMY      Social History   Socioeconomic History  . Marital status: Married    Spouse name: Not on file  . Number of children: Not on file  . Years of education: Not on file  . Highest education level: Not on file  Occupational History  . Not on file  Social Needs  . Financial resource strain: Not on file  . Food insecurity:    Worry: Not on file    Inability: Not on file  . Transportation needs:    Medical: Not on file    Non-medical: Not on file  Tobacco Use  . Smoking status: Never Smoker  . Smokeless tobacco: Never Used  Substance and Sexual Activity  . Alcohol use: No  . Drug use: No  . Sexual activity: Not Currently  Lifestyle  . Physical activity:  Days per week: Not on file    Minutes per session: Not on file  . Stress: Not on file  Relationships  . Social connections:    Talks on phone: Not on file    Gets together: Not on file    Attends religious service: Not on file    Active member of club or organization: Not on file    Attends meetings of clubs or organizations: Not on file    Relationship status: Not on file  Other Topics Concern  . Not on file  Social History Narrative   RN at [employer]      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       Family History  Problem Relation Age of Onset  . Hypertension Father   . Kidney failure Father        Bright's Disease; died @ 66  . Hypertension Mother   . Hyperlipidemia Mother   . Osteoarthritis Mother   . Hypertension Brother   . Diabetes Brother   . Stroke Maternal Grandmother        in 65s  . Hypertension  Maternal Aunt        also DJD/ OA  . Breast cancer Maternal Aunt   . Heart attack Maternal Grandfather 69       also  CVA  . Heart block Paternal Grandmother        pacemaker  . Colon cancer Neg Hx   . Colon polyps Neg Hx   . Esophageal cancer Neg Hx   . Rectal cancer Neg Hx   . Stomach cancer Neg Hx     Review of Systems  Constitutional: Negative for chills and fever.  Eyes: Negative for visual disturbance.  Respiratory: Negative for cough, shortness of breath and wheezing.   Cardiovascular: Negative for chest pain, palpitations and leg swelling.  Gastrointestinal: Negative for abdominal pain, blood in stool, constipation, diarrhea and nausea.       No gerd  Genitourinary: Negative for dysuria and hematuria.  Musculoskeletal: Positive for arthralgias. Negative for back pain.  Skin: Negative for color change and rash.  Neurological: Negative for light-headedness and headaches.  Psychiatric/Behavioral: Positive for sleep disturbance (difficulty at times). Negative for dysphoric mood. The patient is not nervous/anxious.        Objective:   Vitals:   07/10/18 0955  BP: 124/82  Pulse: (!) 113  Resp: 16  Temp: 98.1 F (36.7 C)  SpO2: 94%   Filed Weights   07/10/18 0955  Weight: 203 lb (92.1 kg)   Body mass index is 34.84 kg/m.  Wt Readings from Last 3 Encounters:  07/10/18 203 lb (92.1 kg)  06/01/18 208 lb (94.3 kg)  05/08/18 199 lb (90.3 kg)     Physical Exam Constitutional: She appears well-developed and well-nourished. No distress.  HENT:  Head: Normocephalic and atraumatic.  Right Ear: External ear normal. Normal ear canal and TM Left Ear: External ear normal.  Normal ear canal and TM Mouth/Throat: Oropharynx is clear and moist.  Eyes: Conjunctivae and EOM are normal.  Neck: Neck supple. No tracheal deviation present. No thyromegaly present.  No carotid bruit  Cardiovascular: Normal rate, regular rhythm and normal heart sounds.   No murmur heard.  No  edema. Pulmonary/Chest: Effort normal and breath sounds normal. No respiratory distress. She has no wheezes. She has no rales.  Breast: deferred to Gyn Abdominal: Soft. She exhibits no distension. There is no tenderness.  Lymphadenopathy: She has no cervical adenopathy.  Skin: Skin is  warm and dry. She is not diaphoretic.  Psychiatric: She has a normal mood and affect. Her behavior is normal.        Assessment & Plan:   Physical exam: Screening blood work  Ordered - had some done by Dr Chalmers Cater Immunizations   Discussed shingrix, others up to date Colonoscopy   Up to date  Mammogram   Up to date  Gyn    Up to date  - Dr Gita Kudo Dexa    Due   - ordered Eye exams    Up to date  EKG   Done 06/2017 Exercise   none Weight   Working on weight loss - has taken belviq Skin   No concerns, sees derm  Substance abuse   none  See Problem List for Assessment and Plan of chronic medical problems.     FU in one year

## 2018-07-09 NOTE — Patient Instructions (Addendum)
Test(s) ordered today. Your results will be released to [facility] (or called to you) after review, usually within 72hours after test completion. If any changes need to be made, you will be notified at that same time.  All other Health Maintenance issues reviewed.   All recommended immunizations and age-appropriate screenings are up-to-date or discussed.  No immunizations administered today.   Medications reviewed and updated.  No changes recommended at this time.   Please followup in one year   Health Maintenance, Female Adopting a healthy lifestyle and getting preventive care can go a long way to promote health and wellness. Talk with your health care provider about what schedule of regular examinations is right for you. This is a good chance for you to check in with your provider about disease prevention and staying healthy. In between checkups, there are plenty of things you can do on your own. Experts have done a lot of research about which lifestyle changes and preventive measures are most likely to keep you healthy. Ask your health care provider for more information. Weight and diet Eat a healthy diet  Be sure to include plenty of vegetables, fruits, low-fat dairy products, and lean protein.  Do not eat a lot of foods high in solid fats, added sugars, or salt.  Get regular exercise. This is one of the most important things you can do for your health. ? Most adults should exercise for at least 150 minutes each week. The exercise should increase your heart rate and make you sweat (moderate-intensity exercise). ? Most adults should also do strengthening exercises at least twice a week. This is in addition to the moderate-intensity exercise.  Maintain a healthy weight  Body mass index (BMI) is a measurement that can be used to identify possible weight problems. It estimates body fat based on height and weight. Your health care provider can help determine your BMI and help you achieve or  maintain a healthy weight.  For females 64 years of age and older: ? A BMI below 18.5 is considered underweight. ? A BMI of 18.5 to 24.9 is normal. ? A BMI of 25 to 29.9 is considered overweight. ? A BMI of 30 and above is considered obese.  Watch levels of cholesterol and blood lipids  You should start having your blood tested for lipids and cholesterol at 64 years of age, then have this test every 5 years.  You may need to have your cholesterol levels checked more often if: ? Your lipid or cholesterol levels are high. ? You are older than 64 years of age. ? You are at high risk for heart disease.  Cancer screening Lung Cancer  Lung cancer screening is recommended for adults 72-7 years old who are at high risk for lung cancer because of a history of smoking.  A yearly low-dose CT scan of the lungs is recommended for people who: ? Currently smoke. ? Have quit within the past 15 years. ? Have at least a 30-pack-year history of smoking. A pack year is smoking an average of one pack of cigarettes a day for 1 year.  Yearly screening should continue until it has been 15 years since you quit.  Yearly screening should stop if you develop a health problem that would prevent you from having lung cancer treatment.  Breast Cancer  Practice breast self-awareness. This means understanding how your breasts normally appear and feel.  It also means doing regular breast self-exams. Let your health care provider know about any changes,  no matter how small.  If you are in your 20s or 30s, you should have a clinical breast exam (CBE) by a health care provider every 1-3 years as part of a regular health exam.  If you are 40 or older, have a CBE every year. Also consider having a breast X-ray (mammogram) every year.  If you have a family history of breast cancer, talk to your health care provider about genetic screening.  If you are at high risk for breast cancer, talk to your health care  provider about having an MRI and a mammogram every year.  Breast cancer gene (BRCA) assessment is recommended for women who have family members with BRCA-related cancers. BRCA-related cancers include: ? Breast. ? Ovarian. ? Tubal. ? Peritoneal cancers.  Results of the assessment will determine the need for genetic counseling and BRCA1 and BRCA2 testing.  Cervical Cancer Your health care provider may recommend that you be screened regularly for cancer of the pelvic organs (ovaries, uterus, and vagina). This screening involves a pelvic examination, including checking for microscopic changes to the surface of your cervix (Pap test). You may be encouraged to have this screening done every 3 years, beginning at age 21.  For women ages 30-65, health care providers may recommend pelvic exams and Pap testing every 3 years, or they may recommend the Pap and pelvic exam, combined with testing for human papilloma virus (HPV), every 5 years. Some types of HPV increase your risk of cervical cancer. Testing for HPV may also be done on women of any age with unclear Pap test results.  Other health care providers may not recommend any screening for nonpregnant women who are considered low risk for pelvic cancer and who do not have symptoms. Ask your health care provider if a screening pelvic exam is right for you.  If you have had past treatment for cervical cancer or a condition that could lead to cancer, you need Pap tests and screening for cancer for at least 20 years after your treatment. If Pap tests have been discontinued, your risk factors (such as having a new sexual partner) need to be reassessed to determine if screening should resume. Some women have medical problems that increase the chance of getting cervical cancer. In these cases, your health care provider may recommend more frequent screening and Pap tests.  Colorectal Cancer  This type of cancer can be detected and often prevented.  Routine  colorectal cancer screening usually begins at 64 years of age and continues through 64 years of age.  Your health care provider may recommend screening at an earlier age if you have risk factors for colon cancer.  Your health care provider may also recommend using home test kits to check for hidden blood in the stool.  A small camera at the end of a tube can be used to examine your colon directly (sigmoidoscopy or colonoscopy). This is done to check for the earliest forms of colorectal cancer.  Routine screening usually begins at age 50.  Direct examination of the colon should be repeated every 5-10 years through 64 years of age. However, you may need to be screened more often if early forms of precancerous polyps or small growths are found.  Skin Cancer  Check your skin from head to toe regularly.  Tell your health care provider about any new moles or changes in moles, especially if there is a change in a mole's shape or color.  Also tell your health care provider if you   have a mole that is larger than the size of a pencil eraser.  Always use sunscreen. Apply sunscreen liberally and repeatedly throughout the day.  Protect yourself by wearing long sleeves, pants, a wide-brimmed hat, and sunglasses whenever you are outside.  Heart disease, diabetes, and high blood pressure  High blood pressure causes heart disease and increases the risk of stroke. High blood pressure is more likely to develop in: ? People who have blood pressure in the high end of the normal range (130-139/85-89 mm Hg). ? People who are overweight or obese. ? People who are African American.  If you are 24-25 years of age, have your blood pressure checked every 3-5 years. If you are 2 years of age or older, have your blood pressure checked every year. You should have your blood pressure measured twice-once when you are at a hospital or clinic, and once when you are not at a hospital or clinic. Record the average of the  two measurements. To check your blood pressure when you are not at a hospital or clinic, you can use: ? An automated blood pressure machine at a pharmacy. ? A home blood pressure monitor.  If you are between 42 years and 59 years old, ask your health care provider if you should take aspirin to prevent strokes.  Have regular diabetes screenings. This involves taking a blood sample to check your fasting blood sugar level. ? If you are at a normal weight and have a low risk for diabetes, have this test once every three years after 64 years of age. ? If you are overweight and have a high risk for diabetes, consider being tested at a younger age or more often. Preventing infection Hepatitis B  If you have a higher risk for hepatitis B, you should be screened for this virus. You are considered at high risk for hepatitis B if: ? You were born in a country where hepatitis B is common. Ask your health care provider which countries are considered high risk. ? Your parents were born in a high-risk country, and you have not been immunized against hepatitis B (hepatitis B vaccine). ? You have HIV or AIDS. ? You use needles to inject street drugs. ? You live with someone who has hepatitis B. ? You have had sex with someone who has hepatitis B. ? You get hemodialysis treatment. ? You take certain medicines for conditions, including cancer, organ transplantation, and autoimmune conditions.  Hepatitis C  Blood testing is recommended for: ? Everyone born from [birth year] through 1967. ? Anyone with known risk factors for hepatitis C.  Sexually transmitted infections (STIs)  You should be screened for sexually transmitted infections (STIs) including gonorrhea and chlamydia if: ? You are sexually active and are younger than 64 years of age. ? You are older than 64 years of age and your health care provider tells you that you are at risk for this type of infection. ? Your sexual activity has changed since you  were last screened and you are at an increased risk for chlamydia or gonorrhea. Ask your health care provider if you are at risk.  If you do not have HIV, but are at risk, it may be recommended that you take a prescription medicine daily to prevent HIV infection. This is called pre-exposure prophylaxis (PrEP). You are considered at risk if: ? You are sexually active and do not regularly use condoms or know the HIV status of your partner(s). ? You take drugs by injection. ?  You are sexually active with a partner who has HIV.  Talk with your health care provider about whether you are at high risk of being infected with HIV. If you choose to begin PrEP, you should first be tested for HIV. You should then be tested every 3 months for as long as you are taking PrEP. Pregnancy  If you are premenopausal and you may become pregnant, ask your health care provider about preconception counseling.  If you may become pregnant, take 400 to 800 micrograms (mcg) of folic acid every day.  If you want to prevent pregnancy, talk to your health care provider about birth control (contraception). Osteoporosis and menopause  Osteoporosis is a disease in which the bones lose minerals and strength with aging. This can result in serious bone fractures. Your risk for osteoporosis can be identified using a bone density scan.  If you are 65 years of age or older, or if you are at risk for osteoporosis and fractures, ask your health care provider if you should be screened.  Ask your health care provider whether you should take a calcium or vitamin D supplement to lower your risk for osteoporosis.  Menopause may have certain physical symptoms and risks.  Hormone replacement therapy may reduce some of these symptoms and risks. Talk to your health care provider about whether hormone replacement therapy is right for you. Follow these instructions at home:  Schedule regular health, dental, and eye exams.  Stay current  with your immunizations.  Do not use any tobacco products including cigarettes, chewing tobacco, or electronic cigarettes.  If you are pregnant, do not drink alcohol.  If you are breastfeeding, limit how much and how often you drink alcohol.  Limit alcohol intake to no more than 1 drink per day for nonpregnant women. One drink equals 12 ounces of beer, 5 ounces of wine, or 1 ounces of hard liquor.  Do not use street drugs.  Do not share needles.  Ask your health care provider for help if you need support or information about quitting drugs.  Tell your health care provider if you often feel depressed.  Tell your health care provider if you have ever been abused or do not feel safe at home. This information is not intended to replace advice given to you by your health care provider. Make sure you discuss any questions you have with your health care provider. Document Released: 05/27/2011 Document Revised: 04/18/2016 Document Reviewed: 08/15/2015 Elsevier Interactive Patient Education  2018 Elsevier Inc.  

## 2018-07-10 ENCOUNTER — Ambulatory Visit: Payer: 59 | Admitting: Internal Medicine

## 2018-07-10 ENCOUNTER — Encounter: Payer: Self-pay | Admitting: Internal Medicine

## 2018-07-10 ENCOUNTER — Other Ambulatory Visit (INDEPENDENT_AMBULATORY_CARE_PROVIDER_SITE_OTHER): Payer: 59

## 2018-07-10 VITALS — BP 124/82 | HR 113 | Temp 98.1°F | Resp 16 | Ht 64.0 in | Wt 203.0 lb

## 2018-07-10 DIAGNOSIS — Z0000 Encounter for general adult medical examination without abnormal findings: Secondary | ICD-10-CM

## 2018-07-10 DIAGNOSIS — I10 Essential (primary) hypertension: Secondary | ICD-10-CM

## 2018-07-10 DIAGNOSIS — E6609 Other obesity due to excess calories: Secondary | ICD-10-CM

## 2018-07-10 DIAGNOSIS — Z6834 Body mass index (BMI) 34.0-34.9, adult: Secondary | ICD-10-CM

## 2018-07-10 DIAGNOSIS — R739 Hyperglycemia, unspecified: Secondary | ICD-10-CM

## 2018-07-10 DIAGNOSIS — E782 Mixed hyperlipidemia: Secondary | ICD-10-CM

## 2018-07-10 DIAGNOSIS — K760 Fatty (change of) liver, not elsewhere classified: Secondary | ICD-10-CM

## 2018-07-10 DIAGNOSIS — E890 Postprocedural hypothyroidism: Secondary | ICD-10-CM | POA: Diagnosis not present

## 2018-07-10 LAB — COMPREHENSIVE METABOLIC PANEL
ALBUMIN: 4.4 g/dL (ref 3.5–5.2)
ALT: 28 U/L (ref 0–35)
AST: 18 U/L (ref 0–37)
Alkaline Phosphatase: 70 U/L (ref 39–117)
BILIRUBIN TOTAL: 0.4 mg/dL (ref 0.2–1.2)
BUN: 13 mg/dL (ref 6–23)
CHLORIDE: 101 meq/L (ref 96–112)
CO2: 34 mEq/L — ABNORMAL HIGH (ref 19–32)
CREATININE: 0.78 mg/dL (ref 0.40–1.20)
Calcium: 9.7 mg/dL (ref 8.4–10.5)
GFR: 78.91 mL/min (ref >60.00)
Glucose, Bld: 84 mg/dL (ref 70–99)
Potassium: 3.7 mEq/L (ref 3.5–5.1)
SODIUM: 139 meq/L (ref 135–145)
Total Protein: 7 g/dL (ref 6.0–8.3)

## 2018-07-10 LAB — CBC WITH DIFFERENTIAL/PLATELET
BASOS ABS: 0.1 10*3/uL (ref 0.0–0.1)
BASOS PCT: 0.9 % (ref 0.0–3.0)
EOS ABS: 0.1 10*3/uL (ref 0.0–0.7)
Eosinophils Relative: 1.2 % (ref 0.0–5.0)
HCT: 41.6 % (ref 36.0–46.0)
HEMOGLOBIN: 14.3 g/dL (ref 12.0–15.0)
Lymphocytes Relative: 30 % (ref 12.0–46.0)
Lymphs Abs: 2.1 10*3/uL (ref 0.7–4.0)
MCHC: 34.4 g/dL (ref 30.0–36.0)
MCV: 91.9 fl (ref 78.0–100.0)
MONO ABS: 0.5 10*3/uL (ref 0.1–1.0)
Monocytes Relative: 6.8 % (ref 3.0–12.0)
NEUTROS ABS: 4.2 10*3/uL (ref 1.4–7.7)
Neutrophils Relative %: 61.1 % (ref 43.0–77.0)
PLATELETS: 230 10*3/uL (ref 150.0–400.0)
RBC: 4.53 Mil/uL (ref 3.87–5.11)
RDW: 14.1 % (ref 11.5–15.5)
WBC: 6.9 10*3/uL (ref 4.0–10.5)

## 2018-07-10 LAB — LIPID PANEL
CHOLESTEROL: 215 mg/dL — AB (ref 0–200)
HDL: 48.3 mg/dL (ref >39.00)
LDL Cholesterol: 131 mg/dL — ABNORMAL HIGH (ref 0–99)
NonHDL: 166.49
Total CHOL/HDL Ratio: 4
Triglycerides: 177 mg/dL — ABNORMAL HIGH (ref 0.0–149.0)
VLDL: 35.4 mg/dL (ref 0.0–40.0)

## 2018-07-10 LAB — HEMOGLOBIN A1C: HEMOGLOBIN A1C: 5.5 % (ref 4.6–6.5)

## 2018-07-10 NOTE — Assessment & Plan Note (Signed)
BP well controlled Current regimen effective and well tolerated Continue current medications at current doses cmp  

## 2018-07-10 NOTE — Assessment & Plan Note (Signed)
Management per Dr. Balan 

## 2018-07-10 NOTE — Assessment & Plan Note (Signed)
Not on medication - wants to avoid - afraid it will harm her liver Check lipid panel, cmp Regular exercise and healthy diet encouraged

## 2018-07-10 NOTE — Assessment & Plan Note (Addendum)
With fatty liver disease Prescribed Belviq by Dr Chalmers Cater Stressed regular exercise and decreased portions

## 2018-07-10 NOTE — Assessment & Plan Note (Signed)
cmp Working on weight loss- stressed weight loss

## 2018-07-10 NOTE — Assessment & Plan Note (Signed)
Check a1c Low sugar / carb diet Stressed regular exercise, weight loss  

## 2018-07-11 ENCOUNTER — Encounter: Payer: Self-pay | Admitting: Internal Medicine

## 2018-07-16 ENCOUNTER — Other Ambulatory Visit: Payer: Self-pay | Admitting: Internal Medicine

## 2018-08-17 DIAGNOSIS — E7800 Pure hypercholesterolemia, unspecified: Secondary | ICD-10-CM | POA: Diagnosis not present

## 2018-08-17 DIAGNOSIS — E890 Postprocedural hypothyroidism: Secondary | ICD-10-CM | POA: Diagnosis not present

## 2018-08-17 DIAGNOSIS — I10 Essential (primary) hypertension: Secondary | ICD-10-CM | POA: Diagnosis not present

## 2018-08-17 DIAGNOSIS — R7301 Impaired fasting glucose: Secondary | ICD-10-CM | POA: Diagnosis not present

## 2018-08-17 DIAGNOSIS — K7689 Other specified diseases of liver: Secondary | ICD-10-CM | POA: Diagnosis not present

## 2018-08-17 DIAGNOSIS — E669 Obesity, unspecified: Secondary | ICD-10-CM | POA: Diagnosis not present

## 2018-08-17 DIAGNOSIS — E559 Vitamin D deficiency, unspecified: Secondary | ICD-10-CM | POA: Diagnosis not present

## 2018-08-19 ENCOUNTER — Telehealth: Payer: Self-pay | Admitting: Internal Medicine

## 2018-08-19 DIAGNOSIS — M85852 Other specified disorders of bone density and structure, left thigh: Secondary | ICD-10-CM

## 2018-08-19 DIAGNOSIS — M8580 Other specified disorders of bone density and structure, unspecified site: Secondary | ICD-10-CM | POA: Insufficient documentation

## 2018-08-19 NOTE — Telephone Encounter (Signed)
Appt scheduled

## 2018-08-19 NOTE — Telephone Encounter (Signed)
Copied from [facility] [phone]. Topic: Quick Communication - See Telephone Encounter >> Aug 19, 2018 11:10 AM Gardiner Ramus wrote: CRM for notification. See Telephone encounter for: 08/19/18. Pt called and stated that she and dr burns talked about having a bone density test. Pt would like orders put in so that she can schedule. Please advise Cb#[phone]

## 2018-08-19 NOTE — Telephone Encounter (Signed)
Ordered - done downstairs.  Please schedule

## 2018-08-19 NOTE — Telephone Encounter (Signed)
Last bone density 2016, please advise on orders

## 2018-09-14 DIAGNOSIS — H02052 Trichiasis without entropian right lower eyelid: Secondary | ICD-10-CM | POA: Diagnosis not present

## 2018-09-14 DIAGNOSIS — D3131 Benign neoplasm of right choroid: Secondary | ICD-10-CM | POA: Diagnosis not present

## 2018-09-14 DIAGNOSIS — H5213 Myopia, bilateral: Secondary | ICD-10-CM | POA: Diagnosis not present

## 2018-09-15 ENCOUNTER — Inpatient Hospital Stay: Admission: RE | Admit: 2018-09-15 | Payer: 59 | Source: Ambulatory Visit

## 2018-09-16 ENCOUNTER — Ambulatory Visit (INDEPENDENT_AMBULATORY_CARE_PROVIDER_SITE_OTHER)
Admission: RE | Admit: 2018-09-16 | Discharge: 2018-09-16 | Disposition: A | Discharge status: Home / Self Care | Payer: 59 | Source: Ambulatory Visit | Attending: Internal Medicine | Admitting: Internal Medicine

## 2018-09-16 DIAGNOSIS — M85852 Other specified disorders of bone density and structure, left thigh: Secondary | ICD-10-CM | POA: Diagnosis not present

## 2018-09-17 ENCOUNTER — Encounter: Payer: Self-pay | Admitting: Internal Medicine

## 2018-09-28 ENCOUNTER — Other Ambulatory Visit (INDEPENDENT_AMBULATORY_CARE_PROVIDER_SITE_OTHER): Payer: 59

## 2018-09-28 ENCOUNTER — Encounter: Payer: Self-pay | Admitting: Internal Medicine

## 2018-09-28 ENCOUNTER — Ambulatory Visit
Admission: RE | Admit: 2018-09-28 | Discharge: 2018-09-28 | Disposition: A | Discharge status: Home / Self Care | Payer: 59 | Source: Ambulatory Visit | Attending: Internal Medicine | Admitting: Internal Medicine

## 2018-09-28 ENCOUNTER — Ambulatory Visit: Payer: 59 | Admitting: Internal Medicine

## 2018-09-28 VITALS — BP 132/86 | HR 81 | Temp 98.5°F | Resp 16 | Ht 64.0 in | Wt 198.0 lb

## 2018-09-28 DIAGNOSIS — E559 Vitamin D deficiency, unspecified: Secondary | ICD-10-CM

## 2018-09-28 DIAGNOSIS — M549 Dorsalgia, unspecified: Secondary | ICD-10-CM

## 2018-09-28 DIAGNOSIS — N2889 Other specified disorders of kidney and ureter: Secondary | ICD-10-CM | POA: Diagnosis not present

## 2018-09-28 DIAGNOSIS — R82998 Other abnormal findings in urine: Secondary | ICD-10-CM | POA: Diagnosis not present

## 2018-09-28 LAB — URINALYSIS, ROUTINE W REFLEX MICROSCOPIC
Bilirubin Urine: NEGATIVE
Hgb urine dipstick: NEGATIVE
KETONES UR: NEGATIVE
Nitrite: NEGATIVE
PH: 7.5 (ref 5.0–8.0)
SPECIFIC GRAVITY, URINE: 1.01 (ref 1.000–1.030)
TOTAL PROTEIN, URINE-UPE24: NEGATIVE
URINE GLUCOSE: NEGATIVE
UROBILINOGEN UA: 0.2 (ref 0.0–1.0)

## 2018-09-28 LAB — COMPREHENSIVE METABOLIC PANEL
ALBUMIN: 4.5 g/dL (ref 3.5–5.2)
ALK PHOS: 76 U/L (ref 39–117)
ALT: 29 U/L (ref 0–35)
AST: 21 U/L (ref 0–37)
BUN: 10 mg/dL (ref 6–23)
CALCIUM: 9.5 mg/dL (ref 8.4–10.5)
CO2: 29 mEq/L (ref 19–32)
CREATININE: 0.79 mg/dL (ref 0.40–1.20)
Chloride: 100 mEq/L (ref 96–112)
GFR: 77.7 mL/min (ref >60.00)
Glucose, Bld: 105 mg/dL — ABNORMAL HIGH (ref 70–99)
Potassium: 3.7 mEq/L (ref 3.5–5.1)
Sodium: 137 mEq/L (ref 135–145)
Total Bilirubin: 0.5 mg/dL (ref 0.2–1.2)
Total Protein: 7.3 g/dL (ref 6.0–8.3)

## 2018-09-28 LAB — VITAMIN D 25 HYDROXY (VIT D DEFICIENCY, FRACTURES): VITD: 59.5 ng/mL (ref 30.00–100.00)

## 2018-09-28 NOTE — Assessment & Plan Note (Signed)
Check d level. 

## 2018-09-28 NOTE — Assessment & Plan Note (Signed)
4 days of R CVA pain - constant associated with dark urine ? UTI, kidney stone [place] of kidneys UA, UCx cmp Aleve as needed Further treatment based on above

## 2018-09-28 NOTE — Progress Notes (Signed)
Subjective:    Patient ID: Joan [name], female    DOB: 1956/05/16, 62 y.o.   MRN: [id number]  HPI The patient is here for an acute visit.   Back pain:  It started 4 days ago and it got worse over the weekend.  It is in the right mid back / CVA area.  The pain was fairly constant.  Sometimes laying on her right side feels best.  She has been taking aleve 2 tabs twice daily  - maybe it helped a little.  She denies urinary symptoms except for concentrated urine for which she increased her fluid intake.  She denies fever or chills.  She denies abdominal pain, nausea and changes in bowels.    She denies a history of kidney stones.  She denies any injury or new activities.    Vitamin D def:  She is taking high dose vitamin d weekly and her daily vitamin d.  She wonders if this is too much or not enough.    Medications and allergies reviewed with patient and updated if appropriate.  Patient Active Problem List   Diagnosis Date Noted  . 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 Osteopenia 08/19/2018  . Hyperglycemia 07/09/2018  . Hypothyroidism 06/30/2017  . Gallbladder polyp 05/20/2016  . Fatty liver 05/20/2016  . Obesity 05/01/2016  . Proteinuria 03/06/2015  . Vitamin D deficiency 07/14/2013  . LOW BACK PAIN SYNDROME 01/23/2010  . Status post radioactive iodine thyroid ablation 08/17/2009  . Hyperlipidemia 07/26/2008  . Essential hypertension 07/26/2008  . Pain in joint of left shoulder 07/26/2008  . Goiter 07/14/2007    Current Outpatient Medications on File Prior to Visit  Medication Sig Dispense Refill  . amLODipine-benazepril (LOTREL) 5-20 MG capsule TAKE 1 CAPSULE BY MOUTH ONCE DAILY 90 capsule 3  . BELVIQ XR 20 MG TB24   4  . Cholecalciferol (VITAMIN D3) 2000 UNITS TABS Take by mouth daily.    Marland Kitchen estradiol (VIVELLE-DOT) 0.025 MG/24HR Place 1 patch onto the skin 2 (two) times a week.      . levothyroxine (SYNTHROID, LEVOTHROID) 100 MCG tablet Take 100 mcg by mouth daily  before breakfast.    . Loratadine (CLARITIN PO) Take by mouth daily.    Marland Kitchen triamterene-hydrochlorothiazide (DYAZIDE) 37.5-25 MG capsule TAKE 1 CAPSULE BY MOUTH ONCE DAILY 90 capsule 3  . Vitamin D, Ergocalciferol, (DRISDOL) 50000 UNITS CAPS capsule Take 50,000 Units by mouth every 7 (seven) days.     No current facility-administered medications on file prior to visit.     Past Medical History:  Diagnosis Date  . Allergy   . Anemia    due to Dysmenorrhea  . Arthritis    generalized   . Hypertension    initially after BCP initiated   . Thyroid disease 2005 & 2009   nodule    Past Surgical History:  Procedure Laterality Date  . ABDOMINAL HYSTERECTOMY  1998   for heavy menses and fibroids  . BIOPSY THYROID  2005,2009   both negative  . CESAREAN SECTION     x2  . COLONOSCOPY  2009    Dr [facility]  . EXPLORATORY LAPAROTOMY  1985   Ovarian cyst  . FLEXIBLE SIGMOIDOSCOPY  1985    LLQ pain due to ovarian cyst  . SIGMOIDOSCOPY    . TONSILLECTOMY      Social History   Socioeconomic History  . Marital status: Married    Spouse name: Not on file  . Number of  children: Not on file  . Years of education: Not on file  . Highest education level: Not on file  Occupational History  . Not on file  Social Needs  . Financial resource strain: Not on file  . Food insecurity:    Worry: Not on file    Inability: Not on file  . Transportation needs:    Medical: Not on file    Non-medical: Not on file  Tobacco Use  . Smoking status: Never Smoker  . Smokeless tobacco: Never Used  Substance and Sexual Activity  . Alcohol use: No  . Drug use: No  . Sexual activity: Not Currently  Lifestyle  . Physical activity:    Days per week: Not on file    Minutes per session: Not on file  . Stress: Not on file  Relationships  . Social connections:    Talks on phone: Not on file    Gets together: Not on file    Attends religious service: Not on file    Active member of club or  organization: Not on file    Attends meetings of clubs or organizations: Not on file    Relationship status: Not on file  Other Topics Concern  . Not on file  Social History Narrative   RN at [employer]      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       Family History  Problem Relation Age of Onset  . Hypertension Father   . Kidney failure Father        Bright's Disease; died @ 21  . Hypertension Mother   . Hyperlipidemia Mother   . Osteoarthritis Mother   . Hypertension Brother   . Diabetes Brother   . Stroke Maternal Grandmother        in 54s  . Hypertension Maternal Aunt        also DJD/ OA  . Breast cancer Maternal Aunt   . Heart attack Maternal Grandfather 69       also  CVA  . Heart block Paternal Grandmother        pacemaker  . Colon cancer Neg Hx   . Colon polyps Neg Hx   . Esophageal cancer Neg Hx   . Rectal cancer Neg Hx   . Stomach cancer Neg Hx     Review of Systems  Constitutional: Negative for chills and fever.  Gastrointestinal: Negative for abdominal pain, blood in stool, constipation, diarrhea and nausea.  Genitourinary: Negative for dysuria, frequency, hematuria and urgency.       Urine was more concentrated; no urine odor  Skin: Negative for rash.  Neurological: Negative for numbness.       Objective:   Vitals:   09/28/18 0930  BP: 132/86  Pulse: 81  Resp: 16  Temp: 98.5 F (36.9 C)  SpO2: 97%   BP Readings from Last 3 Encounters:  09/28/18 132/86  07/10/18 124/82  06/01/18 118/84   Wt Readings from Last 3 Encounters:  09/28/18 198 lb (89.8 kg)  07/10/18 203 lb (92.1 kg)  06/01/18 208 lb (94.3 kg)   Body mass index is 33.99 kg/m.   Physical Exam  Constitutional: She appears well-developed and well-nourished. No distress.  HENT:  Head: Normocephalic and atraumatic.  Cardiovascular: Normal rate, regular rhythm and normal heart sounds.  Pulmonary/Chest: Effort normal and breath sounds normal. She has no wheezes. She has no rales.    Abdominal: Soft. She exhibits no distension. There is no tenderness.  Genitourinary:  Genitourinary Comments:  Right CVA tenderness - minimal  Musculoskeletal: She exhibits no edema.  Skin: Skin is warm and dry. She is not diaphoretic.           Assessment & Plan:    See Problem List for Assessment and Plan of chronic medical problems.

## 2018-09-28 NOTE — Patient Instructions (Signed)
Give blood work and urine today.   Tests ordered today. Your results will be released to [facility] (or called to you) after review, usually within 72hours after test completion. If any changes need to be made, you will be notified at that same time.   A kidney ultrasound was ordered.    Medications reviewed and updated.  Changes include :   None.  Continue to take aleve for the back pain.

## 2018-09-28 NOTE — Assessment & Plan Note (Signed)
dark urine, R CVA pain ? UTI, kidney stone [place] of kidneys UA, UCx cmp Aleve as needed Further treatment based on above

## 2018-09-29 LAB — URINE CULTURE
MICRO NUMBER: 91323767
SPECIMEN QUALITY: ADEQUATE

## 2018-09-29 MED ORDER — NITROFURANTOIN MONOHYD MACRO 100 MG PO CAPS: 100.0000 mg | ORAL_CAPSULE | Freq: Two times a day (BID) | ORAL | 0 refills | Status: DC

## 2018-09-30 NOTE — Telephone Encounter (Signed)
Copied from [facility] [phone]. Topic: General - Other >> Sep 30, 2018  3:19 PM Keene Breath wrote: Reason for CRM: Patient called to have nurse call her back so she could follow up on a few questions.  Patient did not want to explain any further.  CB# [phone]

## 2018-09-30 NOTE — Telephone Encounter (Signed)
Pt spoke to about her results.

## 2019-01-22 ENCOUNTER — Encounter: Payer: Self-pay | Admitting: Internal Medicine

## 2019-01-22 ENCOUNTER — Other Ambulatory Visit: Payer: 59

## 2019-01-22 ENCOUNTER — Ambulatory Visit: Payer: 59 | Admitting: Internal Medicine

## 2019-01-22 VITALS — BP 158/92 | HR 89 | Temp 98.0°F | Resp 16 | Ht 64.0 in | Wt 208.4 lb

## 2019-01-22 DIAGNOSIS — R0602 Shortness of breath: Secondary | ICD-10-CM | POA: Diagnosis not present

## 2019-01-22 DIAGNOSIS — M549 Dorsalgia, unspecified: Secondary | ICD-10-CM

## 2019-01-22 DIAGNOSIS — R8290 Unspecified abnormal findings in urine: Secondary | ICD-10-CM | POA: Diagnosis not present

## 2019-01-22 LAB — POCT URINALYSIS DIPSTICK
BILIRUBIN UA: NEGATIVE
Glucose, UA: NEGATIVE
KETONES UA: NEGATIVE
NITRITE UA: NEGATIVE
PH UA: 7 (ref 5.0–8.0)
PROTEIN UA: NEGATIVE
SPEC GRAV UA: 1.02 (ref 1.010–1.025)
UROBILINOGEN UA: NEGATIVE U/dL — AB

## 2019-01-22 MED ORDER — MELOXICAM 15 MG PO TABS: 15.0000 mg | ORAL_TABLET | Freq: Every day | ORAL | 0 refills | Status: DC

## 2019-01-22 NOTE — Progress Notes (Signed)
Subjective:    Patient ID: Joan [name], female    DOB: [birth year], 63 y.o.   MRN: [id number]  HPI The patient is here for an acute visit.   Right mid back pain: 2 days ago she started having pain near her right CVA region that occasionally radiates downward.  She thinks she may have woke up with it.  She denies any unusual activity that may have caused it.  The pain is worse with moving around and when she is completely still her pain may go away completely.  Aleve helps, but will not completely resolve the pain.  She has had this in the past.  The pain is constant.  She states her urine smells a little strong and she is concerned about a possible UTI or pain in her kidney.  She denies fever, abdominal pain, nausea, hematuria, dysuria and increased urinary frequency.   Renal ultrasound 09/2018, which was with her last episode showed normal kidneys, bladder.  Her last urinalysis showed a possible infection, but culture did not grow bacteria.  She did take Macrobid at that time and she thinks it may have helped.  Shortness of breath: Chest is states some shortness of breath.  She has had this for a little while.  She is currently not exercising regularly.  She denies chest pain, palpitations.   Medications and allergies reviewed with patient and updated if appropriate.  Patient Active Problem List   Diagnosis Date Noted  . 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 Dark urine 09/28/2018  . Osteopenia 08/19/2018  . Hyperglycemia 07/09/2018  . Hypothyroidism 06/30/2017  . Gallbladder polyp 05/20/2016  . Fatty liver 05/20/2016  . Obesity 05/01/2016  . Proteinuria 03/06/2015  . Vitamin D deficiency 07/14/2013  . LOW BACK PAIN SYNDROME 01/23/2010  . Status post radioactive iodine thyroid ablation 08/17/2009  . Hyperlipidemia 07/26/2008  . Essential hypertension 07/26/2008  . Pain in joint of left shoulder 07/26/2008  . Goiter 07/14/2007    Current Outpatient Medications on File  Prior to Visit  Medication Sig Dispense Refill  . amLODipine-benazepril (LOTREL) 5-20 MG capsule TAKE 1 CAPSULE BY MOUTH ONCE DAILY 90 capsule 3  . Cholecalciferol (VITAMIN D3) 2000 UNITS TABS Take by mouth daily.    Marland Kitchen estradiol (VIVELLE-DOT) 0.025 MG/24HR Place 1 patch onto the skin 2 (two) times a week.      . levothyroxine (SYNTHROID, LEVOTHROID) 100 MCG tablet Take 100 mcg by mouth daily before breakfast.    . Loratadine (CLARITIN PO) Take by mouth daily.    Marland Kitchen triamterene-hydrochlorothiazide (DYAZIDE) 37.5-25 MG capsule TAKE 1 CAPSULE BY MOUTH ONCE DAILY 90 capsule 3  . Vitamin D, Ergocalciferol, (DRISDOL) 50000 UNITS CAPS capsule Take 50,000 Units by mouth every 7 (seven) days.     No current facility-administered medications on file prior to visit.     Past Medical History:  Diagnosis Date  . Allergy   . Anemia    due to Dysmenorrhea  . Arthritis    generalized   . Hypertension    initially after BCP initiated   . Thyroid disease 2005 & 2009   nodule    Past Surgical History:  Procedure Laterality Date  . ABDOMINAL HYSTERECTOMY  1998   for heavy menses and fibroids  . BIOPSY THYROID  2005,2009   both negative  . CESAREAN SECTION     x2  . COLONOSCOPY  2009    Dr [facility]  . EXPLORATORY LAPAROTOMY  1985   Ovarian cyst  .  FLEXIBLE SIGMOIDOSCOPY  1985    LLQ pain due to ovarian cyst  . SIGMOIDOSCOPY    . TONSILLECTOMY      Social History   Socioeconomic History  . Marital status: Married    Spouse name: Not on file  . Number of children: Not on file  . Years of education: Not on file  . Highest education level: Not on file  Occupational History  . Not on file  Social Needs  . Financial resource strain: Not on file  . Food insecurity:    Worry: Not on file    Inability: Not on file  . Transportation needs:    Medical: Not on file    Non-medical: Not on file  Tobacco Use  . Smoking status: Never Smoker  . Smokeless tobacco: Never Used    Substance and Sexual Activity  . Alcohol use: No  . Drug use: No  . Sexual activity: Not Currently  Lifestyle  . Physical activity:    Days per week: Not on file    Minutes per session: Not on file  . Stress: Not on file  Relationships  . Social connections:    Talks on phone: Not on file    Gets together: Not on file    Attends religious service: Not on file    Active member of club or organization: Not on file    Attends meetings of clubs or organizations: Not on file    Relationship status: Not on file  Other Topics Concern  . Not on file  Social History Narrative   RN at [employer]      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       Family History  Problem Relation Age of Onset  . Hypertension Father   . Kidney failure Father        Bright's Disease; died @ 47  . Hypertension Mother   . Hyperlipidemia Mother   . Osteoarthritis Mother   . Hypertension Brother   . Diabetes Brother   . Stroke Maternal Grandmother        in 76s  . Hypertension Maternal Aunt        also DJD/ OA  . Breast cancer Maternal Aunt   . Heart attack Maternal Grandfather 69       also  CVA  . Heart block Paternal Grandmother        pacemaker  . Colon cancer Neg Hx   . Colon polyps Neg Hx   . Esophageal cancer Neg Hx   . Rectal cancer Neg Hx   . Stomach cancer Neg Hx     Review of Systems  Constitutional: Negative for chills and fever.  Gastrointestinal: Negative for abdominal pain and nausea.  Genitourinary: Negative for dysuria, frequency and hematuria.       Darker urine, abnormal smell       Objective:   Vitals:   01/22/19 1423  BP: (!) 158/92  Pulse: 89  Resp: 16  Temp: 98 F (36.7 C)  SpO2: 98%   BP Readings from Last 3 Encounters:  01/22/19 (!) 158/92  09/28/18 132/86  07/10/18 124/82   Wt Readings from Last 3 Encounters:  01/22/19 208 lb 6.4 oz (94.5 kg)  09/28/18 198 lb (89.8 kg)  07/10/18 203 lb (92.1 kg)   Body mass index is 35.77 kg/m.   Physical  Exam Constitutional:      General: She is not in acute distress.    Appearance: Normal appearance. She is obese. She is  not ill-appearing.  Cardiovascular:     Rate and Rhythm: Normal rate and regular rhythm.  Pulmonary:     Effort: Pulmonary effort is normal.     Breath sounds: Normal breath sounds. No wheezing or rhonchi.  Abdominal:     General: There is no distension.     Palpations: Abdomen is soft.     Tenderness: There is no abdominal tenderness.  Musculoskeletal:        General: Tenderness (Minimal tenderness right CVA area) present.  Skin:    General: Skin is warm and dry.  Neurological:     Mental Status: She is alert.            Assessment & Plan:    See Problem List for Assessment and Plan of chronic medical problems.

## 2019-01-22 NOTE — Patient Instructions (Addendum)
Try meloxicam once daily for your pain.  Take with food.  Do not take any advil or aleve while taking this.     We will call you with the results of the urine culture.     Try a heating pad.     Call if your symptoms get worse.

## 2019-01-23 DIAGNOSIS — R0602 Shortness of breath: Secondary | ICD-10-CM | POA: Insufficient documentation

## 2019-01-23 DIAGNOSIS — R8290 Unspecified abnormal findings in urine: Secondary | ICD-10-CM | POA: Insufficient documentation

## 2019-01-23 LAB — URINE CULTURE
MICRO NUMBER:: 256623
SPECIMEN QUALITY:: ADEQUATE

## 2019-01-23 NOTE — Assessment & Plan Note (Signed)
Not new, but ongoing No chest pain or palpitations Possibly related to deconditioning-not exercising and weight Need to rule out cardiac cause-we will refer to cardiology Encouraged more regular exercise and weight loss

## 2019-01-23 NOTE — Assessment & Plan Note (Signed)
Urine dip has leukocytes, but no other abnormal findings Will send for culture Associated with right CVA pain, which I believe is musculoskeletal No antibiotic for now until culture comes back

## 2019-01-23 NOTE — Assessment & Plan Note (Signed)
Started 2 days ago, worse with movement, better with staying still and Aleve Urine darker and has a slight odor, but no other concerning symptoms Ultrasound with first episode was normal and urine culture had no growth Pain sounds musculoskeletal in nature Urine dip here showed some leukocytes, but will wait for culture before we treat Meloxicam daily, heating pad Call if no improvement

## 2019-01-24 ENCOUNTER — Encounter: Payer: Self-pay | Admitting: Internal Medicine

## 2019-01-27 DIAGNOSIS — M546 Pain in thoracic spine: Secondary | ICD-10-CM | POA: Diagnosis not present

## 2019-01-27 DIAGNOSIS — M549 Dorsalgia, unspecified: Secondary | ICD-10-CM | POA: Diagnosis not present

## 2019-01-27 DIAGNOSIS — K760 Fatty (change of) liver, not elsewhere classified: Secondary | ICD-10-CM | POA: Diagnosis not present

## 2019-01-27 DIAGNOSIS — R3121 Asymptomatic microscopic hematuria: Secondary | ICD-10-CM | POA: Diagnosis not present

## 2019-01-27 DIAGNOSIS — M545 Low back pain: Secondary | ICD-10-CM | POA: Diagnosis not present

## 2019-01-27 DIAGNOSIS — M9903 Segmental and somatic dysfunction of lumbar region: Secondary | ICD-10-CM | POA: Diagnosis not present

## 2019-01-27 DIAGNOSIS — R3129 Other microscopic hematuria: Secondary | ICD-10-CM | POA: Diagnosis not present

## 2019-01-27 DIAGNOSIS — M9902 Segmental and somatic dysfunction of thoracic region: Secondary | ICD-10-CM | POA: Diagnosis not present

## 2019-01-28 DIAGNOSIS — M545 Low back pain: Secondary | ICD-10-CM | POA: Diagnosis not present

## 2019-01-28 DIAGNOSIS — M546 Pain in thoracic spine: Secondary | ICD-10-CM | POA: Diagnosis not present

## 2019-01-28 DIAGNOSIS — M9902 Segmental and somatic dysfunction of thoracic region: Secondary | ICD-10-CM | POA: Diagnosis not present

## 2019-01-28 DIAGNOSIS — M9903 Segmental and somatic dysfunction of lumbar region: Secondary | ICD-10-CM | POA: Diagnosis not present

## 2019-02-05 DIAGNOSIS — M9902 Segmental and somatic dysfunction of thoracic region: Secondary | ICD-10-CM | POA: Diagnosis not present

## 2019-02-05 DIAGNOSIS — M545 Low back pain: Secondary | ICD-10-CM | POA: Diagnosis not present

## 2019-02-05 DIAGNOSIS — M546 Pain in thoracic spine: Secondary | ICD-10-CM | POA: Diagnosis not present

## 2019-02-05 DIAGNOSIS — M9903 Segmental and somatic dysfunction of lumbar region: Secondary | ICD-10-CM | POA: Diagnosis not present

## 2019-02-10 DIAGNOSIS — M546 Pain in thoracic spine: Secondary | ICD-10-CM | POA: Diagnosis not present

## 2019-02-10 DIAGNOSIS — M9902 Segmental and somatic dysfunction of thoracic region: Secondary | ICD-10-CM | POA: Diagnosis not present

## 2019-02-10 DIAGNOSIS — M545 Low back pain: Secondary | ICD-10-CM | POA: Diagnosis not present

## 2019-02-10 DIAGNOSIS — M9903 Segmental and somatic dysfunction of lumbar region: Secondary | ICD-10-CM | POA: Diagnosis not present

## 2019-04-06 ENCOUNTER — Other Ambulatory Visit: Payer: Self-pay | Admitting: Gynecology

## 2019-04-06 DIAGNOSIS — Z1231 Encounter for screening mammogram for malignant neoplasm of breast: Secondary | ICD-10-CM

## 2019-05-06 DIAGNOSIS — Z1389 Encounter for screening for other disorder: Secondary | ICD-10-CM | POA: Diagnosis not present

## 2019-05-06 DIAGNOSIS — Z79890 Hormone replacement therapy (postmenopausal): Secondary | ICD-10-CM | POA: Diagnosis not present

## 2019-05-06 DIAGNOSIS — Z01419 Encounter for gynecological examination (general) (routine) without abnormal findings: Secondary | ICD-10-CM | POA: Diagnosis not present

## 2019-05-06 DIAGNOSIS — Z130 Encounter for screening for diseases of the blood and blood-forming organs and certain disorders involving the immune mechanism: Secondary | ICD-10-CM | POA: Diagnosis not present

## 2019-05-06 DIAGNOSIS — Z6836 Body mass index (BMI) 36.0-36.9, adult: Secondary | ICD-10-CM | POA: Diagnosis not present

## 2019-05-19 DIAGNOSIS — Z1283 Encounter for screening for malignant neoplasm of skin: Secondary | ICD-10-CM | POA: Diagnosis not present

## 2019-05-19 DIAGNOSIS — D225 Melanocytic nevi of trunk: Secondary | ICD-10-CM | POA: Diagnosis not present

## 2019-05-19 DIAGNOSIS — L218 Other seborrheic dermatitis: Secondary | ICD-10-CM | POA: Diagnosis not present

## 2019-05-31 DIAGNOSIS — E559 Vitamin D deficiency, unspecified: Secondary | ICD-10-CM | POA: Diagnosis not present

## 2019-05-31 DIAGNOSIS — E7800 Pure hypercholesterolemia, unspecified: Secondary | ICD-10-CM | POA: Diagnosis not present

## 2019-05-31 DIAGNOSIS — I10 Essential (primary) hypertension: Secondary | ICD-10-CM | POA: Diagnosis not present

## 2019-05-31 DIAGNOSIS — E890 Postprocedural hypothyroidism: Secondary | ICD-10-CM | POA: Diagnosis not present

## 2019-05-31 DIAGNOSIS — K7689 Other specified diseases of liver: Secondary | ICD-10-CM | POA: Diagnosis not present

## 2019-05-31 DIAGNOSIS — R7301 Impaired fasting glucose: Secondary | ICD-10-CM | POA: Diagnosis not present

## 2019-06-03 ENCOUNTER — Ambulatory Visit
Admission: RE | Admit: 2019-06-03 | Discharge: 2019-06-03 | Disposition: A | Discharge status: Home / Self Care | Payer: 59 | Source: Ambulatory Visit | Attending: Gynecology | Admitting: Gynecology

## 2019-06-03 ENCOUNTER — Other Ambulatory Visit: Payer: Self-pay

## 2019-06-03 DIAGNOSIS — Z1231 Encounter for screening mammogram for malignant neoplasm of breast: Secondary | ICD-10-CM | POA: Diagnosis not present

## 2019-07-13 ENCOUNTER — Other Ambulatory Visit: Payer: Self-pay | Admitting: Internal Medicine

## 2019-07-14 NOTE — Patient Instructions (Addendum)
Tests ordered today. Your results will be released to [place] (or called to you) after review.  If any changes need to be made, you will be notified at that same time.  All other Health Maintenance issues reviewed.   All recommended immunizations and age-appropriate screenings are up-to-date or discussed.  Prevnar pneumonia immunization administered today.   Medications reviewed and updated.  Changes include :   none    Please followup in 6 months     Health Maintenance, Female Adopting a healthy lifestyle and getting preventive care are important in promoting health and wellness. Ask your health care provider about:  The right schedule for you to have regular tests and exams.  Things you can do on your own to prevent diseases and keep yourself healthy. What should I know about diet, weight, and exercise? Eat a healthy diet   Eat a diet that includes plenty of vegetables, fruits, low-fat dairy products, and lean protein.  Do not eat a lot of foods that are high in solid fats, added sugars, or sodium. Maintain a healthy weight Body mass index (BMI) is used to identify weight problems. It estimates body fat based on height and weight. Your health care provider can help determine your BMI and help you achieve or maintain a healthy weight. Get regular exercise Get regular exercise. This is one of the most important things you can do for your health. Most adults should:  Exercise for at least 150 minutes each week. The exercise should increase your heart rate and make you sweat (moderate-intensity exercise).  Do strengthening exercises at least twice a week. This is in addition to the moderate-intensity exercise.  Spend less time sitting. Even light physical activity can be beneficial. Watch cholesterol and blood lipids Have your blood tested for lipids and cholesterol at 65 years of age, then have this test every 5 years. Have your cholesterol levels checked more often if:   Your lipid or cholesterol levels are high.  You are older than 65 years of age.  You are at high risk for heart disease. What should I know about cancer screening? Depending on your health history and family history, you may need to have cancer screening at various ages. This may include screening for:  Breast cancer.  Cervical cancer.  Colorectal cancer.  Skin cancer.  Lung cancer. What should I know about heart disease, diabetes, and high blood pressure? Blood pressure and heart disease  High blood pressure causes heart disease and increases the risk of stroke. This is more likely to develop in people who have high blood pressure readings, are of African descent, or are overweight.  Have your blood pressure checked: ? Every 3-5 years if you are 29-53 years of age. ? Every year if you are 54 years old or older. Diabetes Have regular diabetes screenings. This checks your fasting blood sugar level. Have the screening done:  Once every three years after age 70 if you are at a normal weight and have a low risk for diabetes.  More often and at a younger age if you are overweight or have a high risk for diabetes. What should I know about preventing infection? Hepatitis B If you have a higher risk for hepatitis B, you should be screened for this virus. Talk with your health care provider to find out if you are at risk for hepatitis B infection. Hepatitis C Testing is recommended for:  Everyone born from 1 through 1965.  Anyone with known risk factors for  hepatitis C. Sexually transmitted infections (STIs)  Get screened for STIs, including gonorrhea and chlamydia, if: ? You are sexually active and are younger than 65 years of age. ? You are older than 65 years of age and your health care provider tells you that you are at risk for this type of infection. ? Your sexual activity has changed since you were last screened, and you are at increased risk for chlamydia or gonorrhea.  Ask your health care provider if you are at risk.  Ask your health care provider about whether you are at high risk for HIV. Your health care provider may recommend a prescription medicine to help prevent HIV infection. If you choose to take medicine to prevent HIV, you should first get tested for HIV. You should then be tested every 3 months for as long as you are taking the medicine. Pregnancy  If you are about to stop having your period (premenopausal) and you may become pregnant, seek counseling before you get pregnant.  Take 400 to 800 micrograms (mcg) of folic acid every day if you become pregnant.  Ask for birth control (contraception) if you want to prevent pregnancy. Osteoporosis and menopause Osteoporosis is a disease in which the bones lose minerals and strength with aging. This can result in bone fractures. If you are 5 years old or older, or if you are at risk for osteoporosis and fractures, ask your health care provider if you should:  Be screened for bone loss.  Take a calcium or vitamin D supplement to lower your risk of fractures.  Be given hormone replacement therapy (HRT) to treat symptoms of menopause. Follow these instructions at home: Lifestyle  Do not use any products that contain nicotine or tobacco, such as cigarettes, e-cigarettes, and chewing tobacco. If you need help quitting, ask your health care provider.  Do not use street drugs.  Do not share needles.  Ask your health care provider for help if you need support or information about quitting drugs. Alcohol use  Do not drink alcohol if: ? Your health care provider tells you not to drink. ? You are pregnant, may be pregnant, or are planning to become pregnant.  If you drink alcohol: ? Limit how much you use to 0-1 drink a day. ? Limit intake if you are breastfeeding.  Be aware of how much alcohol is in your drink. In the [place] one drink equals one 12 oz bottle of beer (355 mL), one 5 oz glass of wine  (148 mL), or one 1 oz glass of hard liquor (44 mL). General instructions  Schedule regular health, dental, and eye exams.  Stay current with your vaccines.  Tell your health care provider if: ? You often feel depressed. ? You have ever been abused or do not feel safe at home. Summary  Adopting a healthy lifestyle and getting preventive care are important in promoting health and wellness.  Follow your health care provider's instructions about healthy diet, exercising, and getting tested or screened for diseases.  Follow your health care provider's instructions on monitoring your cholesterol and blood pressure. This information is not intended to replace advice given to you by your health care provider. Make sure you discuss any questions you have with your health care provider. Document Released: 05/27/2011 Document Revised: 11/04/2018 Document Reviewed: 11/04/2018 Elsevier Patient Education  2020 [employer].

## 2019-07-14 NOTE — Progress Notes (Signed)
Subjective:    Patient ID: Joan [name], female    DOB: 1956/01/28, 63 y.o.   MRN: [id number]  HPI She is here for a physical exam.   She has been doing slim fast twice a day and eating dinner.  She is doing some walking and doing some arm exercises.  She has also been taking care of her mother who is 27.   Medications and allergies reviewed with patient and updated if appropriate.  Patient Active Problem List   Diagnosis Date Noted  . SOB (shortness of breath) 01/23/2019  . 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 Osteopenia 08/19/2018  . Hyperglycemia 07/09/2018  . Hypothyroidism 06/30/2017  . Gallbladder polyp 05/20/2016  . Fatty liver 05/20/2016  . Obesity 05/01/2016  . Vitamin D deficiency 07/14/2013  . LOW BACK PAIN SYNDROME 01/23/2010  . Status post radioactive iodine thyroid ablation 08/17/2009  . Hyperlipidemia 07/26/2008  . Essential hypertension 07/26/2008  . Pain in joint of left shoulder 07/26/2008  . Goiter 07/14/2007    Current Outpatient Medications on File Prior to Visit  Medication Sig Dispense Refill  . amLODipine-benazepril (LOTREL) 5-20 MG capsule TAKE 1 CAPSULE BY MOUTH ONCE DAILY 90 capsule 0  . Cholecalciferol (VITAMIN D3) 2000 UNITS TABS Take by mouth daily.    Marland Kitchen estradiol (VIVELLE-DOT) 0.025 MG/24HR Place 1 patch onto the skin 2 (two) times a week.      . levothyroxine (SYNTHROID, LEVOTHROID) 100 MCG tablet Take 100 mcg by mouth daily before breakfast.    . Loratadine (CLARITIN PO) Take by mouth daily.    Marland Kitchen triamterene-hydrochlorothiazide (DYAZIDE) 37.5-25 MG capsule TAKE 1 CAPSULE BY MOUTH ONCE DAILY 90 capsule 0  . Vitamin D, Ergocalciferol, (DRISDOL) 50000 UNITS CAPS capsule Take 50,000 Units by mouth every 7 (seven) days.     No current facility-administered medications on file prior to visit.     Past Medical History:  Diagnosis Date  . Allergy   . Anemia    due to Dysmenorrhea  . Arthritis    generalized   . Hypertension    initially after BCP initiated   . Thyroid disease 2005 & 2009   nodule    Past Surgical History:  Procedure Laterality Date  . ABDOMINAL HYSTERECTOMY  1998   for heavy menses and fibroids  . BIOPSY THYROID  2005,2009   both negative  . CESAREAN SECTION     x2  . COLONOSCOPY  2009    Dr [facility]  . EXPLORATORY LAPAROTOMY  1985   Ovarian cyst  . FLEXIBLE SIGMOIDOSCOPY  1985    LLQ pain due to ovarian cyst  . SIGMOIDOSCOPY    . TONSILLECTOMY      Social History   Socioeconomic History  . Marital status: Married    Spouse name: Not on file  . Number of children: Not on file  . Years of education: Not on file  . Highest education level: Not on file  Occupational History  . Not on file  Social Needs  . Financial resource strain: Not on file  . Food insecurity    Worry: Not on file    Inability: Not on file  . Transportation needs    Medical: Not on file    Non-medical: Not on file  Tobacco Use  . Smoking status: Never Smoker  . Smokeless tobacco: Never Used  Substance and Sexual Activity  . Alcohol use: No  . Drug use: No  . Sexual activity: Not Currently  Lifestyle  .  Physical activity    Days per week: Not on file    Minutes per session: Not on file  . Stress: Not on file  Relationships  . Social Herbalist on phone: Not on file    Gets together: Not on file    Attends religious service: Not on file    Active member of club or organization: Not on file    Attends meetings of clubs or organizations: Not on file    Relationship status: Not on file  Other Topics Concern  . Not on file  Social History Narrative   RN at [employer]      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       Family History  Problem Relation Age of Onset  . Hypertension Father   . Kidney failure Father        Bright's Disease; died @ 25  . Hypertension Mother   . Hyperlipidemia Mother   . Osteoarthritis Mother   . Hypertension Brother   . Diabetes Brother   . Stroke  Maternal Grandmother        in 41s  . Hypertension Maternal Aunt        also DJD/ OA  . Breast cancer Maternal Aunt   . Heart attack Maternal Grandfather 69       also  CVA  . Heart block Paternal Grandmother        pacemaker  . Colon cancer Neg Hx   . Colon polyps Neg Hx   . Esophageal cancer Neg Hx   . Rectal cancer Neg Hx   . Stomach cancer Neg Hx     Review of Systems  Constitutional: Negative for chills and fever.  Eyes: Negative for visual disturbance.  Respiratory: Negative for cough, shortness of breath and wheezing.   Cardiovascular: Negative for chest pain, palpitations and leg swelling.  Gastrointestinal: Negative for abdominal pain, blood in stool, constipation, diarrhea and nausea.       No gerd  Genitourinary: Negative for dysuria and hematuria.  Musculoskeletal: Positive for arthralgias and back pain (arthritis).  Skin: Negative for color change and rash.  Neurological: Negative for dizziness, light-headedness, numbness and headaches.  Psychiatric/Behavioral: Negative for dysphoric mood. The patient is not nervous/anxious.        Objective:   Vitals:   07/15/19 0928  BP: 130/84  Pulse: 80  Temp: 98 F (36.7 C)  SpO2: 96%   Filed Weights   07/15/19 0928  Weight: 204 lb 9.6 oz (92.8 kg)   Body mass index is 35.12 kg/m.  BP Readings from Last 3 Encounters:  07/15/19 130/84  01/22/19 (!) 158/92  09/28/18 132/86    Wt Readings from Last 3 Encounters:  07/15/19 204 lb 9.6 oz (92.8 kg)  01/22/19 208 lb 6.4 oz (94.5 kg)  09/28/18 198 lb (89.8 kg)     Physical Exam Constitutional: She appears well-developed and well-nourished. No distress.  HENT:  Head: Normocephalic and atraumatic.  Right Ear: External ear normal. Normal ear canal and TM Left Ear: External ear normal.  Normal ear canal and TM Mouth/Throat: Oropharynx is clear and moist.  Eyes: Conjunctivae and EOM are normal.  Neck: Neck supple. No tracheal deviation present. No thyromegaly  present.  No carotid bruit  Cardiovascular: Normal rate, regular rhythm and normal heart sounds.   No murmur heard.  No edema. Pulmonary/Chest: Effort normal and breath sounds normal. No respiratory distress. She has no wheezes. She has no rales.  Breast: deferred  Abdominal: Soft. She exhibits no distension. There is no tenderness.  Lymphadenopathy: She has no cervical adenopathy.  Skin: Skin is warm and dry. She is not diaphoretic.  Psychiatric: She has a normal mood and affect. Her behavior is normal.        Assessment & Plan:   Physical exam: Screening blood work ordered Immunizations  prevnar today, will get flu at work, discussed shingrix Colonoscopy    Up to date  Mammogram   Up to date  Gyn  Dr Gita Kudo -  Up to date Dexa    Up to date  Eye exams   Up to date  Exercise  doing some walking, arm exercises Weight   working on weight loss Skin   Sees derm, no concerns Substance abuse   none  See Problem List for Assessment and Plan of chronic medical problems.   FU in 6 months

## 2019-07-15 ENCOUNTER — Other Ambulatory Visit (INDEPENDENT_AMBULATORY_CARE_PROVIDER_SITE_OTHER): Payer: 59

## 2019-07-15 ENCOUNTER — Encounter: Payer: Self-pay | Admitting: Internal Medicine

## 2019-07-15 ENCOUNTER — Other Ambulatory Visit: Payer: Self-pay

## 2019-07-15 ENCOUNTER — Ambulatory Visit (INDEPENDENT_AMBULATORY_CARE_PROVIDER_SITE_OTHER): Payer: 59 | Admitting: Internal Medicine

## 2019-07-15 VITALS — BP 130/84 | HR 80 | Temp 98.0°F | Ht 64.0 in | Wt 204.6 lb

## 2019-07-15 DIAGNOSIS — M85852 Other specified disorders of bone density and structure, left thigh: Secondary | ICD-10-CM

## 2019-07-15 DIAGNOSIS — Z0000 Encounter for general adult medical examination without abnormal findings: Secondary | ICD-10-CM | POA: Diagnosis not present

## 2019-07-15 DIAGNOSIS — E6609 Other obesity due to excess calories: Secondary | ICD-10-CM

## 2019-07-15 DIAGNOSIS — R739 Hyperglycemia, unspecified: Secondary | ICD-10-CM

## 2019-07-15 DIAGNOSIS — E782 Mixed hyperlipidemia: Secondary | ICD-10-CM | POA: Diagnosis not present

## 2019-07-15 DIAGNOSIS — Z6834 Body mass index (BMI) 34.0-34.9, adult: Secondary | ICD-10-CM

## 2019-07-15 DIAGNOSIS — I10 Essential (primary) hypertension: Secondary | ICD-10-CM

## 2019-07-15 DIAGNOSIS — Z23 Encounter for immunization: Secondary | ICD-10-CM | POA: Diagnosis not present

## 2019-07-15 DIAGNOSIS — E890 Postprocedural hypothyroidism: Secondary | ICD-10-CM

## 2019-07-15 LAB — CBC WITH DIFFERENTIAL/PLATELET
Basophils Absolute: 0.1 10*3/uL (ref 0.0–0.1)
Basophils Relative: 0.9 % (ref 0.0–3.0)
Eosinophils Absolute: 0.1 10*3/uL (ref 0.0–0.7)
Eosinophils Relative: 1.2 % (ref 0.0–5.0)
HCT: 42.3 % (ref 36.0–46.0)
Hemoglobin: 14.7 g/dL (ref 12.0–15.0)
Lymphocytes Relative: 24.7 % (ref 12.0–46.0)
Lymphs Abs: 2.1 10*3/uL (ref 0.7–4.0)
MCHC: 34.8 g/dL (ref 30.0–36.0)
MCV: 92.7 fl (ref 78.0–100.0)
Monocytes Absolute: 0.6 10*3/uL (ref 0.1–1.0)
Monocytes Relative: 6.9 % (ref 3.0–12.0)
Neutro Abs: 5.6 10*3/uL (ref 1.4–7.7)
Neutrophils Relative %: 66.3 % (ref 43.0–77.0)
Platelets: 235 10*3/uL (ref 150.0–400.0)
RBC: 4.56 Mil/uL (ref 3.87–5.11)
RDW: 13.9 % (ref 11.5–15.5)
WBC: 8.4 10*3/uL (ref 4.0–10.5)

## 2019-07-15 LAB — COMPREHENSIVE METABOLIC PANEL
ALT: 32 U/L (ref 0–35)
AST: 22 U/L (ref 0–37)
Albumin: 4.7 g/dL (ref 3.5–5.2)
Alkaline Phosphatase: 84 U/L (ref 39–117)
BUN: 15 mg/dL (ref 6–23)
CO2: 28 mEq/L (ref 19–32)
Calcium: 9.7 mg/dL (ref 8.4–10.5)
Chloride: 100 mEq/L (ref 96–112)
Creatinine, Ser: 0.74 mg/dL (ref 0.40–1.20)
GFR: 78.64 mL/min (ref >60.00)
Glucose, Bld: 113 mg/dL — ABNORMAL HIGH (ref 70–99)
Potassium: 3.9 mEq/L (ref 3.5–5.1)
Sodium: 137 mEq/L (ref 135–145)
Total Bilirubin: 0.4 mg/dL (ref 0.2–1.2)
Total Protein: 7.5 g/dL (ref 6.0–8.3)

## 2019-07-15 LAB — LIPID PANEL
Cholesterol: 263 mg/dL — ABNORMAL HIGH (ref 0–200)
HDL: 55.7 mg/dL (ref >39.00)
LDL Cholesterol: 167 mg/dL — ABNORMAL HIGH (ref 0–99)
NonHDL: 206.94
Total CHOL/HDL Ratio: 5
Triglycerides: 199 mg/dL — ABNORMAL HIGH (ref 0.0–149.0)
VLDL: 39.8 mg/dL (ref 0.0–40.0)

## 2019-07-15 NOTE — Assessment & Plan Note (Addendum)
Following with Dr Chalmers Cater - last visit one month ago Clinically euthyroid

## 2019-07-15 NOTE — Assessment & Plan Note (Signed)
dexa up to date Taking vitamin d daily walking

## 2019-07-15 NOTE — Addendum Note (Signed)
Addended by: Marcina Millard on: 07/15/2019 10:27 AM   Modules accepted: Orders

## 2019-07-15 NOTE — Assessment & Plan Note (Signed)
Check lipid panel  Controlled with lifestyle Regular exercise and healthy diet encouraged

## 2019-07-15 NOTE — Assessment & Plan Note (Signed)
With htn, elevated sugar Walking and some exercises Has revised diet

## 2019-07-15 NOTE — Assessment & Plan Note (Addendum)
a1c last month 5.7% with Dr Chalmers Cater Low sugar / carb diet Stressed regular exercise

## 2019-07-15 NOTE — Assessment & Plan Note (Signed)
BP well controlled Current regimen effective and well tolerated Continue current medications at current doses cmp  

## 2019-09-16 DIAGNOSIS — H524 Presbyopia: Secondary | ICD-10-CM | POA: Diagnosis not present

## 2019-10-13 ENCOUNTER — Other Ambulatory Visit: Payer: Self-pay | Admitting: Internal Medicine

## 2020-01-04 ENCOUNTER — Ambulatory Visit: Payer: 59 | Admitting: Internal Medicine

## 2020-01-10 ENCOUNTER — Other Ambulatory Visit: Payer: Self-pay | Admitting: Internal Medicine

## 2020-03-28 ENCOUNTER — Ambulatory Visit: Payer: 59 | Admitting: Internal Medicine

## 2020-03-28 ENCOUNTER — Encounter: Payer: Self-pay | Admitting: Internal Medicine

## 2020-03-28 ENCOUNTER — Other Ambulatory Visit: Payer: Self-pay

## 2020-03-28 DIAGNOSIS — M5416 Radiculopathy, lumbar region: Secondary | ICD-10-CM | POA: Diagnosis not present

## 2020-03-28 MED ORDER — PREDNISONE 50 MG PO TABS: 50.0000 mg | ORAL_TABLET | Freq: Every day | ORAL | 0 refills | Status: DC

## 2020-03-28 NOTE — Assessment & Plan Note (Signed)
Acute Right buttock pain down right leg posteriorly Some tingling in posterior leg, no weakness Prednisone 50 mg qd x 5 days - no advil / aleve while taking prednisone Back exercises at home Avoid bending, lifting and twisting Consider PT if no improvement

## 2020-03-28 NOTE — Progress Notes (Signed)
Subjective:    Patient ID: Joan [name], female    DOB: Sep 28, 1956, 63 y.o.   MRN: [id number]  HPI The patient is here for an acute visit.   3 days ago her back started hurting and it has gotten worse.  The pain is in her right buttock and radiates down her right posterior leg.  She has a little numbness/tingling in her right upper posterior leg. She denies weakness in her right leg and muscle spasms.    She does carry her dogs and lifts them so that may have caused the pain.   Her pain is worse with standing.  Sitting down is ok.  Stretching helps.   Patches have not helped.  She is taking ibuprofen.    She denies fever, chills and changes in bowel/bladder.    Medications and allergies reviewed with patient and updated if appropriate.  Patient Active Problem List   Diagnosis Date Noted  . Lumbar radiculopathy 03/28/2020  . SOB (shortness of breath) 01/23/2019  . 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 Osteopenia 08/19/2018  . Hyperglycemia 07/09/2018  . Hypothyroidism 06/30/2017  . Gallbladder polyp 05/20/2016  . Fatty liver 05/20/2016  . Obesity 05/01/2016  . Vitamin D deficiency 07/14/2013  . LOW BACK PAIN SYNDROME 01/23/2010  . Status post radioactive iodine thyroid ablation 08/17/2009  . Hyperlipidemia 07/26/2008  . Essential hypertension 07/26/2008  . Pain in joint of left shoulder 07/26/2008  . Goiter 07/14/2007    Current Outpatient Medications on File Prior to Visit  Medication Sig Dispense Refill  . amLODipine-benazepril (LOTREL) 5-20 MG capsule TAKE 1 CAPSULE BY MOUTH ONCE DAILY 90 capsule 0  . Cholecalciferol (VITAMIN D3) 2000 UNITS TABS Take by mouth daily.    Marland Kitchen estradiol (VIVELLE-DOT) 0.025 MG/24HR Place 1 patch onto the skin 2 (two) times a week.      . levothyroxine (SYNTHROID, LEVOTHROID) 100 MCG tablet Take 100 mcg by mouth daily before breakfast.    . Loratadine (CLARITIN PO) Take by mouth daily.    Marland Kitchen triamterene-hydrochlorothiazide (DYAZIDE)  37.5-25 MG capsule TAKE 1 CAPSULE BY MOUTH ONCE DAILY 90 capsule 0  . Vitamin D, Ergocalciferol, (DRISDOL) 50000 UNITS CAPS capsule Take 50,000 Units by mouth every 7 (seven) days.     No current facility-administered medications on file prior to visit.    Past Medical History:  Diagnosis Date  . Allergy   . Anemia    due to Dysmenorrhea  . Arthritis    generalized   . Hypertension    initially after BCP initiated   . Thyroid disease 2005 & 2009   nodule    Past Surgical History:  Procedure Laterality Date  . ABDOMINAL HYSTERECTOMY  1998   for heavy menses and fibroids  . BIOPSY THYROID  2005,2009   both negative  . CESAREAN SECTION     x2  . COLONOSCOPY  2009    Dr [facility]  . EXPLORATORY LAPAROTOMY  1985   Ovarian cyst  . FLEXIBLE SIGMOIDOSCOPY  1985    LLQ pain due to ovarian cyst  . SIGMOIDOSCOPY    . TONSILLECTOMY      Social History   Socioeconomic History  . Marital status: Married    Spouse name: Not on file  . Number of children: Not on file  . Years of education: Not on file  . Highest education level: Not on file  Occupational History  . Not on file  Tobacco Use  . Smoking status: Never Smoker  .  Smokeless tobacco: Never Used  Substance and Sexual Activity  . Alcohol use: No  . Drug use: No  . Sexual activity: Not Currently  Other Topics Concern  . 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 Difficulty of Paying Living Expenses:   Food Insecurity:   . Worried About Charity fundraiser in the Last Year:   . Arboriculturist in the Last Year:   Transportation Needs:   . Film/video editor (Medical):   Marland Kitchen Lack of Transportation (Non-Medical):   Physical Activity:   . Days of Exercise per Week:   . Minutes of Exercise per Session:   Stress:   . Feeling of Stress :   Social Connections:   . Frequency of  Communication with Friends and Family:   . Frequency of Social Gatherings with Friends and Family:   . Attends Religious Services:   . Active Member of Clubs or Organizations:   . Attends Archivist Meetings:   Marland Kitchen Marital Status:     Family History  Problem Relation Age of Onset  . Hypertension Father   . Kidney failure Father        Bright's Disease; died @ 2  . Hypertension Mother   . Hyperlipidemia Mother   . Osteoarthritis Mother   . Hypertension Brother   . Diabetes Brother   . Stroke Maternal Grandmother        in 22s  . Hypertension Maternal Aunt        also DJD/ OA  . Breast cancer Maternal Aunt   . Heart attack Maternal Grandfather 69       also  CVA  . Heart block Paternal Grandmother        pacemaker  . Colon cancer Neg Hx   . Colon polyps Neg Hx   . Esophageal cancer Neg Hx   . Rectal cancer Neg Hx   . Stomach cancer Neg Hx     Review of Systems Per HPI      Objective:   Vitals:   03/28/20 1346  BP: 136/80  Pulse: 75  Resp: 18  Temp: 98.1 F (36.7 C)  SpO2: 96%   BP Readings from Last 3 Encounters:  03/28/20 136/80  07/15/19 130/84  01/22/19 (!) 158/92   Wt Readings from Last 3 Encounters:  03/28/20 205 lb 12.8 oz (93.4 kg)  07/15/19 204 lb 9.6 oz (92.8 kg)  01/22/19 208 lb 6.4 oz (94.5 kg)   Body mass index is 35.33 kg/m.   Physical Exam Constitutional:      General: She is not in acute distress.    Appearance: Normal appearance. She is not ill-appearing.  HENT:     Head: Normocephalic and atraumatic.  Musculoskeletal:        General: No swelling, tenderness (no tenderness in lumbar spine or across lower back) or deformity.  Skin:    General: Skin is warm and dry.  Neurological:     Mental Status: She is alert.     Sensory: No sensory deficit.     Motor: No weakness.            Assessment & Plan:    See Problem List for Assessment and Plan of chronic medical problems.    This visit occurred during the  SARS-CoV-2 public health emergency.  Safety protocols were in place, including screening questions prior to the visit, additional usage of staff PPE, and extensive cleaning of exam room while observing appropriate contact time as indicated for disinfecting solutions.

## 2020-03-28 NOTE — Patient Instructions (Addendum)
Medications reviewed and updated.  Changes include :   Prednisone 50 mg daily with breakfast   You can take tylenol while on the steroid.  Once you have completed the steroid you can take ibuprofen.   Your prescription(s) have been submitted to your pharmacy. Please take as directed and contact our office if you believe you are having problem(s) with the medication(s).     Sciatica Rehab Ask your health care provider which exercises are safe for you. Do exercises exactly as told by your health care provider and adjust them as directed. It is normal to feel mild stretching, pulling, tightness, or discomfort as you do these exercises. Stop right away if you feel sudden pain or your pain gets worse. Do not begin these exercises until told by your health care provider. Stretching and range-of-motion exercises These exercises warm up your muscles and joints and improve the movement and flexibility of your hips and back. These exercises also help to relieve pain, numbness, and tingling. Sciatic nerve glide 1. Sit in a chair with your head facing down toward your chest. Place your hands behind your back. Let your shoulders slump forward. 2. Slowly straighten one of your legs while you tilt your head back as if you are looking toward the ceiling. Only straighten your leg as far as you can without making your symptoms worse. 3. Hold this position for __________ seconds. 4. Slowly return to the starting position. 5. Repeat with your other leg. Repeat __________ times. Complete this exercise __________ times a day. Knee to chest with hip adduction and internal rotation  1. Lie on your back on a firm surface with both legs straight. 2. Bend one of your knees and move it up toward your chest until you feel a gentle stretch in your lower back and buttock. Then, move your knee toward the shoulder that is on the opposite side from your leg. This is hip adduction and internal rotation. ? Hold your leg in this  position by holding on to the front of your knee. 3. Hold this position for __________ seconds. 4. Slowly return to the starting position. 5. Repeat with your other leg. Repeat __________ times. Complete this exercise __________ times a day. Prone extension on elbows  1. Lie on your abdomen on a firm surface. A bed may be too soft for this exercise. 2. Prop yourself up on your elbows. 3. Use your arms to help lift your chest up until you feel a gentle stretch in your abdomen and your lower back. ? This will place some of your body weight on your elbows. If this is uncomfortable, try stacking pillows under your chest. ? Your hips should stay down, against the surface that you are lying on. Keep your hip and back muscles relaxed. 4. Hold this position for __________ seconds. 5. Slowly relax your upper body and return to the starting position. Repeat __________ times. Complete this exercise __________ times a day. Strengthening exercises These exercises build strength and endurance in your back. Endurance is the ability to use your muscles for a long time, even after they get tired. Pelvic tilt This exercise strengthens the muscles that lie deep in the abdomen. 1. Lie on your back on a firm surface. Bend your knees and keep your feet flat on the floor. 2. Tense your abdominal muscles. Tip your pelvis up toward the ceiling and flatten your lower back into the floor. ? To help with this exercise, you may place a small towel under  your lower back and try to push your back into the towel. 3. Hold this position for __________ seconds. 4. Let your muscles relax completely before you repeat this exercise. Repeat __________ times. Complete this exercise __________ times a day. Alternating arm and leg raises  1. Get on your hands and knees on a firm surface. If you are on a hard floor, you may want to use padding, such as an exercise mat, to cushion your knees. 2. Line up your arms and legs. Your  hands should be directly below your shoulders, and your knees should be directly below your hips. 3. Lift your left leg behind you. At the same time, raise your right arm and straighten it in front of you. ? Do not lift your leg higher than your hip. ? Do not lift your arm higher than your shoulder. ? Keep your abdominal and back muscles tight. ? Keep your hips facing the ground. ? Do not arch your back. ? Keep your balance carefully, and do not hold your breath. 4. Hold this position for __________ seconds. 5. Slowly return to the starting position. 6. Repeat with your right leg and your left arm. Repeat __________ times. Complete this exercise __________ times a day. Posture and body mechanics Good posture and healthy body mechanics can help to relieve stress in your body's tissues and joints. Body mechanics refers to the movements and positions of your body while you do your daily activities. Posture is part of body mechanics. Good posture means:  Your spine is in its natural S-curve position (neutral).  Your shoulders are pulled back slightly.  Your head is not tipped forward. Follow these guidelines to improve your posture and body mechanics in your everyday activities. Standing   When standing, keep your spine neutral and your feet about hip width apart. Keep a slight bend in your knees. Your ears, shoulders, and hips should line up.  When you do a task in which you stand in one place for a long time, place one foot up on a stable object that is 2-4 inches (5-10 cm) high, such as a footstool. This helps keep your spine neutral. Sitting   When sitting, keep your spine neutral and keep your feet flat on the floor. Use a footrest, if necessary, and keep your thighs parallel to the floor. Avoid rounding your shoulders, and avoid tilting your head forward.  When working at a desk or a computer, keep your desk at a height where your hands are slightly lower than your elbows. Slide  your chair under your desk so you are close enough to maintain good posture.  When working at a computer, place your monitor at a height where you are looking straight ahead and you do not have to tilt your head forward or downward to look at the screen. Resting  When lying down and resting, avoid positions that are most painful for you.  If you have pain with activities such as sitting, bending, stooping, or squatting, lie in a position in which your body does not bend very much. For example, avoid curling up on your side with your arms and knees near your chest (fetal position).  If you have pain with activities such as standing for a long time or reaching with your arms, lie with your spine in a neutral position and bend your knees slightly. Try the following positions: ? Lying on your side with a pillow between your knees. ? Lying on your back with a pillow  under your knees. Lifting   When lifting objects, keep your feet at least shoulder width apart and tighten your abdominal muscles.  Bend your knees and hips and keep your spine neutral. It is important to lift using the strength of your legs, not your back. Do not lock your knees straight out.  Always ask for help to lift heavy or awkward objects. This information is not intended to replace advice given to you by your health care provider. Make sure you discuss any questions you have with your health care provider. Document Revised: 03/05/2019 Document Reviewed: 12/03/2018 Elsevier Patient Education  [place].

## 2020-03-30 DIAGNOSIS — M9903 Segmental and somatic dysfunction of lumbar region: Secondary | ICD-10-CM | POA: Diagnosis not present

## 2020-03-30 DIAGNOSIS — M545 Low back pain: Secondary | ICD-10-CM | POA: Diagnosis not present

## 2020-03-30 DIAGNOSIS — M9902 Segmental and somatic dysfunction of thoracic region: Secondary | ICD-10-CM | POA: Diagnosis not present

## 2020-03-30 DIAGNOSIS — M546 Pain in thoracic spine: Secondary | ICD-10-CM | POA: Diagnosis not present

## 2020-03-31 DIAGNOSIS — M4310 Spondylolisthesis, site unspecified: Secondary | ICD-10-CM | POA: Diagnosis not present

## 2020-03-31 DIAGNOSIS — M545 Low back pain: Secondary | ICD-10-CM | POA: Diagnosis not present

## 2020-04-03 DIAGNOSIS — M9902 Segmental and somatic dysfunction of thoracic region: Secondary | ICD-10-CM | POA: Diagnosis not present

## 2020-04-03 DIAGNOSIS — M9903 Segmental and somatic dysfunction of lumbar region: Secondary | ICD-10-CM | POA: Diagnosis not present

## 2020-04-03 DIAGNOSIS — M546 Pain in thoracic spine: Secondary | ICD-10-CM | POA: Diagnosis not present

## 2020-04-03 DIAGNOSIS — M545 Low back pain: Secondary | ICD-10-CM | POA: Diagnosis not present

## 2020-04-04 DIAGNOSIS — M545 Low back pain: Secondary | ICD-10-CM | POA: Diagnosis not present

## 2020-04-04 DIAGNOSIS — M9902 Segmental and somatic dysfunction of thoracic region: Secondary | ICD-10-CM | POA: Diagnosis not present

## 2020-04-04 DIAGNOSIS — M9903 Segmental and somatic dysfunction of lumbar region: Secondary | ICD-10-CM | POA: Diagnosis not present

## 2020-04-04 DIAGNOSIS — M546 Pain in thoracic spine: Secondary | ICD-10-CM | POA: Diagnosis not present

## 2020-04-05 ENCOUNTER — Other Ambulatory Visit (HOSPITAL_COMMUNITY): Payer: Self-pay | Admitting: Endocrinology

## 2020-04-05 ENCOUNTER — Other Ambulatory Visit: Payer: Self-pay | Admitting: Internal Medicine

## 2020-04-06 ENCOUNTER — Telehealth: Payer: Self-pay | Admitting: Internal Medicine

## 2020-04-06 DIAGNOSIS — M9902 Segmental and somatic dysfunction of thoracic region: Secondary | ICD-10-CM | POA: Diagnosis not present

## 2020-04-06 DIAGNOSIS — M545 Low back pain: Secondary | ICD-10-CM | POA: Diagnosis not present

## 2020-04-06 DIAGNOSIS — M546 Pain in thoracic spine: Secondary | ICD-10-CM | POA: Diagnosis not present

## 2020-04-06 DIAGNOSIS — M9903 Segmental and somatic dysfunction of lumbar region: Secondary | ICD-10-CM | POA: Diagnosis not present

## 2020-04-06 DIAGNOSIS — M5416 Radiculopathy, lumbar region: Secondary | ICD-10-CM

## 2020-04-06 MED ORDER — GABAPENTIN 100 MG PO CAPS: 100.0000 mg | ORAL_CAPSULE | Freq: Every day | ORAL | 3 refills | Status: DC

## 2020-04-06 NOTE — Telephone Encounter (Signed)
   Patient wants to know if Gabapentin can be prescribed for her hip and back pain. Patient states Prednisone is not working Patient is currently doing physical therapy   Please advise

## 2020-04-06 NOTE — Telephone Encounter (Signed)
I sent gabapentin to [place].  100 mg pills - she should try 100-300 mg at night.  We can increase this further if tolerated. Have her update me via mychart.

## 2020-04-07 NOTE — Telephone Encounter (Signed)
Pt aware of response below.  

## 2020-04-09 ENCOUNTER — Encounter: Payer: Self-pay | Admitting: Internal Medicine

## 2020-04-10 DIAGNOSIS — M9902 Segmental and somatic dysfunction of thoracic region: Secondary | ICD-10-CM | POA: Diagnosis not present

## 2020-04-10 DIAGNOSIS — M546 Pain in thoracic spine: Secondary | ICD-10-CM | POA: Diagnosis not present

## 2020-04-10 DIAGNOSIS — M9903 Segmental and somatic dysfunction of lumbar region: Secondary | ICD-10-CM | POA: Diagnosis not present

## 2020-04-10 DIAGNOSIS — M545 Low back pain: Secondary | ICD-10-CM | POA: Diagnosis not present

## 2020-04-11 DIAGNOSIS — M546 Pain in thoracic spine: Secondary | ICD-10-CM | POA: Diagnosis not present

## 2020-04-11 DIAGNOSIS — M545 Low back pain: Secondary | ICD-10-CM | POA: Diagnosis not present

## 2020-04-11 DIAGNOSIS — M9902 Segmental and somatic dysfunction of thoracic region: Secondary | ICD-10-CM | POA: Diagnosis not present

## 2020-04-11 DIAGNOSIS — M9903 Segmental and somatic dysfunction of lumbar region: Secondary | ICD-10-CM | POA: Diagnosis not present

## 2020-04-13 ENCOUNTER — Encounter: Payer: Self-pay | Admitting: Internal Medicine

## 2020-04-13 DIAGNOSIS — M9902 Segmental and somatic dysfunction of thoracic region: Secondary | ICD-10-CM | POA: Diagnosis not present

## 2020-04-13 DIAGNOSIS — M9903 Segmental and somatic dysfunction of lumbar region: Secondary | ICD-10-CM | POA: Diagnosis not present

## 2020-04-13 DIAGNOSIS — M545 Low back pain: Secondary | ICD-10-CM | POA: Diagnosis not present

## 2020-04-13 DIAGNOSIS — M5416 Radiculopathy, lumbar region: Secondary | ICD-10-CM

## 2020-04-13 DIAGNOSIS — M546 Pain in thoracic spine: Secondary | ICD-10-CM | POA: Diagnosis not present

## 2020-04-13 NOTE — Telephone Encounter (Signed)
   Patient states he is still in a lot of pain, requesting order for MRI

## 2020-04-13 NOTE — Addendum Note (Signed)
Addended by: [name] on: 04/13/2020 04:17 PM   Modules accepted: Orders

## 2020-04-13 NOTE — Telephone Encounter (Signed)
MRI ordered.   We can increase gabapentin dose if she tolerates this well.   Does she want a referral to ortho?

## 2020-04-14 MED ORDER — GABAPENTIN 100 MG PO CAPS
300.0000 mg | ORAL_CAPSULE | Freq: Three times a day (TID) | ORAL | 3 refills | Status: DC
Start: 2020-04-14 — End: 2020-04-20

## 2020-04-14 NOTE — Telephone Encounter (Signed)
Spoke with pt. She does not need a referral to ortho at this time. She is wanting to see if the MRI can be put in stat. The soonest available time she was given was in month.

## 2020-04-14 NOTE — Telephone Encounter (Signed)
I changed this to stat.  She was notified via mychart.

## 2020-04-18 DIAGNOSIS — M9903 Segmental and somatic dysfunction of lumbar region: Secondary | ICD-10-CM | POA: Diagnosis not present

## 2020-04-18 DIAGNOSIS — M546 Pain in thoracic spine: Secondary | ICD-10-CM | POA: Diagnosis not present

## 2020-04-18 DIAGNOSIS — M9902 Segmental and somatic dysfunction of thoracic region: Secondary | ICD-10-CM | POA: Diagnosis not present

## 2020-04-18 DIAGNOSIS — M545 Low back pain: Secondary | ICD-10-CM | POA: Diagnosis not present

## 2020-04-19 ENCOUNTER — Encounter: Payer: Self-pay | Admitting: Internal Medicine

## 2020-04-19 ENCOUNTER — Other Ambulatory Visit: Payer: Self-pay

## 2020-04-19 ENCOUNTER — Ambulatory Visit
Admission: RE | Admit: 2020-04-19 | Discharge: 2020-04-19 | Disposition: A | Discharge status: Home / Self Care | Payer: 59 | Source: Ambulatory Visit | Attending: Internal Medicine | Admitting: Internal Medicine

## 2020-04-19 DIAGNOSIS — M4316 Spondylolisthesis, lumbar region: Secondary | ICD-10-CM | POA: Diagnosis not present

## 2020-04-19 DIAGNOSIS — M5416 Radiculopathy, lumbar region: Secondary | ICD-10-CM

## 2020-04-19 DIAGNOSIS — M5126 Other intervertebral disc displacement, lumbar region: Secondary | ICD-10-CM | POA: Diagnosis not present

## 2020-04-20 ENCOUNTER — Encounter: Payer: Self-pay | Admitting: Internal Medicine

## 2020-04-20 ENCOUNTER — Other Ambulatory Visit: Payer: Self-pay

## 2020-04-20 DIAGNOSIS — M546 Pain in thoracic spine: Secondary | ICD-10-CM | POA: Diagnosis not present

## 2020-04-20 DIAGNOSIS — M9903 Segmental and somatic dysfunction of lumbar region: Secondary | ICD-10-CM | POA: Diagnosis not present

## 2020-04-20 DIAGNOSIS — M545 Low back pain: Secondary | ICD-10-CM | POA: Diagnosis not present

## 2020-04-20 DIAGNOSIS — M9902 Segmental and somatic dysfunction of thoracic region: Secondary | ICD-10-CM | POA: Diagnosis not present

## 2020-04-20 MED ORDER — GABAPENTIN 100 MG PO CAPS: 300.0000 mg | ORAL_CAPSULE | Freq: Three times a day (TID) | ORAL | 1 refills | Status: DC

## 2020-04-27 DIAGNOSIS — M9902 Segmental and somatic dysfunction of thoracic region: Secondary | ICD-10-CM | POA: Diagnosis not present

## 2020-04-27 DIAGNOSIS — M9903 Segmental and somatic dysfunction of lumbar region: Secondary | ICD-10-CM | POA: Diagnosis not present

## 2020-04-27 DIAGNOSIS — M546 Pain in thoracic spine: Secondary | ICD-10-CM | POA: Diagnosis not present

## 2020-04-27 DIAGNOSIS — M545 Low back pain: Secondary | ICD-10-CM | POA: Diagnosis not present

## 2020-05-12 ENCOUNTER — Other Ambulatory Visit: Payer: Self-pay | Admitting: Internal Medicine

## 2020-05-12 DIAGNOSIS — Z1231 Encounter for screening mammogram for malignant neoplasm of breast: Secondary | ICD-10-CM

## 2020-05-18 ENCOUNTER — Other Ambulatory Visit (HOSPITAL_COMMUNITY): Payer: Self-pay | Admitting: Gynecology

## 2020-05-18 DIAGNOSIS — Z1389 Encounter for screening for other disorder: Secondary | ICD-10-CM | POA: Diagnosis not present

## 2020-05-18 DIAGNOSIS — Z01419 Encounter for gynecological examination (general) (routine) without abnormal findings: Secondary | ICD-10-CM | POA: Diagnosis not present

## 2020-05-18 DIAGNOSIS — Z6835 Body mass index (BMI) 35.0-35.9, adult: Secondary | ICD-10-CM | POA: Diagnosis not present

## 2020-05-18 DIAGNOSIS — Z79890 Hormone replacement therapy (postmenopausal): Secondary | ICD-10-CM | POA: Diagnosis not present

## 2020-05-18 DIAGNOSIS — Z780 Asymptomatic menopausal state: Secondary | ICD-10-CM | POA: Diagnosis not present

## 2020-05-18 DIAGNOSIS — Z130 Encounter for screening for diseases of the blood and blood-forming organs and certain disorders involving the immune mechanism: Secondary | ICD-10-CM | POA: Diagnosis not present

## 2020-05-22 DIAGNOSIS — Z1283 Encounter for screening for malignant neoplasm of skin: Secondary | ICD-10-CM | POA: Diagnosis not present

## 2020-05-22 DIAGNOSIS — D225 Melanocytic nevi of trunk: Secondary | ICD-10-CM | POA: Diagnosis not present

## 2020-06-01 DIAGNOSIS — D23122 Other benign neoplasm of skin of left lower eyelid, including canthus: Secondary | ICD-10-CM | POA: Diagnosis not present

## 2020-06-05 ENCOUNTER — Other Ambulatory Visit: Payer: Self-pay | Admitting: Endocrinology

## 2020-06-05 DIAGNOSIS — E049 Nontoxic goiter, unspecified: Secondary | ICD-10-CM

## 2020-06-05 DIAGNOSIS — E890 Postprocedural hypothyroidism: Secondary | ICD-10-CM | POA: Diagnosis not present

## 2020-06-05 DIAGNOSIS — E012 Iodine-deficiency related (endemic) goiter, unspecified: Secondary | ICD-10-CM | POA: Diagnosis not present

## 2020-06-05 DIAGNOSIS — E7800 Pure hypercholesterolemia, unspecified: Secondary | ICD-10-CM | POA: Diagnosis not present

## 2020-06-05 DIAGNOSIS — K7689 Other specified diseases of liver: Secondary | ICD-10-CM | POA: Diagnosis not present

## 2020-06-05 DIAGNOSIS — I10 Essential (primary) hypertension: Secondary | ICD-10-CM | POA: Diagnosis not present

## 2020-06-05 DIAGNOSIS — E559 Vitamin D deficiency, unspecified: Secondary | ICD-10-CM | POA: Diagnosis not present

## 2020-06-05 DIAGNOSIS — R7301 Impaired fasting glucose: Secondary | ICD-10-CM | POA: Diagnosis not present

## 2020-06-08 ENCOUNTER — Ambulatory Visit
Admission: RE | Admit: 2020-06-08 | Discharge: 2020-06-08 | Disposition: A | Discharge status: Home / Self Care | Payer: 59 | Source: Ambulatory Visit | Attending: Internal Medicine | Admitting: Internal Medicine

## 2020-06-08 ENCOUNTER — Other Ambulatory Visit: Payer: Self-pay

## 2020-06-08 DIAGNOSIS — Z1231 Encounter for screening mammogram for malignant neoplasm of breast: Secondary | ICD-10-CM

## 2020-06-15 ENCOUNTER — Ambulatory Visit
Admission: RE | Admit: 2020-06-15 | Discharge: 2020-06-15 | Disposition: A | Discharge status: Home / Self Care | Payer: 59 | Source: Ambulatory Visit | Attending: Endocrinology | Admitting: Endocrinology

## 2020-06-15 ENCOUNTER — Other Ambulatory Visit: Payer: Self-pay

## 2020-06-15 DIAGNOSIS — E041 Nontoxic single thyroid nodule: Secondary | ICD-10-CM | POA: Diagnosis not present

## 2020-06-15 DIAGNOSIS — E049 Nontoxic goiter, unspecified: Secondary | ICD-10-CM

## 2020-06-23 DIAGNOSIS — H0012 Chalazion right lower eyelid: Secondary | ICD-10-CM | POA: Diagnosis not present

## 2020-06-27 ENCOUNTER — Other Ambulatory Visit: Payer: Self-pay | Admitting: Internal Medicine

## 2020-06-27 DIAGNOSIS — H0012 Chalazion right lower eyelid: Secondary | ICD-10-CM | POA: Diagnosis not present

## 2020-07-18 NOTE — Progress Notes (Signed)
Subjective:    Patient ID: Joan [name], female    DOB: Nov 27, 1955, 64 y.o.   MRN: [id number]  HPI She is here for a physical exam.   She has no concerns.  She is having increased frustration at work, but no anxiety or depression.    Medications and allergies reviewed with patient and updated if appropriate.  Patient Active Problem List   Diagnosis Date Noted  . Lumbar radiculopathy 03/28/2020  . SOB (shortness of breath) 01/23/2019  . 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 Osteopenia 08/19/2018  . Hyperglycemia 07/09/2018  . Hypothyroidism 06/30/2017  . Gallbladder polyp 05/20/2016  . Fatty liver 05/20/2016  . Obesity 05/01/2016  . Vitamin D deficiency 07/14/2013  . LOW BACK PAIN SYNDROME 01/23/2010  . Status post radioactive iodine thyroid ablation 08/17/2009  . Hyperlipidemia 07/26/2008  . Essential hypertension 07/26/2008  . Pain in joint of left shoulder 07/26/2008  . Goiter 07/14/2007    Current Outpatient Medications on File Prior to Visit  Medication Sig Dispense Refill  . amLODipine-benazepril (LOTREL) 5-20 MG capsule TAKE 1 CAPSULE BY MOUTH ONCE DAILY 90 capsule 0  . Cholecalciferol (VITAMIN D3) 2000 UNITS TABS Take by mouth daily.    Marland Kitchen estradiol (VIVELLE-DOT) 0.025 MG/24HR Place 1 patch onto the skin 2 (two) times a week.      . levothyroxine (SYNTHROID, LEVOTHROID) 100 MCG tablet Take 100 mcg by mouth daily before breakfast.    . Loratadine (CLARITIN PO) Take by mouth daily.    Marland Kitchen triamterene-hydrochlorothiazide (DYAZIDE) 37.5-25 MG capsule TAKE 1 CAPSULE BY MOUTH ONCE DAILY 90 capsule 0   No current facility-administered medications on file prior to visit.    Past Medical History:  Diagnosis Date  . Allergy   . Anemia    due to Dysmenorrhea  . Arthritis    generalized   . Hypertension    initially after BCP initiated   . Thyroid disease 2005 & 2009   nodule    Past Surgical History:  Procedure Laterality Date  . ABDOMINAL HYSTERECTOMY   1998   for heavy menses and fibroids  . BIOPSY THYROID  2005,2009   both negative  . CESAREAN SECTION     x2  . COLONOSCOPY  2009    Dr [facility]  . EXPLORATORY LAPAROTOMY  1985   Ovarian cyst  . FLEXIBLE SIGMOIDOSCOPY  1985    LLQ pain due to ovarian cyst  . SIGMOIDOSCOPY    . TONSILLECTOMY      Social History   Socioeconomic History  . Marital status: Married    Spouse name: Not on file  . Number of children: Not on file  . Years of education: Not on file  . Highest education level: Not on file  Occupational History  . Not on file  Tobacco Use  . Smoking status: Never Smoker  . Smokeless tobacco: Never Used  Substance and Sexual Activity  . Alcohol use: No  . Drug use: No  . Sexual activity: Not Currently  Other Topics Concern  . 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 Difficulty of Paying Living Expenses: Not on file  Food Insecurity:   . Worried About Charity fundraiser in the Last Year: Not on file  . Ran Out of Food in the Last Year: Not on  file  Transportation Needs:   . Film/video editor (Medical): Not on file  . Lack of Transportation (Non-Medical): Not on file  Physical Activity:   . Days of Exercise per Week: Not on file  . Minutes of Exercise per Session: Not on file  Stress:   . Feeling of Stress : Not on file  Social Connections:   . Frequency of Communication with Friends and Family: Not on file  . Frequency of Social Gatherings with Friends and Family: Not on file  . Attends Religious Services: Not on file  . Active Member of Clubs or Organizations: Not on file  . Attends Archivist Meetings: Not on file  . Marital Status: Not on file    Family History  Problem Relation Age of Onset  . Hypertension Father   . Kidney failure Father        Bright's Disease; died @ 79  . Hypertension Mother   .  Hyperlipidemia Mother   . Osteoarthritis Mother   . Hypertension Brother   . Diabetes Brother   . Stroke Maternal Grandmother        in 15s  . Hypertension Maternal Aunt        also DJD/ OA  . Breast cancer Maternal Aunt   . Heart attack Maternal Grandfather 69       also  CVA  . Heart block Paternal Grandmother        pacemaker  . Colon cancer Neg Hx   . Colon polyps Neg Hx   . Esophageal cancer Neg Hx   . Rectal cancer Neg Hx   . Stomach cancer Neg Hx     Review of Systems  Constitutional: Negative for chills and fever.  Eyes: Negative for visual disturbance.  Respiratory: Negative for cough, shortness of breath and wheezing.   Cardiovascular: Positive for leg swelling. Negative for chest pain and palpitations.  Gastrointestinal: Negative for abdominal pain, blood in stool, constipation, diarrhea and nausea.       No gerd  Genitourinary: Negative for dysuria and hematuria.  Musculoskeletal: Positive for arthralgias (mild). Negative for back pain.  Skin: Negative for rash.  Neurological: Negative for light-headedness and headaches.  Psychiatric/Behavioral: Negative for dysphoric mood. The patient is not nervous/anxious.        Objective:   Vitals:   07/19/20 0956  BP: 126/82  Pulse: 80  Temp: 98.1 F (36.7 C)  SpO2: 95%   Filed Weights   07/19/20 0956  Weight: 206 lb (93.4 kg)   Body mass index is 35.36 kg/m.  BP Readings from Last 3 Encounters:  07/19/20 126/82  03/28/20 136/80  07/15/19 130/84    Wt Readings from Last 3 Encounters:  07/19/20 206 lb (93.4 kg)  03/28/20 205 lb 12.8 oz (93.4 kg)  07/15/19 204 lb 9.6 oz (92.8 kg)     Physical Exam Constitutional: She appears well-developed and well-nourished. No distress.  HENT:  Head: Normocephalic and atraumatic.  Right Ear: External ear normal. Normal ear canal and TM Left Ear: External ear normal.  Normal ear canal and TM Mouth/Throat: Oropharynx is clear and moist.  Eyes: Conjunctivae and EOM  are normal.  Neck: Neck supple. No tracheal deviation present. No thyromegaly present.  No carotid bruit  Cardiovascular: Normal rate, regular rhythm and normal heart sounds.   No murmur heard.  No edema. Pulmonary/Chest: Effort normal and breath sounds normal. No respiratory distress. She has no wheezes. She has no rales.  Breast: deferred   Abdominal:  Soft. She exhibits no distension. There is no tenderness.  Lymphadenopathy: She has no cervical adenopathy.  Skin: Skin is warm and dry. She is not diaphoretic.  Psychiatric: She has a normal mood and affect. Her behavior is normal.   The 10-year ASCVD risk score Mikey Bussing DC Jr., et al., 2013) is: 9.1%   Values used to calculate the score:     Age: 37 years     Sex: Female     Is Non-Hispanic African American: No     Diabetic: No     Tobacco smoker: No     Systolic Blood Pressure: 022 mmHg     Is BP treated: Yes     HDL Cholesterol: 55.7 mg/dL     Total Cholesterol: 263 mg/dL      Assessment & Plan:   Physical exam: Screening blood work    ordered Immunizations  Pneumovax today, flu vaccine at work,  dicussed shingrix Colonoscopy  Up to date  Mammogram  Up to date  Gyn  Sees NP [facility]  Up to date  Eye exams  Up to date  Exercise   none Weight  Advised weight loss Substance abuse  none  See Problem List for Assessment and Plan of chronic medical problems.   This visit occurred during the SARS-CoV-2 public health emergency.  Safety protocols were in place, including screening questions prior to the visit, additional usage of staff PPE, and extensive cleaning of exam room while observing appropriate contact time as indicated for disinfecting solutions.   Follow up in 1 year

## 2020-07-18 NOTE — Patient Instructions (Addendum)
Blood work was ordered.    All other Health Maintenance issues reviewed.   All recommended immunizations and age-appropriate screenings are up-to-date or discussed.  Pneumovax immunization administered today.   Medications reviewed and updated.  Changes include :   none  Your prescription(s) have been submitted to your pharmacy. Please take as directed and contact our office if you believe you are having problem(s) with the medication(s).   Please followup in 1 year    Health Maintenance, Female Adopting a healthy lifestyle and getting preventive care are important in promoting health and wellness. Ask your health care provider about:  The right schedule for you to have regular tests and exams.  Things you can do on your own to prevent diseases and keep yourself healthy. What should I know about diet, weight, and exercise? Eat a healthy diet   Eat a diet that includes plenty of vegetables, fruits, low-fat dairy products, and lean protein.  Do not eat a lot of foods that are high in solid fats, added sugars, or sodium. Maintain a healthy weight Body mass index (BMI) is used to identify weight problems. It estimates body fat based on height and weight. Your health care provider can help determine your BMI and help you achieve or maintain a healthy weight. Get regular exercise Get regular exercise. This is one of the most important things you can do for your health. Most adults should:  Exercise for at least 150 minutes each week. The exercise should increase your heart rate and make you sweat (moderate-intensity exercise).  Do strengthening exercises at least twice a week. This is in addition to the moderate-intensity exercise.  Spend less time sitting. Even light physical activity can be beneficial. Watch cholesterol and blood lipids Have your blood tested for lipids and cholesterol at 66 years of age, then have this test every 5 years. Have your cholesterol levels checked  more often if:  Your lipid or cholesterol levels are high.  You are older than 66 years of age.  You are at high risk for heart disease. What should I know about cancer screening? Depending on your health history and family history, you may need to have cancer screening at various ages. This may include screening for:  Breast cancer.  Cervical cancer.  Colorectal cancer.  Skin cancer.  Lung cancer. What should I know about heart disease, diabetes, and high blood pressure? Blood pressure and heart disease  High blood pressure causes heart disease and increases the risk of stroke. This is more likely to develop in people who have high blood pressure readings, are of African descent, or are overweight.  Have your blood pressure checked: ? Every 3-5 years if you are 18-39 years of age. ? Every year if you are 40 years old or older. Diabetes Have regular diabetes screenings. This checks your fasting blood sugar level. Have the screening done:  Once every three years after age 40 if you are at a normal weight and have a low risk for diabetes.  More often and at a younger age if you are overweight or have a high risk for diabetes. What should I know about preventing infection? Hepatitis B If you have a higher risk for hepatitis B, you should be screened for this virus. Talk with your health care provider to find out if you are at risk for hepatitis B infection. Hepatitis C Testing is recommended for:  Everyone born from 1945 through 1965.  Anyone with known risk factors for hepatitis C.   Sexually transmitted infections (STIs)  Get screened for STIs, including gonorrhea and chlamydia, if: ? You are sexually active and are younger than 66 years of age. ? You are older than 66 years of age and your health care provider tells you that you are at risk for this type of infection. ? Your sexual activity has changed since you were last screened, and you are at increased risk for  chlamydia or gonorrhea. Ask your health care provider if you are at risk.  Ask your health care provider about whether you are at high risk for HIV. Your health care provider may recommend a prescription medicine to help prevent HIV infection. If you choose to take medicine to prevent HIV, you should first get tested for HIV. You should then be tested every 3 months for as long as you are taking the medicine. Pregnancy  If you are about to stop having your period (premenopausal) and you may become pregnant, seek counseling before you get pregnant.  Take 400 to 800 micrograms (mcg) of folic acid every day if you become pregnant.  Ask for birth control (contraception) if you want to prevent pregnancy. Osteoporosis and menopause Osteoporosis is a disease in which the bones lose minerals and strength with aging. This can result in bone fractures. If you are 65 years old or older, or if you are at risk for osteoporosis and fractures, ask your health care provider if you should:  Be screened for bone loss.  Take a calcium or vitamin D supplement to lower your risk of fractures.  Be given hormone replacement therapy (HRT) to treat symptoms of menopause. Follow these instructions at home: Lifestyle  Do not use any products that contain nicotine or tobacco, such as cigarettes, e-cigarettes, and chewing tobacco. If you need help quitting, ask your health care provider.  Do not use street drugs.  Do not share needles.  Ask your health care provider for help if you need support or information about quitting drugs. Alcohol use  Do not drink alcohol if: ? Your health care provider tells you not to drink. ? You are pregnant, may be pregnant, or are planning to become pregnant.  If you drink alcohol: ? Limit how much you use to 0-1 drink a day. ? Limit intake if you are breastfeeding.  Be aware of how much alcohol is in your drink. In the [place] one drink equals one 12 oz bottle of beer (355  mL), one 5 oz glass of wine (148 mL), or one 1 oz glass of hard liquor (44 mL). General instructions  Schedule regular health, dental, and eye exams.  Stay current with your vaccines.  Tell your health care provider if: ? You often feel depressed. ? You have ever been abused or do not feel safe at home. Summary  Adopting a healthy lifestyle and getting preventive care are important in promoting health and wellness.  Follow your health care provider's instructions about healthy diet, exercising, and getting tested or screened for diseases.  Follow your health care provider's instructions on monitoring your cholesterol and blood pressure. This information is not intended to replace advice given to you by your health care provider. Make sure you discuss any questions you have with your health care provider. Document Revised: 11/04/2018 Document Reviewed: 11/04/2018 Elsevier Patient Education  2020 Elsevier Inc.  

## 2020-07-19 ENCOUNTER — Other Ambulatory Visit: Payer: Self-pay

## 2020-07-19 ENCOUNTER — Encounter: Payer: Self-pay | Admitting: Internal Medicine

## 2020-07-19 ENCOUNTER — Ambulatory Visit (INDEPENDENT_AMBULATORY_CARE_PROVIDER_SITE_OTHER): Payer: 59 | Admitting: Internal Medicine

## 2020-07-19 VITALS — BP 126/82 | HR 80 | Temp 98.1°F | Ht 64.0 in | Wt 206.0 lb

## 2020-07-19 DIAGNOSIS — Z23 Encounter for immunization: Secondary | ICD-10-CM

## 2020-07-19 DIAGNOSIS — I10 Essential (primary) hypertension: Secondary | ICD-10-CM | POA: Diagnosis not present

## 2020-07-19 DIAGNOSIS — E6609 Other obesity due to excess calories: Secondary | ICD-10-CM | POA: Diagnosis not present

## 2020-07-19 DIAGNOSIS — E559 Vitamin D deficiency, unspecified: Secondary | ICD-10-CM

## 2020-07-19 DIAGNOSIS — M85852 Other specified disorders of bone density and structure, left thigh: Secondary | ICD-10-CM | POA: Diagnosis not present

## 2020-07-19 DIAGNOSIS — E890 Postprocedural hypothyroidism: Secondary | ICD-10-CM

## 2020-07-19 DIAGNOSIS — E782 Mixed hyperlipidemia: Secondary | ICD-10-CM

## 2020-07-19 DIAGNOSIS — R739 Hyperglycemia, unspecified: Secondary | ICD-10-CM

## 2020-07-19 DIAGNOSIS — Z0000 Encounter for general adult medical examination without abnormal findings: Secondary | ICD-10-CM

## 2020-07-19 DIAGNOSIS — M5416 Radiculopathy, lumbar region: Secondary | ICD-10-CM

## 2020-07-19 DIAGNOSIS — Z6834 Body mass index (BMI) 34.0-34.9, adult: Secondary | ICD-10-CM

## 2020-07-19 LAB — COMPREHENSIVE METABOLIC PANEL
AG Ratio: 2.2 (calc) (ref 1.0–2.5)
ALT: 26 U/L (ref 6–29)
AST: 17 U/L (ref 10–35)
Albumin: 4.7 g/dL (ref 3.6–5.1)
Alkaline phosphatase (APISO): 84 U/L (ref 37–153)
BUN: 13 mg/dL (ref 7–25)
CO2: 28 mmol/L (ref 20–32)
Calcium: 9.4 mg/dL (ref 8.6–10.4)
Chloride: 100 mmol/L (ref 98–110)
Creat: 0.67 mg/dL (ref 0.50–0.99)
Globulin: 2.1 g/dL (calc) (ref 1.9–3.7)
Glucose, Bld: 93 mg/dL (ref 65–99)
Potassium: 4 mmol/L (ref 3.5–5.3)
Sodium: 138 mmol/L (ref 135–146)
Total Bilirubin: 0.4 mg/dL (ref 0.2–1.2)
Total Protein: 6.8 g/dL (ref 6.1–8.1)

## 2020-07-19 LAB — CBC WITH DIFFERENTIAL/PLATELET
Absolute Monocytes: 563 cells/uL (ref 200–950)
Basophils Absolute: 60 cells/uL (ref 0–200)
Basophils Relative: 0.8 %
Eosinophils Absolute: 83 cells/uL (ref 15–500)
Eosinophils Relative: 1.1 %
HCT: 43 % (ref 35.0–45.0)
Hemoglobin: 14.7 g/dL (ref 11.7–15.5)
Lymphs Abs: 2228 cells/uL (ref 850–3900)
MCH: 32.4 pg (ref 27.0–33.0)
MCHC: 34.2 g/dL (ref 32.0–36.0)
MCV: 94.7 fL (ref 80.0–100.0)
MPV: 10.4 fL (ref 7.5–12.5)
Monocytes Relative: 7.5 %
Neutro Abs: 4568 cells/uL (ref 1500–7800)
Neutrophils Relative %: 60.9 %
Platelets: 250 10*3/uL (ref 140–400)
RBC: 4.54 10*6/uL (ref 3.80–5.10)
RDW: 12.8 % (ref 11.0–15.0)
Total Lymphocyte: 29.7 %
WBC: 7.5 10*3/uL (ref 3.8–10.8)

## 2020-07-19 LAB — LIPID PANEL
Cholesterol: 251 mg/dL — ABNORMAL HIGH (ref <200)
HDL: 57 mg/dL (ref >=50)
LDL Cholesterol (Calc): 163 mg/dL (calc) — ABNORMAL HIGH
Non-HDL Cholesterol (Calc): 194 mg/dL (calc) — ABNORMAL HIGH (ref <130)
Total CHOL/HDL Ratio: 4.4 (calc) (ref <5.0)
Triglycerides: 162 mg/dL — ABNORMAL HIGH (ref <150)

## 2020-07-19 NOTE — Assessment & Plan Note (Signed)
Chronic Always trying to lose weight Increase exercise, dec portions and sugar/carbs

## 2020-07-19 NOTE — Assessment & Plan Note (Signed)
chronic dexa up to date Active - encouraged regular exercise Taking vitamin d - level checked by Dr Chalmers Cater and was good

## 2020-07-19 NOTE — Assessment & Plan Note (Addendum)
Chronic BP well controlled Current regimen effective and well tolerated Continue current medications at current doses cmp  EKG today:  NSR at 78 bpm, low voltage likely related to body habitus, poor R wave progression - no change compared to EKG from 2018  At some point we can try changing dyazide to hctz, but at this point we will wait

## 2020-07-19 NOTE — Assessment & Plan Note (Signed)
Resolved with PT 

## 2020-07-19 NOTE — Assessment & Plan Note (Signed)
Chronic  Clinically euthyroid Management per Dr. Balan 

## 2020-07-19 NOTE — Assessment & Plan Note (Signed)
Chronic Check a1c Low sugar / carb diet Stressed regular exercise  

## 2020-07-19 NOTE — Assessment & Plan Note (Signed)
Chronic Check lipid panel  Regular exercise and healthy diet encouraged  Will recheck ASCVD risk score after labs

## 2020-07-19 NOTE — Assessment & Plan Note (Signed)
Chronic Taking vitamin d Vitamin d checked and ok with Dr Chalmers Cater

## 2020-07-20 LAB — HEMOGLOBIN A1C
Hgb A1c MFr Bld: 5.5 % of total Hgb (ref <5.7)
Mean Plasma Glucose: 111 (calc)
eAG (mmol/L): 6.2 (calc)

## 2020-07-25 DIAGNOSIS — H0012 Chalazion right lower eyelid: Secondary | ICD-10-CM | POA: Diagnosis not present

## 2020-09-19 DIAGNOSIS — H524 Presbyopia: Secondary | ICD-10-CM | POA: Diagnosis not present

## 2020-10-02 ENCOUNTER — Other Ambulatory Visit: Payer: Self-pay | Admitting: Internal Medicine

## 2020-10-18 DIAGNOSIS — M65862 Other synovitis and tenosynovitis, left lower leg: Secondary | ICD-10-CM | POA: Diagnosis not present

## 2020-11-10 ENCOUNTER — Other Ambulatory Visit: Payer: Self-pay

## 2020-11-10 ENCOUNTER — Other Ambulatory Visit: Payer: Self-pay | Admitting: Family Medicine

## 2020-11-10 ENCOUNTER — Ambulatory Visit
Admission: RE | Admit: 2020-11-10 | Discharge: 2020-11-10 | Disposition: A | Discharge status: Home / Self Care | Payer: 59 | Source: Ambulatory Visit | Attending: Orthopedic Surgery | Admitting: Orthopedic Surgery

## 2020-11-10 ENCOUNTER — Other Ambulatory Visit: Payer: Self-pay | Admitting: Orthopedic Surgery

## 2020-11-10 DIAGNOSIS — M25562 Pain in left knee: Secondary | ICD-10-CM

## 2020-11-10 DIAGNOSIS — M23322 Other meniscus derangements, posterior horn of medial meniscus, left knee: Secondary | ICD-10-CM | POA: Diagnosis not present

## 2020-12-01 DIAGNOSIS — S83241A Other tear of medial meniscus, current injury, right knee, initial encounter: Secondary | ICD-10-CM | POA: Diagnosis not present

## 2020-12-01 DIAGNOSIS — M25562 Pain in left knee: Secondary | ICD-10-CM | POA: Diagnosis not present

## 2021-01-01 ENCOUNTER — Other Ambulatory Visit (HOSPITAL_COMMUNITY): Payer: Self-pay | Admitting: Endocrinology

## 2021-01-01 ENCOUNTER — Other Ambulatory Visit: Payer: Self-pay | Admitting: Internal Medicine

## 2021-01-02 ENCOUNTER — Other Ambulatory Visit: Payer: Self-pay | Admitting: Internal Medicine

## 2021-01-14 IMAGING — MR MR LUMBAR SPINE W/O CM
4 of 5 series · 18 of 48 positions shown · non-contrast
Comparison: None.

CLINICAL DATA: Low back pain with right lower extremity
radiculopathy. Recent fall.

EXAM:
MRI LUMBAR SPINE WITHOUT CONTRAST
TECHNIQUE: Multiplanar, multisequence MR imaging of the lumbar spine was
performed. No intravenous contrast was administered.

[Series 5: T2 · sagittal · 4.0mm · 0.73mm/px · 6 of 15 slices shown (1 of 2)]
[im 1/15]
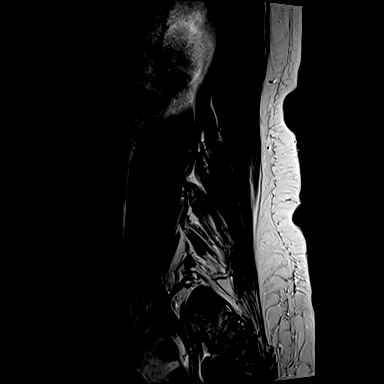
[im 3/15]
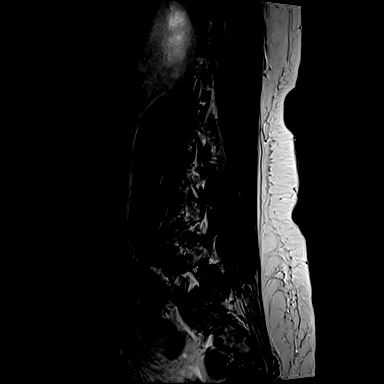
[im 6/15]
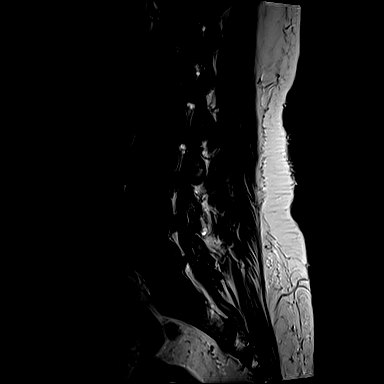
[im 9/15]
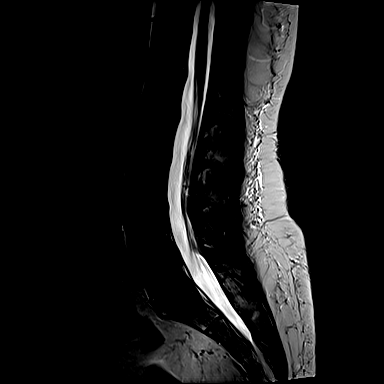
[im 12/15]
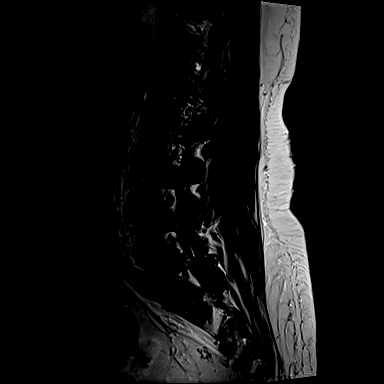
[im 15/15]
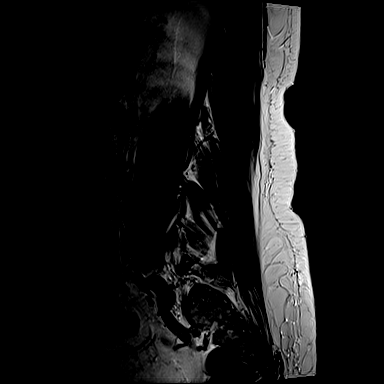

[Series 6: T1 · sagittal · 4.0mm · 0.73mm/px · 3 of 15 slices shown (1 of 2)]
[im 3/15]
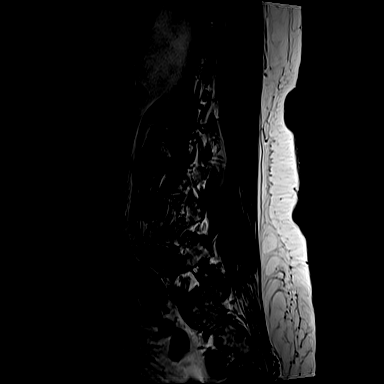
[im 9/15]
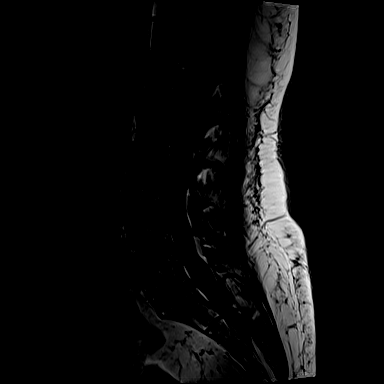
[im 15/15]
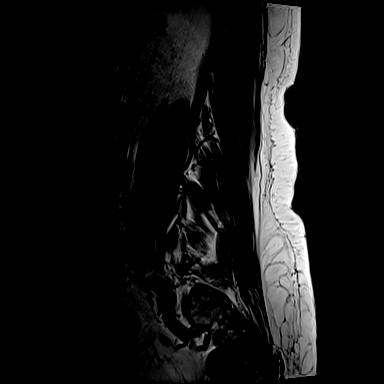

[Series 10: T1 · axial · 4.0mm · 0.28mm/px · z∈[-150,-10]mm · 3 of 39 slices shown (2 of 2)]
[im 6/39]
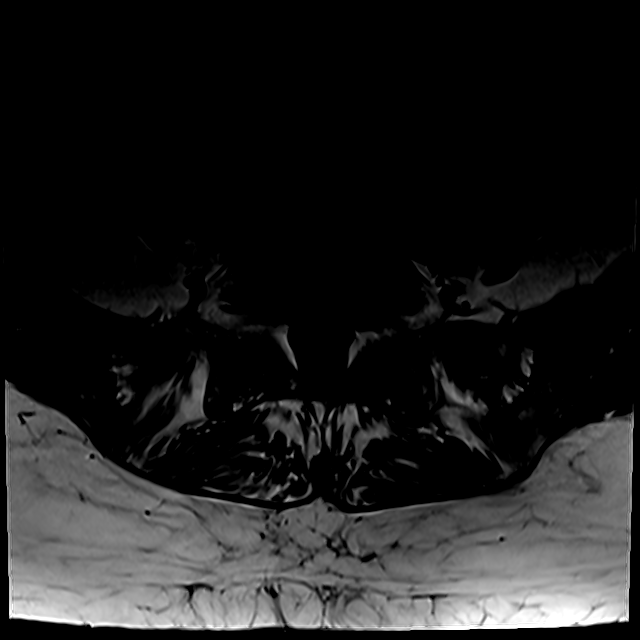
[im 20/39]
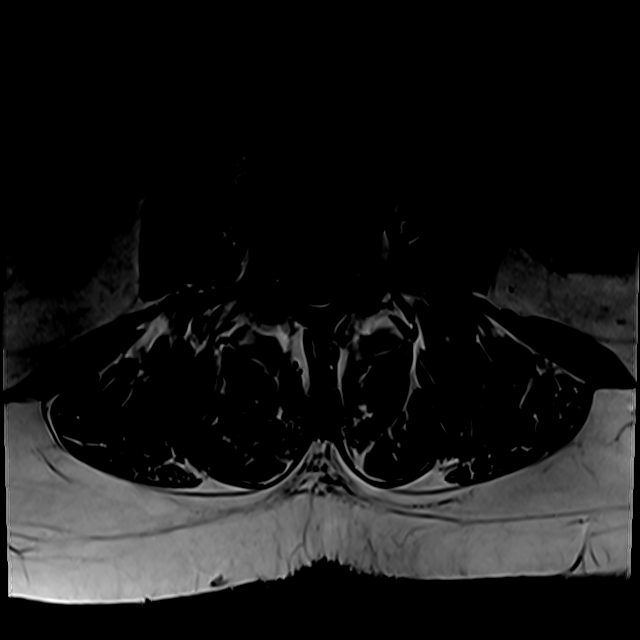
[im 33/39]
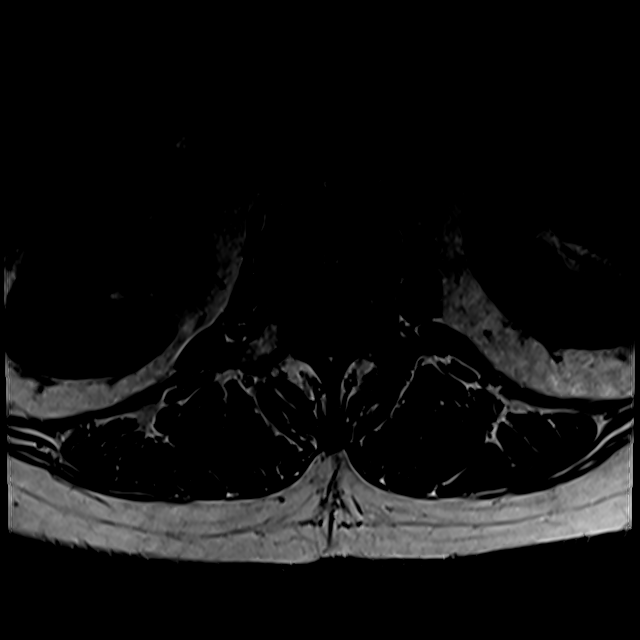

[Series 13: T2 · axial · 4.0mm · 0.28mm/px · z∈[-175,-10]mm · 6 of 39 slices shown (2 of 2)]
[im 1/39]
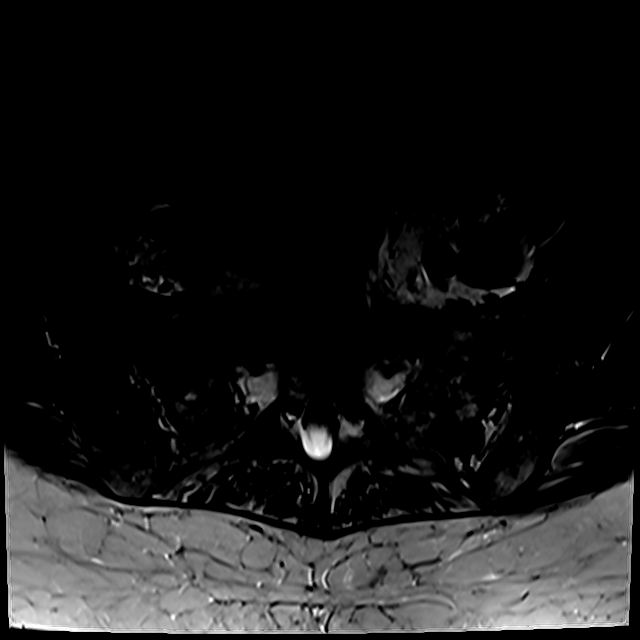
[im 6/39]
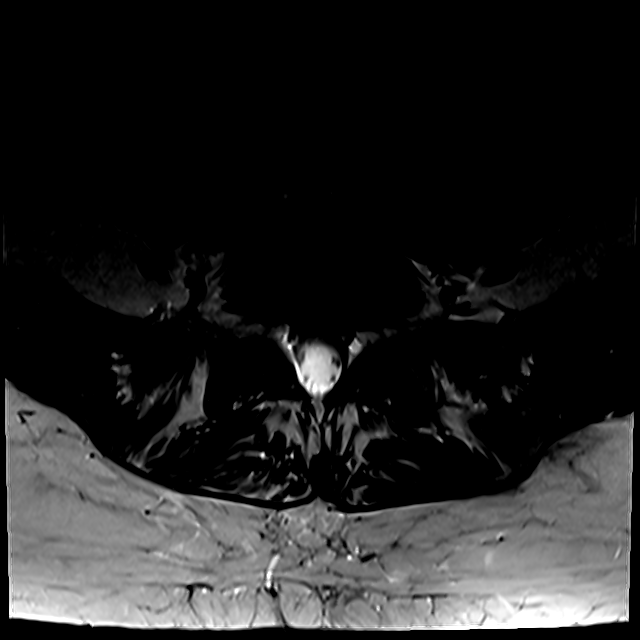
[im 11/39]
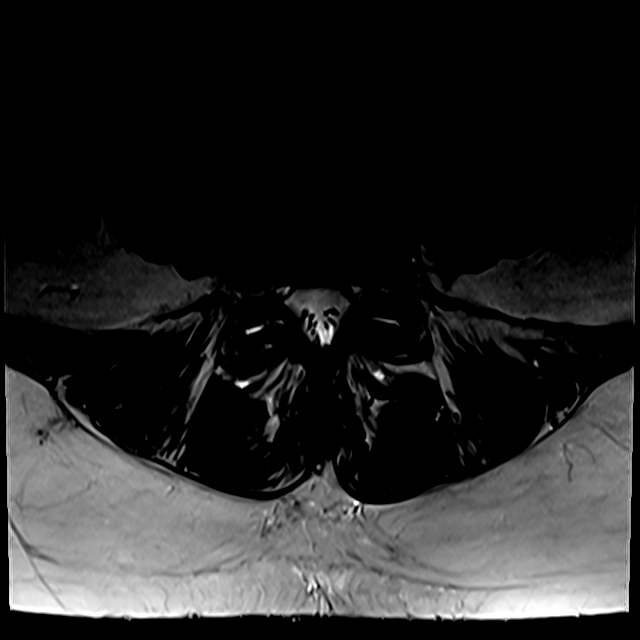
[im 17/39]
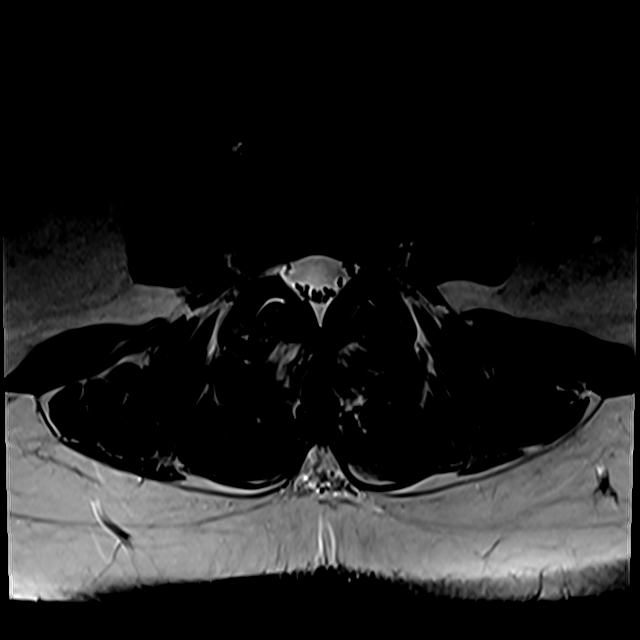
[im 20/39]
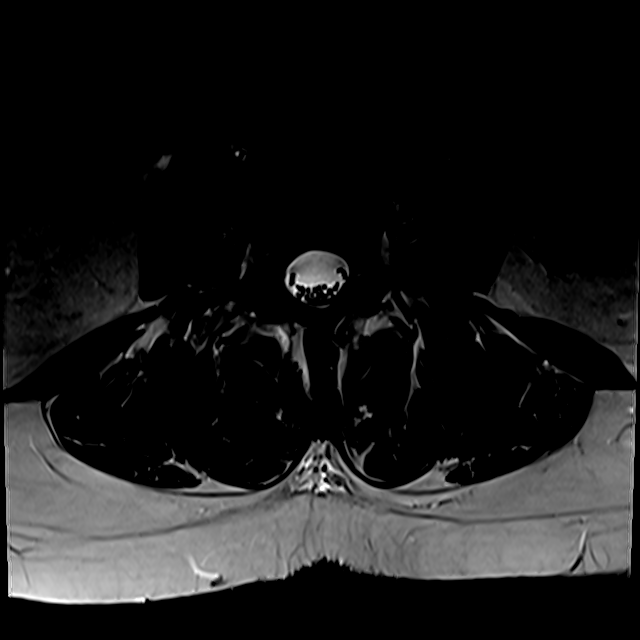
[im 33/39]
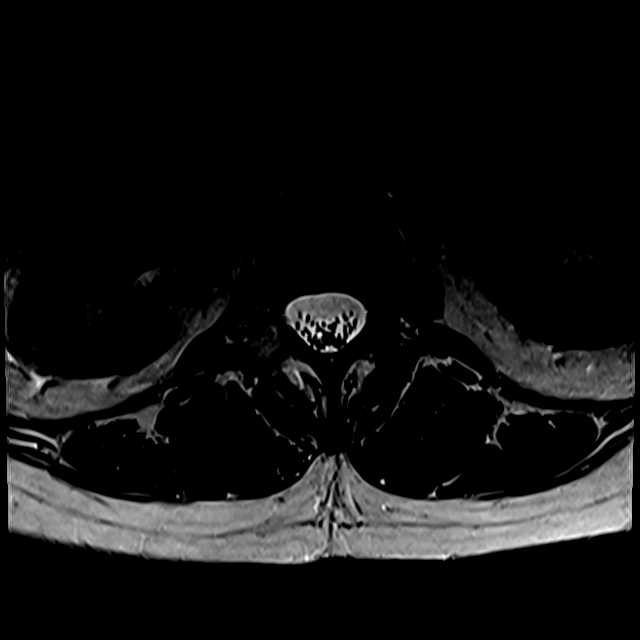

[18 of 48 positions shown; findings below may reference images not displayed]

FINDINGS: Segmentation:  5 lumbar type vertebra.

Alignment:  2.5 mm spondylolisthesis at L4-5. Otherwise physiologic.

Vertebrae:  No fracture, evidence of discitis, or bone lesion.

Conus medullaris and cauda equina: Conus extends to the L1-2 level.
Conus and cauda equina appear normal.

Paraspinal and other soft tissues: Negative.

Disc levels:

T12-L1: Normal.

L1-2: Tiny disc bulge to the right of midline without neural
impingement. Otherwise negative.

L2-3:a focal disc bulge into the right lateral recess and right
neural foramen without neural impingement. Otherwise negative.

L3-4: Focal disc protrusion into the right neural foramen with an
adjacent annular fissure. This is best seen on image 21 of series
10. It is not appreciable on the sagittal images. This should affect
the right L3 nerve.

L4-5: 2.5 mm spondylolisthesis secondary to severe bilateral facet
arthritis. No disc bulging or protrusion. No foraminal or spinal
stenosis.

L5-S1: No disc bulging or protrusion. No foraminal stenosis. Slight
degenerative changes of the facet joints.
IMPRESSION: 1. Focal disc protrusion into the right neural foramen at L3-4 with
an adjacent annular fissure. This soft disc protrusion should affect
the right L3 nerve.
2. Severe bilateral facet arthritis at L4-5 with grade 1
spondylolisthesis.

## 2021-02-15 DIAGNOSIS — M1712 Unilateral primary osteoarthritis, left knee: Secondary | ICD-10-CM | POA: Diagnosis not present

## 2021-02-15 DIAGNOSIS — M25562 Pain in left knee: Secondary | ICD-10-CM | POA: Diagnosis not present

## 2021-02-26 ENCOUNTER — Telehealth: Payer: Self-pay | Admitting: Internal Medicine

## 2021-02-26 NOTE — Telephone Encounter (Signed)
   Patient requesting medication (patch) for motion sickness , she has an upcoming cruise in 2 weeks   [place]

## 2021-02-27 ENCOUNTER — Other Ambulatory Visit (HOSPITAL_BASED_OUTPATIENT_CLINIC_OR_DEPARTMENT_OTHER): Payer: Self-pay

## 2021-02-27 ENCOUNTER — Other Ambulatory Visit (HOSPITAL_COMMUNITY): Payer: Self-pay

## 2021-02-27 ENCOUNTER — Other Ambulatory Visit: Payer: Self-pay

## 2021-02-27 MED ORDER — SCOPOLAMINE 1 MG/3DAYS TD PT72
1.0000 | MEDICATED_PATCH | TRANSDERMAL | 0 refills | Status: DC
  Filled 2021-02-27: qty 4, 12d supply, fill #0

## 2021-02-27 MED ORDER — VITAMIN D (ERGOCALCIFEROL) 1.25 MG (50000 UNIT) PO CAPS
ORAL_CAPSULE | ORAL | 4 refills | Status: DC
  Filled 2021-02-27: qty 12, 84d supply, fill #0
  Filled 2021-06-01: qty 12, 84d supply, fill #1
  Filled 2021-08-27: qty 12, 84d supply, fill #2
  Filled 2021-11-22: qty 12, 84d supply, fill #3
  Filled 2022-02-15: qty 12, 84d supply, fill #4

## 2021-02-28 ENCOUNTER — Other Ambulatory Visit (HOSPITAL_COMMUNITY): Payer: Self-pay

## 2021-03-27 ENCOUNTER — Other Ambulatory Visit (HOSPITAL_COMMUNITY): Payer: Self-pay

## 2021-03-27 ENCOUNTER — Other Ambulatory Visit: Payer: Self-pay | Admitting: Internal Medicine

## 2021-03-27 MED ORDER — AMLODIPINE BESY-BENAZEPRIL HCL 5-20 MG PO CAPS
1.0000 | ORAL_CAPSULE | Freq: Every day | ORAL | 0 refills | Status: DC
  Filled 2021-03-27: qty 90, 90d supply, fill #0

## 2021-03-27 MED ORDER — TRIAMTERENE-HCTZ 37.5-25 MG PO CAPS
1.0000 | ORAL_CAPSULE | Freq: Every day | ORAL | 0 refills | Status: DC
  Filled 2021-03-27: qty 90, 90d supply, fill #0

## 2021-03-27 MED FILL — Levothyroxine Sodium Tab 100 MCG: ORAL | 90 days supply | Qty: 90 | Fill #0 | Status: AC

## 2021-03-29 ENCOUNTER — Other Ambulatory Visit (HOSPITAL_COMMUNITY): Payer: Self-pay

## 2021-04-06 ENCOUNTER — Ambulatory Visit: Payer: 59 | Attending: Internal Medicine

## 2021-04-06 ENCOUNTER — Other Ambulatory Visit: Payer: Self-pay

## 2021-04-06 ENCOUNTER — Other Ambulatory Visit (HOSPITAL_BASED_OUTPATIENT_CLINIC_OR_DEPARTMENT_OTHER): Payer: Self-pay

## 2021-04-06 DIAGNOSIS — Z23 Encounter for immunization: Secondary | ICD-10-CM

## 2021-04-06 MED ORDER — PFIZER-BIONT COVID-19 VAC-TRIS 30 MCG/0.3ML IM SUSP
INTRAMUSCULAR | 0 refills | Status: DC
  Filled 2021-04-06: qty 0.3, 1d supply, fill #0

## 2021-04-06 NOTE — Progress Notes (Signed)
   Covid-19 Vaccination Clinic  Name:  Joan [name]    MRN: [id number] DOB: 10-28-1956  04/06/2021  Ms. Joan [name] was observed post Covid-19 immunization for 15 minutes without incident. She was provided with Vaccine Information Sheet and instruction to access the V-Safe system.   Ms. Joan [name] was instructed to call 911 with any severe reactions post vaccine: Marland Kitchen Difficulty breathing  . Swelling of face and throat  . A fast heartbeat  . A bad rash all over body  . Dizziness and weakness   Immunizations Administered    Name Date Dose VIS Date Route   PFIZER Comrnaty(Gray TOP) Covid-19 Vaccine 04/06/2021  9:11 AM 0.3 mL 11/02/2020 Intramuscular   Manufacturer: [employer]   Lot: [id number]   NDC: [phone]

## 2021-05-01 ENCOUNTER — Other Ambulatory Visit: Payer: Self-pay | Admitting: Internal Medicine

## 2021-05-01 DIAGNOSIS — Z1231 Encounter for screening mammogram for malignant neoplasm of breast: Secondary | ICD-10-CM

## 2021-05-23 ENCOUNTER — Other Ambulatory Visit (HOSPITAL_COMMUNITY): Payer: Self-pay

## 2021-05-23 DIAGNOSIS — Z780 Asymptomatic menopausal state: Secondary | ICD-10-CM | POA: Diagnosis not present

## 2021-05-23 DIAGNOSIS — Z79890 Hormone replacement therapy (postmenopausal): Secondary | ICD-10-CM | POA: Diagnosis not present

## 2021-05-23 DIAGNOSIS — Z6835 Body mass index (BMI) 35.0-35.9, adult: Secondary | ICD-10-CM | POA: Diagnosis not present

## 2021-05-23 DIAGNOSIS — Z01419 Encounter for gynecological examination (general) (routine) without abnormal findings: Secondary | ICD-10-CM | POA: Diagnosis not present

## 2021-05-23 DIAGNOSIS — Z130 Encounter for screening for diseases of the blood and blood-forming organs and certain disorders involving the immune mechanism: Secondary | ICD-10-CM | POA: Diagnosis not present

## 2021-05-23 DIAGNOSIS — Z1389 Encounter for screening for other disorder: Secondary | ICD-10-CM | POA: Diagnosis not present

## 2021-05-23 MED ORDER — HYDROCODONE-ACETAMINOPHEN 5-325 MG PO TABS
ORAL_TABLET | ORAL | 0 refills | Status: DC
  Filled 2021-05-23: qty 28, 7d supply, fill #0

## 2021-05-23 MED ORDER — ESTRADIOL 0.05 MG/24HR TD PTTW
MEDICATED_PATCH | TRANSDERMAL | 4 refills | Status: DC
  Filled 2021-05-23: qty 24, 84d supply, fill #0
  Filled 2021-10-25: qty 24, 84d supply, fill #1
  Filled 2022-01-22: qty 24, 84d supply, fill #2

## 2021-05-23 MED ORDER — METHOCARBAMOL 500 MG PO TABS
ORAL_TABLET | ORAL | 0 refills | Status: DC
  Filled 2021-05-23: qty 40, 10d supply, fill #0

## 2021-05-23 MED ORDER — ASPIRIN 81 MG PO TBEC
DELAYED_RELEASE_TABLET | ORAL | 0 refills | Status: DC
  Filled 2021-05-23: qty 21, 21d supply, fill #0

## 2021-05-23 MED ORDER — ONDANSETRON 4 MG PO TBDP
ORAL_TABLET | ORAL | 0 refills | Status: DC
  Filled 2021-05-23: qty 20, 5d supply, fill #0

## 2021-05-24 ENCOUNTER — Other Ambulatory Visit (HOSPITAL_COMMUNITY): Payer: Self-pay

## 2021-05-25 ENCOUNTER — Other Ambulatory Visit (HOSPITAL_COMMUNITY): Payer: Self-pay

## 2021-05-29 ENCOUNTER — Other Ambulatory Visit (HOSPITAL_COMMUNITY): Payer: Self-pay

## 2021-05-29 DIAGNOSIS — M94262 Chondromalacia, left knee: Secondary | ICD-10-CM | POA: Diagnosis not present

## 2021-05-29 DIAGNOSIS — M948X6 Other specified disorders of cartilage, lower leg: Secondary | ICD-10-CM | POA: Diagnosis not present

## 2021-05-29 DIAGNOSIS — G8918 Other acute postprocedural pain: Secondary | ICD-10-CM | POA: Diagnosis not present

## 2021-05-29 DIAGNOSIS — S83242A Other tear of medial meniscus, current injury, left knee, initial encounter: Secondary | ICD-10-CM | POA: Diagnosis not present

## 2021-05-29 DIAGNOSIS — M23322 Other meniscus derangements, posterior horn of medial meniscus, left knee: Secondary | ICD-10-CM | POA: Diagnosis not present

## 2021-05-30 ENCOUNTER — Other Ambulatory Visit (HOSPITAL_COMMUNITY): Payer: Self-pay

## 2021-06-01 ENCOUNTER — Other Ambulatory Visit (HOSPITAL_COMMUNITY): Payer: Self-pay

## 2021-06-05 DIAGNOSIS — E012 Iodine-deficiency related (endemic) goiter, unspecified: Secondary | ICD-10-CM | POA: Diagnosis not present

## 2021-06-05 DIAGNOSIS — R7301 Impaired fasting glucose: Secondary | ICD-10-CM | POA: Diagnosis not present

## 2021-06-05 DIAGNOSIS — I10 Essential (primary) hypertension: Secondary | ICD-10-CM | POA: Diagnosis not present

## 2021-06-05 DIAGNOSIS — E890 Postprocedural hypothyroidism: Secondary | ICD-10-CM | POA: Diagnosis not present

## 2021-06-05 DIAGNOSIS — E7800 Pure hypercholesterolemia, unspecified: Secondary | ICD-10-CM | POA: Diagnosis not present

## 2021-06-05 DIAGNOSIS — E559 Vitamin D deficiency, unspecified: Secondary | ICD-10-CM | POA: Diagnosis not present

## 2021-06-05 DIAGNOSIS — K7689 Other specified diseases of liver: Secondary | ICD-10-CM | POA: Diagnosis not present

## 2021-06-26 ENCOUNTER — Ambulatory Visit: Payer: 59

## 2021-06-26 ENCOUNTER — Other Ambulatory Visit (HOSPITAL_COMMUNITY): Payer: Self-pay

## 2021-06-26 ENCOUNTER — Other Ambulatory Visit: Payer: Self-pay | Admitting: Internal Medicine

## 2021-06-26 MED ORDER — AMLODIPINE BESY-BENAZEPRIL HCL 5-20 MG PO CAPS
1.0000 | ORAL_CAPSULE | Freq: Every day | ORAL | 0 refills | Status: DC
  Filled 2021-06-26: qty 30, 30d supply, fill #0

## 2021-06-26 MED ORDER — TRIAMTERENE-HCTZ 37.5-25 MG PO CAPS
1.0000 | ORAL_CAPSULE | Freq: Every day | ORAL | 0 refills | Status: DC
  Filled 2021-06-26: qty 30, 30d supply, fill #0

## 2021-06-26 MED FILL — Levothyroxine Sodium Tab 100 MCG: ORAL | 90 days supply | Qty: 90 | Fill #1 | Status: AC

## 2021-06-27 ENCOUNTER — Other Ambulatory Visit (HOSPITAL_COMMUNITY): Payer: Self-pay

## 2021-06-27 MED ORDER — CARESTART COVID-19 HOME TEST VI KIT
PACK | 0 refills | Status: DC
  Filled 2021-06-27: qty 4, 4d supply, fill #0

## 2021-07-05 ENCOUNTER — Other Ambulatory Visit (HOSPITAL_COMMUNITY): Payer: Self-pay

## 2021-07-09 ENCOUNTER — Other Ambulatory Visit: Payer: Self-pay

## 2021-07-09 ENCOUNTER — Ambulatory Visit: Payer: 59 | Attending: Orthopedic Surgery

## 2021-07-09 DIAGNOSIS — M545 Low back pain, unspecified: Secondary | ICD-10-CM | POA: Diagnosis not present

## 2021-07-09 DIAGNOSIS — M6281 Muscle weakness (generalized): Secondary | ICD-10-CM | POA: Insufficient documentation

## 2021-07-09 DIAGNOSIS — R252 Cramp and spasm: Secondary | ICD-10-CM | POA: Diagnosis not present

## 2021-07-09 DIAGNOSIS — R2689 Other abnormalities of gait and mobility: Secondary | ICD-10-CM | POA: Insufficient documentation

## 2021-07-09 DIAGNOSIS — M25562 Pain in left knee: Secondary | ICD-10-CM | POA: Diagnosis not present

## 2021-07-09 NOTE — Patient Instructions (Signed)
Access Code: [email or web address] URL: [email or web address] Date: 07/09/2021 Prepared by: Claiborne Billings  Exercises Supine Quad Set - 2 x daily - 7 x weekly - 2 sets - 10 reps - 5 hold Seated Long Arc Quad - 2 x daily - 7 x weekly - 2 sets - 10 reps - 5 hold Seated Hamstring Stretch - 3 x daily - 7 x weekly - 1 sets - 3 reps - 20 hold TL Sidebending Stretch - Single Arm Overhead - 3 x daily - 7 x weekly - 1 sets - 3 reps - 20 hold Supine Lower Trunk Rotation - 2 x daily - 7 x weekly - 1 sets - 3 reps - 20 hold

## 2021-07-09 NOTE — Therapy (Signed)
[facility] [address] [place], [name], [address] Phone: [phone]   Fax:  [phone]  Physical Therapy Evaluation  Patient Details  Name: Joan [name] MRN: [id number] Date of Birth: 04-04-1956 Referring Provider (PT): Fenton Foy, [place]   Encounter Date: 07/09/2021   PT End of Session - 07/09/21 1647     Visit Number 1    Date for PT Re-Evaluation 09/03/21    Authorization Type UMR- medical review after 25    Authorization - Visit Number 1    Authorization - Number of Visits 25    PT Start Time 1600    PT Stop Time [id number]    PT Time Calculation (min) 46 min    Activity Tolerance Patient tolerated treatment well    Behavior During Therapy [facility] for tasks assessed/performed             Past Medical History:  Diagnosis Date   Allergy    Anemia    due to Dysmenorrhea   Arthritis    generalized    Hypertension    initially after BCP initiated    Thyroid disease 2005 & 2009   nodule    Past Surgical History:  Procedure Laterality Date   ABDOMINAL HYSTERECTOMY  1998   for heavy menses and fibroids   BIOPSY THYROID  2005,2009   both negative   CESAREAN SECTION     x2   COLONOSCOPY  2009    Dr An[facility]   EXPLORATORY LAPAROTOMY  1985   Ovarian cyst   [place]    LLQ pain due to ovarian cyst   SIGMOIDOSCOPY     TONSILLECTOMY      There were no vitals filed for this visit.    Subjective Assessment - 07/09/21 1600     Subjective Pt presents to PT s/p Lt knee scope and menicus debridement performed on 05/29/21.  Pt also describes Rt sided LBP due to walking with a cane.  Pt has been biking at the [facility] per MD recommendations since surgery.    Pertinent History LT knee scope 05/29/21    Limitations Standing    How long can you stand comfortably? holding on to the wall or another object-1 hour with assymmetry    Patient Stated Goals improve Lt knee strength, improve  gait    Currently in Pain? Yes    Pain Score 5     Pain Location Knee    Pain Orientation Left    Pain Type Surgical pain    Pain Onset More than a month ago    Pain Frequency Intermittent    Aggravating Factors  standing and walking    Pain Relieving Factors ice, rest    Multiple Pain Sites Yes    Pain Score 7    Pain Location Back    Pain Orientation Right;Lower    Pain Descriptors / Indicators Aching;Constant    Pain Type Acute pain    Pain Onset 1 to 4 weeks ago    Pain Frequency Intermittent    Aggravating Factors  standing, walking    Pain Relieving Factors heat, rest                [facility] PT Assessment - 07/09/21 0001       Assessment   Medical Diagnosis s/p Lt knee arthroscopy with meniscal debridement, LBP    Referring Provider (PT) Fenton Foy, [facility]    Onset Date/Surgical Date 05/29/21  Hand Dominance Right    Next MD Visit 2 weeks      Precautions   Precautions Fall      Restrictions   Weight Bearing Restrictions No      Balance Screen   Has the patient fallen in the past 6 months No    Has the patient had a decrease in activity level because of a fear of falling?  No    Is the patient reluctant to leave their home because of a fear of falling?  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    AROM Assessment Site Knee    Right/Left Knee Left    Left Knee Extension 5    Left Knee Flexion --   Rt=Lt full     Strength   Overall Strength Deficits    Overall Strength Comments Lt knee 4/5, Lt hip 4-/5, Rt hip 4/5, Rt knee 5/5      Palpation   Spinal mobility unable to assess due to pain with rolling on bed    Palpation comment pain with palpation at T11-L2 transverse processes and associated paraspinals.  No tenderness over gluteals.  Well healing surgical inicisions over Lt knee with tenderness with scar mobs.  Good patellar mobility.  Tenderness over bil joint line at distal quads and hamstrings .      Transfers   Transfers Independent with all Transfers      Ambulation/Gait   Ambulation/Gait Yes    Ambulation/Gait Assistance 7: Independent    Assistive device Straight cane    Gait Pattern Step-through pattern;Decreased weight shift to left;Decreased stance time - left;Antalgic;Trunk flexed    Stairs Yes    Stairs Assistance 6: Modified independent (Device/Increase time)    Stair Management Technique Step to pattern                        Objective measurements completed on examination: See above findings.               PT Education - 07/09/21 1640     Education Details Access Code: [email or web address]    Person(s) Educated Patient    Methods Explanation;Demonstration;Handout    Comprehension Verbalized understanding;Returned demonstration              PT Short Term Goals - 07/09/21 1615       PT SHORT TERM GOAL #1   Title be independent in initial HEP    Time 4  Period Weeks    Status New    Target Date 08/06/21      PT SHORT TERM GOAL #2   Title reduce Lt knee and low back pain to stand and walk for 1 hour without increased pain or limitation     Time 4    Period Weeks    Status New    Target Date 08/06/21      PT SHORT TERM GOAL #3   Title demonstrate symmetry on level surfaces due to reduced LBP and Lt knee pain    Time 4    Period Weeks    Status New    Target Date 09/03/21      PT SHORT TERM GOAL #4   Title improve Lt knee strength to ascend steps with step-over-step pattern with use of 1 rail    Time 4    Period Weeks    Status New    Target Date 08/06/21               PT Long Term Goals - 07/09/21 1617       PT LONG TERM GOAL #1   Title be independent in an advanced HEP    Time 8    Period Weeks    Status New    Target Date 09/03/21      PT LONG TERM GOAL #2   Title report > or = to 70% reduction in Lt knee and LBP with standing and walking to improve community function    Time 8    Period Weeks    Status New    Target Date 09/03/21      PT LONG TERM GOAL #3   Title increased FOTO (knee) to > or = to 61    Time 8    Period Weeks    Status New    Target Date 09/03/21      PT LONG TERM GOAL #4   Title demonstrate > or = to 4+/5 to 5/5 Lt hip and knee strength to improve safety and and endurance in the community    Time 8    Period Weeks    Status New    Target Date 09/03/21      PT LONG TERM GOAL #5   Title improve Lt knee strength to negotiate steps with step-over-step gait with use of 1 rail    Time 8    Period Weeks    Status New    Target Date 09/03/21                    Plan - 07/09/21 1701     Clinical Impression Statement Pt presents to PT s/p Lt knee arthroscopy with meniscal debridement performed on 05/29/21.  Pt was walking with a cane s/p surgery and now reports Rt sided LBP that she would like to address as it relates to her gait asymmetries and imbalances since surgery.  Pt reports 5/10 Lt knee pain that is worse with standing and walking and she uses ice to reduce this pain.  LBP is 7-8/10 on the Rt and is worse with standing and walking.  Pain is reduced with  heat and rest.  Pt demonstrates antalgic gait pattern without device and some improved symmetry with use of standard cane on the Rt on level surface and ascends and descends steps with step-to gait. Quad set on the Lt is fair and improved with reps of quad sets.  Pt with limited and painful lumbar  a/ROM in all directions, hip flexibility was not able to be tested due to Rt sided LBP with testing.  Pt with tension and trigger points over Rt T10-L2 paraspinals and transverse processes.  Lt knee A/ROM 5-110 degrees.  Well healing surgical incisions over the Lt knee without edema and good patellar mobility.  Pt will  benefit from skilled PT to address Rt sided LBP (once order is signed my MD) and Lt knee strength, stability and endurance s/p knee scope surgery.    Personal Factors and Comorbidities Comorbidity 2    Comorbidities LBP, LT knee scope    Examination-Activity Limitations Stairs;Transfers;Locomotion Level;Bend;Bed Mobility    Examination-Participation Restrictions Cleaning;Community Activity;Shop;Occupation    Stability/Clinical Decision Making Evolving/Moderate complexity    Clinical Decision Making Moderate    Rehab Potential Good    PT Frequency 2x / week    PT Duration 8 weeks    PT Treatment/Interventions ADLs/Self Care Home Management;Cryotherapy;Moist Heat;Electrical Stimulation;Gait training;Stair training;Functional mobility training;Therapeutic activities;Therapeutic exercise;Balance training;Neuromuscular re-education;Patient/family education;Manual techniques;Taping;Dry needling;Passive range of motion;Spinal Manipulations;Joint Manipulations    PT Next Visit Plan see if order is signed so we can treat LBP more specifically, DN if order signed for low back, Lt knee strength, flexiblity, work on steps and gait symmetry    PT Home Exercise Plan Access Code: [email or web address]    Consulted and Agree with Plan of Care Patient             Patient will benefit from skilled therapeutic  intervention in order to improve the following deficits and impairments:  Abnormal gait, Decreased activity tolerance, Impaired flexibility, Pain, Improper body mechanics, Decreased range of motion, Increased muscle spasms, Difficulty walking, Decreased endurance  Visit Diagnosis: Acute pain of left knee - Plan: PT plan of care cert/re-cert  Other abnormalities of gait and mobility - Plan: PT plan of care cert/re-cert  Acute right-sided low back pain without sciatica - Plan: PT plan of care cert/re-cert  Muscle weakness (generalized) - Plan: PT plan of care cert/re-cert  Cramp and spasm - Plan: PT plan of care cert/re-cert     Problem List Patient Active Problem List   Diagnosis Date Noted   Lumbar radiculopathy 03/28/2020   Osteopenia 08/19/2018   Hyperglycemia 07/09/2018   Hypothyroidism 06/30/2017   Gallbladder polyp 05/20/2016   Fatty liver 05/20/2016   Obesity 05/01/2016   Vitamin D deficiency 07/14/2013   LOW BACK PAIN SYNDROME 01/23/2010   Status post radioactive iodine thyroid ablation 08/17/2009   Hyperlipidemia 07/26/2008   Essential hypertension 07/26/2008   Pain in joint of left shoulder 07/26/2008   Goiter 07/14/2007     Sigurd Sos, PT 07/09/21 5:07 PM  [facility] [address] [place], [name], [address] Phone: [phone]   Fax:  [phone]  Name: Kynzli Chow MRN: [id number] Date of Birth: [birth year]

## 2021-07-13 ENCOUNTER — Encounter: Payer: Self-pay | Admitting: Physical Therapy

## 2021-07-13 ENCOUNTER — Other Ambulatory Visit: Payer: Self-pay

## 2021-07-13 ENCOUNTER — Ambulatory Visit: Payer: 59 | Admitting: Physical Therapy

## 2021-07-13 DIAGNOSIS — M545 Low back pain, unspecified: Secondary | ICD-10-CM

## 2021-07-13 DIAGNOSIS — R2689 Other abnormalities of gait and mobility: Secondary | ICD-10-CM

## 2021-07-13 DIAGNOSIS — R252 Cramp and spasm: Secondary | ICD-10-CM

## 2021-07-13 DIAGNOSIS — M6281 Muscle weakness (generalized): Secondary | ICD-10-CM

## 2021-07-13 DIAGNOSIS — M25562 Pain in left knee: Secondary | ICD-10-CM

## 2021-07-13 NOTE — Therapy (Signed)
[facility] [address], [place], [name], [address] Phone: [phone]   Fax:  [phone]  Physical Therapy Treatment  Patient Details  Name: Joan [name] MRN: [id number] Date of Birth: 27-Mar-1956 Referring Provider (PT): Fenton Foy, [place]   Encounter Date: 07/13/2021   PT End of Session - 07/13/21 0759     Visit Number 2    Date for PT Re-Evaluation 09/03/21    Authorization Type UMR- medical review after 25    Authorization - Visit Number 2    Authorization - Number of Visits 25    PT Start Time 0758    PT Stop Time 0850    PT Time Calculation (min) 52 min    Behavior During Therapy [facility] for tasks assessed/performed             Past Medical History:  Diagnosis Date   Allergy    Anemia    due to Dysmenorrhea   Arthritis    generalized    Hypertension    initially after BCP initiated    Thyroid disease 2005 & 2009   nodule    Past Surgical History:  Procedure Laterality Date   ABDOMINAL HYSTERECTOMY  1998   for heavy menses and fibroids   BIOPSY THYROID  2005,2009   both negative   CESAREAN SECTION     x2   COLONOSCOPY  2009    Dr [facility]   EXPLORATORY LAPAROTOMY  1985   Ovarian cyst   [place]    LLQ pain due to ovarian cyst   SIGMOIDOSCOPY     TONSILLECTOMY      There were no vitals filed for this visit.   Subjective Assessment - 07/13/21 0802     Subjective My back is really bad this AM. Pretty high, maybe 10/10.Marland KitchenKNee is ok    Pertinent History LT knee scope 05/29/21    Currently in Pain? --   Back, RT lower 9-10/10   Multiple Pain Sites No                               [facility] Adult PT Treatment/Exercise - 07/13/21 0001       Self-Care   Self-Care Posture;Heat/Ice Application;ADL's    ADL's Sitting with lumbar support, not stooping or bending unsupported especially first thing in th emorning.   Sleeping positions and use  of pillows to support pelvis and back.   Posture Importance of support    Heat/Ice Application How and when to use      Lumbar Exercises: Stretches   Other Lumbar Stretch Exercise Seated side bend stretch 5x 2-3 sec hold, standing childs pose at counter top, supine SKC: stopped for increased pain.                    PT Education - 07/13/21 0820     Education Details --    Person(s) Educated --    Methods --    Comprehension --              PT Short Term Goals - 07/09/21 1615       PT SHORT TERM GOAL #1   Title be independent in initial HEP    Time 4    Period Weeks    Status New    Target Date 08/06/21      PT SHORT TERM GOAL #2   Title reduce  Lt knee and low back pain to stand and walk for 1 hour without increased pain or limitation    Time 4    Period Weeks    Status New    Target Date 08/06/21      PT SHORT TERM GOAL #3   Title demonstrate symmetry on level surfaces due to reduced LBP and Lt knee pain    Time 4    Period Weeks    Status New    Target Date 09/03/21      PT SHORT TERM GOAL #4   Title improve Lt knee strength to ascend steps with step-over-step pattern with use of 1 rail    Time 4    Period Weeks    Status New    Target Date 08/06/21               PT Long Term Goals - 07/09/21 1617       PT LONG TERM GOAL #1   Title be independent in an advanced HEP    Time 8    Period Weeks    Status New    Target Date 09/03/21      PT LONG TERM GOAL #2   Title report > or = to 70% reduction in Lt knee and LBP with standing and walking to improve community function    Time 8    Period Weeks    Status New    Target Date 09/03/21      PT LONG TERM GOAL #3   Title increased FOTO (knee) to > or = to 61    Time 8    Period Weeks    Status New    Target Date 09/03/21      PT LONG TERM GOAL #4   Title demonstrate > or = to 4+/5 to 5/5 Lt hip and knee strength to improve safety and and endurance in the community    Time 8     Period Weeks    Status New    Target Date 09/03/21      PT LONG TERM GOAL #5   Title improve Lt knee strength to negotiate steps with step-over-step gait with use of 1 rail    Time 8    Period Weeks    Status New    Target Date 09/03/21                   Plan - 07/13/21 0826     Clinical Impression Statement Pt arrives with high loevel Rt back pain. Pt had difficulty with bed mobility and could not tolerate one position too long. RT sidelying was most tolerable. Pain was so high pt is considering calling out of work today.Order signed in [facility] for back pain: pt could tolerate some stretching in sitting and standing but not in supine. Ice felt ok and IFC was applied for pain reduction. Knee was of no concern today. Instructes pt to use TENS unit at home while pain is high. Pt agreed to try.    Personal Factors and Comorbidities Comorbidity 2    Comorbidities LBP, LT knee scope    Examination-Activity Limitations Stairs;Transfers;Locomotion Level;Bend;Bed Mobility    Examination-Participation Restrictions Cleaning;Community Activity;Shop;Occupation    Stability/Clinical Decision Making Evolving/Moderate complexity    Rehab Potential Good    PT Frequency 2x / week    PT Duration 8 weeks    PT Treatment/Interventions ADLs/Self Care Home Management;Cryotherapy;Moist Heat;Electrical Stimulation;Gait training;Stair training;Functional mobility training;Therapeutic activities;Therapeutic exercise;Balance training;Neuromuscular re-education;Patient/family education;Manual  techniques;Taping;Dry needling;Passive range of motion;Spinal Manipulations;Joint Manipulations    PT Next Visit Plan Assess back pain, consider DN if seen by PT and it is indicated. Gentle ROM and stretching. Pt was considering taking some Gabapentin she had a home, follow up with this.    PT Home Exercise Plan Access Code: [email or web address]    Consulted and Agree with Plan of Care Patient             Patient will benefit  from skilled therapeutic intervention in order to improve the following deficits and impairments:  Abnormal gait, Decreased activity tolerance, Impaired flexibility, Pain, Improper body mechanics, Decreased range of motion, Increased muscle spasms, Difficulty walking, Decreased endurance  Visit Diagnosis: Acute pain of left knee  Other abnormalities of gait and mobility  Acute right-sided low back pain without sciatica  Muscle weakness (generalized)  Cramp and spasm     Problem List Patient Active Problem List   Diagnosis Date Noted   Lumbar radiculopathy 03/28/2020   Osteopenia 08/19/2018   Hyperglycemia 07/09/2018   Hypothyroidism 06/30/2017   Gallbladder polyp 05/20/2016   Fatty liver 05/20/2016   Obesity 05/01/2016   Vitamin D deficiency 07/14/2013   LOW BACK PAIN SYNDROME 01/23/2010   Status post radioactive iodine thyroid ablation 08/17/2009   Hyperlipidemia 07/26/2008   Essential hypertension 07/26/2008   Pain in joint of left shoulder 07/26/2008   Goiter 07/14/2007    Cerenity Goshorn, PTA 07/13/2021, 9:49 AM  [facility] [address] [place], [name], [address] Phone: [phone]   Fax:  [phone]  Name: Joan [name] MRN: [id number] Date of Birth: [birth year]

## 2021-07-16 ENCOUNTER — Encounter: Payer: Self-pay | Admitting: Physical Therapy

## 2021-07-16 ENCOUNTER — Other Ambulatory Visit: Payer: Self-pay

## 2021-07-16 ENCOUNTER — Ambulatory Visit: Payer: 59 | Admitting: Physical Therapy

## 2021-07-16 DIAGNOSIS — R2689 Other abnormalities of gait and mobility: Secondary | ICD-10-CM | POA: Diagnosis not present

## 2021-07-16 DIAGNOSIS — R252 Cramp and spasm: Secondary | ICD-10-CM | POA: Diagnosis not present

## 2021-07-16 DIAGNOSIS — M545 Low back pain, unspecified: Secondary | ICD-10-CM

## 2021-07-16 DIAGNOSIS — M25562 Pain in left knee: Secondary | ICD-10-CM | POA: Diagnosis not present

## 2021-07-16 DIAGNOSIS — M6281 Muscle weakness (generalized): Secondary | ICD-10-CM | POA: Diagnosis not present

## 2021-07-16 NOTE — Therapy (Signed)
[facility] [address], [place], [name], [address] Phone: [phone]   Fax:  [phone]  Physical Therapy Treatment  Patient Details  Name: Joan [name] MRN: [id number] Date of Birth: 1956-10-23 Referring Provider (PT): Fenton Foy, [place]   Encounter Date: 07/16/2021   PT End of Session - 07/16/21 0802     Visit Number 3    Date for PT Re-Evaluation 09/03/21    Authorization Type UMR- medical review after 25    Authorization - Visit Number 3    Authorization - Number of Visits 25    PT Start Time [id number]    PT Stop Time 0840    PT Time Calculation (min) 41 min    Activity Tolerance Patient tolerated treatment well    Behavior During Therapy [facility] for tasks assessed/performed             Past Medical History:  Diagnosis Date   Allergy    Anemia    due to Dysmenorrhea   Arthritis    generalized    Hypertension    initially after BCP initiated    Thyroid disease 2005 & 2009   nodule    Past Surgical History:  Procedure Laterality Date   ABDOMINAL HYSTERECTOMY  1998   for heavy menses and fibroids   BIOPSY THYROID  2005,2009   both negative   CESAREAN SECTION     x2   COLONOSCOPY  2009    Dr Annet[facility]   EXPLORATORY LAPAROTOMY  1985   Ovarian cyst   [place]    LLQ pain due to ovarian cyst   SIGMOIDOSCOPY     TONSILLECTOMY      There were no vitals filed for this visit.   Subjective Assessment - 07/16/21 0803     Subjective I have been using theTENS unit regularly, taking some medication and using the ice. This AM pt is wearing her TENS unit. Knee is touchy.    Pertinent History LT knee scope 05/29/21    Currently in Pain? Yes    Pain Score 6     Pain Location Back    Pain Orientation Lower    Pain Descriptors / Indicators Tender;Constant    Multiple Pain Sites No                               [facility] Adult PT Treatment/Exercise -  07/16/21 0001       Lumbar Exercises: Stretches   Lower Trunk Rotation --   AROM first 10x, then holds to strtech teh RT side 3x 20 sec   Other Lumbar Stretch Exercise Standing side bends 5x LT at countertop    Other Lumbar Stretch Exercise Standing childs pose strtech at countertop 3x 3 breaths: VC to pull lower abs up & in.      Lumbar Exercises: Aerobic   Recumbent Bike L1 x 5 min with PTA present to discuss current status      Lumbar Exercises: Sidelying   Other Sidelying Lumbar Exercises SL RT: Pilates breath with TA contraction on the exhale hold 3 sec 6x      Knee/Hip Exercises: Standing   Forward Step Up Left;2 sets;10 reps;Hand Hold: 2;Step Height: 4"    Forward Step Up Limitations At sink    SLS Bil 10 sec 3x, light touch of UE on TM VC to contract glutes and quads more  Knee/Hip Exercises: Supine   Short Arc Quad Sets Strengthening;Left;2 sets;10 reps   2# added, VC to add TA contraction concurrent with lift     Knee/Hip Exercises: Sidelying   Hip ABduction AROM;Strengthening;Left;2 sets;10 reps    Hip ABduction Limitations VC to engage TA initial, then lift hip                      PT Short Term Goals - 07/16/21 0817       PT SHORT TERM GOAL #1   Title be independent in initial HEP    Time 4    Period Weeks    Status Achieved    Target Date 08/06/21               PT Long Term Goals - 07/09/21 1617       PT LONG TERM GOAL #1   Title be independent in an advanced HEP    Time 8    Period Weeks    Status New    Target Date 09/03/21      PT LONG TERM GOAL #2   Title report > or = to 70% reduction in Lt knee and LBP with standing and walking to improve community function    Time 8    Period Weeks    Status New    Target Date 09/03/21      PT LONG TERM GOAL #3   Title increased FOTO (knee) to > or = to 61    Time 8    Period Weeks    Status New    Target Date 09/03/21      PT LONG TERM GOAL #4   Title demonstrate > or = to  4+/5 to 5/5 Lt hip and knee strength to improve safety and and endurance in the community    Time 8    Period Weeks    Status New    Target Date 09/03/21      PT LONG TERM GOAL #5   Title improve Lt knee strength to negotiate steps with step-over-step gait with use of 1 rail    Time 8    Period Weeks    Status New    Target Date 09/03/21                   Plan - 07/16/21 0805     Clinical Impression Statement Pt is able to manage her pain better with the use of her TENS unit, Gabapentin, and regular ice. Pt able to tolerate gentle stretching much better today. LT knee had no issues with level 1 type exercises, some back irrittaion with RT single limb stance. Pt wore TENS unit during entire session.    Personal Factors and Comorbidities Comorbidity 2    Comorbidities LBP, LT knee scope    Examination-Activity Limitations Stairs;Transfers;Locomotion Level;Bend;Bed Mobility    Examination-Participation Restrictions Cleaning;Community Activity;Shop;Occupation    Stability/Clinical Decision Making Evolving/Moderate complexity    Rehab Potential Good    PT Frequency 2x / week    PT Duration 8 weeks    PT Treatment/Interventions ADLs/Self Care Home Management;Cryotherapy;Moist Heat;Electrical Stimulation;Gait training;Stair training;Functional mobility training;Therapeutic activities;Therapeutic exercise;Balance training;Neuromuscular re-education;Patient/family education;Manual techniques;Taping;Dry needling;Passive range of motion;Spinal Manipulations;Joint Manipulations    PT Next Visit Plan Assess pt for dry neeldin gto her back? Continue to work on LT knee strength and stabiliaztion in conjunction with back exercises ( AROM, pain reduction, core stabilization.)    PT Home Exercise Plan Access Code: [facility]  Consulted and Agree with Plan of Care Patient             Patient will benefit from skilled therapeutic intervention in order to improve the following deficits and  impairments:  Abnormal gait, Decreased activity tolerance, Impaired flexibility, Pain, Improper body mechanics, Decreased range of motion, Increased muscle spasms, Difficulty walking, Decreased endurance  Visit Diagnosis: Acute pain of left knee  Other abnormalities of gait and mobility  Acute right-sided low back pain without sciatica  Muscle weakness (generalized)  Cramp and spasm     Problem List Patient Active Problem List   Diagnosis Date Noted   Lumbar radiculopathy 03/28/2020   Osteopenia 08/19/2018   Hyperglycemia 07/09/2018   Hypothyroidism 06/30/2017   Gallbladder polyp 05/20/2016   Fatty liver 05/20/2016   Obesity 05/01/2016   Vitamin D deficiency 07/14/2013   LOW BACK PAIN SYNDROME 01/23/2010   Status post radioactive iodine thyroid ablation 08/17/2009   Hyperlipidemia 07/26/2008   Essential hypertension 07/26/2008   Pain in joint of left shoulder 07/26/2008   Goiter 07/14/2007    Candelario Steppe, PTA 07/16/2021, 8:41 AM  [facility] [address] [place], [name], [address] Phone: [phone]   Fax:  [phone]  Name: Joan [name] MRN: [id number] Date of Birth: 1956-07-17

## 2021-07-19 ENCOUNTER — Ambulatory Visit: Payer: 59

## 2021-07-19 ENCOUNTER — Other Ambulatory Visit (HOSPITAL_COMMUNITY): Payer: Self-pay

## 2021-07-19 ENCOUNTER — Other Ambulatory Visit: Payer: Self-pay

## 2021-07-19 DIAGNOSIS — M545 Low back pain, unspecified: Secondary | ICD-10-CM | POA: Diagnosis not present

## 2021-07-19 DIAGNOSIS — M25562 Pain in left knee: Secondary | ICD-10-CM | POA: Diagnosis not present

## 2021-07-19 DIAGNOSIS — R2689 Other abnormalities of gait and mobility: Secondary | ICD-10-CM

## 2021-07-19 DIAGNOSIS — M6281 Muscle weakness (generalized): Secondary | ICD-10-CM

## 2021-07-19 DIAGNOSIS — R252 Cramp and spasm: Secondary | ICD-10-CM

## 2021-07-19 MED ORDER — CARESTART COVID-19 HOME TEST VI KIT
PACK | 0 refills | Status: DC
  Filled 2021-07-19: qty 4, 4d supply, fill #0

## 2021-07-19 NOTE — Therapy (Signed)
[facility] [address] [place], [name], [address] Phone: [phone]   Fax:  [phone]  Physical Therapy Treatment  Patient Details  Name: Joan [name] MRN: [id number] Date of Birth: [birth year] Referring Provider (PT): Fenton Foy, [place]   Encounter Date: 07/19/2021   PT End of Session - 07/19/21 0809     Visit Number 4    Date for PT Re-Evaluation 09/03/21    Authorization Type UMR- medical review after 25    Authorization - Visit Number 4    Authorization - Number of Visits 25    PT Start Time 0729    PT Stop Time 0802    PT Time Calculation (min) 33 min    Activity Tolerance Patient tolerated treatment well    Behavior During Therapy [facility] for tasks assessed/performed             Past Medical History:  Diagnosis Date   Allergy    Anemia    due to Dysmenorrhea   Arthritis    generalized    Hypertension    initially after BCP initiated    Thyroid disease 2005 & 2009   nodule    Past Surgical History:  Procedure Laterality Date   ABDOMINAL HYSTERECTOMY  1998   for heavy menses and fibroids   BIOPSY THYROID  2005,2009   both negative   CESAREAN SECTION     x2   COLONOSCOPY  2009    Dr Annette [facility]   EXPLORATORY LAPAROTOMY  1985   Ovarian cyst   [place]    LLQ pain due to ovarian cyst   SIGMOIDOSCOPY     TONSILLECTOMY      There were no vitals filed for this visit.   Subjective Assessment - 07/19/21 0734     Subjective I am still having a lot of back pain.    Pertinent History LT knee scope 05/29/21    Currently in Pain? Yes    Pain Score 7     Pain Location Back    Pain Orientation Lower    Pain Descriptors / Indicators Constant    Pain Type Surgical pain    Pain Onset More than a month ago    Pain Frequency Intermittent    Aggravating Factors  standing and walking    Pain Relieving Factors ice, rest                                [facility] Adult PT Treatment/Exercise - 07/19/21 0001       Manual Therapy   Manual Therapy Myofascial release;Soft tissue mobilization    Manual therapy comments elongation to Rt>Lt thoracolumbar musculature              Trigger Point Dry Needling - 07/19/21 0001     Consent Given? Yes    Education Handout Provided Yes    Muscles Treated Back/Hip Thoracic multifidi;Lumbar multifidi    Lumbar multifidi Response Palpable increased muscle length;Twitch response elicited    Thoracic multifidi response Twitch response elicited;Palpable increased muscle length                  PT Education - 07/19/21 0803     Education Details DN    Person(s) Educated Patient    Methods Explanation;Demonstration;Handout    Comprehension Verbalized understanding;Returned demonstration              PT  Short Term Goals - 07/16/21 0817       PT SHORT TERM GOAL #1   Title be independent in initial HEP    Time 4    Period Weeks    Status Achieved    Target Date 08/06/21               PT Long Term Goals - 07/09/21 1617       PT LONG TERM GOAL #1   Title be independent in an advanced HEP    Time 8    Period Weeks    Status New    Target Date 09/03/21      PT LONG TERM GOAL #2   Title report > or = to 70% reduction in Lt knee and LBP with standing and walking to improve community function    Time 8    Period Weeks    Status New    Target Date 09/03/21      PT LONG TERM GOAL #3   Title increased FOTO (knee) to > or = to 61    Time 8    Period Weeks    Status New    Target Date 09/03/21      PT LONG TERM GOAL #4   Title demonstrate > or = to 4+/5 to 5/5 Lt hip and knee strength to improve safety and and endurance in the community    Time 8    Period Weeks    Status New    Target Date 09/03/21      PT LONG TERM GOAL #5   Title improve Lt knee strength to negotiate steps with step-over-step gait with use of 1 rail    Time  8    Period Weeks    Status New    Target Date 09/03/21                   Plan - 07/19/21 0808     Clinical Impression Statement Pt with continued LBP on the Rt and rates it 7/10 today.  Pt is consistent in her HEP for knee strength and flexibility and TA activation.  Pt is working on Economist modifications to protect the lumbar spine with work and home tasks.  Session focused today on dry needling to the lower thoracic and lumbar spine with manual therapy to address tissue mobility.  Pt with good twitch response to DN and manual therapy and reported reduced pain today after session.  Pt with improved bed mobility at the end of session.  Pt encouraged to stretch consistently until next session to supplement DN.  Pt will continue to benefit from skilled PT to address knee s/p surgery and LBP.    PT Frequency 2x / week    PT Duration 8 weeks    PT Treatment/Interventions ADLs/Self Care Home Management;Cryotherapy;Moist Heat;Electrical Stimulation;Gait training;Stair training;Functional mobility training;Therapeutic activities;Therapeutic exercise;Balance training;Neuromuscular re-education;Patient/family education;Manual techniques;Taping;Dry needling;Passive range of motion;Spinal Manipulations;Joint Manipulations    PT Next Visit Plan add lumbopelvic strength, continue DN if helpful, knee stability exercises    PT Home Exercise Plan Access Code: [email or web address]    Recommended Other Services initial cert is signed    Consulted and Agree with Plan of Care Patient             Patient will benefit from skilled therapeutic intervention in order to improve the following deficits and impairments:  Abnormal gait, Decreased activity tolerance, Impaired flexibility, Pain, Improper body mechanics, Decreased range of motion, Increased muscle spasms,  Difficulty walking, Decreased endurance  Visit Diagnosis: Acute pain of left knee  Other abnormalities of gait and mobility  Acute right-sided  low back pain without sciatica  Muscle weakness (generalized)  Cramp and spasm     Problem List Patient Active Problem List   Diagnosis Date Noted   Lumbar radiculopathy 03/28/2020   Osteopenia 08/19/2018   Hyperglycemia 07/09/2018   Hypothyroidism 06/30/2017   Gallbladder polyp 05/20/2016   Fatty liver 05/20/2016   Obesity 05/01/2016   Vitamin D deficiency 07/14/2013   LOW BACK PAIN SYNDROME 01/23/2010   Status post radioactive iodine thyroid ablation 08/17/2009   Hyperlipidemia 07/26/2008   Essential hypertension 07/26/2008   Pain in joint of left shoulder 07/26/2008   Goiter 07/14/2007    Sigurd Sos, PT 07/19/21 8:14 AM   [facility] [address] [place], [name], [address] Phone: [phone]   Fax:  [phone]  Name: Germany Tilford MRN: [id number] Date of Birth: 1956/06/10

## 2021-07-19 NOTE — Patient Instructions (Signed)

## 2021-07-24 NOTE — Progress Notes (Signed)
Subjective:    Patient ID: Joan [name], female    DOB: Jun 10, 1956, 65 y.o.   MRN: [id number]   This visit occurred during the SARS-CoV-2 public health emergency.  Safety protocols were in place, including screening questions prior to the visit, additional usage of staff PPE, and extensive cleaning of exam room while observing appropriate contact time as indicated for disinfecting solutions.    HPI She is here for a physical exam.     Having increased back pain x 1 month- R side of spine T9-T12.  Has done PT and had dry needling. She has tried ice, heat and has used the tens unit.      Medications and allergies reviewed with patient and updated if appropriate.  Patient Active Problem List   Diagnosis Date Noted   Lumbar radiculopathy 03/28/2020   Osteopenia 08/19/2018   Hyperglycemia 07/09/2018   Hypothyroidism 06/30/2017   Gallbladder polyp 05/20/2016   Fatty liver 05/20/2016   Obesity 05/01/2016   Vitamin D deficiency 07/14/2013   LOW BACK PAIN SYNDROME 01/23/2010   Status post radioactive iodine thyroid ablation 08/17/2009   Hyperlipidemia 07/26/2008   Essential hypertension 07/26/2008   Pain in joint of left shoulder 07/26/2008   Goiter 07/14/2007    Current Outpatient Medications on File Prior to Visit  Medication Sig Dispense Refill   estradiol (VIVELLE-DOT) 0.025 MG/24HR Place 1 patch onto the skin 2 (two) times a week.       estradiol (VIVELLE-DOT) 0.05 MG/24HR patch APPLY 1 PATCH ONTO THE SKIN 2 TIMES WEEKLY 24 patch 4   Loratadine (CLARITIN PO) Take by mouth daily.     naproxen sodium (ALEVE) 220 MG tablet 1 tablet with food or milk as needed     SYNTHROID 100 MCG tablet TAKE 1 TABLET BY MOUTH DAILY 90 tablet 2   Vitamin D, Ergocalciferol, (DRISDOL) 1.25 MG (50000 UNIT) CAPS capsule Take 1 capsule by mouth once a week 12 capsule 4   No current facility-administered medications on file prior to visit.    Past Medical History:  Diagnosis Date    Allergy    Anemia    due to Dysmenorrhea   Arthritis    generalized    Hypertension    initially after BCP initiated    Thyroid disease 2005 & 2009   nodule    Past Surgical History:  Procedure Laterality Date   ABDOMINAL HYSTERECTOMY  1998   for heavy menses and fibroids   BIOPSY THYROID  2005,2009   both negative   CESAREAN SECTION     x2   COLONOSCOPY  2009    Dr [facility]   EXPLORATORY LAPAROTOMY  1985   Ovarian cyst   [place]    LLQ pain due to ovarian cyst   SIGMOIDOSCOPY     TONSILLECTOMY      Social History   Socioeconomic History   Marital status: Married    Spouse name: Not on file   Number of children: Not on file   Years of education: Not on file   Highest education level: Not on file  Occupational History   Not on file  Tobacco Use   Smoking status: Never   Smokeless tobacco: Never  Substance and Sexual Activity   Alcohol use: No   Drug use: No   Sexual activity: Not Currently  Other Topics Concern   Not on file  Social History Narrative   RN at [employer]  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      Social Determinants of Health   Financial Resource Strain: Not on file  Food Insecurity: Not on file  Transportation Needs: Not on file  Physical Activity: Not on file  Stress: Not on file  Social Connections: Not on file    Family History  Problem Relation Age of Onset   Hypertension Father    Kidney failure Father        Bright's Disease; died @ 74   Hypertension Mother    Hyperlipidemia Mother    Osteoarthritis Mother    Hypertension Brother    Diabetes Brother    Stroke Maternal Grandmother        in 62s   Hypertension Maternal Aunt        also DJD/ OA   Breast cancer Maternal Aunt    Heart attack Maternal Grandfather 69       also  CVA   Heart block Paternal Grandmother        pacemaker   Colon cancer Neg Hx    Colon polyps Neg Hx    Esophageal cancer Neg Hx    Rectal cancer Neg Hx    Stomach  cancer Neg Hx     Review of Systems  Constitutional:  Negative for chills and fever.  Eyes:  Negative for visual disturbance.  Respiratory:  Negative for cough, shortness of breath and wheezing.   Cardiovascular:  Negative for chest pain, palpitations and leg swelling.  Gastrointestinal:  Negative for abdominal pain, blood in stool, constipation, diarrhea and nausea.       No gerd  Genitourinary:  Negative for dysuria and hematuria.  Musculoskeletal:  Positive for arthralgias (left knee) and back pain.  Neurological:  Negative for light-headedness, numbness and headaches.  Psychiatric/Behavioral:  Negative for dysphoric mood. The patient is not nervous/anxious.       Objective:   Vitals:   07/25/21 1306  BP: 116/78  Pulse: 78  Temp: 98.2 F (36.8 C)  SpO2: 98%   Filed Weights   07/25/21 1306  Weight: 202 lb (91.6 kg)   Body mass index is 34.67 kg/m.  BP Readings from Last 3 Encounters:  07/25/21 116/78  07/19/20 126/82  03/28/20 136/80    Wt Readings from Last 3 Encounters:  07/25/21 202 lb (91.6 kg)  07/19/20 206 lb (93.4 kg)  03/28/20 205 lb 12.8 oz (93.4 kg)    Depression screen [facility] 2/9 07/25/2021 03/28/2020 07/14/2013  Decreased Interest 0 0 0  Down, Depressed, Hopeless 0 0 0  PHQ - 2 Score 0 0 0  Altered sleeping 0 - -  Tired, decreased energy 0 - -  Change in appetite 0 - -  Feeling bad or failure about yourself  0 - -  Trouble concentrating 0 - -  Moving slowly or fidgety/restless 0 - -  Suicidal thoughts 0 - -  PHQ-9 Score 0 - -    GAD 7 : Generalized Anxiety Score 07/25/2021  Nervous, Anxious, on Edge 0  Control/stop worrying 0  Worry too much - different things 0  Trouble relaxing 0  Restless 0  Easily annoyed or irritable 0  Afraid - awful might happen 0  Total GAD 7 Score 0       Physical Exam Constitutional: She appears well-developed and well-nourished. No distress.  HENT:  Head: Normocephalic and atraumatic.  Right Ear: External  ear normal. Normal ear canal and TM Left Ear: External ear normal.  Normal ear canal and TM Mouth/Throat:  Oropharynx is clear and moist.  Eyes: Conjunctivae and EOM are normal.  Neck: Neck supple. No tracheal deviation present. No thyromegaly present.  No carotid bruit  Cardiovascular: Normal rate, regular rhythm and normal heart sounds.   No murmur heard.  No edema. Pulmonary/Chest: Effort normal and breath sounds normal. No respiratory distress. She has no wheezes. She has no rales.  Breast: deferred   Abdominal: Soft. She exhibits no distension. There is no tenderness.  Lymphadenopathy: She has no cervical adenopathy.  Skin: Skin is warm and dry. She is not diaphoretic.  Psychiatric: She has a normal mood and affect. Her behavior is normal.     Lab Results  Component Value Date   WBC 7.5 07/19/2020   HGB 14.7 07/19/2020   HCT 43.0 07/19/2020   PLT 250 07/19/2020   GLUCOSE 93 07/19/2020   CHOL 251 (H) 07/19/2020   TRIG 162 (H) 07/19/2020   HDL 57 07/19/2020   LDLDIRECT 183.0 07/27/2008   LDLCALC 163 (H) 07/19/2020   ALT 26 07/19/2020   AST 17 07/19/2020   NA 138 07/19/2020   K 4.0 07/19/2020   CL 100 07/19/2020   CREATININE 0.67 07/19/2020   BUN 13 07/19/2020   CO2 28 07/19/2020   TSH 1.80 05/21/2017   HGBA1C 5.5 07/19/2020         Assessment & Plan:   Physical exam: Screening blood work  ordered [facility]  [facility] - [facility], [facility] aerobics Weight  recommended weight loss Substance abuse  none   Screened for depression using the PHQ 9 scale.  No evidence of depression.   Screened for anxiety using GAD7 Scale.  No evidence of anxiety.    Reviewed recommended immunizations.   Health Maintenance  Topic Date Due   Zoster Vaccines- Shingrix (1 of 2) Never done   MAMMOGRAM  06/08/2021   INFLUENZA VACCINE  06/25/2021   COVID-19 Vaccine (4 - Booster for Pfizer series) 08/07/2021   DEXA SCAN  09/16/2021   COLONOSCOPY (Pts 45-43yr Insurance coverage  will need to be confirmed)  05/09/2023   TETANUS/TDAP  06/29/2025   Hepatitis C Screening  Completed   PNA vac Low Risk Adult  Completed   HPV VACCINES  Aged Out          See Problem List for Assessment and Plan of chronic medical problems.

## 2021-07-24 NOTE — Patient Instructions (Addendum)
Blood work was ordered.  Have an xray of your back downstairs.    Medications changes include :   none  Your prescription(s) have been submitted to your pharmacy. Please take as directed and contact our office if you believe you are having problem(s) with the medication(s).  A referral was ordered for Dr Tamala Julian - sports medication.    Please followup in 1 year     Health Maintenance, Female Adopting a healthy lifestyle and getting preventive care are important in promoting health and wellness. Ask your health care provider about: The right schedule for you to have regular tests and exams. Things you can do on your own to prevent diseases and keep yourself healthy. What should I know about diet, weight, and exercise? Eat a healthy diet  Eat a diet that includes plenty of vegetables, fruits, low-fat dairy products, and lean protein. Do not eat a lot of foods that are high in solid fats, added sugars, or sodium. Maintain a healthy weight Body mass index (BMI) is used to identify weight problems. It estimates body fat based on height and weight. Your health care provider can help determine your BMI and help you achieve or maintain a healthy weight. Get regular exercise Get regular exercise. This is one of the most important things you can do for your health. Most adults should: Exercise for at least 150 minutes each week. The exercise should increase your heart rate and make you sweat (moderate-intensity exercise). Do strengthening exercises at least twice a week. This is in addition to the moderate-intensity exercise. Spend less time sitting. Even light physical activity can be beneficial. Watch cholesterol and blood lipids Have your blood tested for lipids and cholesterol at 67 years of age, then have this test every 5 years. Have your cholesterol levels checked more often if: Your lipid or cholesterol levels are high. You are older than 67 years of age. You are at high risk for  heart disease. What should I know about cancer screening? Depending on your health history and family history, you may need to have cancer screening at various ages. This may include screening for: Breast cancer. Cervical cancer. Colorectal cancer. Skin cancer. Lung cancer. What should I know about heart disease, diabetes, and high blood pressure? Blood pressure and heart disease High blood pressure causes heart disease and increases the risk of stroke. This is more likely to develop in people who have high blood pressure readings, are of African descent, or are overweight. Have your blood pressure checked: Every 3-5 years if you are 49-32 years of age. Every year if you are 23 years old or older. Diabetes Have regular diabetes screenings. This checks your fasting blood sugar level. Have the screening done: Once every three years after age 48 if you are at a normal weight and have a low risk for diabetes. More often and at a younger age if you are overweight or have a high risk for diabetes. What should I know about preventing infection? Hepatitis B If you have a higher risk for hepatitis B, you should be screened for this virus. Talk with your health care provider to find out if you are at risk for hepatitis B infection. Hepatitis C Testing is recommended for: Everyone born from [birth year] through 1967. Anyone with known risk factors for hepatitis C. Sexually transmitted infections (STIs) Get screened for STIs, including gonorrhea and chlamydia, if: You are sexually active and are younger than 67 years of age. You are older than 67 years  of age and your health care provider tells you that you are at risk for this type of infection. Your sexual activity has changed since you were last screened, and you are at increased risk for chlamydia or gonorrhea. Ask your health care provider if you are at risk. Ask your health care provider about whether you are at high risk for HIV. Your health care  provider may recommend a prescription medicine to help prevent HIV infection. If you choose to take medicine to prevent HIV, you should first get tested for HIV. You should then be tested every 3 months for as long as you are taking the medicine. Pregnancy If you are about to stop having your period (premenopausal) and you may become pregnant, seek counseling before you get pregnant. Take 400 to 800 micrograms (mcg) of folic acid every day if you become pregnant. Ask for birth control (contraception) if you want to prevent pregnancy. Osteoporosis and menopause Osteoporosis is a disease in which the bones lose minerals and strength with aging. This can result in bone fractures. If you are 12 years old or older, or if you are at risk for osteoporosis and fractures, ask your health care provider if you should: Be screened for bone loss. Take a calcium or vitamin D supplement to lower your risk of fractures. Be given hormone replacement therapy (HRT) to treat symptoms of menopause. Follow these instructions at home: Lifestyle Do not use any products that contain nicotine or tobacco, such as cigarettes, e-cigarettes, and chewing tobacco. If you need help quitting, ask your health care provider. Do not use street drugs. Do not share needles. Ask your health care provider for help if you need support or information about quitting drugs. Alcohol use Do not drink alcohol if: Your health care provider tells you not to drink. You are pregnant, may be pregnant, or are planning to become pregnant. If you drink alcohol: Limit how much you use to 0-1 drink a day. Limit intake if you are breastfeeding. Be aware of how much alcohol is in your drink. In the [place] one drink equals one 12 oz bottle of beer (355 mL), one 5 oz glass of wine (148 mL), or one 1 oz glass of hard liquor (44 mL). General instructions Schedule regular health, dental, and eye exams. Stay current with your vaccines. Tell your health  care provider if: You often feel depressed. You have ever been abused or do not feel safe at home. Summary Adopting a healthy lifestyle and getting preventive care are important in promoting health and wellness. Follow your health care provider's instructions about healthy diet, exercising, and getting tested or screened for diseases. Follow your health care provider's instructions on monitoring your cholesterol and blood pressure. This information is not intended to replace advice given to you by your health care provider. Make sure you discuss any questions you have with your health care provider. Document Revised: 01/19/2021 Document Reviewed: 11/04/2018 Elsevier Patient Education  2022 [employer].

## 2021-07-25 ENCOUNTER — Ambulatory Visit: Payer: 59

## 2021-07-25 ENCOUNTER — Other Ambulatory Visit: Payer: Self-pay

## 2021-07-25 ENCOUNTER — Ambulatory Visit (INDEPENDENT_AMBULATORY_CARE_PROVIDER_SITE_OTHER): Payer: 59 | Admitting: Internal Medicine

## 2021-07-25 ENCOUNTER — Encounter: Payer: Self-pay | Admitting: Internal Medicine

## 2021-07-25 ENCOUNTER — Other Ambulatory Visit (HOSPITAL_COMMUNITY): Payer: Self-pay

## 2021-07-25 ENCOUNTER — Ambulatory Visit (INDEPENDENT_AMBULATORY_CARE_PROVIDER_SITE_OTHER): Payer: 59

## 2021-07-25 VITALS — BP 116/78 | HR 78 | Temp 98.2°F | Ht 64.0 in | Wt 202.0 lb

## 2021-07-25 DIAGNOSIS — E782 Mixed hyperlipidemia: Secondary | ICD-10-CM

## 2021-07-25 DIAGNOSIS — M25562 Pain in left knee: Secondary | ICD-10-CM

## 2021-07-25 DIAGNOSIS — Z0000 Encounter for general adult medical examination without abnormal findings: Secondary | ICD-10-CM

## 2021-07-25 DIAGNOSIS — Z6834 Body mass index (BMI) 34.0-34.9, adult: Secondary | ICD-10-CM

## 2021-07-25 DIAGNOSIS — R252 Cramp and spasm: Secondary | ICD-10-CM | POA: Diagnosis not present

## 2021-07-25 DIAGNOSIS — Z1389 Encounter for screening for other disorder: Secondary | ICD-10-CM | POA: Diagnosis not present

## 2021-07-25 DIAGNOSIS — M6281 Muscle weakness (generalized): Secondary | ICD-10-CM | POA: Diagnosis not present

## 2021-07-25 DIAGNOSIS — E6609 Other obesity due to excess calories: Secondary | ICD-10-CM

## 2021-07-25 DIAGNOSIS — R739 Hyperglycemia, unspecified: Secondary | ICD-10-CM

## 2021-07-25 DIAGNOSIS — M549 Dorsalgia, unspecified: Secondary | ICD-10-CM

## 2021-07-25 DIAGNOSIS — M85852 Other specified disorders of bone density and structure, left thigh: Secondary | ICD-10-CM | POA: Diagnosis not present

## 2021-07-25 DIAGNOSIS — M546 Pain in thoracic spine: Secondary | ICD-10-CM | POA: Diagnosis not present

## 2021-07-25 DIAGNOSIS — E559 Vitamin D deficiency, unspecified: Secondary | ICD-10-CM | POA: Diagnosis not present

## 2021-07-25 DIAGNOSIS — R2689 Other abnormalities of gait and mobility: Secondary | ICD-10-CM | POA: Diagnosis not present

## 2021-07-25 DIAGNOSIS — I10 Essential (primary) hypertension: Secondary | ICD-10-CM

## 2021-07-25 DIAGNOSIS — E890 Postprocedural hypothyroidism: Secondary | ICD-10-CM

## 2021-07-25 DIAGNOSIS — Z1331 Encounter for screening for depression: Secondary | ICD-10-CM

## 2021-07-25 DIAGNOSIS — M545 Low back pain, unspecified: Secondary | ICD-10-CM | POA: Diagnosis not present

## 2021-07-25 LAB — COMPREHENSIVE METABOLIC PANEL
ALT: 28 U/L (ref 0–35)
AST: 25 U/L (ref 0–37)
Albumin: 4.5 g/dL (ref 3.5–5.2)
Alkaline Phosphatase: 76 U/L (ref 39–117)
BUN: 14 mg/dL (ref 6–23)
CO2: 30 mEq/L (ref 19–32)
Calcium: 9.7 mg/dL (ref 8.4–10.5)
Chloride: 97 mEq/L (ref 96–112)
Creatinine, Ser: 0.73 mg/dL (ref 0.40–1.20)
GFR: 85.03 mL/min (ref >60.00)
Glucose, Bld: 86 mg/dL (ref 70–99)
Potassium: 3.4 mEq/L — ABNORMAL LOW (ref 3.5–5.1)
Sodium: 135 mEq/L (ref 135–145)
Total Bilirubin: 0.4 mg/dL (ref 0.2–1.2)
Total Protein: 7.4 g/dL (ref 6.0–8.3)

## 2021-07-25 LAB — CBC WITH DIFFERENTIAL/PLATELET
Basophils Absolute: 0.1 10*3/uL (ref 0.0–0.1)
Basophils Relative: 0.7 % (ref 0.0–3.0)
Eosinophils Absolute: 0.1 10*3/uL (ref 0.0–0.7)
Eosinophils Relative: 1.1 % (ref 0.0–5.0)
HCT: 42.3 % (ref 36.0–46.0)
Hemoglobin: 14.3 g/dL (ref 12.0–15.0)
Lymphocytes Relative: 20.8 % (ref 12.0–46.0)
Lymphs Abs: 2 10*3/uL (ref 0.7–4.0)
MCHC: 33.8 g/dL (ref 30.0–36.0)
MCV: 92.6 fl (ref 78.0–100.0)
Monocytes Absolute: 0.6 10*3/uL (ref 0.1–1.0)
Monocytes Relative: 5.8 % (ref 3.0–12.0)
Neutro Abs: 6.9 10*3/uL (ref 1.4–7.7)
Neutrophils Relative %: 71.6 % (ref 43.0–77.0)
Platelets: 259 10*3/uL (ref 150.0–400.0)
RBC: 4.57 Mil/uL (ref 3.87–5.11)
RDW: 14.3 % (ref 11.5–15.5)
WBC: 9.7 10*3/uL (ref 4.0–10.5)

## 2021-07-25 LAB — LIPID PANEL
Cholesterol: 252 mg/dL — ABNORMAL HIGH (ref 0–200)
HDL: 53.4 mg/dL (ref >39.00)
LDL Cholesterol: 164 mg/dL — ABNORMAL HIGH (ref 0–99)
NonHDL: 198.67
Total CHOL/HDL Ratio: 5
Triglycerides: 175 mg/dL — ABNORMAL HIGH (ref 0.0–149.0)
VLDL: 35 mg/dL (ref 0.0–40.0)

## 2021-07-25 LAB — TSH: TSH: 1.99 u[IU]/mL (ref 0.35–5.50)

## 2021-07-25 LAB — VITAMIN D 25 HYDROXY (VIT D DEFICIENCY, FRACTURES): VITD: 46.43 ng/mL (ref 30.00–100.00)

## 2021-07-25 LAB — HEMOGLOBIN A1C: Hgb A1c MFr Bld: 5.4 % (ref 4.6–6.5)

## 2021-07-25 MED ORDER — AMLODIPINE BESY-BENAZEPRIL HCL 5-20 MG PO CAPS
1.0000 | ORAL_CAPSULE | Freq: Every day | ORAL | 3 refills | Status: DC
  Filled 2021-07-25: qty 90, 90d supply, fill #0
  Filled 2021-10-09: qty 90, 90d supply, fill #1
  Filled 2022-01-22: qty 90, 90d supply, fill #2
  Filled 2022-04-23: qty 30, 30d supply, fill #3
  Filled 2022-05-16: qty 30, 30d supply, fill #4
  Filled 2022-06-10: qty 30, 30d supply, fill #5

## 2021-07-25 MED ORDER — TRIAMTERENE-HCTZ 37.5-25 MG PO CAPS
1.0000 | ORAL_CAPSULE | Freq: Every day | ORAL | 3 refills | Status: DC
  Filled 2021-07-25: qty 90, 90d supply, fill #0
  Filled 2021-10-09: qty 90, 90d supply, fill #1
  Filled 2022-01-22: qty 90, 90d supply, fill #2
  Filled 2022-04-23: qty 90, 90d supply, fill #3

## 2021-07-25 NOTE — Assessment & Plan Note (Signed)
Chronic She is exercising  Encouraged weight loss

## 2021-07-25 NOTE — Assessment & Plan Note (Signed)
Chronic dexa up to date Continue regular exercise Check vitamin d level

## 2021-07-25 NOTE — Therapy (Signed)
[facility] [address], [place], [name], [address] Phone: [phone]   Fax:  [phone]  Physical Therapy Treatment  Patient Details  Name: Joan [name] MRN: [id number] Date of Birth: 21-Jul-1956 Referring Provider (PT): Fenton Foy, [place]   Encounter Date: 07/25/2021   PT End of Session - 07/25/21 0928     Visit Number 5    Date for PT Re-Evaluation 09/03/21    Authorization Type [employer]- medical review after 25    Authorization - Visit Number 5    Authorization - Number of Visits 25    PT Start Time [id number]    PT Stop Time 0929    PT Time Calculation (min) 42 min    Activity Tolerance Patient tolerated treatment well    Behavior During Therapy [facility] for tasks assessed/performed             Past Medical History:  Diagnosis Date   Allergy    Anemia    due to Dysmenorrhea   Arthritis    generalized    Hypertension    initially after BCP initiated    Thyroid disease 2005 & 2009   nodule    Past Surgical History:  Procedure Laterality Date   ABDOMINAL HYSTERECTOMY  1998   for heavy menses and fibroids   BIOPSY THYROID  2005,2009   both negative   CESAREAN SECTION     x2   COLONOSCOPY  2009    Dr [facility]   EXPLORATORY LAPAROTOMY  1985   Ovarian cyst   Joan [name]    LLQ pain due to ovarian cyst   SIGMOIDOSCOPY     TONSILLECTOMY      There were no vitals filed for this visit.   Subjective Assessment - 07/25/21 0850     Subjective I felt good for 1-2 days after needling.  Now pain is 7/10.    Currently in Pain? Yes    Pain Score 7     Pain Location Thoracic    Pain Orientation Right    Pain Descriptors / Indicators Constant;Grimacing    Pain Type Chronic pain    Pain Onset More than a month ago    Pain Frequency Intermittent    Aggravating Factors  standing, walking, activity, bed mobility    Pain Relieving Factors ice, rest    Pain Score 3    Pain Location  Knee    Pain Orientation Left    Pain Descriptors / Indicators Tender    Pain Type Surgical pain    Pain Onset More than a month ago    Pain Frequency Intermittent    Aggravating Factors  standing, walking    Pain Relieving Factors ice, rest                               [facility] Adult PT Treatment/Exercise - 07/25/21 0001       Lumbar Exercises: Sidelying   Other Sidelying Lumbar Exercises open book x10      Knee/Hip Exercises: Stretches   Active Hamstring Stretch Both;3 reps;20 seconds      Knee/Hip Exercises: Seated   Long Arc Quad Both;1 set;15 reps      Manual Therapy   Manual Therapy Myofascial release;Soft tissue mobilization    Manual therapy comments elongation to Rt>Lt thoracolumbar musculature              Trigger Point Dry Needling -  07/25/21 0001     Consent Given? Yes    Education Handout Provided Previously provided    Muscles Treated Back/Hip Thoracic multifidi;Lumbar multifidi    Lumbar multifidi Response Palpable increased muscle length;Twitch response elicited    Thoracic multifidi response Twitch response elicited;Palpable increased muscle length                  PT Education - 07/25/21 0906     Education Details Access Code: [facility]    Person(s) Educated Patient    Methods Explanation;Demonstration;Handout    Comprehension Returned demonstration;Verbalized understanding              PT Short Term Goals - 07/16/21 0817       PT SHORT TERM GOAL #1   Title be independent in initial HEP    Time 4    Period Weeks    Status Achieved    Target Date 08/06/21               PT Long Term Goals - 07/09/21 1617       PT LONG TERM GOAL #1   Title be independent in an advanced HEP    Time 8    Period Weeks    Status New    Target Date 09/03/21      PT LONG TERM GOAL #2   Title report > or = to 70% reduction in Lt knee and LBP with standing and walking to improve community function    Time 8    Period  Weeks    Status New    Target Date 09/03/21      PT LONG TERM GOAL #3   Title increased FOTO (knee) to > or = to 61    Time 8    Period Weeks    Status New    Target Date 09/03/21      PT LONG TERM GOAL #4   Title demonstrate > or = to 4+/5 to 5/5 Lt hip and knee strength to improve safety and and endurance in the community    Time 8    Period Weeks    Status New    Target Date 09/03/21      PT LONG TERM GOAL #5   Title improve Lt knee strength to negotiate steps with step-over-step gait with use of 1 rail    Time 8    Period Weeks    Status New    Target Date 09/03/21                   Plan - 07/25/21 0911     Clinical Impression Statement Pt had relief after DN last session for 2 days.  Pain has now returned and pt with continued lower thoracic pain on the Rt and rates it 7/10 today. Pain is localized to Rt thoracic spine T9-11 along the proximal ribs.  Pt denies any pain with breathing.   Pt is consistent in her HEP for knee strength and flexibility and TA activation.  Pt is working on Economist modifications to protect the lumbar spine with work and home tasks.  Session focused today on dry needling to the lower thoracic and lumbar spine with manual therapy to address tissue mobility in addition to HEP modifications to improve lumbar flexibility and knee strength. Pt had not been performing long arc quads and will resume this now.  Pt with good twitch response to DN and manual therapy and reported reduced pain today after session.  Pt remains hypersensitive to palpation over the Rt lower thoracic region.   Pt with improved bed mobility at the end of session.  Pt with slow and challenging bed mobility overall.   Pt encouraged to stretch consistently until next session to supplement DN.  Pt will continue to benefit from skilled PT to address knee s/p surgery and LBP.    PT Frequency 2x / week    PT Duration 8 weeks    PT Treatment/Interventions ADLs/Self Care Home  Management;Cryotherapy;Moist Heat;Electrical Stimulation;Gait training;Stair training;Functional mobility training;Therapeutic activities;Therapeutic exercise;Balance training;Neuromuscular re-education;Patient/family education;Manual techniques;Taping;Dry needling;Passive range of motion;Spinal Manipulations;Joint Manipulations    PT Next Visit Plan add more core strength as tolerated, see what her GP says after appt today, knee strength-try NuStep    PT Home Exercise Plan Access Code: [email or web address]    Consulted and Agree with Plan of Care Patient             Patient will benefit from skilled therapeutic intervention in order to improve the following deficits and impairments:  Abnormal gait, Decreased activity tolerance, Impaired flexibility, Pain, Improper body mechanics, Decreased range of motion, Increased muscle spasms, Difficulty walking, Decreased endurance  Visit Diagnosis: Acute pain of left knee  Other abnormalities of gait and mobility  Acute right-sided low back pain without sciatica  Muscle weakness (generalized)  Cramp and spasm     Problem List Patient Active Problem List   Diagnosis Date Noted   Lumbar radiculopathy 03/28/2020   Osteopenia 08/19/2018   Hyperglycemia 07/09/2018   Hypothyroidism 06/30/2017   Gallbladder polyp 05/20/2016   Fatty liver 05/20/2016   Obesity 05/01/2016   Vitamin D deficiency 07/14/2013   LOW BACK PAIN SYNDROME 01/23/2010   Status post radioactive iodine thyroid ablation 08/17/2009   Hyperlipidemia 07/26/2008   Essential hypertension 07/26/2008   Pain in joint of left shoulder 07/26/2008   Goiter 07/14/2007    Sigurd Sos, PT 07/25/21 9:31 AM  [facility] [address] [place], [name], [address] Phone: [phone]   Fax:  [phone]  Name: Joan [name] MRN: [id number] Date of Birth: 18-Dec-1955

## 2021-07-25 NOTE — Assessment & Plan Note (Signed)
Chronic Taking vitamin D daily Check vitamin D level  

## 2021-07-25 NOTE — Assessment & Plan Note (Signed)
Chronic Check a1c Low sugar / carb diet Stressed regular exercise  

## 2021-07-25 NOTE — Assessment & Plan Note (Signed)
Chronic BP well controlled Continue lotrel 5-20 mg qd, dyazide 37.5-25 qd cmp

## 2021-07-25 NOTE — Addendum Note (Signed)
Addended by: Boris Lown B on: 07/25/2021 01:54 PM   Modules accepted: Orders

## 2021-07-25 NOTE — Assessment & Plan Note (Signed)
Chronic Check lipid panel  Diet controlled Regular exercise and healthy diet encouraged

## 2021-07-25 NOTE — Patient Instructions (Signed)
Access Code: [email or web address] URL: [email or web address] Date: 07/25/2021 Prepared by: Claiborne Billings   Sidelying Open Book Thoracic Rotation with Knee on Foam Roll - 1 x daily - 7 x weekly - 3 sets - 10 reps

## 2021-07-25 NOTE — Assessment & Plan Note (Signed)
Chronic  Clinically euthyroid Managed by Dr Chalmers Cater

## 2021-07-27 ENCOUNTER — Encounter: Payer: Self-pay | Admitting: Physical Therapy

## 2021-07-27 ENCOUNTER — Ambulatory Visit: Payer: 59 | Attending: Orthopedic Surgery | Admitting: Physical Therapy

## 2021-07-27 ENCOUNTER — Other Ambulatory Visit: Payer: Self-pay

## 2021-07-27 DIAGNOSIS — M6281 Muscle weakness (generalized): Secondary | ICD-10-CM | POA: Diagnosis not present

## 2021-07-27 DIAGNOSIS — R2689 Other abnormalities of gait and mobility: Secondary | ICD-10-CM | POA: Insufficient documentation

## 2021-07-27 DIAGNOSIS — M25562 Pain in left knee: Secondary | ICD-10-CM | POA: Insufficient documentation

## 2021-07-27 DIAGNOSIS — R252 Cramp and spasm: Secondary | ICD-10-CM | POA: Diagnosis not present

## 2021-07-27 DIAGNOSIS — M545 Low back pain, unspecified: Secondary | ICD-10-CM | POA: Insufficient documentation

## 2021-07-27 NOTE — Therapy (Signed)
[facility] [address] [place], [name], [address] Phone: [phone]   Fax:  [phone]  Physical Therapy Treatment  Patient Details  Name: Joan [name] MRN: [id number] Date of Birth: 1956-11-23 Referring Provider (PT): Fenton Foy, [place]   Encounter Date: 07/27/2021   PT End of Session - 07/27/21 0847     Visit Number 6    Date for PT Re-Evaluation 09/03/21    Authorization Type UMR- medical review after 25    Authorization - Visit Number 6    Authorization - Number of Visits 25    PT Start Time [id number]    PT Stop Time 0928    PT Time Calculation (min) 41 min    Activity Tolerance Patient tolerated treatment well    Behavior During Therapy [facility] for tasks assessed/performed             Past Medical History:  Diagnosis Date   Allergy    Anemia    due to Dysmenorrhea   Arthritis    generalized    Hypertension    initially after BCP initiated    Thyroid disease 2005 & 2009   nodule    Past Surgical History:  Procedure Laterality Date   ABDOMINAL HYSTERECTOMY  1998   for heavy menses and fibroids   BIOPSY THYROID  2005,2009   both negative   CESAREAN SECTION     x2   COLONOSCOPY  2009    Dr Annet[facility]   EXPLORATORY LAPAROTOMY  1985   Ovarian cyst   [place]    LLQ pain due to ovarian cyst   SIGMOIDOSCOPY     TONSILLECTOMY      There were no vitals filed for this visit.   Subjective Assessment - 07/27/21 0848     Subjective Pain is still the same this AM. Seeing Sports Med MD in a month.    Pertinent History LT knee scope 05/29/21    Currently in Pain? Yes    Pain Score 7     Pain Location Back    Pain Orientation Right    Pain Descriptors / Indicators Constant    Multiple Pain Sites No                               [facility] Adult PT Treatment/Exercise - 07/27/21 0001       Lumbar Exercises: Stretches   Other Lumbar Stretch Exercise  Standing childs pose then LT side bend at countertop    Other Lumbar Stretch Exercise LAX ball QL release against the wall      Lumbar Exercises: Standing   Other Standing Lumbar Exercises Post pelvic tilts against the wall: 2x3 sec hold VC to not contract her neck and shoulders, focus on core      Lumbar Exercises: Supine   Glut Set 5 reps   ball, glutes, TA co-contractions hold 3 sec 5x   Clam 10 reps      Lumbar Exercises: Sidelying   Other Sidelying Lumbar Exercises SL RT: Pilates breath with TA contraction on the exhale hold 3 sec 10x   added lift of the RT foot off rol 10x with core contraction     Lumbar Exercises: Prone   Other Prone Lumbar Exercises attempted prone laying with pillow under pelvis: this increased pain  PT Short Term Goals - 07/16/21 0817       PT SHORT TERM GOAL #1   Title be independent in initial HEP    Time 4    Period Weeks    Status Achieved    Target Date 08/06/21               PT Long Term Goals - 07/09/21 1617       PT LONG TERM GOAL #1   Title be independent in an advanced HEP    Time 8    Period Weeks    Status New    Target Date 09/03/21      PT LONG TERM GOAL #2   Title report > or = to 70% reduction in Lt knee and LBP with standing and walking to improve community function    Time 8    Period Weeks    Status New    Target Date 09/03/21      PT LONG TERM GOAL #3   Title increased FOTO (knee) to > or = to 61    Time 8    Period Weeks    Status New    Target Date 09/03/21      PT LONG TERM GOAL #4   Title demonstrate > or = to 4+/5 to 5/5 Lt hip and knee strength to improve safety and and endurance in the community    Time 8    Period Weeks    Status New    Target Date 09/03/21      PT LONG TERM GOAL #5   Title improve Lt knee strength to negotiate steps with step-over-step gait with use of 1 rail    Time 8    Period Weeks    Status New    Target Date 09/03/21                    Plan - 07/27/21 0854     Clinical Impression Statement Pt arrives with continued cmplaints of same pain : same intensity and location. Pt's most comfortable position to exercise is in RT sidelying where she was able to perform core stabilization exercises without exacerbating her pain. Pt could not tolerate prone laying. Some minor relief with soft tissue work and muscle release work at the wall with a LAX ball. pt will work on getting her pelvis more neutral when standing and using the wall to give her feedback if she is tilting anteriorly too much.    Personal Factors and Comorbidities Comorbidity 2    Comorbidities LBP, LT knee scope    Examination-Activity Limitations Stairs;Transfers;Locomotion Level;Bend;Bed Mobility    Examination-Participation Restrictions Cleaning;Community Activity;Shop;Occupation    Stability/Clinical Decision Making Evolving/Moderate complexity    Rehab Potential Good    PT Frequency 2x / week    PT Duration 8 weeks    PT Treatment/Interventions ADLs/Self Care Home Management;Cryotherapy;Moist Heat;Electrical Stimulation;Gait training;Stair training;Functional mobility training;Therapeutic activities;Therapeutic exercise;Balance training;Neuromuscular re-education;Patient/family education;Manual techniques;Taping;Dry needling;Passive range of motion;Spinal Manipulations;Joint Manipulations    PT Next Visit Plan DN when seen by PT, continue with low level core exercises.    PT Home Exercise Plan Access Code: [email or web address]    Consulted and Agree with Plan of Care Patient             Patient will benefit from skilled therapeutic intervention in order to improve the following deficits and impairments:  Abnormal gait, Decreased activity tolerance, Impaired flexibility, Pain, Improper body mechanics, Decreased range of motion, Increased muscle  spasms, Difficulty walking, Decreased endurance  Visit Diagnosis: Acute pain of left knee  Other abnormalities of  gait and mobility  Acute right-sided low back pain without sciatica  Muscle weakness (generalized)  Cramp and spasm     Problem List Patient Active Problem List   Diagnosis Date Noted   Lumbar radiculopathy 03/28/2020   Osteopenia 08/19/2018   Hyperglycemia 07/09/2018   Hypothyroidism 06/30/2017   Gallbladder polyp 05/20/2016   Fatty liver 05/20/2016   Obesity 05/01/2016   Vitamin D deficiency 07/14/2013   LOW BACK PAIN SYNDROME 01/23/2010   Status post radioactive iodine thyroid ablation 08/17/2009   Hyperlipidemia 07/26/2008   Essential hypertension 07/26/2008   Pain in joint of left shoulder 07/26/2008   Goiter 07/14/2007    Jamario Colina, PTA 07/27/2021, 9:29 AM  [facility] [address], [place], [name], [address] Phone: [phone]   Fax:  [phone]  Name: Joan [name] MRN: [id number] Date of Birth: [birth year]

## 2021-07-31 ENCOUNTER — Other Ambulatory Visit: Payer: Self-pay

## 2021-07-31 ENCOUNTER — Ambulatory Visit: Payer: 59

## 2021-07-31 DIAGNOSIS — M545 Low back pain, unspecified: Secondary | ICD-10-CM | POA: Diagnosis not present

## 2021-07-31 DIAGNOSIS — M25562 Pain in left knee: Secondary | ICD-10-CM

## 2021-07-31 DIAGNOSIS — R2689 Other abnormalities of gait and mobility: Secondary | ICD-10-CM | POA: Diagnosis not present

## 2021-07-31 DIAGNOSIS — M6281 Muscle weakness (generalized): Secondary | ICD-10-CM | POA: Diagnosis not present

## 2021-07-31 DIAGNOSIS — R252 Cramp and spasm: Secondary | ICD-10-CM

## 2021-07-31 NOTE — Therapy (Signed)
[facility] [address], [place], [name], [address] Phone: [phone]   Fax:  [phone]  Physical Therapy Treatment  Patient Details  Name: Joan [name] MRN: [id number] Date of Birth: 1956/06/20 Referring Provider (PT): Fenton Foy, [place]   Encounter Date: 07/31/2021   PT End of Session - 07/31/21 0844     Visit Number 7    Date for PT Re-Evaluation 09/03/21    Authorization Type UMR- medical review after 25    Authorization - Visit Number 7    Authorization - Number of Visits 25    PT Start Time 0803    PT Stop Time 0845    PT Time Calculation (min) 42 min    Activity Tolerance Patient tolerated treatment well    Behavior During Therapy [facility] for tasks assessed/performed             Past Medical History:  Diagnosis Date   Allergy    Anemia    due to Dysmenorrhea   Arthritis    generalized    Hypertension    initially after BCP initiated    Thyroid disease 2005 & 2009   nodule    Past Surgical History:  Procedure Laterality Date   ABDOMINAL HYSTERECTOMY  1998   for heavy menses and fibroids   BIOPSY THYROID  2005,2009   both negative   CESAREAN SECTION     x2   COLONOSCOPY  2009    Dr Annette [facility]   EXPLORATORY LAPAROTOMY  1985   Ovarian cyst   [place]    LLQ pain due to ovarian cyst   SIGMOIDOSCOPY     TONSILLECTOMY      There were no vitals filed for this visit.   Subjective Assessment - 07/31/21 0805     Subjective I'm seeing Dr Wynelle Link today.  My back feels a little bit better.  Will see sports medicine MD next month.    Pertinent History LT knee scope 05/29/21    Currently in Pain? Yes    Pain Score 6     Pain Location Back    Pain Orientation Right    Pain Descriptors / Indicators Constant    Pain Type Chronic pain    Pain Onset More than a month ago    Pain Frequency Intermittent    Aggravating Factors  standing, walking, activity    Pain  Score 5    Pain Location Knee    Pain Orientation Left    Pain Descriptors / Indicators Tender    Pain Type Surgical pain    Pain Onset More than a month ago    Pain Frequency Intermittent    Aggravating Factors  standing and walking                [facility] PT Assessment - 07/31/21 0001       Assessment   Medical Diagnosis s/p Lt knee arthroscopy with meniscal debridement, LBP    Referring Provider (PT) Fenton Foy, [facility]    Onset Date/Surgical Date 05/29/21      Prior Function   Level of Independence Independent    Vocation Full time employment      Cognition   Overall Cognitive Status Within Functional Limits for tasks assessed      Strength   Overall Strength Comments Lt knee 4+/5  [facility] Adult PT Treatment/Exercise - 07/31/21 0001       Lumbar Exercises: Stretches   Active Hamstring Stretch 3 reps;20 seconds;Left;Right      Lumbar Exercises: Seated   Other Seated Lumbar Exercises ball roll outs: 3 ways x 5 each      Knee/Hip Exercises: Aerobic   Nustep Level 2x 8 minutes- PT present to discuss progress      Knee/Hip Exercises: Standing   Heel Raises 2 sets;10 reps    Forward Step Up Both;1 set;5 reps;Hand Hold: 1;Step Height: 4"    Forward Step Up Limitations poor eccentric control and verbal cues for technique with ascending    Rocker Board 3 minutes      Knee/Hip Exercises: Seated   Long Arc Quad Both;1 set;15 reps    Sit to [employer] without UE support;20 reps                      PT Short Term Goals - 07/16/21 0817       PT SHORT TERM GOAL #1   Title be independent in initial HEP    Time 4    Period Weeks    Status Achieved    Target Date 08/06/21               PT Long Term Goals - 07/09/21 1617       PT LONG TERM GOAL #1   Title be independent in an advanced HEP    Time 8    Period Weeks    Status New    Target Date 09/03/21      PT LONG TERM GOAL #2   Title report > or = to  70% reduction in Lt knee and LBP with standing and walking to improve community function    Time 8    Period Weeks    Status New    Target Date 09/03/21      PT LONG TERM GOAL #3   Title increased FOTO (knee) to > or = to 61    Time 8    Period Weeks    Status New    Target Date 09/03/21      PT LONG TERM GOAL #4   Title demonstrate > or = to 4+/5 to 5/5 Lt hip and knee strength to improve safety and and endurance in the community    Time 8    Period Weeks    Status New    Target Date 09/03/21      PT LONG TERM GOAL #5   Title improve Lt knee strength to negotiate steps with step-over-step gait with use of 1 rail    Time 8    Period Weeks    Status New    Target Date 09/03/21                   Plan - 07/31/21 [id number]     Clinical Impression Statement Pt returned to the gym this week and was able to do cardio and water classes.  Pt is concerned regarding Lt knee weakness and pain.  Pt has not been able to focus on knee strength due to LBP over the past weeks since surgery.  Pt has been able to participate in more exercise today with good quality of movement.  Pt with improved Lt knee strength overall.  Poor Lt knee control with ascending and descending 4" step and required max verbal and demo cues for correct technique.  Pt required  tactile and verbal cues for alignment with exercise today.  Pt will continue to benefit from skilled PT to address Lt knee post-op and LBP as it relates to functional mobility.    PT Frequency 2x / week    PT Duration 8 weeks    PT Treatment/Interventions ADLs/Self Care Home Management;Cryotherapy;Moist Heat;Electrical Stimulation;Gait training;Stair training;Functional mobility training;Therapeutic activities;Therapeutic exercise;Balance training;Neuromuscular re-education;Patient/family education;Manual techniques;Taping;Dry needling;Passive range of motion;Spinal Manipulations;Joint Manipulations    PT Next Visit Plan Functional mobility  strengthening for LEs and core, work on steps again, NuStep, sit to stand    PT Home Exercise Plan Access Code: [email or web address]    Consulted and Agree with Plan of Care Patient             Patient will benefit from skilled therapeutic intervention in order to improve the following deficits and impairments:  Abnormal gait, Decreased activity tolerance, Impaired flexibility, Pain, Improper body mechanics, Decreased range of motion, Increased muscle spasms, Difficulty walking, Decreased endurance  Visit Diagnosis: Acute pain of left knee  Other abnormalities of gait and mobility  Acute right-sided low back pain without sciatica  Cramp and spasm  Muscle weakness (generalized)     Problem List Patient Active Problem List   Diagnosis Date Noted   Lumbar radiculopathy 03/28/2020   Osteopenia 08/19/2018   Hyperglycemia 07/09/2018   Hypothyroidism 06/30/2017   Gallbladder polyp 05/20/2016   Fatty liver 05/20/2016   Obesity 05/01/2016   Vitamin D deficiency 07/14/2013   LOW BACK PAIN SYNDROME 01/23/2010   Status post radioactive iodine thyroid ablation 08/17/2009   Hyperlipidemia 07/26/2008   Essential hypertension 07/26/2008   Pain in joint of left shoulder 07/26/2008   Goiter 07/14/2007   Sigurd Sos, PT 07/31/21 8:46 AM   [facility] [address] [place], [name], [address] Phone: [phone]   Fax:  [phone]  Name: Joan [name] MRN: [id number] Date of Birth: 08/21/1956

## 2021-08-02 ENCOUNTER — Ambulatory Visit: Payer: 59

## 2021-08-03 ENCOUNTER — Ambulatory Visit: Payer: 59 | Admitting: Physical Therapy

## 2021-08-03 ENCOUNTER — Other Ambulatory Visit: Payer: Self-pay

## 2021-08-03 ENCOUNTER — Encounter: Payer: Self-pay | Admitting: Physical Therapy

## 2021-08-03 DIAGNOSIS — R252 Cramp and spasm: Secondary | ICD-10-CM | POA: Diagnosis not present

## 2021-08-03 DIAGNOSIS — M545 Low back pain, unspecified: Secondary | ICD-10-CM | POA: Diagnosis not present

## 2021-08-03 DIAGNOSIS — M6281 Muscle weakness (generalized): Secondary | ICD-10-CM | POA: Diagnosis not present

## 2021-08-03 DIAGNOSIS — R2689 Other abnormalities of gait and mobility: Secondary | ICD-10-CM | POA: Diagnosis not present

## 2021-08-03 DIAGNOSIS — M25562 Pain in left knee: Secondary | ICD-10-CM | POA: Diagnosis not present

## 2021-08-03 NOTE — Therapy (Signed)
[facility] [address] [place], [name], [address] Phone: [phone]   Fax:  [phone]  Physical Therapy Treatment  Patient Details  Name: Joan [name] MRN: [id number] Date of Birth: 12-02-1955 Referring Provider (PT): Fenton Foy, [place]   Encounter Date: 08/03/2021   PT End of Session - 08/03/21 0809     Visit Number 8    Date for PT Re-Evaluation 09/03/21    Authorization Type UMR- medical review after 25    Authorization - Visit Number 8    Authorization - Number of Visits 25    PT Start Time 0805    PT Stop Time [id number]    PT Time Calculation (min) 38 min    Activity Tolerance Patient tolerated treatment well    Behavior During Therapy [facility] for tasks assessed/performed             Past Medical History:  Diagnosis Date   Allergy    Anemia    due to Dysmenorrhea   Arthritis    generalized    Hypertension    initially after BCP initiated    Thyroid disease 2005 & 2009   nodule    Past Surgical History:  Procedure Laterality Date   ABDOMINAL HYSTERECTOMY  1998   for heavy menses and fibroids   BIOPSY THYROID  2005,2009   both negative   CESAREAN SECTION     x2   COLONOSCOPY  2009    Dr Annette [facility]   EXPLORATORY LAPAROTOMY  1985   Ovarian cyst   [place]    LLQ pain due to ovarian cyst   SIGMOIDOSCOPY     TONSILLECTOMY      There were no vitals filed for this visit.   Subjective Assessment - 08/03/21 0810     Subjective My back pain is intermittent now vs constant. Denies current back pain. Lt knee catches, MD said it will take time.    Pertinent History LT knee scope 05/29/21    Currently in Pain? No/denies    Multiple Pain Sites No                               [facility] Adult PT Treatment/Exercise - 08/03/21 0001       Lumbar Exercises: Stretches   Other Lumbar Stretch Exercise Standing childs pose then LT side bend at countertop     Other Lumbar Stretch Exercise LAX ball QL release against the wall      Lumbar Exercises: Standing   Other Standing Lumbar Exercises 15# reverse cable walks 10x VC for core and control      Knee/Hip Exercises: Aerobic   Stationary Bike L2 6 min with VC to maintain > 35 RPMS      Knee/Hip Exercises: Machines for Strengthening   Cybex Leg Press Seat 5 LTLE 30# 3x10   Small ROM, painful at full extension     Knee/Hip Exercises: Standing   Heel Raises 2 sets;10 reps    Forward Step Up Left;2 sets;10 reps;Hand Hold: 2;Step Height: 6"    Rocker Board 3 minutes   Static hold 30 sec at end   SLS Verbal instruction to do this exercise in the water. Pt demo and agreed verbally to try tonight      Knee/Hip Exercises: Seated   Long Arc Quad Strengthening;Left;2 sets;10 reps;Weights    Long Arc Quad Weight 2 lbs.   and ball  squeeze                      PT Short Term Goals - 07/16/21 0817       PT SHORT TERM GOAL #1   Title be independent in initial HEP    Time 4    Period Weeks    Status Achieved    Target Date 08/06/21               PT Long Term Goals - 07/09/21 1617       PT LONG TERM GOAL #1   Title be independent in an advanced HEP    Time 8    Period Weeks    Status New    Target Date 09/03/21      PT LONG TERM GOAL #2   Title report > or = to 70% reduction in Lt knee and LBP with standing and walking to improve community function    Time 8    Period Weeks    Status New    Target Date 09/03/21      PT LONG TERM GOAL #3   Title increased FOTO (knee) to > or = to 61    Time 8    Period Weeks    Status New    Target Date 09/03/21      PT LONG TERM GOAL #4   Title demonstrate > or = to 4+/5 to 5/5 Lt hip and knee strength to improve safety and and endurance in the community    Time 8    Period Weeks    Status New    Target Date 09/03/21      PT LONG TERM GOAL #5   Title improve Lt knee strength to negotiate steps with step-over-step gait  with use of 1 rail    Time 8    Period Weeks    Status New    Target Date 09/03/21                   Plan - 08/03/21 0811     Clinical Impression Statement Pt arrives today with no pain. Pt reports pain is now intermittent versus constant. Pt saw MD with no significant comments except her knee "will take time." Pt continues to get in the water 2x a week, trying to avoid any jumping. PTA suggested adding singel limb stance to her aquatic program, pt could demo and agreed to try. Todays session focused on both knee strength, core and back ROM/flexibility. pt demonstartes poor eccentric control with a few exercises. No back pain throughout session.    Personal Factors and Comorbidities Comorbidity 2    Comorbidities LBP, LT knee scope    Examination-Activity Limitations Stairs;Transfers;Locomotion Level;Bend;Bed Mobility    Examination-Participation Restrictions Cleaning;Community Activity;Shop;Occupation    Stability/Clinical Decision Making Evolving/Moderate complexity    Rehab Potential Good    PT Frequency 2x / week    PT Duration 8 weeks    PT Treatment/Interventions ADLs/Self Care Home Management;Cryotherapy;Moist Heat;Electrical Stimulation;Gait training;Stair training;Functional mobility training;Therapeutic activities;Therapeutic exercise;Balance training;Neuromuscular re-education;Patient/family education;Manual techniques;Taping;Dry needling;Passive range of motion;Spinal Manipulations;Joint Manipulations    PT Next Visit Plan Functional mobility strengthening for LEs and core, work on steps again, NuStep, sit to stand    PT Home Exercise Plan Access Code: [facility]    Consulted and Agree with Plan of Care Patient             Patient will benefit from skilled therapeutic intervention in order to  improve the following deficits and impairments:  Abnormal gait, Decreased activity tolerance, Impaired flexibility, Pain, Improper body mechanics, Decreased range of motion,  Increased muscle spasms, Difficulty walking, Decreased endurance  Visit Diagnosis: Acute pain of left knee  Other abnormalities of gait and mobility  Acute right-sided low back pain without sciatica  Muscle weakness (generalized)  Cramp and spasm     Problem List Patient Active Problem List   Diagnosis Date Noted   Lumbar radiculopathy 03/28/2020   Osteopenia 08/19/2018   Hyperglycemia 07/09/2018   Hypothyroidism 06/30/2017   Gallbladder polyp 05/20/2016   Fatty liver 05/20/2016   Obesity 05/01/2016   Vitamin D deficiency 07/14/2013   LOW BACK PAIN SYNDROME 01/23/2010   Status post radioactive iodine thyroid ablation 08/17/2009   Hyperlipidemia 07/26/2008   Essential hypertension 07/26/2008   Pain in joint of left shoulder 07/26/2008   Goiter 07/14/2007    Tenna Lacko, PTA 08/03/2021, 8:43 AM  [facility] [address] [place], [name], [address] Phone: [phone]   Fax:  [phone]  Name: Jutta Mckelvy MRN: [id number] Date of Birth: 20-Aug-1956

## 2021-08-07 ENCOUNTER — Other Ambulatory Visit: Payer: Self-pay

## 2021-08-07 ENCOUNTER — Ambulatory Visit: Payer: 59

## 2021-08-07 DIAGNOSIS — R2689 Other abnormalities of gait and mobility: Secondary | ICD-10-CM

## 2021-08-07 DIAGNOSIS — M545 Low back pain, unspecified: Secondary | ICD-10-CM | POA: Diagnosis not present

## 2021-08-07 DIAGNOSIS — R252 Cramp and spasm: Secondary | ICD-10-CM

## 2021-08-07 DIAGNOSIS — M25562 Pain in left knee: Secondary | ICD-10-CM

## 2021-08-07 DIAGNOSIS — M6281 Muscle weakness (generalized): Secondary | ICD-10-CM

## 2021-08-07 NOTE — Therapy (Signed)
[facility] [address] [place], [name], [address] Phone: [phone]   Fax:  [phone]  Physical Therapy Treatment  Patient Details  Name: Joan [name] MRN: [id number] Date of Birth: Feb 15, 1956 Referring Provider (PT): Fenton Foy, [place]   Encounter Date: 08/07/2021   PT End of Session - 08/07/21 0854     Visit Number 9    Date for PT Re-Evaluation 09/03/21    Authorization Type UMR- medical review after 25    Authorization - Visit Number 9    Authorization - Number of Visits 25    PT Start Time 0800    PT Stop Time [id number]    PT Time Calculation (min) 43 min    Activity Tolerance Patient tolerated treatment well    Behavior During Therapy [facility] for tasks assessed/performed             Past Medical History:  Diagnosis Date   Allergy    Anemia    due to Dysmenorrhea   Arthritis    generalized    Hypertension    initially after BCP initiated    Thyroid disease 2005 & 2009   nodule    Past Surgical History:  Procedure Laterality Date   ABDOMINAL HYSTERECTOMY  1998   for heavy menses and fibroids   BIOPSY THYROID  2005,2009   both negative   CESAREAN SECTION     x2   COLONOSCOPY  2009    Dr [facility]   EXPLORATORY LAPAROTOMY  1985   Ovarian cyst   [place]    LLQ pain due to ovarian cyst   SIGMOIDOSCOPY     TONSILLECTOMY      There were no vitals filed for this visit.   Subjective Assessment - 08/07/21 0804     Subjective I haven't been sleeping well.  Everything is aching.    Currently in Pain? Yes    Pain Score 7     Pain Location Back    Pain Orientation Right    Pain Descriptors / Indicators Constant;Tightness    Pain Type Chronic pain    Pain Onset More than a month ago    Pain Frequency Intermittent    Aggravating Factors  standing, walking , activity    Pain Relieving Factors ice, rest    Pain Score 5    Pain Location Knee    Pain  Orientation Left    Pain Descriptors / Indicators Tender;Sore    Pain Type Surgical pain    Pain Onset More than a month ago    Pain Frequency Intermittent    Aggravating Factors  standing and walking    Pain Relieving Factors ice, rest                               [facility] Adult PT Treatment/Exercise - 08/07/21 0001       Lumbar Exercises: Stretches   Active Hamstring Stretch 3 reps;20 seconds;Left;Right    Other Lumbar Stretch Exercise Standing childs pose then LT side bend at countertop      Lumbar Exercises: Standing   Other Standing Lumbar Exercises 15# reverse cable walks 10x VC for core and control      Lumbar Exercises: Seated   Other Seated Lumbar Exercises ball roll outs: 3 ways x 5 each      Knee/Hip Exercises: Aerobic   Elliptical Level 2x 8 min- PT present to  discuss progress      Knee/Hip Exercises: Machines for Strengthening   Cybex Leg Press Seat 5 Lt LE 30# 3x10   Small ROM, painful at full extension     Knee/Hip Exercises: Standing   Heel Raises 2 sets;10 reps   on mini tramp   Forward Step Up Left;2 sets;10 reps;Hand Hold: 2;Step Height: 6"    Rocker Board 3 minutes   Static hold 30 sec at end   SLS 10 second hold x 3 on Lt    Rebounder weight shifting 3 ways x1 min each      Knee/Hip Exercises: Seated   Long Arc Quad --    Long Arc [employer] --                       PT Short Term Goals - 07/16/21 0817       PT SHORT TERM GOAL #1   Title be independent in initial HEP    Time 4    Period Weeks    Status Achieved    Target Date 08/06/21               PT Long Term Goals - 07/09/21 1617       PT LONG TERM GOAL #1   Title be independent in an advanced HEP    Time 8    Period Weeks    Status New    Target Date 09/03/21      PT LONG TERM GOAL #2   Title report > or = to 70% reduction in Lt knee and LBP with standing and walking to improve community function    Time 8    Period Weeks    Status New     Target Date 09/03/21      PT LONG TERM GOAL #3   Title increased FOTO (knee) to > or = to 61    Time 8    Period Weeks    Status New    Target Date 09/03/21      PT LONG TERM GOAL #4   Title demonstrate > or = to 4+/5 to 5/5 Lt hip and knee strength to improve safety and and endurance in the community    Time 8    Period Weeks    Status New    Target Date 09/03/21      PT LONG TERM GOAL #5   Title improve Lt knee strength to negotiate steps with step-over-step gait with use of 1 rail    Time 8    Period Weeks    Status New    Target Date 09/03/21                   Plan - 08/07/21 0847     Clinical Impression Statement Pt arrives with LBP and Lt knee pain and difficulty sleeping over the past few nights. Pt reports pain is now intermittent versus constant.  Pt was able to tolerate all exercise in the clinic without increased pain, in fact, reported reduced pain after doing exercise.  Today's session focused on both knee strength, core and back ROM/flexibility. Pt with improved control of Lt quads with exercise today.  Pt requires verbal cues for knee alignment and to avoid hyperextension.  Pt will continue to benefit from skilled PT to address LBP and Lt knee strength after surgery.    PT Frequency 2x / week    PT Duration 8 weeks    PT  Treatment/Interventions ADLs/Self Care Home Management;Cryotherapy;Moist Heat;Electrical Stimulation;Gait training;Stair training;Functional mobility training;Therapeutic activities;Therapeutic exercise;Balance training;Neuromuscular re-education;Patient/family education;Manual techniques;Taping;Dry needling;Passive range of motion;Spinal Manipulations;Joint Manipulations    PT Next Visit Plan Functional mobility strengthening for LEs and core, work on steps again, NuStep, sit to stand    PT Home Exercise Plan Access Code: [email or web address]    Consulted and Agree with Plan of Care Patient             Patient will benefit from skilled  therapeutic intervention in order to improve the following deficits and impairments:  Abnormal gait, Decreased activity tolerance, Impaired flexibility, Pain, Improper body mechanics, Decreased range of motion, Increased muscle spasms, Difficulty walking, Decreased endurance  Visit Diagnosis: Acute pain of left knee  Other abnormalities of gait and mobility  Acute right-sided low back pain without sciatica  Muscle weakness (generalized)  Cramp and spasm     Problem List Patient Active Problem List   Diagnosis Date Noted   Lumbar radiculopathy 03/28/2020   Osteopenia 08/19/2018   Hyperglycemia 07/09/2018   Hypothyroidism 06/30/2017   Gallbladder polyp 05/20/2016   Fatty liver 05/20/2016   Obesity 05/01/2016   Vitamin D deficiency 07/14/2013   LOW BACK PAIN SYNDROME 01/23/2010   Status post radioactive iodine thyroid ablation 08/17/2009   Hyperlipidemia 07/26/2008   Essential hypertension 07/26/2008   Pain in joint of left shoulder 07/26/2008   Goiter 07/14/2007    Sigurd Sos, PT 08/07/21 8:56 AM   [facility] [address], [place], [name], [address] Phone: [phone]   Fax:  [phone]  Name: Joan [name] MRN: [id number] Date of Birth: 11-21-1956

## 2021-08-08 ENCOUNTER — Ambulatory Visit: Payer: 59

## 2021-08-08 DIAGNOSIS — M25562 Pain in left knee: Secondary | ICD-10-CM | POA: Diagnosis not present

## 2021-08-08 DIAGNOSIS — R252 Cramp and spasm: Secondary | ICD-10-CM | POA: Diagnosis not present

## 2021-08-08 DIAGNOSIS — M6281 Muscle weakness (generalized): Secondary | ICD-10-CM | POA: Diagnosis not present

## 2021-08-08 DIAGNOSIS — R2689 Other abnormalities of gait and mobility: Secondary | ICD-10-CM

## 2021-08-08 DIAGNOSIS — M545 Low back pain, unspecified: Secondary | ICD-10-CM

## 2021-08-08 NOTE — Therapy (Signed)
[facility] [address] [place], [name], [address] Phone: [phone]   Fax:  [phone]  Physical Therapy Treatment  Patient Details  Name: Joan [name] MRN: [id number] Date of Birth: [birth year] Referring Provider (PT): Fenton Foy, [place]   Encounter Date: 08/08/2021   PT End of Session - 08/08/21 0924     Visit Number 10    Date for PT Re-Evaluation 09/03/21    Authorization Type UMR- medical review after 25    Authorization - Visit Number 10    Authorization - Number of Visits 25    PT Start Time [id number]    PT Stop Time 0925    PT Time Calculation (min) 38 min    Activity Tolerance Patient tolerated treatment well    Behavior During Therapy [facility] for tasks assessed/performed             Past Medical History:  Diagnosis Date   Allergy    Anemia    due to Dysmenorrhea   Arthritis    generalized    Hypertension    initially after BCP initiated    Thyroid disease 2005 & 2009   nodule    Past Surgical History:  Procedure Laterality Date   ABDOMINAL HYSTERECTOMY  1998   for heavy menses and fibroids   BIOPSY THYROID  2005,2009   both negative   CESAREAN SECTION     x2   COLONOSCOPY  2009    Dr [facility]net[facility]   EXPLORATORY LAPAROTOMY  1985   Ovarian cyst   [place]    LLQ pain due to ovarian cyst   SIGMOIDOSCOPY     TONSILLECTOMY      There were no vitals filed for this visit.   Subjective Assessment - 08/08/21 0859     Subjective I went to water aerobics and then the gym after that.    Currently in Pain? Yes    Pain Score 6     Pain Location Back                               [facility] Adult PT Treatment/Exercise - 08/08/21 0001       Lumbar Exercises: Stretches   Active Hamstring Stretch 3 reps;20 seconds;Left;Right    Other Lumbar Stretch Exercise Standing childs pose then LT side bend at countertop      Lumbar Exercises: Standing    Other Standing Lumbar Exercises 15# reverse cable walks 10x VC for core and control      Lumbar Exercises: Seated   Other Seated Lumbar Exercises ball roll outs: 3 ways x 5 each      Knee/Hip Exercises: Aerobic   Elliptical Level 2x 8 min- PT present to discuss progress      Knee/Hip Exercises: Machines for Strengthening   Cybex Leg Press Seat 5 Lt LE 30# 3x10   Small ROM, painful at full extension     Knee/Hip Exercises: Standing   Heel Raises 2 sets;10 reps   on mini tramp   Forward Step Up Left;2 sets;10 reps;Hand Hold: 2;Step Height: 4"    Rocker Board 3 minutes   Static hold 30 sec at end   SLS 10 second hold x 3 on Lt    Rebounder weight shifting 3 ways x1 min each      Knee/Hip Exercises: Seated   Long Arc Quad Strengthening;Left;2 sets;10 reps;Weights    Long Arc  Quad Weight 2 lbs.   and ball squeeze                      PT Short Term Goals - 07/16/21 0817       PT SHORT TERM GOAL #1   Title be independent in initial HEP    Time 4    Period Weeks    Status Achieved    Target Date 08/06/21               PT Long Term Goals - 07/09/21 1617       PT LONG TERM GOAL #1   Title be independent in an advanced HEP    Time 8    Period Weeks    Status New    Target Date 09/03/21      PT LONG TERM GOAL #2   Title report > or = to 70% reduction in Lt knee and LBP with standing and walking to improve community function    Time 8    Period Weeks    Status New    Target Date 09/03/21      PT LONG TERM GOAL #3   Title increased FOTO (knee) to > or = to 61    Time 8    Period Weeks    Status New    Target Date 09/03/21      PT LONG TERM GOAL #4   Title demonstrate > or = to 4+/5 to 5/5 Lt hip and knee strength to improve safety and and endurance in the community    Time 8    Period Weeks    Status New    Target Date 09/03/21      PT LONG TERM GOAL #5   Title improve Lt knee strength to negotiate steps with step-over-step gait with use of 1  rail    Time 8    Period Weeks    Status New    Target Date 09/03/21                   Plan - 08/08/21 0911     Clinical Impression Statement Pt was here yesterday and also did water aerobics and some cardio at the gym.  Pt tolerated this activity well. Pain in low back and knee remain intermittent and pt is able to be more active due to this.   Pt was able to tolerate all exercise in the clinic without increased pain, with reduced pain after session.  Today's session focused on both knee strength, core and back ROM/flexibility. Pt with reduced eccentric control of quads.   Pt requires verbal cues for knee alignment and to avoid hyperextension.  Pt will continue to benefit from skilled PT to address LBP and Lt knee strength after surgery.    PT Frequency 2x / week    PT Duration 8 weeks    PT Treatment/Interventions ADLs/Self Care Home Management;Cryotherapy;Moist Heat;Electrical Stimulation;Gait training;Stair training;Functional mobility training;Therapeutic activities;Therapeutic exercise;Balance training;Neuromuscular re-education;Patient/family education;Manual techniques;Taping;Dry needling;Passive range of motion;Spinal Manipulations;Joint Manipulations    PT Next Visit Plan Functional mobility strengthening for LEs and core, work on steps again, NuStep, sit to stand    PT Home Exercise Plan Access Code: [email or web address]    Consulted and Agree with Plan of Care Patient             Patient will benefit from skilled therapeutic intervention in order to improve the following deficits and impairments:  Abnormal gait, Decreased activity tolerance,  Impaired flexibility, Pain, Improper body mechanics, Decreased range of motion, Increased muscle spasms, Difficulty walking, Decreased endurance  Visit Diagnosis: Acute pain of left knee  Other abnormalities of gait and mobility  Acute right-sided low back pain without sciatica  Muscle weakness (generalized)  Cramp and  spasm     Problem List Patient Active Problem List   Diagnosis Date Noted   Lumbar radiculopathy 03/28/2020   Osteopenia 08/19/2018   Hyperglycemia 07/09/2018   Hypothyroidism 06/30/2017   Gallbladder polyp 05/20/2016   Fatty liver 05/20/2016   Obesity 05/01/2016   Vitamin D deficiency 07/14/2013   LOW BACK PAIN SYNDROME 01/23/2010   Status post radioactive iodine thyroid ablation 08/17/2009   Hyperlipidemia 07/26/2008   Essential hypertension 07/26/2008   Pain in joint of left shoulder 07/26/2008   Goiter 07/14/2007     Sigurd Sos, PT 08/08/21 9:29 AM   [facility] [address] [place], [name], [address] Phone: [phone]   Fax:  [phone]  Name: Joan [name] MRN: [id number] Date of Birth: 30-May-1956

## 2021-08-13 ENCOUNTER — Other Ambulatory Visit: Payer: Self-pay

## 2021-08-13 ENCOUNTER — Ambulatory Visit: Payer: 59 | Admitting: Physical Therapy

## 2021-08-13 ENCOUNTER — Encounter: Payer: Self-pay | Admitting: Physical Therapy

## 2021-08-13 DIAGNOSIS — R2689 Other abnormalities of gait and mobility: Secondary | ICD-10-CM

## 2021-08-13 DIAGNOSIS — M25562 Pain in left knee: Secondary | ICD-10-CM

## 2021-08-13 DIAGNOSIS — M545 Low back pain, unspecified: Secondary | ICD-10-CM

## 2021-08-13 DIAGNOSIS — M6281 Muscle weakness (generalized): Secondary | ICD-10-CM

## 2021-08-13 DIAGNOSIS — R252 Cramp and spasm: Secondary | ICD-10-CM | POA: Diagnosis not present

## 2021-08-13 NOTE — Therapy (Signed)
[facility] [address] [place], [name], [address] Phone: [phone]   Fax:  [phone]  Physical Therapy Treatment  Patient Details  Name: Joan [name] MRN: [id number] Date of Birth: 1956/06/11 Referring Provider (PT): Fenton Foy, [place]   Encounter Date: 08/13/2021   PT End of Session - 08/13/21 0853     Visit Number 11    Date for PT Re-Evaluation 09/03/21    Authorization Type UMR- medical review after 25    Authorization - Visit Number 11    Authorization - Number of Visits 25    PT Start Time [phone]    PT Stop Time 0930    PT Time Calculation (min) 39 min    Activity Tolerance Patient tolerated treatment well    Behavior During Therapy [facility] for tasks assessed/performed             Past Medical History:  Diagnosis Date   Allergy    Anemia    due to Dysmenorrhea   Arthritis    generalized    Hypertension    initially after BCP initiated    Thyroid disease 2005 & 2009   nodule    Past Surgical History:  Procedure Laterality Date   ABDOMINAL HYSTERECTOMY  1998   for heavy menses and fibroids   BIOPSY THYROID  2005,2009   both negative   CESAREAN SECTION     x2   COLONOSCOPY  2009    Dr Annette [facility]   EXPLORATORY LAPAROTOMY  1985   Ovarian cyst   [place]    LLQ pain due to ovarian cyst   SIGMOIDOSCOPY     TONSILLECTOMY      There were no vitals filed for this visit.   Subjective Assessment - 08/13/21 0854     Subjective Just returned from a weekend retreat, had to sit a lot. A little stiff,  my back pain comes and goes now.    Pertinent History LT knee scope 05/29/21    Currently in Pain? Yes   Lt knee 6/10, Back 0/10                              [facility] Adult PT Treatment/Exercise - 08/13/21 0001       Knee/Hip Exercises: Aerobic   Stationary Bike L2 x 6 min to end session    Elliptical L1 x 4 min warm up and discussion of status       Knee/Hip Exercises: Machines for Strengthening   Cybex Leg Press Seat 5: LTLE with some assistance frm RTLE to manage pain. Too painful only using LTLE., 3x10      Knee/Hip Exercises: Standing   Heel Raises Both;3 sets;10 reps   VC to pay attention to quad contracting during the exercise.   Forward Step Up Left;2 sets;10 reps;Hand Hold: 2;Step Height: 4"    Rocker Board 3 minutes   Static hold 30 sec at end   Rebounder weight shifting 3 ways x1 min each      Knee/Hip Exercises: Seated   Long Arc Quad Strengthening;Left;1 set;10 reps    Long Arc Quad Weight 3 lbs.   with ball squeeze concurrent   Ball Squeeze 5 sec 10x                       PT Short Term Goals - 08/13/21 [id number]  PT SHORT TERM GOAL #3   Title demonstrate symmetry on level surfaces due to reduced LBP and Lt knee pain    Time 4    Period Weeks    Status Achieved    Target Date 09/03/21               PT Long Term Goals - 07/09/21 1617       PT LONG TERM GOAL #1   Title be independent in an advanced HEP    Time 8    Period Weeks    Status New    Target Date 09/03/21      PT LONG TERM GOAL #2   Title report > or = to 70% reduction in Lt knee and LBP with standing and walking to improve community function    Time 8    Period Weeks    Status New    Target Date 09/03/21      PT LONG TERM GOAL #3   Title increased FOTO (knee) to > or = to 61    Time 8    Period Weeks    Status New    Target Date 09/03/21      PT LONG TERM GOAL #4   Title demonstrate > or = to 4+/5 to 5/5 Lt hip and knee strength to improve safety and and endurance in the community    Time 8    Period Weeks    Status New    Target Date 09/03/21      PT LONG TERM GOAL #5   Title improve Lt knee strength to negotiate steps with step-over-step gait with use of 1 rail    Time 8    Period Weeks    Status New    Target Date 09/03/21                   Plan - 08/13/21 0914     Clinical Impression  Statement Pt arrives after being at a conference over the weekend where she had to sit alot and did not have her ice machine. She reports doing ok, just stiff and some usual knee pain. Not getting in the water for exercise this weekend affected her knee in negative way. When asked about her standing tolerance pt said she wasn't sure and would have to pay attention to it this week. Pt can ambulate symmetrically meeting STG. Pt does experience knee pain at end range consistently. Riding the stationary at the end of the session made the knee feel "better" after the strengthening exercises.    Personal Factors and Comorbidities Comorbidity 2    Comorbidities LBP, LT knee scope    Examination-Activity Limitations Stairs;Transfers;Locomotion Level;Bend;Bed Mobility    Examination-Participation Restrictions Cleaning;Community Activity;Shop;Occupation    Stability/Clinical Decision Making Evolving/Moderate complexity    Rehab Potential Good    PT Frequency 2x / week    PT Duration 8 weeks    PT Treatment/Interventions ADLs/Self Care Home Management;Cryotherapy;Moist Heat;Electrical Stimulation;Gait training;Stair training;Functional mobility training;Therapeutic activities;Therapeutic exercise;Balance training;Neuromuscular re-education;Patient/family education;Manual techniques;Taping;Dry needling;Passive range of motion;Spinal Manipulations;Joint Manipulations    PT Next Visit Plan Functional mobility strengthening for LEs and core, work on steps again, NuStep, sit to stand    PT Home Exercise Plan Access Code: [email or web address]    Consulted and Agree with Plan of Care Patient             Patient will benefit from skilled therapeutic intervention in order to improve the following deficits and impairments:  Abnormal  gait, Decreased activity tolerance, Impaired flexibility, Pain, Improper body mechanics, Decreased range of motion, Increased muscle spasms, Difficulty walking, Decreased endurance  Visit  Diagnosis: Acute pain of left knee  Other abnormalities of gait and mobility  Acute right-sided low back pain without sciatica  Muscle weakness (generalized)  Cramp and spasm     Problem List Patient Active Problem List   Diagnosis Date Noted   Lumbar radiculopathy 03/28/2020   Osteopenia 08/19/2018   Hyperglycemia 07/09/2018   Hypothyroidism 06/30/2017   Gallbladder polyp 05/20/2016   Fatty liver 05/20/2016   Obesity 05/01/2016   Vitamin D deficiency 07/14/2013   LOW BACK PAIN SYNDROME 01/23/2010   Status post radioactive iodine thyroid ablation 08/17/2009   Hyperlipidemia 07/26/2008   Essential hypertension 07/26/2008   Pain in joint of left shoulder 07/26/2008   Goiter 07/14/2007    Alphonzo Devera, PTA 08/13/2021, 9:27 AM  [facility] [address] [place], [name], [address] Phone: [phone]   Fax:  [phone]  Name: Joan [name] MRN: [id number] Date of Birth: 05-08-1956

## 2021-08-14 DIAGNOSIS — M150 Primary generalized (osteo)arthritis: Secondary | ICD-10-CM | POA: Diagnosis not present

## 2021-08-14 DIAGNOSIS — Z6834 Body mass index (BMI) 34.0-34.9, adult: Secondary | ICD-10-CM | POA: Diagnosis not present

## 2021-08-14 DIAGNOSIS — M130 Polyarthritis, unspecified: Secondary | ICD-10-CM | POA: Diagnosis not present

## 2021-08-14 DIAGNOSIS — E669 Obesity, unspecified: Secondary | ICD-10-CM | POA: Diagnosis not present

## 2021-08-14 DIAGNOSIS — M5136 Other intervertebral disc degeneration, lumbar region: Secondary | ICD-10-CM | POA: Diagnosis not present

## 2021-08-17 ENCOUNTER — Other Ambulatory Visit: Payer: Self-pay

## 2021-08-17 ENCOUNTER — Encounter: Payer: Self-pay | Admitting: Physical Therapy

## 2021-08-17 ENCOUNTER — Ambulatory Visit: Payer: 59 | Admitting: Physical Therapy

## 2021-08-17 DIAGNOSIS — R2689 Other abnormalities of gait and mobility: Secondary | ICD-10-CM

## 2021-08-17 DIAGNOSIS — R252 Cramp and spasm: Secondary | ICD-10-CM

## 2021-08-17 DIAGNOSIS — M6281 Muscle weakness (generalized): Secondary | ICD-10-CM

## 2021-08-17 DIAGNOSIS — M545 Low back pain, unspecified: Secondary | ICD-10-CM

## 2021-08-17 DIAGNOSIS — M25562 Pain in left knee: Secondary | ICD-10-CM | POA: Diagnosis not present

## 2021-08-17 NOTE — Therapy (Signed)
[facility] [address] [place], [name], [address] Phone: [phone]   Fax:  [phone]  Physical Therapy Treatment  Patient Details  Name: Joan [name] MRN: [id number] Date of Birth: 01-Mar-1956 Referring Provider (PT): Fenton Foy, [place]   Encounter Date: 08/17/2021   PT End of Session - 08/17/21 0813     Visit Number 12    Date for PT Re-Evaluation 09/03/21    Authorization Type UMR- medical review after 25    Authorization - Visit Number 12    Authorization - Number of Visits 25    PT Start Time 0804    PT Stop Time 3295    PT Time Calculation (min) 42 min    Activity Tolerance Patient tolerated treatment well    Behavior During Therapy [facility] for tasks assessed/performed             Past Medical History:  Diagnosis Date   Allergy    Anemia    due to Dysmenorrhea   Arthritis    generalized    Hypertension    initially after BCP initiated    Thyroid disease 2005 & 2009   nodule    Past Surgical History:  Procedure Laterality Date   ABDOMINAL HYSTERECTOMY  1998   for heavy menses and fibroids   BIOPSY THYROID  2005,2009   both negative   CESAREAN SECTION     x2   COLONOSCOPY  2009    Dr [facility]   EXPLORATORY LAPAROTOMY  1985   Ovarian cyst   [place]    LLQ pain due to ovarian cyst   SIGMOIDOSCOPY     TONSILLECTOMY      There were no vitals filed for this visit.   Subjective Assessment - 08/17/21 0815     Subjective Knee is sore. My back is more of a "nagging" pain.    Pertinent History LT knee scope 05/29/21    Currently in Pain? Yes    Pain Score 6     Pain Location Knee    Pain Orientation Left    Pain Descriptors / Indicators Sore    Aggravating Factors  Standing    Pain Relieving Factors ice                               [facility] Adult PT Treatment/Exercise - 08/17/21 0001       Lumbar Exercises: Stretches   Other  Lumbar Stretch Exercise Standing childs pose then LT side bend at countertop   TC at back ribs for breathing into back body   Other Lumbar Stretch Exercise Open book stretch 10x Bil   Vc to turn head     Lumbar Exercises: Seated   Other Seated Lumbar Exercises Seated on red ball for TA awareness 1 min, added red band SASH upper on LT/lower on Rt 6x, hip rolls 10x on ball      Knee/Hip Exercises: Aerobic   Nustep L2 x 8 min for knee warm up and discussion of current status      Knee/Hip Exercises: Seated   Sit to Sand 10 reps;without UE support   holding 5# KB                      PT Short Term Goals - 08/13/21 0917       PT SHORT TERM GOAL #3   Title demonstrate symmetry  on level surfaces due to reduced LBP and Lt knee pain    Time 4    Period Weeks    Status Achieved    Target Date 09/03/21               PT Long Term Goals - 07/09/21 1617       PT LONG TERM GOAL #1   Title be independent in an advanced HEP    Time 8    Period Weeks    Status New    Target Date 09/03/21      PT LONG TERM GOAL #2   Title report > or = to 70% reduction in Lt knee and LBP with standing and walking to improve community function    Time 8    Period Weeks    Status New    Target Date 09/03/21      PT LONG TERM GOAL #3   Title increased FOTO (knee) to > or = to 61    Time 8    Period Weeks    Status New    Target Date 09/03/21      PT LONG TERM GOAL #4   Title demonstrate > or = to 4+/5 to 5/5 Lt hip and knee strength to improve safety and and endurance in the community    Time 8    Period Weeks    Status New    Target Date 09/03/21      PT LONG TERM GOAL #5   Title improve Lt knee strength to negotiate steps with step-over-step gait with use of 1 rail    Time 8    Period Weeks    Status New    Target Date 09/03/21                   Plan - 08/17/21 0816     Clinical Impression Statement Pt arrives with Lt knee pain and complaints of intermittent  back pain. Pt has difficulty with her back especially when her LT knee "acts up." She can complete her exercies with no increased pain. Pt reports strteching to her back feels good. She sees a Sports Medicine MD next Monday.    Personal Factors and Comorbidities Comorbidity 2    Comorbidities LBP, LT knee scope    Examination-Activity Limitations Stairs;Transfers;Locomotion Level;Bend;Bed Mobility    Examination-Participation Restrictions Cleaning;Community Activity;Shop;Occupation    Stability/Clinical Decision Making Evolving/Moderate complexity    Rehab Potential Good    PT Frequency 2x / week    PT Duration 8 weeks    PT Treatment/Interventions ADLs/Self Care Home Management;Cryotherapy;Moist Heat;Electrical Stimulation;Gait training;Stair training;Functional mobility training;Therapeutic activities;Therapeutic exercise;Balance training;Neuromuscular re-education;Patient/family education;Manual techniques;Taping;Dry needling;Passive range of motion;Spinal Manipulations;Joint Manipulations    PT Next Visit Plan Functional mobility strengthening for LEs and core, work on steps again, NuStep, sit to stand; see what the Sports Med doc says.    PT Home Exercise Plan Access Code: [email or web address]    Consulted and Agree with Plan of Care Patient             Patient will benefit from skilled therapeutic intervention in order to improve the following deficits and impairments:  Abnormal gait, Decreased activity tolerance, Impaired flexibility, Pain, Improper body mechanics, Decreased range of motion, Increased muscle spasms, Difficulty walking, Decreased endurance  Visit Diagnosis: Acute pain of left knee  Other abnormalities of gait and mobility  Acute right-sided low back pain without sciatica  Muscle weakness (generalized)  Cramp and spasm  Problem List Patient Active Problem List   Diagnosis Date Noted   Lumbar radiculopathy 03/28/2020   Osteopenia 08/19/2018   Hyperglycemia  07/09/2018   Hypothyroidism 06/30/2017   Gallbladder polyp 05/20/2016   Fatty liver 05/20/2016   Obesity 05/01/2016   Vitamin D deficiency 07/14/2013   LOW BACK PAIN SYNDROME 01/23/2010   Status post radioactive iodine thyroid ablation 08/17/2009   Hyperlipidemia 07/26/2008   Essential hypertension 07/26/2008   Pain in joint of left shoulder 07/26/2008   Goiter 07/14/2007    Joan [name], PTA 08/17/2021, 8:47 AM  [facility] [address] [place], [name], [address] Phone: [phone]   Fax:  [phone]  Name: Joan [name] MRN: [id number] Date of Birth: [birth year]

## 2021-08-17 NOTE — Progress Notes (Signed)
Joan [name] [employer] [place] Phone: [phone] Subjective:   Joan [name], am serving as a scribe for Dr. Hulan Saas. This visit occurred during the SARS-CoV-2 public health emergency.  Safety protocols were in place, including screening questions prior to the visit, additional usage of staff PPE, and extensive cleaning of exam room while observing appropriate contact time as indicated for disinfecting solutions.   I'm seeing this patient by the request  of:  [name], MD  CC: Back pain  [id number]  Joan [name] is a 65 y.o. female coming in with complaint of right side back pain. Had knee surgery in July and she started using a cane a month after surgery and this made pain worsen. Has had pain in this area before. Pain located R side below bra-line. Takes Aleve 2x a day and ice on back as well. Has been doing PT for knee and back. Also doing water aerobics. Has seen rheumatology and was diagnosed with osteoarthritis.   Xray 07/25/2021 IMPRESSION: Moderate degenerative disc disease in the midthoracic spine. [name] acute findings.  MRI Lumbar 2021 IMPRESSION: 1. Focal disc protrusion into the right neural foramen at L3-4 with an adjacent annular fissure. This soft disc protrusion should affect the right L3 nerve. 2. Severe bilateral facet arthritis at L4-5 with grade 1 spondylolisthesis.     Past Medical History:  Diagnosis Date   Allergy    Anemia    due to Dysmenorrhea   Arthritis    generalized    Hypertension    initially after BCP initiated    Thyroid disease 2005 & 2009   nodule   Past Surgical History:  Procedure Laterality Date   ABDOMINAL HYSTERECTOMY  1998   for heavy menses and fibroids   BIOPSY THYROID  2005,2009   both negative   CESAREAN SECTION     x2   COLONOSCOPY  2009    Dr [facility]   EXPLORATORY LAPAROTOMY  1985   Ovarian cyst   [place]    LLQ pain  due to ovarian cyst   SIGMOIDOSCOPY     TONSILLECTOMY     Social History   Socioeconomic History   Marital status: Married    Spouse name: Not on file   Number of children: Not on file   Years of education: Not on file   Highest education level: Not on file  Occupational History   Not on file  Tobacco Use   Smoking status: Never   Smokeless tobacco: Never  Substance and Sexual Activity   Alcohol use: [name]   Drug use: [name]   Sexual activity: Not Currently  Other Topics Concern   Not on file  Social History Narrative   RN at [employer]      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      Social Determinants of Health   Financial Resource Strain: Not on file  Food Insecurity: Not on file  Transportation Needs: Not on file  Physical Activity: Not on file  Stress: Not on file  Social Connections: Not on file   Allergies  Allergen Reactions   Sulfonamide Derivatives     REACTION: RASH Medi Alert bracelet  is recommended     Codeine Nausea Only   Tramadol Nausea And Vomiting   Family History  Problem Relation Age of Onset   Hypertension Father    Kidney failure Father        Bright's Disease; died @  60   Hypertension Mother    Hyperlipidemia Mother    Osteoarthritis Mother    Hypertension Brother    Diabetes Brother    Stroke Maternal Grandmother        in 72s   Hypertension Maternal Aunt        also DJD/ OA   Breast cancer Maternal Aunt    Heart attack Maternal Grandfather 69       also  CVA   Heart block Paternal Grandmother        pacemaker   Colon cancer Neg Hx    Colon polyps Neg Hx    Esophageal cancer Neg Hx    Rectal cancer Neg Hx    Stomach cancer Neg Hx     Current Outpatient Medications (Endocrine & Metabolic):    estradiol (VIVELLE-DOT) 0.025 MG/24HR, Place 1 patch onto the skin 2 (two) times a week.     estradiol (VIVELLE-DOT) 0.05 MG/24HR patch, APPLY 1 PATCH ONTO THE SKIN 2 TIMES WEEKLY   SYNTHROID 100 MCG tablet, TAKE 1 TABLET BY MOUTH DAILY  Current  Outpatient Medications (Cardiovascular):    amLODipine-benazepril (LOTREL) 5-20 MG capsule, Take 1 capsule by mouth daily. Keep scheduled appt for future refills   triamterene-hydrochlorothiazide (DYAZIDE) 37.5-25 MG capsule, Take 1 each (1 capsule total) by mouth daily.  Current Outpatient Medications (Respiratory):    Loratadine (CLARITIN PO), Take by mouth daily.  Current Outpatient Medications (Analgesics):    naproxen sodium (ALEVE) 220 MG tablet, 1 tablet with food or milk as needed   Current Outpatient Medications (Other):    Vitamin D, Ergocalciferol, (DRISDOL) 1.25 MG (50000 UNIT) CAPS capsule, Take 1 capsule by mouth once a week   Reviewed prior external information including notes and imaging from  primary care provider As well as notes that were available from care everywhere and other healthcare systems.  Reviewed patient's physical therapy notes from prior visits.  Been seen for more of the left knee pain.  Past medical history, social, surgical and family history all reviewed in electronic medical record.  [name] pertanent information unless stated regarding to the chief complaint.   Review of Systems:  [name] headache, visual changes, nausea, vomiting, diarrhea, constipation, dizziness, abdominal pain, skin rash, fevers, chills, night sweats, weight loss, swollen lymph nodes, body aches, joint swelling, chest pain, shortness of breath, mood changes. POSITIVE muscle aches  Objective  Blood pressure 126/88, pulse 83, height 5\' 4"  (1.626 m), weight 202 lb (91.6 kg), SpO2 97 %.   General: [name] apparent distress alert and oriented x3 mood and affect normal, dressed appropriately.  HEENT: Pupils equal, extraocular movements intact  Respiratory: Patient's speak in full sentences and does not appear short of breath  Cardiovascular: [name] lower extremity edema, non tender, [name] erythema  Antalgic gait favoring the left knee.  Does have arthritic changes of the left knee with some instability  with valgus and varus force and a trace effusion noted lacking the last 10 degrees of flexion  Back exam shows significant tightness of the right lower paraspinal musculature of the lumbar spine and the hip flexor.  Tightness also noted over the scapular area in the T7-T8 area as well right greater than left    Impression and Recommendations:    The above documentation has been reviewed and is accurate and complete Lyndal Pulley, DO

## 2021-08-20 ENCOUNTER — Ambulatory Visit: Payer: 59 | Admitting: Family Medicine

## 2021-08-20 ENCOUNTER — Ambulatory Visit (INDEPENDENT_AMBULATORY_CARE_PROVIDER_SITE_OTHER): Payer: 59

## 2021-08-20 ENCOUNTER — Other Ambulatory Visit: Payer: Self-pay

## 2021-08-20 VITALS — BP 126/88 | HR 83 | Ht 64.0 in | Wt 202.0 lb

## 2021-08-20 DIAGNOSIS — M5416 Radiculopathy, lumbar region: Secondary | ICD-10-CM | POA: Diagnosis not present

## 2021-08-20 DIAGNOSIS — M1712 Unilateral primary osteoarthritis, left knee: Secondary | ICD-10-CM | POA: Insufficient documentation

## 2021-08-20 DIAGNOSIS — M25562 Pain in left knee: Secondary | ICD-10-CM | POA: Diagnosis not present

## 2021-08-20 NOTE — Assessment & Plan Note (Signed)
Patient does have low back pain with some mild nerve impingement.  Discussed with patient about the possibility of other injections.  At the moment patient would like to continue with more of a physical therapy.  Patient be referred for the low back as well as some mild scapular dyskinesis noted.  Likely some of it is compensating.  Follow-up again in 4 to 8 weeks

## 2021-08-20 NOTE — Assessment & Plan Note (Signed)
Degenerative left knee.  Discussed icing regimen and home exercises, discussed which activities to do which wants to avoid.  Increase activity slowly.  Follow-up again with me in 6 weeks where we will have approval for viscosupplementation.  Patient has had steroid injections previously from another facility.  Patient wants to avoid any type of surgical intervention if possible

## 2021-08-20 NOTE — Patient Instructions (Signed)
Great to see you  We will send you to [facility] Exercises 3 times a week.  Get knee xray on the way out  We will get you approved for gel injection  Tart cherry extract 1200mg  at night (can be 1000-2000mg ) See me again in 5-6 weeks

## 2021-08-21 ENCOUNTER — Other Ambulatory Visit (HOSPITAL_BASED_OUTPATIENT_CLINIC_OR_DEPARTMENT_OTHER): Payer: Self-pay

## 2021-08-21 ENCOUNTER — Ambulatory Visit: Payer: 59 | Attending: Internal Medicine

## 2021-08-21 DIAGNOSIS — Z23 Encounter for immunization: Secondary | ICD-10-CM

## 2021-08-21 MED ORDER — PFIZER COVID-19 VAC BIVALENT 30 MCG/0.3ML IM SUSP
INTRAMUSCULAR | 0 refills | Status: DC
  Filled 2021-08-21: qty 0.3, 1d supply, fill #0

## 2021-08-21 NOTE — Progress Notes (Signed)
   Covid-19 Vaccination Clinic  Name:  Shaneika Rossa    MRN: [id number] DOB: 12-May-1956  08/21/2021  Ms. Aden was observed post Covid-19 immunization for 15 minutes without incident. She was provided with Vaccine Information Sheet and instruction to access the V-Safe system.   Ms. Knoche was instructed to call 911 with any severe reactions post vaccine: Difficulty breathing  Swelling of face and throat  A fast heartbeat  A bad rash all over body  Dizziness and weakness

## 2021-08-22 ENCOUNTER — Ambulatory Visit: Payer: 59

## 2021-08-22 ENCOUNTER — Other Ambulatory Visit: Payer: Self-pay

## 2021-08-22 DIAGNOSIS — R2689 Other abnormalities of gait and mobility: Secondary | ICD-10-CM

## 2021-08-22 DIAGNOSIS — M6281 Muscle weakness (generalized): Secondary | ICD-10-CM

## 2021-08-22 DIAGNOSIS — M25562 Pain in left knee: Secondary | ICD-10-CM

## 2021-08-22 DIAGNOSIS — M545 Low back pain, unspecified: Secondary | ICD-10-CM | POA: Diagnosis not present

## 2021-08-22 DIAGNOSIS — R252 Cramp and spasm: Secondary | ICD-10-CM

## 2021-08-22 NOTE — Patient Instructions (Signed)
Access Code: [email or web address] URL: [email or web address] Date: 08/22/2021 Prepared by: Claiborne Billings   Doorway Pec Stretch at 90 Degrees Abduction - 1-2 x daily - 7 x weekly - 1 sets - 3 reps - 30 hold Seated Scapular Retraction - 5 x daily - 7 x weekly - 1 sets - 5 reps - 5 hold Shoulder External Rotation with Resistance - 1 x daily - 7 x weekly - 2 sets - 10 reps Shoulder External Rotation in 45 Degrees Abduction - 1 x daily - 7 x weekly - 2 sets - 10 reps Shoulder Extension with Resistance - 1 x daily - 7 x weekly - 2 sets - 10 reps Standing Shoulder Horizontal Abduction with Anchored Resistance - 1 x daily - 7 x weekly - 3 sets - 10 reps

## 2021-08-22 NOTE — Therapy (Signed)
[facility] [address] [place], [name], [address] Phone: [phone]   Fax:  [phone]  Physical Therapy Treatment  Patient Details  Name: Joan [name] MRN: [id number] Date of Birth: 1955/12/04 Referring Provider (PT): Fenton Foy, [place]   Encounter Date: 08/22/2021   PT End of Session - 08/22/21 1057     Visit Number 13    Date for PT Re-Evaluation 09/03/21    Authorization Type UMR- medical review after 25    Authorization - Visit Number 13    Authorization - Number of Visits 25    PT Start Time 9798    PT Stop Time 9211    PT Time Calculation (min) 35 min    Activity Tolerance Patient tolerated treatment well    Behavior During Therapy [facility] for tasks assessed/performed             Past Medical History:  Diagnosis Date   Allergy    Anemia    due to Dysmenorrhea   Arthritis    generalized    Hypertension    initially after BCP initiated    Thyroid disease 2005 & 2009   nodule    Past Surgical History:  Procedure Laterality Date   ABDOMINAL HYSTERECTOMY  1998   for heavy menses and fibroids   BIOPSY THYROID  2005,2009   both negative   CESAREAN SECTION     x2   COLONOSCOPY  2009    Dr Annet[facility]   EXPLORATORY LAPAROTOMY  1985   Ovarian cyst   [place]    LLQ pain due to ovarian cyst   SIGMOIDOSCOPY     TONSILLECTOMY      There were no vitals filed for this visit.   Subjective Assessment - 08/22/21 1023     Subjective I saw Dr Tamala Julian and he wants to do an injection.    Currently in Pain? Yes    Pain Score 5     Pain Location Knee    Pain Orientation Left    Pain Descriptors / Indicators Aching;Sore    Pain Type Surgical pain    Pain Onset More than a month ago    Pain Frequency Intermittent    Aggravating Factors  walking    Pain Relieving Factors ice    Pain Score 6    Pain Location Back    Pain Descriptors / Indicators Aching;Sore;Spasm     Pain Type Chronic pain    Pain Onset More than a month ago    Pain Frequency Constant    Aggravating Factors  unknown    Pain Relieving Factors ice, rest                               [facility] Adult PT Treatment/Exercise - 08/22/21 0001       Lumbar Exercises: Standing   Row Limitations ER with red band 2x10    Other Standing Lumbar Exercises Pec stretch in doorway 3x20 seconds    Other Standing Lumbar Exercises theraband in door: W, I and T as prescribed by MD this week- tactile cues for posture, alignment and reduced use of accessory muscles      Lumbar Exercises: Seated   Other Seated Lumbar Exercises seated on green ball: pelvic rocking and circles x 10 each    Other Seated Lumbar Exercises ball roll outs in sitting x5      Lumbar Exercises:  Supine   Other Supine Lumbar Exercises legs on ball- rotation x 10                     PT Education - 08/22/21 1045     Education Details Access Code: [facility]    Person(s) Educated Patient    Methods Explanation;Demonstration;Handout    Comprehension Verbalized understanding;Returned demonstration              PT Short Term Goals - 08/13/21 0917       PT SHORT TERM GOAL #3   Title demonstrate symmetry on level surfaces due to reduced LBP and Lt knee pain    Time 4    Period Weeks    Status Achieved    Target Date 09/03/21               PT Long Term Goals - 07/09/21 1617       PT LONG TERM GOAL #1   Title be independent in an advanced HEP    Time 8    Period Weeks    Status New    Target Date 09/03/21      PT LONG TERM GOAL #2   Title report > or = to 70% reduction in Lt knee and LBP with standing and walking to improve community function    Time 8    Period Weeks    Status New    Target Date 09/03/21      PT LONG TERM GOAL #3   Title increased FOTO (knee) to > or = to 61    Time 8    Period Weeks    Status New    Target Date 09/03/21      PT LONG TERM GOAL #4   Title  demonstrate > or = to 4+/5 to 5/5 Lt hip and knee strength to improve safety and and endurance in the community    Time 8    Period Weeks    Status New    Target Date 09/03/21      PT LONG TERM GOAL #5   Title improve Lt knee strength to negotiate steps with step-over-step gait with use of 1 rail    Time 8    Period Weeks    Status New    Target Date 09/03/21                   Plan - 08/22/21 1056     Clinical Impression Statement Pt saw sports medicine doctor yesterday and he gave her some exercises for upper thoracic strength.  PT spent session going over all exercises and provided tactile cues for alignment and to reduce activation of accessory motions.  Pt will return tomorrow for further review.  Pt will continue to benefit from skilled PT to address lumbar and knee pain.    PT Treatment/Interventions ADLs/Self Care Home Management;Cryotherapy;Moist Heat;Electrical Stimulation;Gait training;Stair training;Functional mobility training;Therapeutic activities;Therapeutic exercise;Balance training;Neuromuscular re-education;Patient/family education;Manual techniques;Taping;Dry needling;Passive range of motion;Spinal Manipulations;Joint Manipulations    PT Next Visit Plan further review of scapular strength, knee strength    PT Home Exercise Plan Access Code: [email or web address]    Consulted and Agree with Plan of Care Patient             Patient will benefit from skilled therapeutic intervention in order to improve the following deficits and impairments:  Abnormal gait, Decreased activity tolerance, Impaired flexibility, Pain, Improper body mechanics, Decreased range of motion, Increased muscle spasms, Difficulty walking, Decreased endurance  Visit Diagnosis: Acute pain of left knee  Other abnormalities of gait and mobility  Acute right-sided low back pain without sciatica  Muscle weakness (generalized)  Cramp and spasm     Problem List Patient Active Problem List    Diagnosis Date Noted   Degenerative arthritis of left knee 08/20/2021   Lumbar radiculopathy 03/28/2020   Osteopenia 08/19/2018   Hyperglycemia 07/09/2018   Hypothyroidism 06/30/2017   Gallbladder polyp 05/20/2016   Fatty liver 05/20/2016   Obesity 05/01/2016   Vitamin D deficiency 07/14/2013   LOW BACK PAIN SYNDROME 01/23/2010   Status post radioactive iodine thyroid ablation 08/17/2009   Hyperlipidemia 07/26/2008   Essential hypertension 07/26/2008   Pain in joint of left shoulder 07/26/2008   Goiter 07/14/2007   Sigurd Sos, PT 08/22/21 10:58 AM   [facility] [address] [place], [name], [address] Phone: [phone]   Fax:  [phone]  Name: Jennilyn Esteve MRN: [id number] Date of Birth: [birth year]

## 2021-08-23 ENCOUNTER — Ambulatory Visit: Payer: 59

## 2021-08-23 DIAGNOSIS — M25562 Pain in left knee: Secondary | ICD-10-CM | POA: Diagnosis not present

## 2021-08-23 DIAGNOSIS — R2689 Other abnormalities of gait and mobility: Secondary | ICD-10-CM | POA: Diagnosis not present

## 2021-08-23 DIAGNOSIS — M6281 Muscle weakness (generalized): Secondary | ICD-10-CM

## 2021-08-23 DIAGNOSIS — M545 Low back pain, unspecified: Secondary | ICD-10-CM

## 2021-08-23 DIAGNOSIS — R252 Cramp and spasm: Secondary | ICD-10-CM | POA: Diagnosis not present

## 2021-08-23 NOTE — Therapy (Signed)
[facility] [address], [place], [name], [address] Phone: [phone]   Fax:  [phone]  Physical Therapy Treatment  Patient Details  Name: Joan [name] MRN: [id number] Date of Birth: 12/24/1955 Referring Provider (PT): Fenton Foy, [place]   Encounter Date: 08/23/2021   PT End of Session - 08/23/21 0843     Visit Number 14    Date for PT Re-Evaluation 09/03/21    Authorization Type UMR- medical review after 25    Authorization - Visit Number 14    Authorization - Number of Visits 25    PT Start Time 0801    PT Stop Time 7989    PT Time Calculation (min) 48 min    Activity Tolerance Patient tolerated treatment well    Behavior During Therapy [facility] for tasks assessed/performed             Past Medical History:  Diagnosis Date   Allergy    Anemia    due to Dysmenorrhea   Arthritis    generalized    Hypertension    initially after BCP initiated    Thyroid disease 2005 & 2009   nodule    Past Surgical History:  Procedure Laterality Date   ABDOMINAL HYSTERECTOMY  1998   for heavy menses and fibroids   BIOPSY THYROID  2005,2009   both negative   CESAREAN SECTION     x2   COLONOSCOPY  2009    Dr [facility]   EXPLORATORY LAPAROTOMY  1985   Ovarian cyst   [place]    LLQ pain due to ovarian cyst   SIGMOIDOSCOPY     TONSILLECTOMY      There were no vitals filed for this visit.   Subjective Assessment - 08/23/21 0803     Subjective My back is still very painful.  My knee hurts due to the weather.    Pain Score 7    Pain Location Back    Pain Orientation Left    Pain Descriptors / Indicators Aching;Sore;Spasm    Pain Type Chronic pain                               [facility] Adult PT Treatment/Exercise - 08/23/21 0001       Lumbar Exercises: Standing   Row Limitations ER with red band x 10    Other Standing Lumbar Exercises theraband in  door: W, I and T as prescribed by MD this week- x 10 reps tactile cues for posture, alignment and reduced use of accessory muscles      Lumbar Exercises: Seated   Other Seated Lumbar Exercises ball roll outs in sitting x10      Knee/Hip Exercises: Aerobic   Nustep L2 x 6 min for knee warm up and discussion of current status      Knee/Hip Exercises: Seated   Sit to Sand without UE support;20 reps   holding 5# KB     Modalities   Modalities Cryotherapy      Cryotherapy   Number Minutes Cryotherapy 10 Minutes      Manual Therapy   Manual Therapy Myofascial release;Soft tissue mobilization    Manual therapy comments elongation to Rt>Lt thoracolumbar musculature, suction cup used              Trigger Point Dry Needling - 08/23/21 0001     Consent Given? Yes  Education Handout Provided Previously provided    Muscles Treated Back/Hip Thoracic multifidi;Lumbar multifidi   Rt only   Lumbar multifidi Response Palpable increased muscle length;Twitch response elicited    Thoracic multifidi response Twitch response elicited;Palpable increased muscle length                     PT Short Term Goals - 08/23/21 0804       PT SHORT TERM GOAL #1   Title be independent in initial HEP    Status Achieved      PT SHORT TERM GOAL #2   Title reduce Lt knee and low back pain to stand and walk for 1 hour without increased pain or limitation    Baseline Pt with significant Lt knee pain with standing and walking    Status On-going      PT SHORT TERM GOAL #3   Title demonstrate symmetry on level surfaces due to reduced LBP and Lt knee pain    Baseline intermittent      PT SHORT TERM GOAL #4   Title improve Lt knee strength to ascend steps with step-over-step pattern with use of 1 rail    Baseline still step-to due to Lt knee pain    Status On-going               PT Long Term Goals - 07/09/21 1617       PT LONG TERM GOAL #1   Title be independent in an advanced HEP     Time 8    Period Weeks    Status New    Target Date 09/03/21      PT LONG TERM GOAL #2   Title report > or = to 70% reduction in Lt knee and LBP with standing and walking to improve community function    Time 8    Period Weeks    Status New    Target Date 09/03/21      PT LONG TERM GOAL #3   Title increased FOTO (knee) to > or = to 61    Time 8    Period Weeks    Status New    Target Date 09/03/21      PT LONG TERM GOAL #4   Title demonstrate > or = to 4+/5 to 5/5 Lt hip and knee strength to improve safety and and endurance in the community    Time 8    Period Weeks    Status New    Target Date 09/03/21      PT LONG TERM GOAL #5   Title improve Lt knee strength to negotiate steps with step-over-step gait with use of 1 rail    Time 8    Period Weeks    Status New    Target Date 09/03/21                   Plan - 08/23/21 0816     Clinical Impression Statement Pt was able to participate in low level exercise today and tolerated well.  Continued tactile cueing required for standing band exercises due to substitution with accessory muscles.  Pt with continued Lt knee pain that limits ambulation and steps.  Pt with tension and trigger points in the Rt thoracic spine and had good twitch response with DN and manual therapy today.  Pt will continue to benefit from skilled PT to address Rt sided thoracic pain and Lt knee pain.    Rehab Potential Good  PT Frequency 2x / week    PT Duration 8 weeks    PT Treatment/Interventions ADLs/Self Care Home Management;Cryotherapy;Moist Heat;Electrical Stimulation;Gait training;Stair training;Functional mobility training;Therapeutic activities;Therapeutic exercise;Balance training;Neuromuscular re-education;Patient/family education;Manual techniques;Taping;Dry needling;Passive range of motion;Spinal Manipulations;Joint Manipulations    PT Home Exercise Plan assess if DN helped, continue thoracic strength, Lt knee strength     Consulted and Agree with Plan of Care Patient             Patient will benefit from skilled therapeutic intervention in order to improve the following deficits and impairments:  Abnormal gait, Decreased activity tolerance, Impaired flexibility, Pain, Improper body mechanics, Decreased range of motion, Increased muscle spasms, Difficulty walking, Decreased endurance  Visit Diagnosis: Acute pain of left knee  Other abnormalities of gait and mobility  Acute right-sided low back pain without sciatica  Muscle weakness (generalized)     Problem List Patient Active Problem List   Diagnosis Date Noted   Degenerative arthritis of left knee 08/20/2021   Lumbar radiculopathy 03/28/2020   Osteopenia 08/19/2018   Hyperglycemia 07/09/2018   Hypothyroidism 06/30/2017   Gallbladder polyp 05/20/2016   Fatty liver 05/20/2016   Obesity 05/01/2016   Vitamin D deficiency 07/14/2013   LOW BACK PAIN SYNDROME 01/23/2010   Status post radioactive iodine thyroid ablation 08/17/2009   Hyperlipidemia 07/26/2008   Essential hypertension 07/26/2008   Pain in joint of left shoulder 07/26/2008   Goiter 07/14/2007    Sigurd Sos, PT 08/23/21 8:47 AM  [facility] [address], [place], [name], [address] Phone: [phone]   Fax:  [phone]  Name: Joan [name] MRN: [id number] Date of Birth: 12/07/1955

## 2021-08-27 ENCOUNTER — Other Ambulatory Visit (HOSPITAL_COMMUNITY): Payer: Self-pay

## 2021-08-29 ENCOUNTER — Ambulatory Visit: Payer: 59 | Attending: Orthopedic Surgery

## 2021-08-29 ENCOUNTER — Other Ambulatory Visit: Payer: Self-pay

## 2021-08-29 DIAGNOSIS — M6281 Muscle weakness (generalized): Secondary | ICD-10-CM | POA: Diagnosis not present

## 2021-08-29 DIAGNOSIS — M25562 Pain in left knee: Secondary | ICD-10-CM | POA: Diagnosis not present

## 2021-08-29 DIAGNOSIS — R252 Cramp and spasm: Secondary | ICD-10-CM | POA: Insufficient documentation

## 2021-08-29 DIAGNOSIS — R2689 Other abnormalities of gait and mobility: Secondary | ICD-10-CM | POA: Diagnosis not present

## 2021-08-29 DIAGNOSIS — M545 Low back pain, unspecified: Secondary | ICD-10-CM | POA: Diagnosis not present

## 2021-08-29 NOTE — Therapy (Signed)
[facility] @ [place], [name], [address] Phone:     Fax:     Physical Therapy Treatment  Patient Details  Name: Joan [name] MRN: [id number] Date of Birth: 1956-11-13 Referring Provider (PT): Fenton Foy, [place]   Encounter Date: 08/29/2021   PT End of Session - 08/29/21 0843     Visit Number 15    Date for PT Re-Evaluation 09/03/21    Authorization Type UMR- medical review after 25    Authorization - Visit Number 15    Authorization - Number of Visits 25    PT Start Time 0808    PT Stop Time 0843    PT Time Calculation (min) 35 min    Activity Tolerance Patient tolerated treatment well    Behavior During Therapy [facility] for tasks assessed/performed             Past Medical History:  Diagnosis Date   Allergy    Anemia    due to Dysmenorrhea   Arthritis    generalized    Hypertension    initially after BCP initiated    Thyroid disease 2005 & 2009   nodule    Past Surgical History:  Procedure Laterality Date   ABDOMINAL HYSTERECTOMY  1998   for heavy menses and fibroids   BIOPSY THYROID  2005,2009   both negative   CESAREAN SECTION     x2   COLONOSCOPY  2009    Dr Annette [facility]   EXPLORATORY LAPAROTOMY  1985   Ovarian cyst   [place]    LLQ pain due to ovarian cyst   SIGMOIDOSCOPY     TONSILLECTOMY      There were no vitals filed for this visit.   Subjective Assessment - 08/29/21 0810     Subjective My back is still really hurting me.  I felt a little bit better after needling but it didn't last.    Pertinent History LT knee scope 05/29/21    Currently in Pain? Yes    Pain Score 7     Pain Location Knee    Pain Orientation Left    Pain Descriptors / Indicators Aching;Sore    Pain Onset More than a month ago    Pain Frequency Intermittent    Aggravating Factors  walking, standing, activity    Pain Relieving Factors ice, rest    Pain Score 7    Pain  Location Back    Pain Orientation Lower;Left;Right    Pain Descriptors / Indicators Aching;Sore;Spasm    Pain Type Chronic pain    Pain Onset More than a month ago    Pain Frequency Constant                               [facility] Adult PT Treatment/Exercise - 08/29/21 0001       Lumbar Exercises: Standing   Row Limitations ER with red band seated on ball 2x10    Other Standing Lumbar Exercises Pec stretch in doorway 3x20 seconds      Lumbar Exercises: Seated   Other Seated Lumbar Exercises ball roll outs in sitting x10      Lumbar Exercises: Supine   Other Supine Lumbar Exercises seated on ball: red 3 way raises 1# 2x10      Knee/Hip Exercises: Aerobic   Nustep L2 x 8 min for knee warm up and discussion  of current status      Knee/Hip Exercises: Standing   Heel Raises Both;3 sets;10 reps   VC to pay attention to quad contracting during the exercise.   Rocker Board --                       PT Short Term Goals - 08/23/21 0804       PT SHORT TERM GOAL #1   Title be independent in initial HEP    Status Achieved      PT SHORT TERM GOAL #2   Title reduce Lt knee and low back pain to stand and walk for 1 hour without increased pain or limitation    Baseline Pt with significant Lt knee pain with standing and walking    Status On-going      PT SHORT TERM GOAL #3   Title demonstrate symmetry on level surfaces due to reduced LBP and Lt knee pain    Baseline intermittent      PT SHORT TERM GOAL #4   Title improve Lt knee strength to ascend steps with step-over-step pattern with use of 1 rail    Baseline still step-to due to Lt knee pain    Status On-going               PT Long Term Goals - 07/09/21 1617       PT LONG TERM GOAL #1   Title be independent in an advanced HEP    Time 8    Period Weeks    Status New    Target Date 09/03/21      PT LONG TERM GOAL #2   Title report > or = to 70% reduction in Lt knee and LBP with standing  and walking to improve community function    Time 8    Period Weeks    Status New    Target Date 09/03/21      PT LONG TERM GOAL #3   Title increased FOTO (knee) to > or = to 61    Time 8    Period Weeks    Status New    Target Date 09/03/21      PT LONG TERM GOAL #4   Title demonstrate > or = to 4+/5 to 5/5 Lt hip and knee strength to improve safety and and endurance in the community    Time 8    Period Weeks    Status New    Target Date 09/03/21      PT LONG TERM GOAL #5   Title improve Lt knee strength to negotiate steps with step-over-step gait with use of 1 rail    Time 8    Period Weeks    Status New    Target Date 09/03/21                   Plan - 08/29/21 0835     Clinical Impression Statement Pt reports no change in her symptoms related to thoracic pain and Lt knee pain.  Session focused on postural strength and flexibility to build associated musculature.  Pt required intermittent cueing for core activation and scapular alignment.   Pt will have follow-up with Sports Medicine MD in 2 weeks.  Pt will continue to benefit from skilled PT to address lumbar and knee pain.    PT Frequency 2x / week    PT Duration 8 weeks    PT Treatment/Interventions ADLs/Self Care Home Management;Cryotherapy;Moist Heat;Electrical Stimulation;Gait  training;Stair training;Functional mobility training;Therapeutic activities;Therapeutic exercise;Balance training;Neuromuscular re-education;Patient/family education;Manual techniques;Taping;Dry needling;Passive range of motion;Spinal Manipulations;Joint Manipulations    PT Next Visit Plan core strength, scapular strength, comprehensive knee strength    Consulted and Agree with Plan of Care Patient             Patient will benefit from skilled therapeutic intervention in order to improve the following deficits and impairments:  Abnormal gait, Decreased activity tolerance, Impaired flexibility, Pain, Improper body mechanics, Decreased  range of motion, Increased muscle spasms, Difficulty walking, Decreased endurance  Visit Diagnosis: Acute pain of left knee  Other abnormalities of gait and mobility  Acute right-sided low back pain without sciatica  Muscle weakness (generalized)  Cramp and spasm     Problem List Patient Active Problem List   Diagnosis Date Noted   Degenerative arthritis of left knee 08/20/2021   Lumbar radiculopathy 03/28/2020   Osteopenia 08/19/2018   Hyperglycemia 07/09/2018   Hypothyroidism 06/30/2017   Gallbladder polyp 05/20/2016   Fatty liver 05/20/2016   Obesity 05/01/2016   Vitamin D deficiency 07/14/2013   LOW BACK PAIN SYNDROME 01/23/2010   Status post radioactive iodine thyroid ablation 08/17/2009   Hyperlipidemia 07/26/2008   Essential hypertension 07/26/2008   Pain in joint of left shoulder 07/26/2008   Goiter 07/14/2007   Sigurd Sos, PT 08/29/21 8:45 AM  [facility] @ [place], [name], [address] Phone:     Fax:     Name: Jewelene Mairena MRN: [id number] Date of Birth: Jan 07, 1956

## 2021-08-30 ENCOUNTER — Ambulatory Visit: Payer: 59

## 2021-08-30 DIAGNOSIS — R2689 Other abnormalities of gait and mobility: Secondary | ICD-10-CM

## 2021-08-30 DIAGNOSIS — M545 Low back pain, unspecified: Secondary | ICD-10-CM | POA: Diagnosis not present

## 2021-08-30 DIAGNOSIS — M6281 Muscle weakness (generalized): Secondary | ICD-10-CM | POA: Diagnosis not present

## 2021-08-30 DIAGNOSIS — M25562 Pain in left knee: Secondary | ICD-10-CM | POA: Diagnosis not present

## 2021-08-30 DIAGNOSIS — R252 Cramp and spasm: Secondary | ICD-10-CM | POA: Diagnosis not present

## 2021-08-30 NOTE — Therapy (Signed)
[facility] @ [place], [name], [address] Phone:     Fax:     Physical Therapy Treatment  Patient Details  Name: Joan [name] MRN: [id number] Date of Birth: 01-Aug-1956 Referring Provider (PT): Fenton Foy, [place]   Encounter Date: 08/30/2021   PT End of Session - 08/30/21 0927     Visit Number 16    Date for PT Re-Evaluation 09/03/21    Authorization Type UMR- medical review after 25    Authorization - Visit Number 16    Authorization - Number of Visits 25    PT Start Time 0850    PT Stop Time 0928    PT Time Calculation (min) 38 min    Activity Tolerance Patient tolerated treatment well    Behavior During Therapy [facility] for tasks assessed/performed             Past Medical History:  Diagnosis Date   Allergy    Anemia    due to Dysmenorrhea   Arthritis    generalized    Hypertension    initially after BCP initiated    Thyroid disease 2005 & 2009   nodule    Past Surgical History:  Procedure Laterality Date   ABDOMINAL HYSTERECTOMY  1998   for heavy menses and fibroids   BIOPSY THYROID  2005,2009   both negative   CESAREAN SECTION     x2   COLONOSCOPY  2009    Dr Annet[facility]   EXPLORATORY LAPAROTOMY  1985   Ovarian cyst   [place]    LLQ pain due to ovarian cyst   SIGMOIDOSCOPY     TONSILLECTOMY      There were no vitals filed for this visit.   Subjective Assessment - 08/30/21 0858     Subjective My back is really hurting.  I'm going to call Dr Tamala Julian and let him know.    Patient Stated Goals improve Lt knee strength, improve gait    Currently in Pain? Yes    Pain Score 7    Pain Location Back    Pain Orientation Right;Mid;Lower    Pain Descriptors / Indicators Aching;Sore;Spasm                               [facility] Adult PT Treatment/Exercise - 08/30/21 0001       Lumbar Exercises: Standing   Row Strengthening;Both;20  reps;Theraband    Theraband Level (Row) Level 2 (Red)    Shoulder Extension Strengthening;Both;20 reps;Theraband    Theraband Level (Shoulder Extension) Level 2 (Red)    Other Standing Lumbar Exercises Pec stretch in doorway 3x20 seconds      Lumbar Exercises: Seated   Other Seated Lumbar Exercises ball roll outs in sitting x10, 5 to each side      Lumbar Exercises: Supine   Other Supine Lumbar Exercises seated on ball: red flexion 1# 2x10, ER with red band 2x10      Knee/Hip Exercises: Aerobic   Nustep L2 x 8 min for knee warm up and discussion of current status      Knee/Hip Exercises: Standing   Heel Raises Both;3 sets;10 reps   VC to pay attention to quad contracting during the exercise.     Knee/Hip Exercises: Seated   Sit to Sand without UE support;20 reps   holding 5# KB  PT Short Term Goals - 08/23/21 0804       PT SHORT TERM GOAL #1   Title be independent in initial HEP    Status Achieved      PT SHORT TERM GOAL #2   Title reduce Lt knee and low back pain to stand and walk for 1 hour without increased pain or limitation    Baseline Pt with significant Lt knee pain with standing and walking    Status On-going      PT SHORT TERM GOAL #3   Title demonstrate symmetry on level surfaces due to reduced LBP and Lt knee pain    Baseline intermittent      PT SHORT TERM GOAL #4   Title improve Lt knee strength to ascend steps with step-over-step pattern with use of 1 rail    Baseline still step-to due to Lt knee pain    Status On-going               PT Long Term Goals - 07/09/21 1617       PT LONG TERM GOAL #1   Title be independent in an advanced HEP    Time 8    Period Weeks    Status New    Target Date 09/03/21      PT LONG TERM GOAL #2   Title report > or = to 70% reduction in Lt knee and LBP with standing and walking to improve community function    Time 8    Period Weeks    Status New    Target Date 09/03/21       PT LONG TERM GOAL #3   Title increased FOTO (knee) to > or = to 61    Time 8    Period Weeks    Status New    Target Date 09/03/21      PT LONG TERM GOAL #4   Title demonstrate > or = to 4+/5 to 5/5 Lt hip and knee strength to improve safety and and endurance in the community    Time 8    Period Weeks    Status New    Target Date 09/03/21      PT LONG TERM GOAL #5   Title improve Lt knee strength to negotiate steps with step-over-step gait with use of 1 rail    Time 8    Period Weeks    Status New    Target Date 09/03/21                   Plan - 08/30/21 0906     Clinical Impression Statement Pt arrived with significant Rt sided low back pain.  Pt is receptive to exercise today as this does not exacerbate her symptoms.   Session focused on postural strength and flexibility to build associated musculature.  Pt is benefitting from overall strength progression as this will help her chronic symptoms.  Pt required intermittent cueing for core activation and scapular alignment.   Pt will have follow-up with Sports Medicine MD in 2 weeks although has considered reaching out sooner due to continued pain.  Pt will continue to benefit from skilled PT to address lumbar and knee pain.  ERO next week.    PT Frequency 2x / week    PT Duration 8 weeks    PT Treatment/Interventions ADLs/Self Care Home Management;Cryotherapy;Moist Heat;Electrical Stimulation;Gait training;Stair training;Functional mobility training;Therapeutic activities;Therapeutic exercise;Balance training;Neuromuscular re-education;Patient/family education;Manual techniques;Taping;Dry needling;Passive range of motion;Spinal Manipulations;Joint Manipulations  PT Next Visit Plan core strength, scapular strength, comprehensive knee strength    Consulted and Agree with Plan of Care Patient             Patient will benefit from skilled therapeutic intervention in order to improve the following deficits and impairments:   Abnormal gait, Decreased activity tolerance, Impaired flexibility, Pain, Improper body mechanics, Decreased range of motion, Increased muscle spasms, Difficulty walking, Decreased endurance  Visit Diagnosis: Acute pain of left knee  Other abnormalities of gait and mobility  Acute right-sided low back pain without sciatica  Muscle weakness (generalized)  Cramp and spasm     Problem List Patient Active Problem List   Diagnosis Date Noted   Degenerative arthritis of left knee 08/20/2021   Lumbar radiculopathy 03/28/2020   Osteopenia 08/19/2018   Hyperglycemia 07/09/2018   Hypothyroidism 06/30/2017   Gallbladder polyp 05/20/2016   Fatty liver 05/20/2016   Obesity 05/01/2016   Vitamin D deficiency 07/14/2013   LOW BACK PAIN SYNDROME 01/23/2010   Status post radioactive iodine thyroid ablation 08/17/2009   Hyperlipidemia 07/26/2008   Essential hypertension 07/26/2008   Pain in joint of left shoulder 07/26/2008   Goiter 07/14/2007   Sigurd Sos, PT 08/30/21 9:30 AM   [facility] @ [place], [name], [address] Phone:     Fax:     Name: Jonessa Triplett MRN: [id number] Date of Birth: 1956/04/28

## 2021-09-04 ENCOUNTER — Ambulatory Visit: Payer: 59

## 2021-09-04 ENCOUNTER — Other Ambulatory Visit: Payer: Self-pay

## 2021-09-04 DIAGNOSIS — M6281 Muscle weakness (generalized): Secondary | ICD-10-CM | POA: Diagnosis not present

## 2021-09-04 DIAGNOSIS — R2689 Other abnormalities of gait and mobility: Secondary | ICD-10-CM

## 2021-09-04 DIAGNOSIS — R252 Cramp and spasm: Secondary | ICD-10-CM | POA: Diagnosis not present

## 2021-09-04 DIAGNOSIS — M545 Low back pain, unspecified: Secondary | ICD-10-CM | POA: Diagnosis not present

## 2021-09-04 DIAGNOSIS — M25562 Pain in left knee: Secondary | ICD-10-CM | POA: Diagnosis not present

## 2021-09-04 NOTE — Therapy (Signed)
[facility] @ [place], [name], [address] Phone: [phone]   Fax:  [phone]  Physical Therapy Treatment  Patient Details  Name: Joan [name] MRN: [id number] Date of Birth: 1956-07-16 Referring Provider (PT): Fenton Foy, Purnell Shoemaker, MD   Encounter Date: 09/04/2021   PT End of Session - 09/04/21 0801     Visit Number 17    Date for PT Re-Evaluation 10/02/21    Authorization Type UMR- medical review after 25    Authorization - Visit Number 17    Authorization - Number of Visits 25    PT Start Time 0730    PT Stop Time 0802    PT Time Calculation (min) 32 min    Activity Tolerance Patient tolerated treatment well    Behavior During Therapy [facility] for tasks assessed/performed             Past Medical History:  Diagnosis Date   Allergy    Anemia    due to Dysmenorrhea   Arthritis    generalized    Hypertension    initially after BCP initiated    Thyroid disease 2005 & 2009   nodule    Past Surgical History:  Procedure Laterality Date   ABDOMINAL HYSTERECTOMY  1998   for heavy menses and fibroids   BIOPSY THYROID  2005,2009   both negative   CESAREAN SECTION     x2   COLONOSCOPY  2009    Dr [facility]   EXPLORATORY LAPAROTOMY  1985   Ovarian cyst   [place]    LLQ pain due to ovarian cyst   SIGMOIDOSCOPY     TONSILLECTOMY      There were no vitals filed for this visit.   Subjective Assessment - 09/04/21 0737     Subjective I had a busy weekend.  Things feel about the same with my back.  I didn't reach out to the MD.    Currently in Pain? Yes    Pain Score 6     Pain Location Knee    Pain Orientation Left    Pain Descriptors / Indicators Aching;Sore    Pain Type Chronic pain    Pain Onset More than a month ago    Pain Frequency Intermittent    Aggravating Factors  movement, walking, standing    Pain Relieving Factors ice, rest    Pain  Score 8    Pain Location Back    Pain Orientation Right;Mid;Lower    Pain Onset More than a month ago    Pain Frequency Constant    Aggravating Factors  constant    Pain Relieving Factors ice, rest                [facility] PT Assessment - 09/04/21 0001       Assessment   Medical Diagnosis s/p Lt knee arthroscopy with meniscal debridement, LBP    Referring Provider (PT) Fenton Foy, Purnell Shoemaker, MD    Onset Date/Surgical Date 05/29/21      Prior Function   Level of Independence Independent    Vocation Full time employment      Cognition   Overall Cognitive Status Within Functional Limits for tasks assessed      Observation/Other Assessments   Focus on Therapeutic Outcomes (FOTO)  44 (goal is 61)      Strength   Overall Strength Comments Lt knee 5/5, 4/5 hip  [facility] Adult PT Treatment/Exercise - 09/04/21 0001       Lumbar Exercises: Standing   Row Strengthening;Both;20 reps;Theraband    Theraband Level (Row) Level 2 (Red)    Shoulder Extension Strengthening;Both;20 reps;Theraband    Theraband Level (Shoulder Extension) Level 2 (Red)      Lumbar Exercises: Seated   Other Seated Lumbar Exercises ball roll outs in sitting x10, 5 to each side      Knee/Hip Exercises: Aerobic   Nustep L2 x 8 min for knee warm up and discussion of current status      Knee/Hip Exercises: Standing   Heel Raises Both;3 sets;10 reps   VC to pay attention to quad contracting during the exercise.     Knee/Hip Exercises: Seated   Sit to Sand without UE support;20 reps   holding 5# KB                      PT Short Term Goals - 08/23/21 0804       PT SHORT TERM GOAL #1   Title be independent in initial HEP    Status Achieved      PT SHORT TERM GOAL #2   Title reduce Lt knee and low back pain to stand and walk for 1 hour without increased pain or limitation    Baseline Pt with significant Lt knee pain with standing and walking     Status On-going      PT SHORT TERM GOAL #3   Title demonstrate symmetry on level surfaces due to reduced LBP and Lt knee pain    Baseline intermittent      PT SHORT TERM GOAL #4   Title improve Lt knee strength to ascend steps with step-over-step pattern with use of 1 rail    Baseline still step-to due to Lt knee pain    Status On-going               PT Long Term Goals - 09/04/21 0741       PT LONG TERM GOAL #1   Title be independent in an advanced HEP    Time 4    Period Weeks    Status On-going    Target Date 10/02/21      PT LONG TERM GOAL #2   Title report > or = to 70% reduction in Lt knee and LBP with standing and walking to improve community function    Baseline Lt knee 30% better- feels stronger,  some improvement in Low back a few weeks ago.  Now no change      PT LONG TERM GOAL #3   Title increased FOTO (knee) to > or = to 61    Baseline 44- Low back (no change)    Time 6    Period Weeks    Status On-going    Target Date 10/02/21      PT LONG TERM GOAL #4   Title demonstrate > or = to 4+/5 to 5/5 Lt hip and knee strength to improve safety and and endurance in the community    Baseline Lt knee 5/5, hip 4/5    Time 4    Period Weeks    Status On-going    Target Date 10/02/21      PT LONG TERM GOAL #5   Title improve Lt knee strength to negotiate steps with step-over-step gait with use of 1 rail    Baseline 40% of the time- step-over-step    Time 4  Period Weeks    Status On-going    Target Date 10/02/21                   Plan - 09/04/21 0756     Clinical Impression Statement Pt reports little to no improvement in back symptoms.  Lt knee is 30% better and feels stronger overall.  Pt is able to negotiate steps with step-over-step gait 40% of the time now.  Pt is receptive to exercise today as this does not exacerbate her symptoms.  Pt with improved and full Lt knee strength and hip strength remains limited.  Session focused on postural  strength and flexibility to build associated musculature.  Pt is benefitting from overall strength progression as this will help her chronic symptoms.  Pt required intermittent cueing for core activation and scapular alignment.   Pt will continue to benefit from skilled PT to address lumbar and knee pain.    Rehab Potential Good    PT Frequency 2x / week    PT Duration 4 weeks    PT Treatment/Interventions ADLs/Self Care Home Management;Cryotherapy;Moist Heat;Electrical Stimulation;Gait training;Stair training;Functional mobility training;Therapeutic activities;Therapeutic exercise;Balance training;Neuromuscular re-education;Patient/family education;Manual techniques;Taping;Dry needling;Passive range of motion;Spinal Manipulations;Joint Manipulations    PT Next Visit Plan core strength, scapular strength, comprehensive knee strength, address LBP as needed    PT Home Exercise Plan recert sent to Dr Richarda Blade and Agree with Plan of Care Patient             Patient will benefit from skilled therapeutic intervention in order to improve the following deficits and impairments:  Abnormal gait, Decreased activity tolerance, Impaired flexibility, Pain, Improper body mechanics, Decreased range of motion, Increased muscle spasms, Difficulty walking, Decreased endurance  Visit Diagnosis: Other abnormalities of gait and mobility - Plan: PT plan of care cert/re-cert  Acute pain of left knee - Plan: PT plan of care cert/re-cert  Acute right-sided low back pain without sciatica - Plan: PT plan of care cert/re-cert  Muscle weakness (generalized) - Plan: PT plan of care cert/re-cert  Cramp and spasm - Plan: PT plan of care cert/re-cert     Problem List Patient Active Problem List   Diagnosis Date Noted   Degenerative arthritis of left knee 08/20/2021   Lumbar radiculopathy 03/28/2020   Osteopenia 08/19/2018   Hyperglycemia 07/09/2018   Hypothyroidism 06/30/2017   Gallbladder polyp  05/20/2016   Fatty liver 05/20/2016   Obesity 05/01/2016   Vitamin D deficiency 07/14/2013   LOW BACK PAIN SYNDROME 01/23/2010   Status post radioactive iodine thyroid ablation 08/17/2009   Hyperlipidemia 07/26/2008   Essential hypertension 07/26/2008   Pain in joint of left shoulder 07/26/2008   Goiter 07/14/2007   Sigurd Sos, PT 09/04/21 8:04 AM   [facility] @ [place], [name], [address] Phone: [phone]   Fax:  [phone]  Name: Joan [name] MRN: [id number] Date of Birth: September 14, 1956

## 2021-09-06 ENCOUNTER — Ambulatory Visit: Payer: 59

## 2021-09-06 ENCOUNTER — Other Ambulatory Visit: Payer: Self-pay

## 2021-09-06 ENCOUNTER — Ambulatory Visit
Admission: RE | Admit: 2021-09-06 | Discharge: 2021-09-06 | Disposition: A | Discharge status: Home / Self Care | Payer: 59 | Source: Ambulatory Visit | Attending: Internal Medicine | Admitting: Internal Medicine

## 2021-09-06 DIAGNOSIS — R252 Cramp and spasm: Secondary | ICD-10-CM | POA: Diagnosis not present

## 2021-09-06 DIAGNOSIS — M25562 Pain in left knee: Secondary | ICD-10-CM | POA: Diagnosis not present

## 2021-09-06 DIAGNOSIS — M6281 Muscle weakness (generalized): Secondary | ICD-10-CM

## 2021-09-06 DIAGNOSIS — M545 Low back pain, unspecified: Secondary | ICD-10-CM

## 2021-09-06 DIAGNOSIS — R2689 Other abnormalities of gait and mobility: Secondary | ICD-10-CM | POA: Diagnosis not present

## 2021-09-06 DIAGNOSIS — Z1231 Encounter for screening mammogram for malignant neoplasm of breast: Secondary | ICD-10-CM | POA: Diagnosis not present

## 2021-09-06 NOTE — Therapy (Signed)
[facility] @ [place], [name], [address] Phone: [phone]   Fax:  [phone]  Physical Therapy Treatment  Patient Details  Name: Joan [name] MRN: [id number] Date of Birth: 1956-01-06 Referring Provider (PT): Fenton Foy, Purnell Shoemaker, MD   Encounter Date: 09/06/2021   PT End of Session - 09/06/21 0802     Visit Number 18    Date for PT Re-Evaluation 10/02/21    Authorization Type UMR- medical review after 25    Authorization - Visit Number 18    Authorization - Number of Visits 25    PT Start Time 0730    PT Stop Time 0810    PT Time Calculation (min) 40 min    Activity Tolerance Patient tolerated treatment well    Behavior During Therapy [facility] for tasks assessed/performed             Past Medical History:  Diagnosis Date   Allergy    Anemia    due to Dysmenorrhea   Arthritis    generalized    Hypertension    initially after BCP initiated    Thyroid disease 2005 & 2009   nodule    Past Surgical History:  Procedure Laterality Date   ABDOMINAL HYSTERECTOMY  1998   for heavy menses and fibroids   BIOPSY THYROID  2005,2009   both negative   CESAREAN SECTION     x2   COLONOSCOPY  2009    Dr [facility]   EXPLORATORY LAPAROTOMY  1985   Ovarian cyst   [place]    LLQ pain due to ovarian cyst   SIGMOIDOSCOPY     TONSILLECTOMY      There were no vitals filed for this visit.   Subjective Assessment - 09/06/21 0738     Subjective My back felt better after I exercised here last session.  Overall, it just hurts.  My back is waking me up at night.    Pain Score 7    Pain Location Thoracic    Pain Orientation Right;Mid;Lower    Pain Descriptors / Indicators Aching;Sore;Spasm    Pain Type Chronic pain                               [facility] Adult PT Treatment/Exercise - 09/06/21 0001       Lumbar Exercises: Aerobic   UBE (Upper  Arm Bike) Level 1x 6 min (3/3)-PT present for goal assessment      Lumbar Exercises: Machines for Strengthening   Other Lumbar Machine Exercise Lat pull down: standing 30# 2x10      Lumbar Exercises: Standing   Other Standing Lumbar Exercises Pec stretch in doorway 3x20 seconds      Lumbar Exercises: Seated   Other Seated Lumbar Exercises seated thoracolumbar rotation 3x20 seconds    Other Seated Lumbar Exercises 3 way raises 1# x10 each      Knee/Hip Exercises: Aerobic   Nustep L2 x 8 min for knee warm up and discussion of current status      Knee/Hip Exercises: Standing   Other Standing Knee Exercises weight shifting 3 ways x 1 min      Knee/Hip Exercises: Seated   Sit to Sand without UE support;20 reps   holding 5# KB  PT Short Term Goals - 08/23/21 0804       PT SHORT TERM GOAL #1   Title be independent in initial HEP    Status Achieved      PT SHORT TERM GOAL #2   Title reduce Lt knee and low back pain to stand and walk for 1 hour without increased pain or limitation    Baseline Pt with significant Lt knee pain with standing and walking    Status On-going      PT SHORT TERM GOAL #3   Title demonstrate symmetry on level surfaces due to reduced LBP and Lt knee pain    Baseline intermittent      PT SHORT TERM GOAL #4   Title improve Lt knee strength to ascend steps with step-over-step pattern with use of 1 rail    Baseline still step-to due to Lt knee pain    Status On-going               PT Long Term Goals - 09/04/21 0741       PT LONG TERM GOAL #1   Title be independent in an advanced HEP    Time 4    Period Weeks    Status On-going    Target Date 10/02/21      PT LONG TERM GOAL #2   Title report > or = to 70% reduction in Lt knee and LBP with standing and walking to improve community function    Baseline Lt knee 30% better- feels stronger,  some improvement in Low back a few weeks ago.  Now no change      PT LONG  TERM GOAL #3   Title increased FOTO (knee) to > or = to 61    Baseline 44- Low back (no change)    Time 6    Period Weeks    Status On-going    Target Date 10/02/21      PT LONG TERM GOAL #4   Title demonstrate > or = to 4+/5 to 5/5 Lt hip and knee strength to improve safety and and endurance in the community    Baseline Lt knee 5/5, hip 4/5    Time 4    Period Weeks    Status On-going    Target Date 10/02/21      PT LONG TERM GOAL #5   Title improve Lt knee strength to negotiate steps with step-over-step gait with use of 1 rail    Baseline 40% of the time- step-over-step    Time 4    Period Weeks    Status On-going    Target Date 10/02/21                   Plan - 09/06/21 0743     Clinical Impression Statement Pt reports that low back feels better after she exercises.  She had a good day after last session.  Pain returns to 7/10 on most days and this limits her sleep.   Lt knee is 30% better and feels stronger overall.  Pt is able to negotiate steps with step-over-step gait 40% of the time now.   Session focused on postural strength and flexibility to build associated musculature.  Pt is benefitting from overall strength progression as this will help her chronic symptoms.  Pt required intermittent cueing for core activation and scapular alignment.   Pt will continue to benefit from skilled PT to address lumbar and knee pain.    PT Frequency 2x /  week    PT Duration 4 weeks    PT Treatment/Interventions ADLs/Self Care Home Management;Cryotherapy;Moist Heat;Electrical Stimulation;Gait training;Stair training;Functional mobility training;Therapeutic activities;Therapeutic exercise;Balance training;Neuromuscular re-education;Patient/family education;Manual techniques;Taping;Dry needling;Passive range of motion;Spinal Manipulations;Joint Manipulations    PT Next Visit Plan core strength, scapular strength, comprehensive knee strength, address LBP as needed    PT Home Exercise  Plan Access Code: [email or web address]    Recommended Other Services recert is signed    Consulted and Agree with Plan of Care Patient             Patient will benefit from skilled therapeutic intervention in order to improve the following deficits and impairments:  Abnormal gait, Decreased activity tolerance, Impaired flexibility, Pain, Improper body mechanics, Decreased range of motion, Increased muscle spasms, Difficulty walking, Decreased endurance  Visit Diagnosis: Other abnormalities of gait and mobility  Acute pain of left knee  Acute right-sided low back pain without sciatica  Muscle weakness (generalized)  Cramp and spasm     Problem List Patient Active Problem List   Diagnosis Date Noted   Degenerative arthritis of left knee 08/20/2021   Lumbar radiculopathy 03/28/2020   Osteopenia 08/19/2018   Hyperglycemia 07/09/2018   Hypothyroidism 06/30/2017   Gallbladder polyp 05/20/2016   Fatty liver 05/20/2016   Obesity 05/01/2016   Vitamin D deficiency 07/14/2013   LOW BACK PAIN SYNDROME 01/23/2010   Status post radioactive iodine thyroid ablation 08/17/2009   Hyperlipidemia 07/26/2008   Essential hypertension 07/26/2008   Pain in joint of left shoulder 07/26/2008   Goiter 07/14/2007   Sigurd Sos, PT 09/06/21 8:05 AM  [facility] @ [place], [name], [address] Phone: [phone]   Fax:  [phone]  Name: Brynnlie Unterreiner MRN: [id number] Date of Birth: 11-01-1956

## 2021-09-11 ENCOUNTER — Ambulatory Visit: Payer: 59

## 2021-09-11 ENCOUNTER — Other Ambulatory Visit: Payer: Self-pay

## 2021-09-11 DIAGNOSIS — M6281 Muscle weakness (generalized): Secondary | ICD-10-CM | POA: Diagnosis not present

## 2021-09-11 DIAGNOSIS — R2689 Other abnormalities of gait and mobility: Secondary | ICD-10-CM

## 2021-09-11 DIAGNOSIS — M545 Low back pain, unspecified: Secondary | ICD-10-CM

## 2021-09-11 DIAGNOSIS — R252 Cramp and spasm: Secondary | ICD-10-CM | POA: Diagnosis not present

## 2021-09-11 DIAGNOSIS — M25562 Pain in left knee: Secondary | ICD-10-CM

## 2021-09-11 NOTE — Therapy (Signed)
[facility] @ [place], [name], [address] Phone: [phone]   Fax:  [phone]  Physical Therapy Treatment  Patient Details  Name: Joan [name] MRN: [id number] Date of Birth: [birth year] Referring Provider (PT): Fenton Foy, Purnell Shoemaker, MD   Encounter Date: 09/11/2021   PT End of Session - 09/11/21 0800     Visit Number 19    Date for PT Re-Evaluation 10/02/21    Authorization Type UMR- medical review after 25    Authorization - Visit Number 19    Authorization - Number of Visits 25    PT Start Time 0731    PT Stop Time 0810    PT Time Calculation (min) 39 min    Activity Tolerance Patient tolerated treatment well    Behavior During Therapy [facility] for tasks assessed/performed             Past Medical History:  Diagnosis Date   Allergy    Anemia    due to Dysmenorrhea   Arthritis    generalized    Hypertension    initially after BCP initiated    Thyroid disease 2005 & 2009   nodule    Past Surgical History:  Procedure Laterality Date   ABDOMINAL HYSTERECTOMY  1998   for heavy menses and fibroids   BIOPSY THYROID  2005,2009   both negative   CESAREAN SECTION     x2   COLONOSCOPY  2009    Dr [facility]   EXPLORATORY LAPAROTOMY  1985   Ovarian cyst   [place]    LLQ pain due to ovarian cyst   SIGMOIDOSCOPY     TONSILLECTOMY      There were no vitals filed for this visit.   Subjective Assessment - 09/11/21 0734     Subjective My knee is flared up from all the walking this weekend.  My back is about the same.  I called the MD and there are no sooner appts.    Currently in Pain? Yes    Pain Score 6     Pain Location Knee    Pain Orientation Left    Pain Descriptors / Indicators Aching;Sore    Pain Type Chronic pain    Pain Onset More than a month ago    Pain Frequency Intermittent    Aggravating Factors  movement, walking, standing    Pain  Relieving Factors ice, rest    Pain Location Thoracic    Pain Orientation Right;Mid    Pain Descriptors / Indicators Aching    Pain Type Chronic pain    Pain Onset More than a month ago    Pain Frequency Constant    Aggravating Factors  constant, activity    Pain Relieving Factors ice, rest                               [facility] Adult PT Treatment/Exercise - 09/11/21 0001       Lumbar Exercises: Aerobic   UBE (Upper Arm Bike) Level 1x 6 min (3/3)-PT present for goal assessment      Lumbar Exercises: Machines for Strengthening   Other Lumbar Machine Exercise Lat pull down: standing 30# 2x10      Lumbar Exercises: Standing   Other Standing Lumbar Exercises Pec stretch in doorway 3x20 seconds      Lumbar Exercises: Seated   Other Seated Lumbar Exercises seated  thoracolumbar rotation 3x20 seconds    Other Seated Lumbar Exercises 3 way raises 1# x10 each   seated on green ball     Knee/Hip Exercises: Aerobic   Nustep L2 x 8 min for knee warm up and discussion of current status      Knee/Hip Exercises: Standing   Other Standing Knee Exercises weight shifting 3 ways x 1 min                       PT Short Term Goals - 08/23/21 0804       PT SHORT TERM GOAL #1   Title be independent in initial HEP    Status Achieved      PT SHORT TERM GOAL #2   Title reduce Lt knee and low back pain to stand and walk for 1 hour without increased pain or limitation    Baseline Pt with significant Lt knee pain with standing and walking    Status On-going      PT SHORT TERM GOAL #3   Title demonstrate symmetry on level surfaces due to reduced LBP and Lt knee pain    Baseline intermittent      PT SHORT TERM GOAL #4   Title improve Lt knee strength to ascend steps with step-over-step pattern with use of 1 rail    Baseline still step-to due to Lt knee pain    Status On-going               PT Long Term Goals - 09/04/21 0741       PT LONG TERM GOAL #1    Title be independent in an advanced HEP    Time 4    Period Weeks    Status On-going    Target Date 10/02/21      PT LONG TERM GOAL #2   Title report > or = to 70% reduction in Lt knee and LBP with standing and walking to improve community function    Baseline Lt knee 30% better- feels stronger,  some improvement in Low back a few weeks ago.  Now no change      PT LONG TERM GOAL #3   Title increased FOTO (knee) to > or = to 61    Baseline 44- Low back (no change)    Time 6    Period Weeks    Status On-going    Target Date 10/02/21      PT LONG TERM GOAL #4   Title demonstrate > or = to 4+/5 to 5/5 Lt hip and knee strength to improve safety and and endurance in the community    Baseline Lt knee 5/5, hip 4/5    Time 4    Period Weeks    Status On-going    Target Date 10/02/21      PT LONG TERM GOAL #5   Title improve Lt knee strength to negotiate steps with step-over-step gait with use of 1 rail    Baseline 40% of the time- step-over-step    Time 4    Period Weeks    Status On-going    Target Date 10/02/21                   Plan - 09/11/21 0749     Clinical Impression Statement Pt had a busy weekend with  a lot of walking, therefore increased Lt knee pain.    Lt knee is 30% better and feels stronger overall.  Pt  is able to negotiate steps with step-over-step gait 40% of the time now.   Session focused on postural strength and flexibility to build associated musculature.  Pt is able to perform all activities without limitation or increased pain.  Pt is benefitting from overall strength progression as this will help her chronic symptoms.   Pt will continue to benefit from skilled PT to address lumbar and knee pain.    PT Frequency 2x / week    PT Duration 4 weeks    PT Treatment/Interventions ADLs/Self Care Home Management;Cryotherapy;Moist Heat;Electrical Stimulation;Gait training;Stair training;Functional mobility training;Therapeutic activities;Therapeutic  exercise;Balance training;Neuromuscular re-education;Patient/family education;Manual techniques;Taping;Dry needling;Passive range of motion;Spinal Manipulations;Joint Manipulations    PT Next Visit Plan core strength, scapular strength, comprehensive knee strength, address LBP as needed    PT Home Exercise Plan Access Code: [email or web address]    Consulted and Agree with Plan of Care Patient             Patient will benefit from skilled therapeutic intervention in order to improve the following deficits and impairments:  Abnormal gait, Decreased activity tolerance, Impaired flexibility, Pain, Improper body mechanics, Decreased range of motion, Increased muscle spasms, Difficulty walking, Decreased endurance  Visit Diagnosis: Other abnormalities of gait and mobility  Acute pain of left knee  Acute right-sided low back pain without sciatica  Muscle weakness (generalized)  Cramp and spasm     Problem List Patient Active Problem List   Diagnosis Date Noted   Degenerative arthritis of left knee 08/20/2021   Lumbar radiculopathy 03/28/2020   Osteopenia 08/19/2018   Hyperglycemia 07/09/2018   Hypothyroidism 06/30/2017   Gallbladder polyp 05/20/2016   Fatty liver 05/20/2016   Obesity 05/01/2016   Vitamin D deficiency 07/14/2013   LOW BACK PAIN SYNDROME 01/23/2010   Status post radioactive iodine thyroid ablation 08/17/2009   Hyperlipidemia 07/26/2008   Essential hypertension 07/26/2008   Pain in joint of left shoulder 07/26/2008   Goiter 07/14/2007   Sigurd Sos, PT 09/11/21 8:02 AM   [facility] @ [address] [place], [name], [address] Phone: [phone]   Fax:  [phone]  Name: Joan [name] MRN: [id number] Date of Birth: Jul 29, 1956

## 2021-09-13 ENCOUNTER — Ambulatory Visit: Payer: 59

## 2021-09-13 ENCOUNTER — Other Ambulatory Visit: Payer: Self-pay

## 2021-09-13 DIAGNOSIS — R252 Cramp and spasm: Secondary | ICD-10-CM | POA: Diagnosis not present

## 2021-09-13 DIAGNOSIS — M545 Low back pain, unspecified: Secondary | ICD-10-CM | POA: Diagnosis not present

## 2021-09-13 DIAGNOSIS — M6281 Muscle weakness (generalized): Secondary | ICD-10-CM

## 2021-09-13 DIAGNOSIS — M25562 Pain in left knee: Secondary | ICD-10-CM

## 2021-09-13 DIAGNOSIS — R2689 Other abnormalities of gait and mobility: Secondary | ICD-10-CM | POA: Diagnosis not present

## 2021-09-13 NOTE — Progress Notes (Signed)
Subjective:    Patient ID: Joan [name], female    DOB: 1956-10-14, 65 y.o.   MRN: [id number]  This visit occurred during the SARS-CoV-2 public health emergency.  Safety protocols were in place, including screening questions prior to the visit, additional usage of staff PPE, and extensive cleaning of exam room while observing appropriate contact time as indicated for disinfecting solutions.    HPI The patient is here for an acute visit.  I saw her 8/31 for lower back pain - R side of T9-12.  Xray done and referred to Dr Tamala Julian.  She did more PT and deferred other treatment.  She has not seen much improvement in her back with PT. Dry needling helped minimally.  The muscle relaxer did not help much.  She is using biofreeze, taking 2 aleve bid, which makes the pain tolerable.  .    Pain in right mid back - no radiation.  The pain is worse with certain positions and moving.  She states it does feel musculoskeletal, but she does worry about her kidney.  She had the same type of pain about 2 years ago and saw urology at that time and she had a CT scan.  She does have a small right renal cyst, but this is a simple cyst and nephrology did not think the pain was urological.  She is still doing physical therapy and is helping in some ways, but has not improved his pain.   Medications and allergies reviewed with patient and updated if appropriate.  Patient Active Problem List   Diagnosis Date Noted   Degenerative arthritis of left knee 08/20/2021   Lumbar radiculopathy 03/28/2020   Osteopenia 08/19/2018   Hyperglycemia 07/09/2018   Hypothyroidism 06/30/2017   Gallbladder polyp 05/20/2016   Fatty liver 05/20/2016   Obesity 05/01/2016   Vitamin D deficiency 07/14/2013   LOW BACK PAIN SYNDROME 01/23/2010   Status post radioactive iodine thyroid ablation 08/17/2009   Hyperlipidemia 07/26/2008   Essential hypertension 07/26/2008   Pain in joint of left shoulder 07/26/2008   Goiter  07/14/2007    Current Outpatient Medications on File Prior to Visit  Medication Sig Dispense Refill   amLODipine-benazepril (LOTREL) 5-20 MG capsule Take 1 capsule by mouth daily. Keep scheduled appt for future refills 90 capsule 3   estradiol (VIVELLE-DOT) 0.025 MG/24HR Place 1 patch onto the skin 2 (two) times a week.       estradiol (VIVELLE-DOT) 0.05 MG/24HR patch APPLY 1 PATCH ONTO THE SKIN 2 TIMES WEEKLY 24 patch 4   Loratadine (CLARITIN PO) Take by mouth daily.     naproxen sodium (ALEVE) 220 MG tablet 1 tablet with food or milk as needed     SYNTHROID 100 MCG tablet TAKE 1 TABLET BY MOUTH DAILY 90 tablet 2   triamterene-hydrochlorothiazide (DYAZIDE) 37.5-25 MG capsule Take 1 each (1 capsule total) by mouth daily. 90 capsule 3   Vitamin D, Ergocalciferol, (DRISDOL) 1.25 MG (50000 UNIT) CAPS capsule Take 1 capsule by mouth once a week 12 capsule 4   No current facility-administered medications on file prior to visit.    Past Medical History:  Diagnosis Date   Allergy    Anemia    due to Dysmenorrhea   Arthritis    generalized    Hypertension    initially after BCP initiated    Thyroid disease 2005 & 2009   nodule    Past Surgical History:  Procedure Laterality Date   ABDOMINAL HYSTERECTOMY  1998  for heavy menses and fibroids   BIOPSY THYROID  2005,2009   both negative   CESAREAN SECTION     x2   COLONOSCOPY  2009    Dr [facility]   EXPLORATORY LAPAROTOMY  1985   Ovarian cyst   [place]    LLQ pain due to ovarian cyst   SIGMOIDOSCOPY     TONSILLECTOMY      Social History   Socioeconomic History   Marital status: Married    Spouse name: Not on file   Number of children: Not on file   Years of education: Not on file   Highest education level: Not on file  Occupational History   Not on file  Tobacco Use   Smoking status: Never   Smokeless tobacco: Never  Substance and Sexual Activity   Alcohol use: No   Drug use: No    Sexual activity: Not Currently  Other Topics Concern   Not on file  Social History Narrative   RN at [employer]      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      Social Determinants of Health   Financial Resource Strain: Not on file  Food Insecurity: Not on file  Transportation Needs: Not on file  Physical Activity: Not on file  Stress: Not on file  Social Connections: Not on file    Family History  Problem Relation Age of Onset   Hypertension Father    Kidney failure Father        Bright's Disease; died @ 44   Hypertension Mother    Hyperlipidemia Mother    Osteoarthritis Mother    Hypertension Brother    Diabetes Brother    Stroke Maternal Grandmother        in 35s   Hypertension Maternal Aunt        also DJD/ OA   Breast cancer Maternal Aunt    Heart attack Maternal Grandfather 69       also  CVA   Heart block Paternal Grandmother        pacemaker   Colon cancer Neg Hx    Colon polyps Neg Hx    Esophageal cancer Neg Hx    Rectal cancer Neg Hx    Stomach cancer Neg Hx     Review of Systems  Constitutional:  Negative for fever.  Musculoskeletal:  Positive for back pain (Right mid lateral back-T7-9).  Neurological:  Negative for weakness and numbness.      Objective:   Vitals:   09/14/21 0840  BP: 128/80  Pulse: 83  Temp: 98.3 F (36.8 C)  SpO2: 96%   BP Readings from Last 3 Encounters:  09/14/21 128/80  08/20/21 126/88  07/25/21 116/78   Wt Readings from Last 3 Encounters:  09/14/21 203 lb (92.1 kg)  08/20/21 202 lb (91.6 kg)  07/25/21 202 lb (91.6 kg)   Body mass index is 34.84 kg/m.   Physical Exam Constitutional:      General: She is not in acute distress.    Appearance: Normal appearance. She is not ill-appearing.  HENT:     Head: Normocephalic and atraumatic.  Abdominal:     Tenderness: There is no right CVA tenderness or left CVA tenderness.  Musculoskeletal:        General: Tenderness (Mild tenderness with palpation right mid-lateral back-T  7-9, no thoracic spine tenderness) present. No swelling or deformity.     Right lower leg: No edema.     Left lower leg:  No edema.  Skin:    General: Skin is warm and dry.  Neurological:     Mental Status: She is alert.           Assessment & Plan:    See Problem List for Assessment and Plan of chronic medical problems.

## 2021-09-13 NOTE — Therapy (Signed)
[facility] @ [place], [name], [address] Phone: [phone]   Fax:  [phone]  Physical Therapy Treatment  Patient Details  Name: Joan [name] MRN: [id number] Date of Birth: Apr 06, 1956 Referring Provider (PT): Fenton Foy, Purnell Shoemaker, MD   Encounter Date: 09/13/2021   PT End of Session - 09/13/21 0814     Visit Number 20    Date for PT Re-Evaluation 10/02/21    Authorization Type UMR- medical review after 25    Authorization - Visit Number 20    Authorization - Number of Visits 25    PT Start Time 0732    PT Stop Time 0810    PT Time Calculation (min) 38 min    Activity Tolerance Patient tolerated treatment well    Behavior During Therapy [facility] for tasks assessed/performed             Past Medical History:  Diagnosis Date   Allergy    Anemia    due to Dysmenorrhea   Arthritis    generalized    Hypertension    initially after BCP initiated    Thyroid disease 2005 & 2009   nodule    Past Surgical History:  Procedure Laterality Date   ABDOMINAL HYSTERECTOMY  1998   for heavy menses and fibroids   BIOPSY THYROID  2005,2009   both negative   CESAREAN SECTION     x2   COLONOSCOPY  2009    Dr [facility]   EXPLORATORY LAPAROTOMY  1985   Ovarian cyst   [place]    LLQ pain due to ovarian cyst   SIGMOIDOSCOPY     TONSILLECTOMY      There were no vitals filed for this visit.   Subjective Assessment - 09/13/21 0816     Subjective I called my GP and i'm going tomorrow.  No change in my back pain.    Currently in Pain? Yes    Pain Score 6     Pain Location Knee    Pain Orientation Left    Pain Descriptors / Indicators Aching;Sore                               [facility] Adult PT Treatment/Exercise - 09/13/21 0001       Lumbar Exercises: Aerobic   UBE (Upper Arm Bike) Level 1x 6 min (3/3)-PT present for goal assessment       Lumbar Exercises: Machines for Strengthening   Other Lumbar Machine Exercise Lat pull down: standing 30# 2x10      Lumbar Exercises: Standing   Other Standing Lumbar Exercises Pec stretch in doorway 3x20 seconds      Lumbar Exercises: Seated   Other Seated Lumbar Exercises 3 way raises 1# x10 each   seated on green ball     Knee/Hip Exercises: Seated   Sit to Sand without UE support;20 reps   holding 5# KB                      PT Short Term Goals - 08/23/21 0804       PT SHORT TERM GOAL #1   Title be independent in initial HEP    Status Achieved      PT SHORT TERM GOAL #2   Title reduce Lt knee and low back pain to stand and walk for 1 hour without increased  pain or limitation    Baseline Pt with significant Lt knee pain with standing and walking    Status On-going      PT SHORT TERM GOAL #3   Title demonstrate symmetry on level surfaces due to reduced LBP and Lt knee pain    Baseline intermittent      PT SHORT TERM GOAL #4   Title improve Lt knee strength to ascend steps with step-over-step pattern with use of 1 rail    Baseline still step-to due to Lt knee pain    Status On-going               PT Long Term Goals - 09/04/21 0741       PT LONG TERM GOAL #1   Title be independent in an advanced HEP    Time 4    Period Weeks    Status On-going    Target Date 10/02/21      PT LONG TERM GOAL #2   Title report > or = to 70% reduction in Lt knee and LBP with standing and walking to improve community function    Baseline Lt knee 30% better- feels stronger,  some improvement in Low back a few weeks ago.  Now no change      PT LONG TERM GOAL #3   Title increased FOTO (knee) to > or = to 61    Baseline 44- Low back (no change)    Time 6    Period Weeks    Status On-going    Target Date 10/02/21      PT LONG TERM GOAL #4   Title demonstrate > or = to 4+/5 to 5/5 Lt hip and knee strength to improve safety and and endurance in the community     Baseline Lt knee 5/5, hip 4/5    Time 4    Period Weeks    Status On-going    Target Date 10/02/21      PT LONG TERM GOAL #5   Title improve Lt knee strength to negotiate steps with step-over-step gait with use of 1 rail    Baseline 40% of the time- step-over-step    Time 4    Period Weeks    Status On-going    Target Date 10/02/21                   Plan - 09/13/21 0753     Clinical Impression Statement Pt denies any change this week.  She is going to see her GP tomorrow to discuss continued pain.  Pt has been resting her knee so having less pain.  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   Pt is able to negotiate steps with step-over-step gait more consistently now.  Session focused on postural strength and flexibility to build associated musculature.  Pt is able to perform all activities without limitation or increased pain.  Pt is benefitting from overall strength progression as this will help her chronic symptoms.   Pt will continue to benefit from skilled PT to address lumbar and knee pain.    PT Frequency 2x / week    PT Duration 4 weeks    PT Treatment/Interventions ADLs/Self Care Home Management;Cryotherapy;Moist Heat;Electrical Stimulation;Gait training;Stair training;Functional mobility training;Therapeutic activities;Therapeutic exercise;Balance training;Neuromuscular re-education;Patient/family education;Manual techniques;Taping;Dry needling;Passive range of motion;Spinal Manipulations;Joint Manipulations    PT Next Visit Plan core strength, scapular strength, comprehensive knee strength, address LBP as needed  PT Home Exercise Plan Access Code: [email or web address]    Consulted and Agree with Plan of Care Patient             Patient will benefit from skilled therapeutic intervention in order to improve the following deficits and impairments:     Visit Diagnosis: Other abnormalities of gait and mobility  Acute pain  of left knee  Acute right-sided low back pain without sciatica  Muscle weakness (generalized)     Problem List Patient Active Problem List   Diagnosis Date Noted   Degenerative arthritis of left knee 08/20/2021   Lumbar radiculopathy 03/28/2020   Osteopenia 08/19/2018   Hyperglycemia 07/09/2018   Hypothyroidism 06/30/2017   Gallbladder polyp 05/20/2016   Fatty liver 05/20/2016   Obesity 05/01/2016   Vitamin D deficiency 07/14/2013   LOW BACK PAIN SYNDROME 01/23/2010   Status post radioactive iodine thyroid ablation 08/17/2009   Hyperlipidemia 07/26/2008   Essential hypertension 07/26/2008   Pain in joint of left shoulder 07/26/2008   Goiter 07/14/2007   Sigurd Sos, PT 09/13/21 8:17 AM   [facility] @ [place], [name], [address] Phone: [phone]   Fax:  [phone]  Name: Joan [name] MRN: [id number] Date of Birth: [birth year]

## 2021-09-14 ENCOUNTER — Ambulatory Visit: Payer: 59 | Admitting: Internal Medicine

## 2021-09-14 ENCOUNTER — Encounter: Payer: Self-pay | Admitting: Internal Medicine

## 2021-09-14 ENCOUNTER — Other Ambulatory Visit (HOSPITAL_COMMUNITY): Payer: Self-pay

## 2021-09-14 DIAGNOSIS — M549 Dorsalgia, unspecified: Secondary | ICD-10-CM | POA: Diagnosis not present

## 2021-09-14 MED ORDER — MELOXICAM 15 MG PO TABS
15.0000 mg | ORAL_TABLET | Freq: Every day | ORAL | 2 refills | Status: DC
  Filled 2021-09-14: qty 30, 30d supply, fill #0

## 2021-09-14 NOTE — Patient Instructions (Addendum)
     Medications changes include :   meloxicam 15 mg daily - take with food.    Your prescription(s) have been submitted to your pharmacy. Please take as directed and contact our office if you believe you are having problem(s) with the medication(s).   Consider getting a massage to see if that helps.      Follow up with Dr Tamala Julian as scheduled.

## 2021-09-14 NOTE — Assessment & Plan Note (Signed)
Subacute Likely musculoskeletal in nature She had similar pain 2-1/2 years ago and saw urology and had a CT scan at that time-no neurological cause.  Pain improved, but came back Aleve, Biofreeze does help some.  No relief with PT, dry needling, muscle relaxer Sports medicine believe she has some scapular dyskinesis Will switch to meloxicam 15 mg daily-we will take with food and not take any other NSAIDs while taking this Advised her to try massage therapy Will follow-up with Dr. Tamala Julian as scheduled

## 2021-09-18 ENCOUNTER — Other Ambulatory Visit: Payer: Self-pay

## 2021-09-18 ENCOUNTER — Ambulatory Visit: Payer: 59

## 2021-09-18 DIAGNOSIS — R252 Cramp and spasm: Secondary | ICD-10-CM

## 2021-09-18 DIAGNOSIS — R2689 Other abnormalities of gait and mobility: Secondary | ICD-10-CM | POA: Diagnosis not present

## 2021-09-18 DIAGNOSIS — M6281 Muscle weakness (generalized): Secondary | ICD-10-CM

## 2021-09-18 DIAGNOSIS — M545 Low back pain, unspecified: Secondary | ICD-10-CM

## 2021-09-18 DIAGNOSIS — M25562 Pain in left knee: Secondary | ICD-10-CM

## 2021-09-18 NOTE — Therapy (Signed)
[facility] @ [place], [name], [address] Phone: [phone]   Fax:  [phone]  Physical Therapy Treatment  Patient Details  Name: Joan [name] MRN: [id number] Date of Birth: June 26, 1956 Referring Provider (PT): Fenton Foy, Purnell Shoemaker, MD   Encounter Date: 09/18/2021   PT End of Session - 09/18/21 0802     Visit Number 21    Date for PT Re-Evaluation 10/02/21    Authorization Type UMR- medical review after 25    Authorization - Visit Number 21    Authorization - Number of Visits 25    PT Start Time 0731    PT Stop Time 0810    PT Time Calculation (min) 39 min    Activity Tolerance Patient tolerated treatment well    Behavior During Therapy [facility] for tasks assessed/performed             Past Medical History:  Diagnosis Date   Allergy    Anemia    due to Dysmenorrhea   Arthritis    generalized    Hypertension    initially after BCP initiated    Thyroid disease 2005 & 2009   nodule    Past Surgical History:  Procedure Laterality Date   ABDOMINAL HYSTERECTOMY  1998   for heavy menses and fibroids   BIOPSY THYROID  2005,2009   both negative   CESAREAN SECTION     x2   COLONOSCOPY  2009    Dr [facility]   EXPLORATORY LAPAROTOMY  1985   Ovarian cyst   [place]    LLQ pain due to ovarian cyst   SIGMOIDOSCOPY     TONSILLECTOMY      There were no vitals filed for this visit.   Subjective Assessment - 09/18/21 0738     Subjective I saw my doctor and she prescribed Meloxicam.  She doesn't feel that I need an MRI.  I'll follow-up with Dr Tamala Julian.    Currently in Pain? Yes    Pain Score 6     Pain Location Knee    Pain Orientation Left    Pain Descriptors / Indicators Aching;Sore    Pain Type Chronic pain    Pain Onset More than a month ago    Pain Frequency Intermittent    Aggravating Factors  movement, walking, standing    Pain Relieving Factors  ice, rest    Pain Location Thoracic    Pain Orientation Right;Mid    Pain Descriptors / Indicators Aching    Pain Type Chronic pain    Pain Onset More than a month ago    Pain Frequency Constant    Aggravating Factors  contstant, activitiy    Pain Relieving Factors ice, rest                               [facility] Adult PT Treatment/Exercise - 09/18/21 0001       Lumbar Exercises: Aerobic   UBE (Upper Arm Bike) Level 1x 6 min (3/3)-PT present for goal assessment      Lumbar Exercises: Machines for Strengthening   Other Lumbar Machine Exercise Lat pull down: standing 30# 2x10      Lumbar Exercises: Standing   Other Standing Lumbar Exercises walking in reverse 10# holding handles x20      Lumbar Exercises: Seated   Other Seated Lumbar Exercises 3 way raises 1# x10 each  seated on green ball     Knee/Hip Exercises: Aerobic   Nustep L2 x 8 min- PT monitoring for pain      Knee/Hip Exercises: Standing   Other Standing Knee Exercises weight shifting 3 ways x 1 min      Knee/Hip Exercises: Seated   Sit to Sand without UE support;20 reps   holding 5# KB                      PT Short Term Goals - 08/23/21 0804       PT SHORT TERM GOAL #1   Title be independent in initial HEP    Status Achieved      PT SHORT TERM GOAL #2   Title reduce Lt knee and low back pain to stand and walk for 1 hour without increased pain or limitation    Baseline Pt with significant Lt knee pain with standing and walking    Status On-going      PT SHORT TERM GOAL #3   Title demonstrate symmetry on level surfaces due to reduced LBP and Lt knee pain    Baseline intermittent      PT SHORT TERM GOAL #4   Title improve Lt knee strength to ascend steps with step-over-step pattern with use of 1 rail    Baseline still step-to due to Lt knee pain    Status On-going               PT Long Term Goals - 09/18/21 0740       PT LONG TERM GOAL #2   Title report > or =  to 70% reduction in Lt knee and LBP with standing and walking to improve community function    Baseline Lt knee 30% better- feels stronger,    Status On-going      PT LONG TERM GOAL #4   Title demonstrate > or = to 4+/5 to 5/5 Lt hip and knee strength to improve safety and and endurance in the community    Baseline Lt knee 5/5, hip 4/5    Status On-going      PT LONG TERM GOAL #5   Title improve Lt knee strength to negotiate steps with step-over-step gait with use of 1 rail    Status On-going                   Plan - 09/18/21 0740     Clinical Impression Statement Pt saw her MD last week to discuss continued thoracic pain and MD prescribed meloxicam.  MD feels that this is muscular.  Pt reports that there has not been any change in her back pain.  Pt has failed all manual and modality treatments, so treatment continues to focus on strength and flexibility to improve function.  Pt with continued Lt knee pain.  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   Pt is able to negotiate steps with step-over-step gait more consistently now.  Session focused on postural strength and flexibility to build associated musculature.  Pt is able to perform all activities without limitation or increased pain in the clinic today.  Pt is benefitting from overall strength progression as this will help her chronic symptoms.   Pt will continue to benefit from skilled PT to address lumbar and knee pain.             Patient will benefit from skilled therapeutic  intervention in order to improve the following deficits and impairments:     Visit Diagnosis: Other abnormalities of gait and mobility  Acute pain of left knee  Acute right-sided low back pain without sciatica  Cramp and spasm  Muscle weakness (generalized)     Problem List Patient Active Problem List   Diagnosis Date Noted   Degenerative arthritis of left knee 08/20/2021   Lumbar  radiculopathy 03/28/2020   Mid back pain on right side 09/28/2018   Osteopenia 08/19/2018   Hyperglycemia 07/09/2018   Hypothyroidism 06/30/2017   Gallbladder polyp 05/20/2016   Fatty liver 05/20/2016   Obesity 05/01/2016   Vitamin D deficiency 07/14/2013   LOW BACK PAIN SYNDROME 01/23/2010   Status post radioactive iodine thyroid ablation 08/17/2009   Hyperlipidemia 07/26/2008   Essential hypertension 07/26/2008   Pain in joint of left shoulder 07/26/2008   Goiter 07/14/2007   Sigurd Sos, PT 09/18/21 8:21 AM   [facility] @ [place], [name], [address] Phone: [phone]   Fax:  [phone]  Name: Elisabetta Mishra MRN: [id number] Date of Birth: 12/08/1955

## 2021-09-20 ENCOUNTER — Ambulatory Visit: Payer: 59

## 2021-09-20 ENCOUNTER — Other Ambulatory Visit: Payer: Self-pay

## 2021-09-20 DIAGNOSIS — M545 Low back pain, unspecified: Secondary | ICD-10-CM

## 2021-09-20 DIAGNOSIS — R2689 Other abnormalities of gait and mobility: Secondary | ICD-10-CM | POA: Diagnosis not present

## 2021-09-20 DIAGNOSIS — M25562 Pain in left knee: Secondary | ICD-10-CM

## 2021-09-20 DIAGNOSIS — R252 Cramp and spasm: Secondary | ICD-10-CM

## 2021-09-20 DIAGNOSIS — M6281 Muscle weakness (generalized): Secondary | ICD-10-CM

## 2021-09-20 NOTE — Therapy (Signed)
[facility] @ [place], [name], [address] Phone: [phone]   Fax:  [phone]  Physical Therapy Treatment  Patient Details  Name: Joan [name] MRN: [id number] Date of Birth: Aug 05, 1956 Referring Provider (PT): Fenton Foy, Purnell Shoemaker, MD   Encounter Date: 09/20/2021   PT End of Session - 09/20/21 0758     Visit Number 22    Date for PT Re-Evaluation 10/02/21    Authorization Type UMR- medical review after 25    Authorization - Visit Number 22    Authorization - Number of Visits 25    PT Start Time 0732    PT Stop Time 0807    PT Time Calculation (min) 35 min    Activity Tolerance Patient tolerated treatment well    Behavior During Therapy [facility] for tasks assessed/performed             Past Medical History:  Diagnosis Date   Allergy    Anemia    due to Dysmenorrhea   Arthritis    generalized    Hypertension    initially after BCP initiated    Thyroid disease 2005 & 2009   nodule    Past Surgical History:  Procedure Laterality Date   ABDOMINAL HYSTERECTOMY  1998   for heavy menses and fibroids   BIOPSY THYROID  2005,2009   both negative   CESAREAN SECTION     x2   COLONOSCOPY  2009    Dr [facility]   EXPLORATORY LAPAROTOMY  1985   Ovarian cyst   [place]    LLQ pain due to ovarian cyst   SIGMOIDOSCOPY     TONSILLECTOMY      There were no vitals filed for this visit.   Subjective Assessment - 09/20/21 0741     Subjective I am getting a massage today and that will hopefully help my back pain.                               [facility] Adult PT Treatment/Exercise - 09/20/21 0001       Lumbar Exercises: Aerobic   UBE (Upper Arm Bike) Level 1x 6 min (3/3)-PT present for goal assessment      Lumbar Exercises: Machines for Strengthening   Other Lumbar Machine Exercise Lat pull down: standing 30# 2x10      Lumbar Exercises:  Standing   Other Standing Lumbar Exercises walking in reverse 10# holding handles x20      Lumbar Exercises: Seated   Other Seated Lumbar Exercises 3 way raises 1# x10 each   seated on green ball     Knee/Hip Exercises: Aerobic   Nustep L2 x 8 min- PT monitoring for pain      Knee/Hip Exercises: Standing   Other Standing Knee Exercises weight shifting 3 ways x 1 min      Knee/Hip Exercises: Seated   Sit to Sand without UE support;20 reps   holding 5# KB                      PT Short Term Goals - 08/23/21 0804       PT SHORT TERM GOAL #1   Title be independent in initial HEP    Status Achieved      PT SHORT TERM GOAL #2   Title reduce Lt knee and low back pain to stand and walk  for 1 hour without increased pain or limitation    Baseline Pt with significant Lt knee pain with standing and walking    Status On-going      PT SHORT TERM GOAL #3   Title demonstrate symmetry on level surfaces due to reduced LBP and Lt knee pain    Baseline intermittent      PT SHORT TERM GOAL #4   Title improve Lt knee strength to ascend steps with step-over-step pattern with use of 1 rail    Baseline still step-to due to Lt knee pain    Status On-going               PT Long Term Goals - 09/18/21 0740       PT LONG TERM GOAL #2   Title report > or = to 70% reduction in Lt knee and LBP with standing and walking to improve community function    Baseline Lt knee 30% better- feels stronger,    Status On-going      PT LONG TERM GOAL #4   Title demonstrate > or = to 4+/5 to 5/5 Lt hip and knee strength to improve safety and and endurance in the community    Baseline Lt knee 5/5, hip 4/5    Status On-going      PT LONG TERM GOAL #5   Title improve Lt knee strength to negotiate steps with step-over-step gait with use of 1 rail    Status On-going                   Plan - 09/20/21 0749     Clinical Impression Statement Pt reports that there has not been any change  in her back pain.  Pt is able to participate in all exercise without any limitation in the clinic.  Pt has failed all manual and modality treatments, so treatment continues to focus on strength and flexibility to improve function.  Pt with continued Lt knee pain, although functional strength is improved.  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   Session focused on postural strength and flexibility to build associated musculature.  Pt is able to perform all activities without limitation or increased pain in the clinic today.  Pt is benefitting from overall strength progression as this will help her chronic symptoms.   2 more sessions probable next week with D/C to HEP for continued gains.    PT Frequency 2x / week    PT Duration 4 weeks    PT Treatment/Interventions ADLs/Self Care Home Management;Cryotherapy;Moist Heat;Electrical Stimulation;Gait training;Stair training;Functional mobility training;Therapeutic activities;Therapeutic exercise;Balance training;Neuromuscular re-education;Patient/family education;Manual techniques;Taping;Dry needling;Passive range of motion;Spinal Manipulations;Joint Manipulations    PT Next Visit Plan 2 more visits probable    PT Home Exercise Plan Access Code: [email or web address]    Consulted and Agree with Plan of Care Patient             Patient will benefit from skilled therapeutic intervention in order to improve the following deficits and impairments:  Abnormal gait, Decreased activity tolerance, Impaired flexibility, Pain, Improper body mechanics, Decreased range of motion, Increased muscle spasms, Difficulty walking, Decreased endurance  Visit Diagnosis: Other abnormalities of gait and mobility  Acute pain of left knee  Acute right-sided low back pain without sciatica  Cramp and spasm  Muscle weakness (generalized)     Problem List Patient Active Problem List   Diagnosis Date Noted   Degenerative  arthritis  of left knee 08/20/2021   Lumbar radiculopathy 03/28/2020   Mid back pain on right side 09/28/2018   Osteopenia 08/19/2018   Hyperglycemia 07/09/2018   Hypothyroidism 06/30/2017   Gallbladder polyp 05/20/2016   Fatty liver 05/20/2016   Obesity 05/01/2016   Vitamin D deficiency 07/14/2013   LOW BACK PAIN SYNDROME 01/23/2010   Status post radioactive iodine thyroid ablation 08/17/2009   Hyperlipidemia 07/26/2008   Essential hypertension 07/26/2008   Pain in joint of left shoulder 07/26/2008   Goiter 07/14/2007  Sigurd Sos, PT 09/20/21 7:59 AM   [facility] @ [place], [name], [address] Phone: [phone]   Fax:  [phone]  Name: Joan [name] MRN: [id number] Date of Birth: June 09, 1956

## 2021-09-25 ENCOUNTER — Ambulatory Visit: Payer: 59 | Attending: Orthopedic Surgery

## 2021-09-25 ENCOUNTER — Other Ambulatory Visit: Payer: Self-pay

## 2021-09-25 DIAGNOSIS — M545 Low back pain, unspecified: Secondary | ICD-10-CM | POA: Insufficient documentation

## 2021-09-25 DIAGNOSIS — M25562 Pain in left knee: Secondary | ICD-10-CM | POA: Insufficient documentation

## 2021-09-25 DIAGNOSIS — R2689 Other abnormalities of gait and mobility: Secondary | ICD-10-CM | POA: Diagnosis not present

## 2021-09-25 DIAGNOSIS — M6281 Muscle weakness (generalized): Secondary | ICD-10-CM | POA: Insufficient documentation

## 2021-09-25 DIAGNOSIS — R252 Cramp and spasm: Secondary | ICD-10-CM | POA: Insufficient documentation

## 2021-09-25 NOTE — Therapy (Signed)
[facility] @ [place], [name], [address] Phone: [phone]   Fax:  [phone]  Physical Therapy Treatment  Patient Details  Name: Joan [name] MRN: [id number] Date of Birth: [birth year] Referring Provider (PT): Fenton Foy, Purnell Shoemaker, MD   Encounter Date: 09/25/2021   PT End of Session - 09/25/21 0758     Visit Number 23    Date for PT Re-Evaluation 10/02/21    Authorization Type UMR- medical review after 25    Authorization - Visit Number 23    Authorization - Number of Visits 25    PT Start Time 0731    PT Stop Time 0806    PT Time Calculation (min) 35 min    Activity Tolerance Patient tolerated treatment well    Behavior During Therapy [facility] for tasks assessed/performed             Past Medical History:  Diagnosis Date   Allergy    Anemia    due to Dysmenorrhea   Arthritis    generalized    Hypertension    initially after BCP initiated    Thyroid disease 2005 & 2009   nodule    Past Surgical History:  Procedure Laterality Date   ABDOMINAL HYSTERECTOMY  1998   for heavy menses and fibroids   BIOPSY THYROID  2005,2009   both negative   CESAREAN SECTION     x2   COLONOSCOPY  2009    Dr [facility]   EXPLORATORY LAPAROTOMY  1985   Ovarian cyst   [place]    LLQ pain due to ovarian cyst   SIGMOIDOSCOPY     TONSILLECTOMY      There were no vitals filed for this visit.   Subjective Assessment - 09/25/21 0735     Subjective I tried Voltaren gel on my knee and it helped.    Pertinent History LT knee scope 05/29/21    Currently in Pain? Yes    Pain Score 6     Pain Location Knee    Pain Orientation Left    Pain Descriptors / Indicators Aching;Sore    Pain Type Chronic pain    Pain Onset More than a month ago    Aggravating Factors  movement, walking ,standing    Pain Relieving Factors ice, rest    Pain Score 7    Pain Location Thoracic    Pain  Orientation Right;Mid    Pain Descriptors / Indicators Aching    Pain Type Chronic pain    Pain Onset More than a month ago    Pain Frequency Constant    Aggravating Factors  constant, activity    Pain Relieving Factors ice, rest                               [facility] Adult PT Treatment/Exercise - 09/25/21 0001       Lumbar Exercises: Aerobic   UBE (Upper Arm Bike) Level 1x 6 min (3/3)-PT present for goal assessment      Lumbar Exercises: Machines for Strengthening   Other Lumbar Machine Exercise Lat pull down: standing 30# 2x10      Lumbar Exercises: Standing   Other Standing Lumbar Exercises walking in reverse 10# holding handles x20      Lumbar Exercises: Seated   Other Seated Lumbar Exercises 3 way raises 1# x10 each   seated on green  ball     Knee/Hip Exercises: Aerobic   Nustep L2 x 8 min- PT monitoring for pain      Knee/Hip Exercises: Standing   Other Standing Knee Exercises weight shifting 3 ways x 1 min      Knee/Hip Exercises: Seated   Sit to Sand without UE support;20 reps   holding 5# KB                      PT Short Term Goals - 08/23/21 0804       PT SHORT TERM GOAL #1   Title be independent in initial HEP    Status Achieved      PT SHORT TERM GOAL #2   Title reduce Lt knee and low back pain to stand and walk for 1 hour without increased pain or limitation    Baseline Pt with significant Lt knee pain with standing and walking    Status On-going      PT SHORT TERM GOAL #3   Title demonstrate symmetry on level surfaces due to reduced LBP and Lt knee pain    Baseline intermittent      PT SHORT TERM GOAL #4   Title improve Lt knee strength to ascend steps with step-over-step pattern with use of 1 rail    Baseline still step-to due to Lt knee pain    Status On-going               PT Long Term Goals - 09/18/21 0740       PT LONG TERM GOAL #2   Title report > or = to 70% reduction in Lt knee and LBP with  standing and walking to improve community function    Baseline Lt knee 30% better- feels stronger,    Status On-going      PT LONG TERM GOAL #4   Title demonstrate > or = to 4+/5 to 5/5 Lt hip and knee strength to improve safety and and endurance in the community    Baseline Lt knee 5/5, hip 4/5    Status On-going      PT LONG TERM GOAL #5   Title improve Lt knee strength to negotiate steps with step-over-step gait with use of 1 rail    Status On-going                   Plan - 09/25/21 0745     Clinical Impression Statement Pt reports that there has not been any change in her back pain.  Pt is able to participate in all exercise without any limitation in the clinic.  Pt is now using Voltaren gel on her knee and will try on her back.   Pt with continued Lt knee pain, although functional strength is improved.   Session focused on postural strength and flexibility to build associated musculature.  Pt is able to perform all activities without limitation or increased pain in the clinic today.  Pt is benefitting from overall strength progression as this will help her chronic symptoms.  1 more session probable with D/C to HEP.    PT Frequency 2x / week    PT Duration 4 weeks    PT Treatment/Interventions ADLs/Self Care Home Management;Cryotherapy;Moist Heat;Electrical Stimulation;Gait training;Stair training;Functional mobility training;Therapeutic activities;Therapeutic exercise;Balance training;Neuromuscular re-education;Patient/family education;Manual techniques;Taping;Dry needling;Passive range of motion;Spinal Manipulations;Joint Manipulations    PT Next Visit Plan D/C next session to HEP    PT Home Exercise Plan Access Code: [facility]  Consulted and Agree with Plan of Care Patient             Patient will benefit from skilled therapeutic intervention in order to improve the following deficits and impairments:  Abnormal gait, Decreased activity tolerance, Impaired flexibility,  Pain, Improper body mechanics, Decreased range of motion, Increased muscle spasms, Difficulty walking, Decreased endurance  Visit Diagnosis: Acute pain of left knee  Other abnormalities of gait and mobility  Acute right-sided low back pain without sciatica  Cramp and spasm  Muscle weakness (generalized)     Problem List Patient Active Problem List   Diagnosis Date Noted   Degenerative arthritis of left knee 08/20/2021   Lumbar radiculopathy 03/28/2020   Mid back pain on right side 09/28/2018   Osteopenia 08/19/2018   Hyperglycemia 07/09/2018   Hypothyroidism 06/30/2017   Gallbladder polyp 05/20/2016   Fatty liver 05/20/2016   Obesity 05/01/2016   Vitamin D deficiency 07/14/2013   LOW BACK PAIN SYNDROME 01/23/2010   Status post radioactive iodine thyroid ablation 08/17/2009   Hyperlipidemia 07/26/2008   Essential hypertension 07/26/2008   Pain in joint of left shoulder 07/26/2008   Goiter 07/14/2007   Sigurd Sos, PT 09/25/21 8:02 AM   [facility] @ [place], [name], [address] Phone: [phone]   Fax:  [phone]  Name: Joan [name] MRN: [id number] Date of Birth: [birth year]

## 2021-09-27 ENCOUNTER — Ambulatory Visit: Payer: 59

## 2021-09-27 ENCOUNTER — Other Ambulatory Visit: Payer: Self-pay

## 2021-09-27 DIAGNOSIS — M25562 Pain in left knee: Secondary | ICD-10-CM | POA: Diagnosis not present

## 2021-09-27 DIAGNOSIS — R252 Cramp and spasm: Secondary | ICD-10-CM | POA: Diagnosis not present

## 2021-09-27 DIAGNOSIS — R2689 Other abnormalities of gait and mobility: Secondary | ICD-10-CM

## 2021-09-27 DIAGNOSIS — M545 Low back pain, unspecified: Secondary | ICD-10-CM

## 2021-09-27 DIAGNOSIS — M6281 Muscle weakness (generalized): Secondary | ICD-10-CM | POA: Diagnosis not present

## 2021-09-27 NOTE — Progress Notes (Signed)
[facility] [address] [place] Phone: [phone] Subjective:   IVilma Meckel, am serving as a scribe for Dr. Hulan Saas. This visit occurred during the SARS-CoV-2 public health emergency.  Safety protocols were in place, including screening questions prior to the visit, additional usage of staff PPE, and extensive cleaning of exam room while observing appropriate contact time as indicated for disinfecting solutions.   I'm seeing this patient by the request  of:  [name], MD  CC: left knee and back pain follow up   [id number]  08/20/2021 Degenerative left knee.  Discussed icing regimen and home exercises, discussed which activities to do which wants to avoid.  Increase activity slowly.  Follow-up again with me in 6 weeks where we will have approval for viscosupplementation.  Patient has had steroid injections previously from another facility.  Patient wants to avoid any type of surgical intervention if possible Patient does have low back pain with some mild nerve impingement.  Discussed with patient about the possibility of other injections.  At the moment patient would like to continue with more of a physical therapy.  Patient be referred for the low back as well as some mild scapular dyskinesis noted.  Likely some of it is compensating.  Follow-up again in 4 to 8 weeks  Updated 10/01/2021 Dakia Schifano is a 65 y.o. female coming in with complaint of left knee and LBP. States that there is no significant change in her pain. PT hasn't helped. Has also been doing water aerobics and getting massages. These have not helped either. May want gel injection today for knee.  Xrays IMPRESSION: 1. No acute bony abnormality. 2. Tricompartmental degenerative changes with small joint effusion.       Past Medical History:  Diagnosis Date   Allergy    Anemia    due to Dysmenorrhea   Arthritis    generalized    Hypertension     initially after BCP initiated    Thyroid disease 2005 & 2009   nodule   Past Surgical History:  Procedure Laterality Date   ABDOMINAL HYSTERECTOMY  1998   for heavy menses and fibroids   BIOPSY THYROID  2005,2009   both negative   CESAREAN SECTION     x2   COLONOSCOPY  2009    Dr [facility]   EXPLORATORY LAPAROTOMY  1985   Ovarian cyst   [place]    LLQ pain due to ovarian cyst   SIGMOIDOSCOPY     TONSILLECTOMY     Social History   Socioeconomic History   Marital status: Married    Spouse name: Not on file   Number of children: Not on file   Years of education: Not on file   Highest education level: Not on file  Occupational History   Not on file  Tobacco Use   Smoking status: Never   Smokeless tobacco: Never  Substance and Sexual Activity   Alcohol use: No   Drug use: No   Sexual activity: Not Currently  Other Topics Concern   Not on file  Social History Narrative   RN at [employer]      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      Social Determinants of Health   Financial Resource Strain: Not on file  Food Insecurity: Not on file  Transportation Needs: Not on file  Physical Activity: Not on file  Stress: Not on file  Social Connections: Not  on file   Allergies  Allergen Reactions   Sulfonamide Derivatives     REACTION: RASH Medi Alert bracelet  is recommended     Codeine Nausea Only   Tramadol Nausea And Vomiting   Family History  Problem Relation Age of Onset   Hypertension Father    Kidney failure Father        Bright's Disease; died @ 50   Hypertension Mother    Hyperlipidemia Mother    Osteoarthritis Mother    Hypertension Brother    Diabetes Brother    Stroke Maternal Grandmother        in 66s   Hypertension Maternal Aunt        also DJD/ OA   Breast cancer Maternal Aunt    Heart attack Maternal Grandfather 69       also  CVA   Heart block Paternal Grandmother        pacemaker   Colon cancer Neg Hx    Colon polyps  Neg Hx    Esophageal cancer Neg Hx    Rectal cancer Neg Hx    Stomach cancer Neg Hx     Current Outpatient Medications (Endocrine & Metabolic):    estradiol (VIVELLE-DOT) 0.025 MG/24HR, Place 1 patch onto the skin 2 (two) times a week.     estradiol (VIVELLE-DOT) 0.05 MG/24HR patch, APPLY 1 PATCH ONTO THE SKIN 2 TIMES WEEKLY   SYNTHROID 100 MCG tablet, TAKE 1 TABLET BY MOUTH DAILY  Current Outpatient Medications (Cardiovascular):    amLODipine-benazepril (LOTREL) 5-20 MG capsule, Take 1 capsule by mouth daily. Keep scheduled appt for future refills   triamterene-hydrochlorothiazide (DYAZIDE) 37.5-25 MG capsule, Take 1 each (1 capsule total) by mouth daily.  Current Outpatient Medications (Respiratory):    Loratadine (CLARITIN PO), Take by mouth daily.  Current Outpatient Medications (Analgesics):    meloxicam (MOBIC) 15 MG tablet, Take 1 tablet by mouth daily.   naproxen sodium (ALEVE) 220 MG tablet, 1 tablet with food or milk as needed   Current Outpatient Medications (Other):    Vitamin D, Ergocalciferol, (DRISDOL) 1.25 MG (50000 UNIT) CAPS capsule, Take 1 capsule by mouth once a week   Reviewed prior external information including notes and imaging from  primary care provider As well as notes that were available from care everywhere and other healthcare systems.  Past medical history, social, surgical and family history all reviewed in electronic medical record.  No pertanent information unless stated regarding to the chief complaint.   Review of Systems:  No headache, visual changes, nausea, vomiting, diarrhea, constipation, dizziness, abdominal pain, skin rash, fevers, chills, night sweats, weight loss, swollen lymph nodes, body aches, joint swelling, chest pain, shortness of breath, mood changes. POSITIVE muscle aches  Objective  Blood pressure 130/80, pulse 79, height 5\' 4"  (1.626 m), weight 202 lb (91.6 kg), SpO2 97 %.   General: No apparent distress alert and oriented  x3 mood and affect normal, dressed appropriately.  HEENT: Pupils equal, extraocular movements intact  Respiratory: Patient's speak in full sentences and does not appear short of breath  Cardiovascular: No lower extremity edema, non tender, no erythema  Gait antalgic gait MSK: Patient's left knee does have arthritic changes noted.  Patient does have tenderness to palpation over the medial joint line.  Mild instability with valgus and varus force.  Trace effusion noted.  Low back exam significant tightness noted in the paraspinal musculature of the lumbar spine on the right side.  Positive straight leg test with radicular  symptoms in the L3 distribution.  No significant weakness noted.  Deep tendon reflexes intact  After informed written and verbal consent, patient was seated on exam table. Left knee was prepped with alcohol swab and utilizing anterolateral approach, patient's left knee space was injected with 60 mg per 3 mL of Durolane (sodium hyaluronate) in a prefilled syringe was injected easily into the knee through a 22-gauge needle..Patient tolerated the procedure well without immediate complications.    Impression and Recommendations:     The above documentation has been reviewed and is accurate and complete Lyndal Pulley, DO

## 2021-09-27 NOTE — Therapy (Signed)
[facility] @ [place], [name], [address] Phone: [phone]   Fax:  [phone]  Physical Therapy Treatment  Patient Details  Name: Joan [name] MRN: [id number] Date of Birth: July 16, 1956 Referring Provider (PT): Fenton Foy, Purnell Shoemaker, MD   Encounter Date: 09/27/2021   PT End of Session - 09/27/21 0801     Visit Number 24    Authorization - Visit Number 24    Authorization - Number of Visits 25    PT Start Time 6789    PT Stop Time 0812    PT Time Calculation (min) 37 min    Activity Tolerance Patient tolerated treatment well    Behavior During Therapy [facility] for tasks assessed/performed             Past Medical History:  Diagnosis Date   Allergy    Anemia    due to Dysmenorrhea   Arthritis    generalized    Hypertension    initially after BCP initiated    Thyroid disease 2005 & 2009   nodule    Past Surgical History:  Procedure Laterality Date   ABDOMINAL HYSTERECTOMY  1998   for heavy menses and fibroids   BIOPSY THYROID  2005,2009   both negative   CESAREAN SECTION     x2   COLONOSCOPY  2009    Dr [facility]   EXPLORATORY LAPAROTOMY  1985   Ovarian cyst   [place]    LLQ pain due to ovarian cyst   SIGMOIDOSCOPY     TONSILLECTOMY      There were no vitals filed for this visit.   Subjective Assessment - 09/27/21 0739     Subjective I'll keep doing my exercises.    Patient Stated Goals improve Lt knee strength, improve gait    Currently in Pain? Yes    Pain Score 6     Pain Location Knee    Pain Orientation Left    Pain Descriptors / Indicators Aching;Sore    Pain Type Chronic pain    Pain Onset More than a month ago    Pain Frequency Intermittent    Aggravating Factors  movement, walking, standing    Pain Relieving Factors ice, rest                [facility] PT Assessment - 09/27/21 0001       Assessment   Medical Diagnosis s/p Lt  knee arthroscopy with meniscal debridement, LBP    Referring Provider (PT) Fenton Foy, Purnell Shoemaker, MD    Onset Date/Surgical Date 05/29/21      Prior Function   Level of Independence Independent    Vocation Full time employment      Cognition   Overall Cognitive Status Within Functional Limits for tasks assessed      Strength   Overall Strength Comments Lt knee 5/5, 4/5 hip                           [facility] Adult PT Treatment/Exercise - 09/27/21 0001       Lumbar Exercises: Aerobic   UBE (Upper Arm Bike) Level 1x 6 min (3/3)-PT present for goal assessment      Lumbar Exercises: Machines for Strengthening   Other Lumbar Machine Exercise Lat pull down: standing 30# 2x10      Lumbar Exercises: Standing   Other Standing Lumbar Exercises walking in reverse 10#  holding handles x20      Lumbar Exercises: Seated   Other Seated Lumbar Exercises 3 way raises 1# x10 each   seated on green ball     Knee/Hip Exercises: Aerobic   Nustep L2 x 8 min- PT monitoring for pain      Knee/Hip Exercises: Standing   Other Standing Knee Exercises weight shifting 3 ways x 1 min      Knee/Hip Exercises: Seated   Sit to Sand without UE support;20 reps   holding 5# KB                      PT Short Term Goals - 08/23/21 0804       PT SHORT TERM GOAL #1   Title be independent in initial HEP    Status Achieved      PT SHORT TERM GOAL #2   Title reduce Lt knee and low back pain to stand and walk for 1 hour without increased pain or limitation    Baseline Pt with significant Lt knee pain with standing and walking    Status On-going      PT SHORT TERM GOAL #3   Title demonstrate symmetry on level surfaces due to reduced LBP and Lt knee pain    Baseline intermittent      PT SHORT TERM GOAL #4   Title improve Lt knee strength to ascend steps with step-over-step pattern with use of 1 rail    Baseline still step-to due to Lt knee pain    Status On-going                PT Long Term Goals - 09/27/21 0742       PT LONG TERM GOAL #1   Title be independent in an advanced HEP    Status Achieved      PT LONG TERM GOAL #2   Title report > or = to 70% reduction in Lt knee and LBP with standing and walking to improve community function    Baseline Lt knee 30% better- feels stronger,    Status Achieved      PT LONG TERM GOAL #3   Title increased FOTO (knee) to > or = to 61    Baseline 45- Low back (no change)    Status Not Met      PT LONG TERM GOAL #4   Title demonstrate > or = to 4+/5 to 5/5 Lt hip and knee strength to improve safety and and endurance in the community    Baseline Lt knee 5/5, hip 4/5    Status Partially Met      PT LONG TERM GOAL #5   Title improve Lt knee strength to negotiate steps with step-over-step gait with use of 1 rail    Baseline 40% of the time- step-over-step                   Plan - 09/27/21 0751     Clinical Impression Statement Pt will D/C today to HEP.  She continues to report high levels of thoracolumbar pain and continued knee pain.  Pt reports that she is able to negotiate steps with step-over-step gait 40% of the time.  Pt reports 30% overall improvement in knee pain and no significant change in back pain even with manual therapy and modalities.  Pt reports 6-7/10 constant Rt sided thoracolumbar pain with activity.  Pt is independent and compliant with HEP and will continue with strength and  flexibility and will follow-up with MD tomorrow to discuss continued symptoms.    PT Next Visit Plan D/C PT to HEP today    PT Home Exercise Plan Access Code: [email or web address]    Consulted and Agree with Plan of Care Patient             Patient will benefit from skilled therapeutic intervention in order to improve the following deficits and impairments:     Visit Diagnosis: Acute pain of left knee  Other abnormalities of gait and mobility  Acute right-sided low back pain without sciatica  Muscle  weakness (generalized)     Problem List Patient Active Problem List   Diagnosis Date Noted   Degenerative arthritis of left knee 08/20/2021   Lumbar radiculopathy 03/28/2020   Mid back pain on right side 09/28/2018   Osteopenia 08/19/2018   Hyperglycemia 07/09/2018   Hypothyroidism 06/30/2017   Gallbladder polyp 05/20/2016   Fatty liver 05/20/2016   Obesity 05/01/2016   Vitamin D deficiency 07/14/2013   LOW BACK PAIN SYNDROME 01/23/2010   Status post radioactive iodine thyroid ablation 08/17/2009   Hyperlipidemia 07/26/2008   Essential hypertension 07/26/2008   Pain in joint of left shoulder 07/26/2008   Goiter 07/14/2007   PHYSICAL THERAPY DISCHARGE SUMMARY  Visits from Start of Care: 24  Current functional level related to goals / functional outcomes: See above for current status.     Remaining deficits:  Continued back pain and knee pain.    Education / Equipment: HEP   Patient agrees to discharge. Patient goals were partially met. Patient is being discharged due to being pleased with the current functional level.  Sigurd Sos, PT 09/27/21 8:03 AM   [facility] @ [address] [place], [name], [address] Phone: [phone]   Fax:  [phone]  Name: Joan [name] MRN: [id number] Date of Birth: 04/09/1956

## 2021-10-01 ENCOUNTER — Encounter: Payer: Self-pay | Admitting: Family Medicine

## 2021-10-01 ENCOUNTER — Ambulatory Visit: Payer: 59 | Admitting: Family Medicine

## 2021-10-01 ENCOUNTER — Other Ambulatory Visit: Payer: Self-pay

## 2021-10-01 VITALS — BP 130/80 | HR 79 | Ht 64.0 in | Wt 202.0 lb

## 2021-10-01 DIAGNOSIS — M1712 Unilateral primary osteoarthritis, left knee: Secondary | ICD-10-CM | POA: Diagnosis not present

## 2021-10-01 DIAGNOSIS — M2550 Pain in unspecified joint: Secondary | ICD-10-CM

## 2021-10-01 DIAGNOSIS — M5416 Radiculopathy, lumbar region: Secondary | ICD-10-CM

## 2021-10-01 NOTE — Assessment & Plan Note (Signed)
Chronic problem with exacerbation.  Patient was to do home exercises and icing regimen.  We discussed with patient about topical anti-inflammatories.  Patient hopefully will respond well to this and we can avoid any surgical intervention necessary.  Follow-up with me again 6 weeks.  While doing the viscosupplementation was able to aspirate some straw-colored colored fluid and will send to lab for further evaluation.

## 2021-10-01 NOTE — Patient Instructions (Addendum)
Injection today Epidural Ordered [facility] [phone]  See you again in 6 weeks

## 2021-10-01 NOTE — Assessment & Plan Note (Signed)
Patient signs and symptoms seem to correspond more with the right-sided L3 nerve root that was noted on MRI previously.  Patient has not had any injections but is having worsening pain at this time.  Do feel at this time the patient should have injection for diagnostic and likely therapeutic purposes.  Depending on how patient responds patient could be also a candidate for possible medial branch block but lets see how she does with the nerve root injection first.  Follow-up again in 6 weeks afterwards for this chronic problem with exacerbation.

## 2021-10-02 LAB — SYNOVIAL FLUID ANALYSIS, COMPLETE
Basophils, %: 0 %
Eosinophils-Synovial: 0 % (ref 0–2)
Lymphocytes-Synovial Fld: 76 % — ABNORMAL HIGH (ref 0–74)
Monocyte/Macrophage: 19 % (ref 0–69)
Neutrophil, Synovial: 0 % (ref 0–24)
Synoviocytes, %: 5 % (ref 0–15)
WBC, Synovial: 168 cells/uL — ABNORMAL HIGH (ref <150)

## 2021-10-09 ENCOUNTER — Other Ambulatory Visit: Payer: Self-pay

## 2021-10-09 ENCOUNTER — Other Ambulatory Visit (HOSPITAL_COMMUNITY): Payer: Self-pay

## 2021-10-09 MED ORDER — SYNTHROID 100 MCG PO TABS
ORAL_TABLET | ORAL | 4 refills | Status: DC
  Filled 2021-10-09: qty 90, 90d supply, fill #0
  Filled 2022-01-11: qty 90, 90d supply, fill #1
  Filled 2022-04-08: qty 90, 90d supply, fill #2
  Filled 2022-07-09: qty 90, 90d supply, fill #3
  Filled 2022-10-03: qty 90, 90d supply, fill #4

## 2021-10-10 ENCOUNTER — Other Ambulatory Visit (HOSPITAL_COMMUNITY): Payer: Self-pay

## 2021-10-12 ENCOUNTER — Ambulatory Visit
Admission: RE | Admit: 2021-10-12 | Discharge: 2021-10-12 | Disposition: A | Discharge status: Home / Self Care | Payer: 59 | Source: Ambulatory Visit | Attending: Family Medicine | Admitting: Family Medicine

## 2021-10-12 ENCOUNTER — Other Ambulatory Visit: Payer: 59

## 2021-10-12 ENCOUNTER — Other Ambulatory Visit: Payer: Self-pay

## 2021-10-12 DIAGNOSIS — M5416 Radiculopathy, lumbar region: Secondary | ICD-10-CM

## 2021-10-12 MED ORDER — METHYLPREDNISOLONE ACETATE 40 MG/ML INJ SUSP (RADIOLOG
80.0000 mg | Freq: Once | INTRAMUSCULAR | Status: AC
  Administered 2021-10-12: 80 mg via EPIDURAL

## 2021-10-12 MED ORDER — IOPAMIDOL (ISOVUE-M 200) INJECTION 41%
1.0000 mL | Freq: Once | INTRAMUSCULAR | Status: AC
  Administered 2021-10-12: 1 mL via EPIDURAL

## 2021-10-12 NOTE — Discharge Instructions (Signed)

## 2021-10-25 ENCOUNTER — Other Ambulatory Visit (HOSPITAL_COMMUNITY): Payer: Self-pay

## 2021-10-26 ENCOUNTER — Other Ambulatory Visit (HOSPITAL_COMMUNITY): Payer: Self-pay

## 2021-10-29 ENCOUNTER — Other Ambulatory Visit (HOSPITAL_COMMUNITY): Payer: Self-pay

## 2021-11-09 ENCOUNTER — Ambulatory Visit: Payer: 59 | Admitting: Family Medicine

## 2021-11-12 NOTE — Progress Notes (Signed)
Joan [name] [address] [place] Phone: [phone] Subjective:   [name], am serving as a scribe for Dr. Hulan Saas. This visit occurred during the SARS-CoV-2 public health emergency.  Safety protocols were in place, including screening questions prior to the visit, additional usage of staff PPE, and extensive cleaning of exam room while observing appropriate contact time as indicated for disinfecting solutions.   I'm seeing this patient by the request  of:  [name], MD  CC: Low back pain  [id number]  10/01/2021 Patient signs and symptoms seem to correspond more with the right-sided L3 nerve root that was noted on MRI previously.  Patient has not had any injections but is having worsening pain at this time.  Do feel at this time the patient should have injection for diagnostic and likely therapeutic purposes.  Depending on how patient responds patient could be also a candidate for possible medial branch block but lets see how she does with the nerve root injection first.  Follow-up again in 6 weeks afterwards for this chronic problem with exacerbation.  Chronic problem with exacerbation.  Patient was to do home exercises and icing regimen.  We discussed with patient about topical anti-inflammatories.  Patient hopefully will respond well to this and we can avoid any surgical intervention necessary.  Follow-up with me again 6 weeks.  While doing the viscosupplementation was able to aspirate some straw-colored colored fluid and will send to lab for further evaluation.  Updated 11/13/2021 Joan [name] is a 65 y.o. female coming in with complaint of LBP and left knee pain. Had Epi 10/12/2021. Epidural helped. Less pain that is less frequent. Knee is still bothersome. Would like an injection.  Has known arthritic changes.  Mostly of the medial compartment.  Has had meniscectomy previously       Past Medical History:   Diagnosis Date   Allergy    Anemia    due to Dysmenorrhea   Arthritis    generalized    Hypertension    initially after BCP initiated    Thyroid disease 2005 & 2009   nodule   Past Surgical History:  Procedure Laterality Date   ABDOMINAL HYSTERECTOMY  1998   for heavy menses and fibroids   BIOPSY THYROID  2005,2009   both negative   CESAREAN SECTION     x2   COLONOSCOPY  2009    Dr [facility]   EXPLORATORY LAPAROTOMY  1985   Ovarian cyst   [place]    LLQ pain due to ovarian cyst   SIGMOIDOSCOPY     TONSILLECTOMY     Social History   Socioeconomic History   Marital status: Married    Spouse name: Not on file   Number of children: Not on file   Years of education: Not on file   Highest education level: Not on file  Occupational History   Not on file  Tobacco Use   Smoking status: Never   Smokeless tobacco: Never  Substance and Sexual Activity   Alcohol use: No   Drug use: No   Sexual activity: Not Currently  Other Topics Concern   Not on file  Social History Narrative   RN at [employer]      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      Social Determinants of Health   Financial Resource Strain: Not on file  Food Insecurity: Not on file  Transportation Needs: Not on file  Physical Activity: Not on file  Stress: Not on file  Social Connections: Not on file   Allergies  Allergen Reactions   Sulfonamide Derivatives     REACTION: RASH Medi Alert bracelet  is recommended     Codeine Nausea Only   Tramadol Nausea And Vomiting   Family History  Problem Relation Age of Onset   Hypertension Father    Kidney failure Father        Bright's Disease; died @ 26   Hypertension Mother    Hyperlipidemia Mother    Osteoarthritis Mother    Hypertension Brother    Diabetes Brother    Stroke Maternal Grandmother        in 1s   Hypertension Maternal Aunt        also DJD/ OA   Breast cancer Maternal Aunt    Heart attack Maternal Grandfather  69       also  CVA   Heart block Paternal Grandmother        pacemaker   Colon cancer Neg Hx    Colon polyps Neg Hx    Esophageal cancer Neg Hx    Rectal cancer Neg Hx    Stomach cancer Neg Hx     Current Outpatient Medications (Endocrine & Metabolic):    estradiol (VIVELLE-DOT) 0.025 MG/24HR, Place 1 patch onto the skin 2 (two) times a week.     estradiol (VIVELLE-DOT) 0.05 MG/24HR patch, APPLY 1 PATCH ONTO THE SKIN 2 TIMES WEEKLY   SYNTHROID 100 MCG tablet, TAKE 1 TABLET BY MOUTH DAILY   SYNTHROID 100 MCG tablet, Take 1 tablet by mouth in the morning on an empty stomach  Current Outpatient Medications (Cardiovascular):    amLODipine-benazepril (LOTREL) 5-20 MG capsule, Take 1 capsule by mouth daily. Keep scheduled appt for future refills   triamterene-hydrochlorothiazide (DYAZIDE) 37.5-25 MG capsule, Take 1 each (1 capsule total) by mouth daily.  Current Outpatient Medications (Respiratory):    Loratadine (CLARITIN PO), Take by mouth daily.  Current Outpatient Medications (Analgesics):    meloxicam (MOBIC) 15 MG tablet, Take 1 tablet by mouth daily.   naproxen sodium (ALEVE) 220 MG tablet, 1 tablet with food or milk as needed   Current Outpatient Medications (Other):    Vitamin D, Ergocalciferol, (DRISDOL) 1.25 MG (50000 UNIT) CAPS capsule, Take 1 capsule by mouth once a week   Reviewed prior external information including notes and imaging from  primary care provider As well as notes that were available from care everywhere and other healthcare systems.  Past medical history, social, surgical and family history all reviewed in electronic medical record.  No pertanent information unless stated regarding to the chief complaint.   Review of Systems:  No headache, visual changes, nausea, vomiting, diarrhea, constipation, dizziness, abdominal pain, skin rash, fevers, chills, night sweats, weight loss, swollen lymph nodes, body aches, joint swelling, chest pain, shortness of  breath, mood changes. POSITIVE muscle aches  Objective  Blood pressure 112/76, pulse 91, height 5\' 4"  (1.626 m), weight 203 lb (92.1 kg), SpO2 96 %.   General: No apparent distress alert and oriented x3 mood and affect normal, dressed appropriately.  HEENT: Pupils equal, extraocular movements intact  Respiratory: Patient's speak in full sentences and does not appear short of breath  Cardiovascular: No lower extremity edema, non tender, no erythema  Gait mild antalgic Left knee exam tender to palpation of the medial joint line.  Patient does have some very mild instability noted with valgus and varus force.  After  informed written and verbal consent, patient was seated on exam table. Left knee was prepped with alcohol swab and utilizing anterolateral approach, patient's left knee space was injected with 4:1  marcaine 0.5%: Kenalog 40mg /dL. Patient tolerated the procedure well without immediate complications.    Impression and Recommendations:     The above documentation has been reviewed and is accurate and complete Lyndal Pulley, DO

## 2021-11-13 ENCOUNTER — Ambulatory Visit: Payer: 59 | Admitting: Family Medicine

## 2021-11-13 ENCOUNTER — Other Ambulatory Visit: Payer: Self-pay

## 2021-11-13 DIAGNOSIS — M1712 Unilateral primary osteoarthritis, left knee: Secondary | ICD-10-CM | POA: Diagnosis not present

## 2021-11-13 NOTE — Assessment & Plan Note (Signed)
Patient given injection and tolerated the procedure well, discussed icing regimen and home exercises, discussed which activities to doing which will be tomorrow.  Patient activity as tolerated.  Would be a good candidate for viscosupplementation.  Patient wants to avoid any type of intervention if possible surgically.

## 2021-11-13 NOTE — Patient Instructions (Signed)
Injection today Maybe able to get approval for gel early with insurance change See you again in 2-3 months

## 2021-11-22 ENCOUNTER — Other Ambulatory Visit (HOSPITAL_COMMUNITY): Payer: Self-pay

## 2021-11-29 DIAGNOSIS — H524 Presbyopia: Secondary | ICD-10-CM | POA: Diagnosis not present

## 2021-12-28 ENCOUNTER — Ambulatory Visit (INDEPENDENT_AMBULATORY_CARE_PROVIDER_SITE_OTHER): Payer: 59

## 2021-12-28 ENCOUNTER — Other Ambulatory Visit: Payer: Self-pay

## 2021-12-28 ENCOUNTER — Telehealth: Payer: Self-pay

## 2021-12-28 DIAGNOSIS — Z23 Encounter for immunization: Secondary | ICD-10-CM

## 2021-12-28 NOTE — Progress Notes (Signed)
Pt here for 1st shingles vaccine  given IM, and pt tolerated injection well.  Next injection scheduled for 01/25/22

## 2021-12-28 NOTE — Telephone Encounter (Signed)
Pt came in office today for 1st shingles vaccine. She states that she has been experiencing nose bleeds lately, she states she thinks it could be due to dry air, but wanted to make Dr. Quay Burow aware and possible insight on what could be the cause.

## 2021-12-30 NOTE — Telephone Encounter (Signed)
Use saline nasal spray in each nostril a few times a day  Get a humidifier for your bedroom or house  Use vaseline in the nostril at night to keep the mucus membrane moist    If you continue to have recurrent bleeding we can refer you to an ENT.

## 2021-12-31 NOTE — Telephone Encounter (Signed)
Spoke with patient today. 

## 2022-01-10 NOTE — Progress Notes (Signed)
Joan [name] Joan [name] [address] [place] Phone: [phone] Subjective:   Joan [name], am serving as a scribe for Dr. Hulan Saas. This visit occurred during the SARS-CoV-2 public health emergency.  Safety protocols were in place, including screening questions prior to the visit, additional usage of staff PPE, and extensive cleaning of exam room while observing appropriate contact time as indicated for disinfecting solutions.   I'm seeing this patient by the request  of:  [name], MD  CC: Left knee back pain follow-up  NIO:[id number]  11/13/2021 Patient given injection and tolerated the procedure well, discussed icing regimen and home exercises, discussed which activities to doing which will be tomorrow.  Patient activity as tolerated.  Would be a good candidate for viscosupplementation.  Patient wants to avoid any type of intervention if possible surgically.  Updated 01/15/2022 Joan [name] is a 66 y.o. female coming in with complaint of left knee pain. Helped a lot for about 3 weeks then started to take Aleve again. Knee is hurting again. Epidural helped as well, can still feel it a little. No other complains.       Past Medical History:  Diagnosis Date   Allergy    Anemia    due to Dysmenorrhea   Arthritis    generalized    Hypertension    initially after BCP initiated    Thyroid disease 2005 & 2009   nodule   Past Surgical History:  Procedure Laterality Date   ABDOMINAL HYSTERECTOMY  1998   for heavy menses and fibroids   BIOPSY THYROID  2005,2009   both negative   CESAREAN SECTION     x2   COLONOSCOPY  2009    Dr [facility]   EXPLORATORY LAPAROTOMY  1985   Ovarian cyst   [place]    LLQ pain due to ovarian cyst   SIGMOIDOSCOPY     TONSILLECTOMY     Social History   Socioeconomic History   Marital status: Married    Spouse name: Not on file   Number of children: Not on file    Years of education: Not on file   Highest education level: Not on file  Occupational History   Not on file  Tobacco Use   Smoking status: Never   Smokeless tobacco: Never  Substance and Sexual Activity   Alcohol use: No   Drug use: No   Sexual activity: Not Currently  Other Topics Concern   Not on file  Social History Narrative   RN at [employer]      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      Social Determinants of Health   Financial Resource Strain: Not on file  Food Insecurity: Not on file  Transportation Needs: Not on file  Physical Activity: Not on file  Stress: Not on file  Social Connections: Not on file   Allergies  Allergen Reactions   Sulfonamide Derivatives     REACTION: RASH Medi Alert bracelet  is recommended     Codeine Nausea Only   Tramadol Nausea And Vomiting   Family History  Problem Relation Age of Onset   Hypertension Father    Kidney failure Father        Bright's Disease; died @ 64   Hypertension Mother    Hyperlipidemia Mother    Osteoarthritis Mother    Hypertension Brother    Diabetes Brother    Stroke Maternal Grandmother  in 80s   Hypertension Maternal Aunt        also DJD/ OA   Breast cancer Maternal Aunt    Heart attack Maternal Grandfather 69       also  CVA   Heart block Paternal Grandmother        pacemaker   Colon cancer Neg Hx    Colon polyps Neg Hx    Esophageal cancer Neg Hx    Rectal cancer Neg Hx    Stomach cancer Neg Hx     Current Outpatient Medications (Endocrine & Metabolic):    estradiol (VIVELLE-DOT) 0.025 MG/24HR, Place 1 patch onto the skin 2 (two) times a week.     estradiol (VIVELLE-DOT) 0.05 MG/24HR patch, APPLY 1 PATCH ONTO THE SKIN 2 TIMES WEEKLY   SYNTHROID 100 MCG tablet, TAKE 1 TABLET BY MOUTH DAILY   SYNTHROID 100 MCG tablet, Take 1 tablet by mouth in the morning on an empty stomach  Current Outpatient Medications (Cardiovascular):    amLODipine-benazepril (LOTREL) 5-20 MG capsule, Take 1 capsule  by mouth daily. Keep scheduled appt for future refills   triamterene-hydrochlorothiazide (DYAZIDE) 37.5-25 MG capsule, Take 1 each (1 capsule total) by mouth daily.  Current Outpatient Medications (Respiratory):    Loratadine (CLARITIN PO), Take by mouth daily.  Current Outpatient Medications (Analgesics):    meloxicam (MOBIC) 15 MG tablet, Take 1 tablet by mouth daily.   naproxen sodium (ALEVE) 220 MG tablet, 1 tablet with food or milk as needed   Current Outpatient Medications (Other):    Vitamin D, Ergocalciferol, (DRISDOL) 1.25 MG (50000 UNIT) CAPS capsule, Take 1 capsule by mouth once a week   Objective  Blood pressure 120/90, pulse 98, height 5\' 4"  (1.626 m), weight 203 lb (92.1 kg), SpO2 97 %.   General: No apparent distress alert and oriented x3 mood and affect normal, dressed appropriately.  HEENT: Pupils equal, extraocular movements intact  Respiratory: Patient's speak in full sentences and does not appear short of breath  Cardiovascular: No lower extremity edema, non tender, no erythema  Gait mild antalgic Left knee exam shows arthritic changes noted.  Instability noted with valgus and varus force, trace effusion noted. Low back exam does have some loss of lordosis.  Tightness with straight leg test but nothing severe.     Impression and Recommendations:     The above documentation has been reviewed and is accurate and complete Lyndal Pulley, DO

## 2022-01-11 ENCOUNTER — Other Ambulatory Visit (HOSPITAL_COMMUNITY): Payer: Self-pay

## 2022-01-15 ENCOUNTER — Ambulatory Visit: Payer: 59 | Admitting: Family Medicine

## 2022-01-15 ENCOUNTER — Other Ambulatory Visit: Payer: Self-pay

## 2022-01-15 DIAGNOSIS — M1712 Unilateral primary osteoarthritis, left knee: Secondary | ICD-10-CM | POA: Diagnosis not present

## 2022-01-15 DIAGNOSIS — M5416 Radiculopathy, lumbar region: Secondary | ICD-10-CM | POA: Diagnosis not present

## 2022-01-15 NOTE — Assessment & Plan Note (Signed)
Patient is doing better overall.  Discussed with patient icing regimen and home exercise.  We will repeat the possibility of viscosupplementation but patient is in the process of this retiring and switching to Medicare so do not want a confusion with the insurances at the moment.  Patient does have worsening symptoms this year sooner but otherwise follow-up with me again in 6 weeks and will start likely viscosupplementation and getting a stability OA brace

## 2022-01-15 NOTE — Assessment & Plan Note (Signed)
Significant improvement at this moment after the epidural.  I think the patient will do relatively well.  Discussed icing regimen and home exercise.  Follow-up with me again in 6 to 8 weeks

## 2022-01-15 NOTE — Patient Instructions (Signed)
See you again first week of April

## 2022-01-22 ENCOUNTER — Other Ambulatory Visit (HOSPITAL_COMMUNITY): Payer: Self-pay

## 2022-01-23 ENCOUNTER — Other Ambulatory Visit (HOSPITAL_COMMUNITY): Payer: Self-pay

## 2022-01-25 ENCOUNTER — Ambulatory Visit: Payer: 59

## 2022-02-15 ENCOUNTER — Other Ambulatory Visit (HOSPITAL_COMMUNITY): Payer: Self-pay

## 2022-02-21 NOTE — Progress Notes (Signed)
?Joan [name] D.O. ?[facility] ?[place] ?Phone: [phone] ?Subjective:   ?I, Joan [name], am serving as a scribe for Dr. Hulan Saas. ? ?This visit occurred during the SARS-CoV-2 public health emergency.  Safety protocols were in place, including screening questions prior to the visit, additional usage of staff PPE, and extensive cleaning of exam room while observing appropriate contact time as indicated for disinfecting solutions.  ? ? ?I'm seeing this patient by the request  of:  Joan Rail, MD ? ?CC: Knee pain follow-up ? ?[id number]  ?01/15/2022 ?Significant improvement at this moment after the epidural.  I think the patient will do relatively well.  Discussed icing regimen and home exercise.  Follow-up with me again in 6 to 8 weeks ? ?Patient is doing better overall.  Discussed with patient icing regimen and home exercise.  We will repeat the possibility of viscosupplementation but patient is in the process of this retiring and switching to Medicare so do not want a confusion with the insurances at the moment.  Patient does have worsening symptoms this year sooner but otherwise follow-up with me again in 6 weeks and will start likely viscosupplementation and getting a stability OA brace ? ?Update 02/26/2022 ?Joan [name] is a 66 y.o. female coming in with complaint of L knee and lumbar spine pain. Patient states that she continues to have pain especially when she places all her weight on the L leg. Pain all medial. Using Aleve and Voltaren gel. Notes a general achiness in all joints. Trying to figure out dietary changes to help with inflammation. ? ?Patient also notes pain in L deltoid. Feels that she may have strained her arm doing PT exercises.  ? ?Had nerve root injection in November which she feels helped and continues to provider her with relief.  ? ? ? ? ? ?  ? ?Past Medical History:  ?Diagnosis Date  ? Allergy   ? Anemia   ? due to Dysmenorrhea  ?  Arthritis   ? generalized   ? Hypertension   ? initially after BCP initiated   ? Thyroid disease 2005 & 2009  ? nodule  ? ?Past Surgical History:  ?Procedure Laterality Date  ? ABDOMINAL HYSTERECTOMY  1998  ? for heavy menses and fibroids  ? BIOPSY THYROID  2005,2009  ? both negative  ? CESAREAN SECTION    ? x2  ? COLONOSCOPY  2009  ?  Dr [facility]  ? EXPLORATORY LAPAROTOMY  1985  ? Ovarian cyst  ? [place]  ?  LLQ pain due to ovarian cyst  ? SIGMOIDOSCOPY    ? TONSILLECTOMY    ? ?Social History  ? ?Socioeconomic History  ? Marital status: Married  ?  Spouse name: Not on file  ? Number of children: Not on file  ? Years of education: Not on file  ? Highest education level: Not on file  ?Occupational History  ? Not on file  ?Tobacco Use  ? Smoking status: Never  ? Smokeless tobacco: Never  ?Substance and Sexual Activity  ? Alcohol use: No  ? Drug use: No  ? Sexual activity: Not Currently  ?Other Topics Concern  ? Not on file  ?Social History Narrative  ? RN at [employer]  ?   ? 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  ?   ? ?Social Determinants of Health  ? ?Financial Resource Strain: Not on file  ?Food Insecurity: Not on file  ?Transportation Needs:  Not on file  ?Physical Activity: Not on file  ?Stress: Not on file  ?Social Connections: Not on file  ? ?Allergies  ?Allergen Reactions  ? Sulfonamide Derivatives   ?  REACTION: RASH ?Medi Alert bracelet  is recommended ? ?  ? Codeine Nausea Only  ? Tramadol Nausea And Vomiting  ? ?Family History  ?Problem Relation Age of Onset  ? Hypertension Father   ? Kidney failure Father   ?     Bright's Disease; died @ 61  ? Hypertension Mother   ? Hyperlipidemia Mother   ? Osteoarthritis Mother   ? Hypertension Brother   ? Diabetes Brother   ? Stroke Maternal Grandmother   ?     in 45s  ? Hypertension Maternal Aunt   ?     also DJD/ OA  ? Breast cancer Maternal Aunt   ? Heart attack Maternal Grandfather 69  ?     also  CVA  ? Heart block Paternal Grandmother   ?      pacemaker  ? Colon cancer Neg Hx   ? Colon polyps Neg Hx   ? Esophageal cancer Neg Hx   ? Rectal cancer Neg Hx   ? Stomach cancer Neg Hx   ? ? ?Current Outpatient Medications (Endocrine & Metabolic):  ?  estradiol (VIVELLE-DOT) 0.025 MG/24HR, Place 1 patch onto the skin 2 (two) times a week.   ?  estradiol (VIVELLE-DOT) 0.05 MG/24HR patch, APPLY 1 PATCH ONTO THE SKIN 2 TIMES WEEKLY ?  SYNTHROID 100 MCG tablet, Take 1 tablet by mouth in the morning on an empty stomach ?  SYNTHROID 100 MCG tablet, TAKE 1 TABLET BY MOUTH DAILY ? ?Current Outpatient Medications (Cardiovascular):  ?  amLODipine-benazepril (LOTREL) 5-20 MG capsule, Take 1 capsule by mouth daily. Keep scheduled appt for future refills ?  triamterene-hydrochlorothiazide (DYAZIDE) 37.5-25 MG capsule, Take 1 each (1 capsule total) by mouth daily. ? ?Current Outpatient Medications (Respiratory):  ?  Loratadine (CLARITIN PO), Take by mouth daily. ? ?Current Outpatient Medications (Analgesics):  ?  meloxicam (MOBIC) 15 MG tablet, Take 1 tablet by mouth daily. ?  naproxen sodium (ALEVE) 220 MG tablet, 1 tablet with food or milk as needed ? ? ?Current Outpatient Medications (Other):  ?  Vitamin D, Ergocalciferol, (DRISDOL) 1.25 MG (50000 UNIT) CAPS capsule, Take 1 capsule by mouth once a week ? ? ?Reviewed prior external information including notes and imaging from  ?primary care provider ?As well as notes that were available from care everywhere and other healthcare systems. ? ?Past medical history, social, surgical and family history all reviewed in electronic medical record.  No pertanent information unless stated regarding to the chief complaint.  ? ?Review of Systems: ? No headache, visual changes, nausea, vomiting, diarrhea, constipation, dizziness, abdominal pain, skin rash, fevers, chills, night sweats, weight loss, swollen lymph nodes, body aches, joint swelling, chest pain, shortness of breath, mood changes. POSITIVE muscle aches ? ?Objective  ?Blood  pressure 110/80, pulse (!) 102, height '5\' 4"'$  (1.626 m), weight 202 lb (91.6 kg), SpO2 98 %. ?  ?General: No apparent distress alert and oriented x3 mood and affect normal, dressed appropriately.  ?HEENT: Pupils equal, extraocular movements intact  ?Respiratory: Patient's speak in full sentences and does not appear short of breath  ?Cardiovascular: No lower extremity edema, non tender, no erythema  ?Gait antalgic gait noted ?Left knee does have some instability noted.  The patient does have trace effusion noted.  Lacks the last 20 degrees  of flexion.  Tenderness over the medial joint line. ? ?After informed written and verbal consent, patient was seated on exam table. Left knee was prepped with alcohol swab and utilizing anterolateral approach, patient's left knee space was injected with 40 mg per 3 mL of Monovisc (sodium hyaluronate) in a prefilled syringe was injected easily into the knee through a 22-gauge needle..Patient tolerated the procedure well without immediate complications. ? ?  ?Impression and Recommendations:  ?  ? ?The above documentation has been reviewed and is accurate and complete Lyndal Pulley, DO  ? ? ?

## 2022-02-26 ENCOUNTER — Ambulatory Visit (INDEPENDENT_AMBULATORY_CARE_PROVIDER_SITE_OTHER): Payer: Medicare Other | Admitting: Family Medicine

## 2022-02-26 ENCOUNTER — Encounter: Payer: Self-pay | Admitting: Family Medicine

## 2022-02-26 DIAGNOSIS — M1712 Unilateral primary osteoarthritis, left knee: Secondary | ICD-10-CM

## 2022-02-26 NOTE — Patient Instructions (Addendum)
Injected Monovisc in L knee today ?See me again in 7-8 weeks ? ? ?

## 2022-02-26 NOTE — Assessment & Plan Note (Signed)
Patient given injection and tolerated the procedure well, discussed icing regimen and home exercises.  Chronic problem with exacerbation.  Discussed importance of weight loss and continuing to stay active.  Patient will continue at this point to follow-up with me again in 7 to 8 weeks and consider a repeat steroid injection before she goes on vacation ?

## 2022-03-25 ENCOUNTER — Other Ambulatory Visit (HOSPITAL_COMMUNITY): Payer: Self-pay

## 2022-03-27 ENCOUNTER — Ambulatory Visit (INDEPENDENT_AMBULATORY_CARE_PROVIDER_SITE_OTHER): Payer: Medicare Other

## 2022-03-27 DIAGNOSIS — Z23 Encounter for immunization: Secondary | ICD-10-CM | POA: Diagnosis not present

## 2022-03-27 NOTE — Progress Notes (Signed)
After obtaining consent, and per orders of Dr. Quay Burow, injection of 2nd shingles given by Archie Balboa. Patient tolerated injection well in right deltoid and was instructed to report any adverse reaction to me immediately. ? ?

## 2022-04-05 ENCOUNTER — Ambulatory Visit (INDEPENDENT_AMBULATORY_CARE_PROVIDER_SITE_OTHER): Payer: Medicare Other

## 2022-04-05 VITALS — BP 118/60 | HR 73 | Temp 97.7°F | Ht 64.0 in | Wt 196.8 lb

## 2022-04-05 DIAGNOSIS — Z0000 Encounter for general adult medical examination without abnormal findings: Secondary | ICD-10-CM | POA: Diagnosis not present

## 2022-04-05 NOTE — Progress Notes (Signed)
? ?Subjective:  ? Joan [name] is a 66 y.o. female who presents for Medicare Annual (Subsequent) preventive examination. ? ?Review of Systems    ? ?Cardiac Risk Factors include: advanced age (>10mn, >>68women);dyslipidemia;family history of premature cardiovascular disease;hypertension;obesity (BMI >30kg/m2) ? ?   ?Objective:  ?  ?Today's Vitals  ? 04/05/22 1447  ?BP: 118/60  ?Pulse: 73  ?Temp: 97.7 ?F (36.5 ?C)  ?SpO2: 96%  ?Weight: 196 lb 12.8 oz (89.3 kg)  ?Height: '5\' 4"'$  (1.626 m)  ?PainSc: 0-No pain  ? ?Body mass index is 33.78 kg/m?. ? ? ?  04/05/2022  ?  4:01 PM 07/09/2021  ?  4:09 PM 05/08/2018  ?  1:03 PM 04/24/2018  ?  1:36 PM 08/22/2016  ?  8:48 AM 05/31/2016  ?  7:49 AM 03/17/2014  ?  3:52 PM  ?Advanced Directives  ?Does Patient Have a Medical Advance Directive? Yes Yes Yes Yes Yes Yes Patient would not like information  ?Type of Advance Directive Living will;Healthcare Power of [place] will Living will [place] will   ?Does patient want to make changes to medical advance directive? No - Patient declined No - Patient declined   No - Patient declined No - Patient declined   ?Copy of [place] Chart? No - copy requested No - copy requested       ? ? ?Current Medications (verified) ?Outpatient Encounter Medications as of 04/05/2022  ?Medication Sig  ? amLODipine-benazepril (LOTREL) 5-20 MG capsule Take 1 capsule by mouth daily. Keep scheduled appt for future refills  ? estradiol (VIVELLE-DOT) 0.025 MG/24HR Place 1 patch onto the skin 2 (two) times a week.    ? estradiol (VIVELLE-DOT) 0.05 MG/24HR patch APPLY 1 PATCH ONTO THE SKIN 2 TIMES WEEKLY  ? Loratadine (CLARITIN PO) Take by mouth daily.  ? meloxicam (MOBIC) 15 MG tablet Take 1 tablet by mouth daily.  ? naproxen sodium (ALEVE) 220 MG tablet 1 tablet with food or milk as needed  ? SYNTHROID 100  MCG tablet TAKE 1 TABLET BY MOUTH DAILY  ? SYNTHROID 100 MCG tablet Take 1 tablet by mouth in the morning on an empty stomach  ? triamterene-hydrochlorothiazide (DYAZIDE) 37.5-25 MG capsule Take 1 each (1 capsule total) by mouth daily.  ? Vitamin D, Ergocalciferol, (DRISDOL) 1.25 MG (50000 UNIT) CAPS capsule Take 1 capsule by mouth once a week  ? ?No facility-administered encounter medications on file as of 04/05/2022.  ? ? ?Allergies (verified) ?Sulfonamide derivatives, Codeine, and Tramadol  ? ?History: ?Past Medical History:  ?Diagnosis Date  ? Allergy   ? Anemia   ? due to Dysmenorrhea  ? Arthritis   ? generalized   ? Hypertension   ? initially after BCP initiated   ? Thyroid disease 2005 & 2009  ? nodule  ? ?Past Surgical History:  ?Procedure Laterality Date  ? ABDOMINAL HYSTERECTOMY  1998  ? for heavy menses and fibroids  ? BIOPSY THYROID  2005,2009  ? both negative  ? CESAREAN SECTION    ? x2  ? COLONOSCOPY  2009  ?  [facility]  ? EXPLORATORY LAPAROTOMY  1985  ? Ovarian cyst  ? [place] ?  LLQ pain due to ovarian cyst  ? SIGMOIDOSCOPY    ? TONSILLECTOMY    ? ?Family History  ?Problem Relation Age of Onset  ? Hypertension Father   ?  Kidney failure Father   ?     Bright's Disease; died @ 108  ? Hypertension Mother   ? Hyperlipidemia Mother   ? Osteoarthritis Mother   ? Hypertension Brother   ? Diabetes Brother   ? Stroke Maternal Grandmother   ?     in 64s  ? Hypertension Maternal Aunt   ?     also DJD/ OA  ? Breast cancer Maternal Aunt   ? Heart attack Maternal Grandfather 69  ?     also  CVA  ? Heart block Paternal Grandmother   ?     pacemaker  ? Colon cancer Neg Hx   ? Colon polyps Neg Hx   ? Esophageal cancer Neg Hx   ? Rectal cancer Neg Hx   ? Stomach cancer Neg Hx   ? ?Social History  ? ?Socioeconomic History  ? Marital status: Married  ?  Spouse name: Not on file  ? Number of children: Not on file  ? Years of education: Not on file  ? Highest education level: Not on file   ?Occupational History  ? Not on file  ?Tobacco Use  ? Smoking status: Never  ? Smokeless tobacco: Never  ?Substance and Sexual Activity  ? Alcohol use: No  ? Drug use: No  ? Sexual activity: Not Currently  ?Other Topics Concern  ? Not on file  ?Social History Narrative  ? RN at [employer]  ?   ? 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  ?   ? ?Social Determinants of Health  ? ?Financial Resource Strain: Low Risk   ? Difficulty of Paying Living Expenses: Not hard at all  ?Food Insecurity: No Food Insecurity  ? Worried About Charity fundraiser in the Last Year: Never true  ? Ran Out of Food in the Last Year: Never true  ?Transportation Needs: No Transportation Needs  ? Lack of Transportation (Medical): No  ? Lack of Transportation (Non-Medical): No  ?Physical Activity: Sufficiently Active  ? Days of Exercise per Week: 5 days  ? Minutes of Exercise per Session: 30 min  ?Stress: No Stress Concern Present  ? Feeling of Stress : Not at all  ?Social Connections: Socially Integrated  ? Frequency of Communication with Friends and Family: More than three times a week  ? Frequency of Social Gatherings with Friends and Family: Not on file  ? Attends Religious Services: More than 4 times per year  ? Active Member of Clubs or Organizations: Yes  ? Attends Archivist Meetings: More than 4 times per year  ? Marital Status: Married  ? ? ?Tobacco Counseling ?Counseling given: Not Answered ? ? ?Clinical Intake: ? ?Pre-visit preparation completed: Yes ? ?Pain : No/denies pain ?Pain Score: 0-No pain ? ?  ? ?BMI - recorded: 33.78 ?Nutritional Status: BMI > 30  Obese ?Nutritional Risks: None ?Diabetes: No ? ?How often do you need to have someone help you when you read instructions, pamphlets, or other written materials from your doctor or pharmacy?: 1 - Never ?What is the last grade level you completed in school?: Masters Degree in Nursing; Retired Mudlogger of Nursing with [employer] ? ?Diabetic? no ? ?Interpreter Needed?:  No ? ?Information entered by :: Lisette Abu, LPN. ? ? ?Activities of Daily Living ? ?  04/05/2022  ?  4:03 PM  ?In your present state of health, do you have any difficulty performing the following activities:  ?Hearing? 0  ?Vision? 0  ?Difficulty concentrating or making  decisions? 0  ?Walking or climbing stairs? 0  ?Dressing or bathing? 0  ?Doing errands, shopping? 0  ?Preparing Food and eating ? N  ?Using the Toilet? N  ?In the past six months, have you accidently leaked urine? N  ?Do you have problems with loss of bowel control? N  ?Managing your Medications? N  ?Managing your Finances? N  ?Housekeeping or managing your Housekeeping? N  ? ? ?Patient Care Team: ?[name], MD as PCP - General (Internal Medicine) ?Jola Schmidt, MD as Consulting Physician (Ophthalmology) ? ?Indicate any recent Medical Services you may have received from other than [facility] providers in the past year (date may be approximate). ? ?   ?Assessment:  ? This is a routine wellness examination for Bevely Palmer. ? ?Hearing/Vision screen ?Hearing Screening - Comments:: Patient has decreased hearing. ?No hearing aids. ?Vision Screening - Comments:: Patient wears corrective eyewear. ?Eye exam by: Jola Schmidt, MD. ? ?Dietary issues and exercise activities discussed: ?Current Exercise Habits: Structured exercise class;Home exercise routine, Type of exercise: walking;Other - see comments (bike, stepper, phys therapy exercises), Time (Minutes): 30, Frequency (Times/Week): 5, Weekly Exercise (Minutes/Week): 150, Intensity: Mild, Exercise limited by: orthopedic condition(s) ? ? Goals Addressed   ? ?  ?  ?  ?  ? This Visit's Progress  ?  My goal is to lose 20 more pounds.     ? ?  ?Depression Screen ? ?  04/05/2022  ?  4:03 PM 09/14/2021  ?  8:40 AM 07/25/2021  ?  1:40 PM 03/28/2020  ?  1:49 PM 07/14/2013  ?  2:53 PM  ?PHQ 2/9 Scores  ?PHQ - 2 Score 0 0 0 0 0  ?PHQ- 9 Score   0    ?  ?Fall Risk ? ?  04/05/2022  ?  4:03 PM 09/14/2021  ?  8:40 AM  03/28/2020  ?  1:49 PM 03/17/2014  ?  3:53 PM 07/14/2013  ?  2:53 PM  ?Fall Risk   ?Falls in the past year? 0 0 1 No No  ?Number falls in past yr: 0 0 0    ?Injury with Fall? 0 0 1    ?Risk for fall due to : No Fall Risks

## 2022-04-05 NOTE — Patient Instructions (Signed)
Joan [name] , ?Thank you for taking time to come for your Medicare Wellness Visit. I appreciate your ongoing commitment to your health goals. Please review the following plan we discussed and let me know if I can assist you in the future.  ? ?Screening recommendations/referrals: ?Colonoscopy: 05/08/2018; due every 5 years ?Mammogram: 09/06/2021; due every year ?Bone Density: 09/16/2018; due every 3 years ?Recommended yearly ophthalmology/optometry visit for glaucoma screening and checkup ?Recommended yearly dental visit for hygiene and checkup ? ?Vaccinations: ?Influenza vaccine: 08/2021 ?Pneumococcal vaccine: 07/15/2019, 07/19/2020 ?Tdap vaccine: 06/30/2015; due every 10 years ?Shingles vaccine: 12/28/2021, 03/27/2022   ?Covid-19: 11/15/2019, 12/06/2019, 04/06/2021, 08/21/2021 ? ?Advanced directives: Yes; Please bring a copy of your health care power of attorney and living will to the office at your convenience. ? ?Conditions/risks identified: Yes ? ?Next appointment: Please schedule your next Medicare Wellness Visit with your Nurse Health Advisor in 1 year by calling [phone]. ? ? ?Preventive Care 68 Years and Older, Female ?Preventive care refers to lifestyle choices and visits with your health care provider that can promote health and wellness. ?What does preventive care include? ?A yearly physical exam. This is also called an annual well check. ?Dental exams once or twice a year. ?Routine eye exams. Ask your health care provider how often you should have your eyes checked. ?Personal lifestyle choices, including: ?Daily care of your teeth and gums. ?Regular physical activity. ?Eating a healthy diet. ?Avoiding tobacco and drug use. ?Limiting alcohol use. ?Practicing safe sex. ?Taking low-dose aspirin every day. ?Taking vitamin and mineral supplements as recommended by your health care provider. ?What happens during an annual well check? ?The services and screenings done by your health care provider during your annual well  check will depend on your age, overall health, lifestyle risk factors, and family history of disease. ?Counseling  ?Your health care provider may ask you questions about your: ?Alcohol use. ?Tobacco use. ?Drug use. ?Emotional well-being. ?Home and relationship well-being. ?Sexual activity. ?Eating habits. ?History of falls. ?Memory and ability to understand (cognition). ?Work and work Statistician. ?Reproductive health. ?Screening  ?You may have the following tests or measurements: ?Height, weight, and BMI. ?Blood pressure. ?Lipid and cholesterol levels. These may be checked every 5 years, or more frequently if you are over 30 years old. ?Skin check. ?Lung cancer screening. You may have this screening every year starting at age 72 if you have a 30-pack-year history of smoking and currently smoke or have quit within the past 15 years. ?Fecal occult blood test (FOBT) of the stool. You may have this test every year starting at age 49. ?Flexible sigmoidoscopy or colonoscopy. You may have a sigmoidoscopy every 5 years or a colonoscopy every 10 years starting at age 69. ?Hepatitis C blood test. ?Hepatitis B blood test. ?Sexually transmitted disease (STD) testing. ?Diabetes screening. This is done by checking your blood sugar (glucose) after you have not eaten for a while (fasting). You may have this done every 1-3 years. ?Bone density scan. This is done to screen for osteoporosis. You may have this done starting at age 11. ?Mammogram. This may be done every 1-2 years. Talk to your health care provider about how often you should have regular mammograms. ?Talk with your health care provider about your test results, treatment options, and if necessary, the need for more tests. ?Vaccines  ?Your health care provider may recommend certain vaccines, such as: ?Influenza vaccine. This is recommended every year. ?Tetanus, diphtheria, and acellular pertussis (Tdap, Td) vaccine. You may need a Td  booster every 10 years. ?Zoster  vaccine. You may need this after age 68.?Pneumococcal 13-valent conjugate (PCV13) vaccine. One dose is recommended after age 26. ?Pneumococcal polysaccharide (PPSV23) vaccine. One dose is recommended after age 51. ?Talk to your health care provider about which screenings and vaccines you need and how often you need them. ?This information is not intended to replace advice given to you by your health care provider. Make sure you discuss any questions you have with your health care provider. ?Document Released: 12/08/2015 Document Revised: 07/31/2016 Document Reviewed: 09/12/2015 ?Elsevier Interactive Patient Education ? 2017 [place]. ? ?Fall Prevention in the Home ?Falls can cause injuries. They can happen to people of all ages. There are many things you can do to make your home safe and to help prevent falls. ?What can I do on the outside of my home? ?Regularly fix the edges of walkways and driveways and fix any cracks. ?Remove anything that might make you trip as you walk through a door, such as a raised step or threshold. ?Trim any bushes or trees on the path to your home. ?Use bright outdoor lighting. ?Clear any walking paths of anything that might make someone trip, such as rocks or tools. ?Regularly check to see if handrails are loose or broken. Make sure that both sides of any steps have handrails. ?Any raised decks and porches should have guardrails on the edges. ?Have any leaves, snow, or ice cleared regularly. ?Use sand or salt on walking paths during winter. ?Clean up any spills in your garage right away. This includes oil or grease spills. ?What can I do in the bathroom? ?Use night lights. ?Install grab bars by the toilet and in the tub and shower. Do not use towel bars as grab bars. ?Use non-skid mats or decals in the tub or shower. ?If you need to sit down in the shower, use a plastic, non-slip stool. ?Keep the floor dry. Clean up any water that spills on the floor as soon as it happens. ?Remove  soap buildup in the tub or shower regularly. ?Attach bath mats securely with double-sided non-slip rug tape. ?Do not have throw rugs and other things on the floor that can make you trip. ?What can I do in the bedroom? ?Use night lights. ?Make sure that you have a light by your bed that is easy to reach. ?Do not use any sheets or blankets that are too big for your bed. They should not hang down onto the floor. ?Have a firm chair that has side arms. You can use this for support while you get dressed. ?Do not have throw rugs and other things on the floor that can make you trip. ?What can I do in the kitchen? ?Clean up any spills right away. ?Avoid walking on wet floors. ?Keep items that you use a lot in easy-to-reach places. ?If you need to reach something above you, use a strong step stool that has a grab bar. ?Keep electrical cords out of the way. ?Do not use floor polish or wax that makes floors slippery. If you must use wax, use non-skid floor wax. ?Do not have throw rugs and other things on the floor that can make you trip. ?What can I do with my stairs? ?Do not leave any items on the stairs. ?Make sure that there are handrails on both sides of the stairs and use them. Fix handrails that are broken or loose. Make sure that handrails are as long as the stairways. ?Check any carpeting  to make sure that it is firmly attached to the stairs. Fix any carpet that is loose or worn. ?Avoid having throw rugs at the top or bottom of the stairs. If you do have throw rugs, attach them to the floor with carpet tape. ?Make sure that you have a light switch at the top of the stairs and the bottom of the stairs. If you do not have them, ask someone to add them for you. ?What else can I do to help prevent falls? ?Wear shoes that: ?Do not have high heels. ?Have rubber bottoms. ?Are comfortable and fit you well. ?Are closed at the toe. Do not wear sandals. ?If you use a stepladder: ?Make sure that it is fully opened. Do not climb a  closed stepladder. ?Make sure that both sides of the stepladder are locked into place. ?Ask someone to hold it for you, if possible. ?Clearly mark and make sure that you can see: ?Any grab bars or handrails. ?

## 2022-04-07 ENCOUNTER — Encounter: Payer: Self-pay | Admitting: Internal Medicine

## 2022-04-07 NOTE — Patient Instructions (Addendum)
? ? ? ?  Blood work was ordered.   ? ? ? ?Medications changes include :   none ? ? ? ? ?Return in about 1 year (around 04/09/2023) for follow up, Schedule DEXA-[place]. ? ?

## 2022-04-07 NOTE — Progress Notes (Signed)
? ? ? ? ?Subjective:  ? ? Patient ID: Joan [name], female    DOB: 1956/02/14, 66 y.o.   MRN: [id number] ? ? ? ? ?HPI ?Bevely Palmer is here for follow up of her chronic medical problems, including htn, hld , hypothyroidism ? ? ?Taking fish oil, tart cherry, B vitamins and osteo-biflex for her arthritis.  Uses voltaren gel and heat.   ? ?She is doing some exercise - going to gym.  She is working on weight loss.  She knows she needs to lose weight, especially for her joints.  She is starting to have more arthritis. ? ?Her husband has told her she snores.  Her sleep is refreshed.  She denies ever feeling tired during the day. ? ?Medications and allergies reviewed with patient and updated if appropriate. ? ?Current Outpatient Medications on File Prior to Visit  ?Medication Sig Dispense Refill  ? amLODipine-benazepril (LOTREL) 5-20 MG capsule Take 1 capsule by mouth daily. Keep scheduled appt for future refills 90 capsule 3  ? estradiol (VIVELLE-DOT) 0.025 MG/24HR Place 1 patch onto the skin 2 (two) times a week.      ? Glucosamine-Chondroit-Vit C-Mn (GLUCOSAMINE 1500 COMPLEX PO) Take 1,500 mg by mouth 2 (two) times daily. Glucosamine 1500 mg bid; Chondrotin 1100 mg bid    ? Loratadine (CLARITIN PO) Take by mouth daily.    ? naproxen sodium (ALEVE) 220 MG tablet 1 tablet with food or milk as needed    ? Omega-3 Fatty Acids (FISH OIL OMEGA-3 PO) Take by mouth 2 (two) times daily.    ? scopolamine (TRANSDERM-SCOP) 1 MG/3DAYS scopolamine 1 mg over 3 days transdermal patch    ? SYNTHROID 100 MCG tablet Take 1 tablet by mouth in the morning on an empty stomach 90 tablet 4  ? TART CHERRY PO Take 1,200 mg by mouth daily.    ? triamterene-hydrochlorothiazide (DYAZIDE) 37.5-25 MG capsule Take 1 each (1 capsule total) by mouth daily. 90 capsule 3  ? Vitamin D, Ergocalciferol, (DRISDOL) 1.25 MG (50000 UNIT) CAPS capsule Take 1 capsule by mouth once a week 12 capsule 4  ? ?No current facility-administered medications on file prior to  visit.  ? ? ? ?Review of Systems  ?Constitutional:  Negative for fever.  ?Respiratory:  Negative for cough, shortness of breath and wheezing.   ?Cardiovascular:  Negative for chest pain, palpitations and leg swelling.  ?Neurological:  Negative for dizziness, light-headedness and headaches.  ? ?   ?Objective:  ? ?Vitals:  ? 04/08/22 0932  ?BP: 108/80  ?Pulse: 73  ?Temp: 98.5 ?F (36.9 ?C)  ?SpO2: 97%  ? ?BP Readings from Last 3 Encounters:  ?04/08/22 108/80  ?04/05/22 118/60  ?02/26/22 110/80  ? ?Wt Readings from Last 3 Encounters:  ?04/08/22 198 lb (89.8 kg)  ?04/05/22 196 lb 12.8 oz (89.3 kg)  ?02/26/22 202 lb (91.6 kg)  ? ?Body mass index is 33.99 kg/m?. ? ?  ?Physical Exam ?Constitutional:   ?   General: She is not in acute distress. ?   Appearance: Normal appearance.  ?HENT:  ?   Head: Normocephalic and atraumatic.  ?Eyes:  ?   Conjunctiva/sclera: Conjunctivae normal.  ?Cardiovascular:  ?   Rate and Rhythm: Normal rate and regular rhythm.  ?   Heart sounds: Normal heart sounds. No murmur heard. ?Pulmonary:  ?   Effort: Pulmonary effort is normal. No respiratory distress.  ?   Breath sounds: Normal breath sounds. No wheezing.  ?Musculoskeletal:  ?   Cervical  back: Neck supple.  ?   Right lower leg: No edema.  ?   Left lower leg: No edema.  ?Lymphadenopathy:  ?   Cervical: No cervical adenopathy.  ?Skin: ?   General: Skin is warm and dry.  ?   Findings: No rash.  ?Neurological:  ?   Mental Status: She is alert. Mental status is at baseline.  ?Psychiatric:     ?   Mood and Affect: Mood normal.     ?   Behavior: Behavior normal.  ? ?   ? ?Lab Results  ?Component Value Date  ? WBC 9.7 07/25/2021  ? HGB 14.3 07/25/2021  ? HCT 42.3 07/25/2021  ? PLT 259.0 07/25/2021  ? GLUCOSE 86 07/25/2021  ? CHOL 252 (H) 07/25/2021  ? TRIG 175.0 (H) 07/25/2021  ? HDL 53.40 07/25/2021  ? LDLDIRECT 183.0 07/27/2008  ? LDLCALC 164 (H) 07/25/2021  ? ALT 28 07/25/2021  ? AST 25 07/25/2021  ? NA 135 07/25/2021  ? K 3.4 (L) 07/25/2021  ? CL  97 07/25/2021  ? CREATININE 0.73 07/25/2021  ? BUN 14 07/25/2021  ? CO2 30 07/25/2021  ? TSH 1.99 07/25/2021  ? HGBA1C 5.4 07/25/2021  ? ? ? ?Assessment & Plan:  ? ? ?See Problem List for Assessment and Plan of chronic medical problems.  ? ? ?

## 2022-04-08 ENCOUNTER — Ambulatory Visit (INDEPENDENT_AMBULATORY_CARE_PROVIDER_SITE_OTHER): Payer: Medicare Other | Admitting: Internal Medicine

## 2022-04-08 ENCOUNTER — Other Ambulatory Visit (HOSPITAL_COMMUNITY): Payer: Self-pay

## 2022-04-08 VITALS — BP 108/80 | HR 73 | Temp 98.5°F | Ht 64.0 in | Wt 198.0 lb

## 2022-04-08 DIAGNOSIS — E890 Postprocedural hypothyroidism: Secondary | ICD-10-CM | POA: Diagnosis not present

## 2022-04-08 DIAGNOSIS — E782 Mixed hyperlipidemia: Secondary | ICD-10-CM

## 2022-04-08 DIAGNOSIS — I10 Essential (primary) hypertension: Secondary | ICD-10-CM | POA: Diagnosis not present

## 2022-04-08 DIAGNOSIS — R739 Hyperglycemia, unspecified: Secondary | ICD-10-CM | POA: Diagnosis not present

## 2022-04-08 DIAGNOSIS — M85852 Other specified disorders of bone density and structure, left thigh: Secondary | ICD-10-CM | POA: Diagnosis not present

## 2022-04-08 LAB — CBC WITH DIFFERENTIAL/PLATELET
Basophils Absolute: 0.1 10*3/uL (ref 0.0–0.1)
Basophils Relative: 0.9 % (ref 0.0–3.0)
Eosinophils Absolute: 0.1 10*3/uL (ref 0.0–0.7)
Eosinophils Relative: 1.5 % (ref 0.0–5.0)
HCT: 40.6 % (ref 36.0–46.0)
Hemoglobin: 14.1 g/dL (ref 12.0–15.0)
Lymphocytes Relative: 25.2 % (ref 12.0–46.0)
Lymphs Abs: 1.7 10*3/uL (ref 0.7–4.0)
MCHC: 34.6 g/dL (ref 30.0–36.0)
MCV: 92.2 fl (ref 78.0–100.0)
Monocytes Absolute: 0.4 10*3/uL (ref 0.1–1.0)
Monocytes Relative: 6.5 % (ref 3.0–12.0)
Neutro Abs: 4.5 10*3/uL (ref 1.4–7.7)
Neutrophils Relative %: 65.9 % (ref 43.0–77.0)
Platelets: 219 10*3/uL (ref 150.0–400.0)
RBC: 4.41 Mil/uL (ref 3.87–5.11)
RDW: 14 % (ref 11.5–15.5)
WBC: 6.8 10*3/uL (ref 4.0–10.5)

## 2022-04-08 NOTE — Assessment & Plan Note (Signed)
Chronic ?DEXA due-ordered ?Taking vitamin D regularly ?Taking calcium ?She is exercising ?

## 2022-04-08 NOTE — Assessment & Plan Note (Signed)
Sugars been in the normal range ?CMP ?

## 2022-04-08 NOTE — Assessment & Plan Note (Addendum)
Chronic ?Regular exercise and healthy diet encouraged ?Check lipid panel  ?May need to consider starting a statin-we will see what cholesterol looks like ?

## 2022-04-08 NOTE — Assessment & Plan Note (Signed)
Chronic ?Managed by Dr. Michiel Sites ?On Synthroid 100 mcg daily ?

## 2022-04-08 NOTE — Assessment & Plan Note (Addendum)
Chronic ?Blood pressure well controlled-May even be on the low side.  Discussed that as she loses weight she will need to monitor her BP at home and if it continues to be low or she experiences any lightheadedness we may need to decrease her medications ?CMP ?Continue amlodipine-benazepril 5-20 mg daily, triamterene-HCTZ 37.5-25 mg daily ?

## 2022-04-09 LAB — LIPID PANEL
Cholesterol: 252 mg/dL — ABNORMAL HIGH (ref 0–200)
HDL: 53.6 mg/dL (ref >39.00)
LDL Cholesterol: 174 mg/dL — ABNORMAL HIGH (ref 0–99)
NonHDL: 197.91
Total CHOL/HDL Ratio: 5
Triglycerides: 121 mg/dL (ref 0.0–149.0)
VLDL: 24.2 mg/dL (ref 0.0–40.0)

## 2022-04-09 LAB — COMPREHENSIVE METABOLIC PANEL
ALT: 22 U/L (ref 0–35)
AST: 21 U/L (ref 0–37)
Albumin: 4.5 g/dL (ref 3.5–5.2)
Alkaline Phosphatase: 67 U/L (ref 39–117)
BUN: 15 mg/dL (ref 6–23)
CO2: 28 mEq/L (ref 19–32)
Calcium: 9.4 mg/dL (ref 8.4–10.5)
Chloride: 101 mEq/L (ref 96–112)
Creatinine, Ser: 0.78 mg/dL (ref 0.40–1.20)
GFR: 78.14 mL/min (ref >60.00)
Glucose, Bld: 100 mg/dL — ABNORMAL HIGH (ref 70–99)
Potassium: 3.9 mEq/L (ref 3.5–5.1)
Sodium: 138 mEq/L (ref 135–145)
Total Bilirubin: 0.4 mg/dL (ref 0.2–1.2)
Total Protein: 6.8 g/dL (ref 6.0–8.3)

## 2022-04-11 ENCOUNTER — Other Ambulatory Visit (HOSPITAL_COMMUNITY): Payer: Self-pay

## 2022-04-11 ENCOUNTER — Other Ambulatory Visit: Payer: Self-pay | Admitting: Internal Medicine

## 2022-04-11 ENCOUNTER — Ambulatory Visit (INDEPENDENT_AMBULATORY_CARE_PROVIDER_SITE_OTHER)
Admission: RE | Admit: 2022-04-11 | Discharge: 2022-04-11 | Disposition: A | Discharge status: Home / Self Care | Payer: Medicare Other | Source: Ambulatory Visit | Attending: Internal Medicine | Admitting: Internal Medicine

## 2022-04-11 DIAGNOSIS — M85852 Other specified disorders of bone density and structure, left thigh: Secondary | ICD-10-CM

## 2022-04-11 MED ORDER — SCOPOLAMINE 1 MG/3DAYS TD PT72
1.0000 | MEDICATED_PATCH | TRANSDERMAL | 0 refills | Status: DC
  Filled 2022-04-11: qty 4, 12d supply, fill #0

## 2022-04-12 ENCOUNTER — Other Ambulatory Visit (HOSPITAL_COMMUNITY): Payer: Self-pay

## 2022-04-15 ENCOUNTER — Other Ambulatory Visit (HOSPITAL_COMMUNITY): Payer: Self-pay

## 2022-04-23 ENCOUNTER — Other Ambulatory Visit (HOSPITAL_COMMUNITY): Payer: Self-pay

## 2022-05-02 NOTE — Progress Notes (Unsigned)
[place] Phone: [phone] Subjective:   Fontaine No, am serving as a scribe for Dr. Hulan Saas.   I'm seeing this patient by the request  of:  [name], MD  CC: left knee pain follow up   [id number]  02/26/2022 Patient given injection and tolerated the procedure well, discussed icing regimen and home exercises.  Chronic problem with exacerbation.  Discussed importance of weight loss and continuing to stay active.  Patient will continue at this point to follow-up with me again in 7 to 8 weeks and consider a repeat steroid injection before she goes on vacation  Update 05/07/2022 Joan [name] is a 66 y.o. female coming in with complaint of L knee pain.  Had visco 02/26/22  Patient states that the gel did not provider her with any relief. Does feel like cortisone is helpful.   States that her back pain is increasing since last visit. Last nerve root injection in November 2022. Also c/o B shoulder pain that occurs intermittently. No new injury.   Taking multiple supplements and is unsure if she should modify dose.        Past Medical History:  Diagnosis Date   Allergy    Anemia    due to Dysmenorrhea   Arthritis    generalized    Hypertension    initially after BCP initiated    Thyroid disease 2005 & 2009   nodule   Past Surgical History:  Procedure Laterality Date   ABDOMINAL HYSTERECTOMY  1998   for heavy menses and fibroids   BIOPSY THYROID  2005,2009   both negative   CESAREAN SECTION     x2   COLONOSCOPY  2009    Dr [facility]   EXPLORATORY LAPAROTOMY  1985   Ovarian cyst   [place]    LLQ pain due to ovarian cyst   SIGMOIDOSCOPY     TONSILLECTOMY     Social History   Socioeconomic History   Marital status: Married    Spouse name: Not on file   Number of children: Not on file   Years of education: Not on file   Highest education level: Not  on file  Occupational History   Not on file  Tobacco Use   Smoking status: Never   Smokeless tobacco: Never  Substance and Sexual Activity   Alcohol use: No   Drug use: No   Sexual activity: Not Currently  Other Topics Concern   Not on file  Social History Narrative   RN at [employer]      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      Social Determinants of Health   Financial Resource Strain: Low Risk  (04/05/2022)   Overall Financial Resource Strain (CARDIA)    Difficulty of Paying Living Expenses: Not hard at all  Food Insecurity: No Food Insecurity (04/05/2022)   Hunger Vital Sign    Worried About Running Out of Food in the Last Year: Never true    [place] in the Last Year: Never true  Transportation Needs: No Transportation Needs (04/05/2022)   PRAPARE - Hydrologist (Medical): No    Lack of Transportation (Non-Medical): No  Physical Activity: Sufficiently Active (04/05/2022)   Exercise Vital Sign    Days of Exercise per Week: 5 days    Minutes of Exercise per Session: 30 min  Stress: No Stress Concern Present (  04/05/2022)   [place]    Feeling of Stress : Not at all  Social Connections: [place] (04/05/2022)   Social Connection and Isolation Panel [NHANES]    Frequency of Communication with Friends and Family: More than three times a week    Frequency of Social Gatherings with Friends and Family: Not on file    Attends Religious Services: More than 4 times per year    Active Member of [employer] or Organizations: Yes    Attends Music therapist: More than 4 times per year    Marital Status: Married   Allergies  Allergen Reactions   Sulfonamide Derivatives     REACTION: RASH Medi Alert bracelet  is recommended     Codeine Nausea Only   Tramadol Nausea And Vomiting   Family History  Problem Relation Age of Onset   Hypertension Father    Kidney failure  Father        Bright's Disease; died @ 85   Hypertension Mother    Hyperlipidemia Mother    Osteoarthritis Mother    Hypertension Brother    Diabetes Brother    Stroke Maternal Grandmother        in 88s   Hypertension Maternal Aunt        also DJD/ OA   Breast cancer Maternal Aunt    Heart attack Maternal Grandfather 69       also  CVA   Heart block Paternal Grandmother        pacemaker   Colon cancer Neg Hx    Colon polyps Neg Hx    Esophageal cancer Neg Hx    Rectal cancer Neg Hx    Stomach cancer Neg Hx     Current Outpatient Medications (Endocrine & Metabolic):    estradiol (VIVELLE-DOT) 0.025 MG/24HR, Place 1 patch onto the skin 2 (two) times a week.     SYNTHROID 100 MCG tablet, Take 1 tablet by mouth in the morning on an empty stomach  Current Outpatient Medications (Cardiovascular):    amLODipine-benazepril (LOTREL) 5-20 MG capsule, Take 1 capsule by mouth daily. Keep scheduled appt for future refills   triamterene-hydrochlorothiazide (DYAZIDE) 37.5-25 MG capsule, Take 1 capsule by mouth daily.  Current Outpatient Medications (Respiratory):    Loratadine (CLARITIN PO), Take by mouth daily.  Current Outpatient Medications (Analgesics):    naproxen sodium (ALEVE) 220 MG tablet, 1 tablet with food or milk as needed   Current Outpatient Medications (Other):    Glucosamine-Chondroit-Vit C-Mn (GLUCOSAMINE 1500 COMPLEX PO), Take 1,500 mg by mouth 2 (two) times daily. Glucosamine 1500 mg bid; Chondrotin 1100 mg bid   Omega-3 Fatty Acids (FISH OIL OMEGA-3 PO), Take by mouth 2 (two) times daily.   scopolamine (TRANSDERM-SCOP) 1 MG/3DAYS, scopolamine 1 mg over 3 days transdermal patch   scopolamine (TRANSDERM-SCOP) 1 MG/3DAYS, Place 1 patch (1.5 mg total) onto the skin every 3 (three) days as directed.   TART CHERRY PO, Take 1,200 mg by mouth daily.   Turmeric 500 MG CAPS, Take 1,500 mg by mouth.   Vitamin D, Ergocalciferol, (DRISDOL) 1.25 MG (50000 UNIT) CAPS capsule, Take  1 capsule by mouth once a week   Reviewed prior external information including notes and imaging from  primary care provider As well as notes that were available from care everywhere and other healthcare systems.  Past medical history, social, surgical and family history all reviewed in electronic medical record.  No pertanent information unless stated  regarding to the chief complaint.   Review of Systems:  No headache, visual changes, nausea, vomiting, diarrhea, constipation, dizziness, abdominal pain, skin rash, fevers, chills, night sweats, weight loss, swollen lymph nodes, body aches,, chest pain, shortness of breath, mood changes. POSITIVE muscle aches, joint swelling  Objective  Blood pressure 102/80, pulse 79, height '5\' 4"'$  (1.626 m), weight 198 lb (89.8 kg), SpO2 96 %.   General: No apparent distress alert and oriented x3 mood and affect normal, dressed appropriately.  HEENT: Pupils equal, extraocular movements intact  Respiratory: Patient's speak in full sentences and does not appear short of breath  Cardiovascular: No lower extremity edema, non tender, no erythema  MSK: Low back exam does have some loss of lordosis.  Limited range of motion.  Worsening pain with extension of the back.  Does have tightness noted with straight leg test bilaterally.  Left knee does have a effusion noted.  Nontender to palpation diffusely.  Instability noted with valgus and varus force.  Lacks last 5 degrees of flexion  After informed written and verbal consent, patient was seated on exam table. Left knee was prepped with alcohol swab and utilizing anterolateral approach, patient's left knee space was injected with 4:1  marcaine 0.5%: Kenalog '40mg'$ /dL. Patient tolerated the procedure well without immediate complications.     Impression and Recommendations:     The above documentation has been reviewed and is accurate and complete Lyndal Pulley, DO

## 2022-05-07 ENCOUNTER — Encounter: Payer: Self-pay | Admitting: Family Medicine

## 2022-05-07 ENCOUNTER — Ambulatory Visit (INDEPENDENT_AMBULATORY_CARE_PROVIDER_SITE_OTHER): Payer: Medicare Other | Admitting: Family Medicine

## 2022-05-07 VITALS — BP 102/80 | HR 79 | Ht 64.0 in | Wt 198.0 lb

## 2022-05-07 DIAGNOSIS — M5416 Radiculopathy, lumbar region: Secondary | ICD-10-CM

## 2022-05-07 DIAGNOSIS — M1712 Unilateral primary osteoarthritis, left knee: Secondary | ICD-10-CM | POA: Diagnosis not present

## 2022-05-07 NOTE — Patient Instructions (Signed)
Sorry gel didn't work Steroid injection in L knee today Nerve root injection ordered Turmeric '500mg'$   Fish oil '1000mg'$   See me again in 10-12 weeks

## 2022-05-07 NOTE — Assessment & Plan Note (Signed)
Arthritic changes noted.  Has responded to injections previously and we will repeat the injections were patient has had.  Hopefully patient will make a response before her trip and have a better quality of life for some time.  Still wants to avoid any surgical intervention.

## 2022-05-07 NOTE — Assessment & Plan Note (Signed)
Patient had repeat injection today.  Chronic problem with worsening symptoms.  Moderate to severe arthritic changes mostly of the medial joint space.  Patient responded to the injection now and hopefully will continue to do.  Discussed icing regimen and home exercises otherwise.  Follow-up with me again 6 to 8 weeks

## 2022-05-14 ENCOUNTER — Ambulatory Visit
Admission: RE | Admit: 2022-05-14 | Discharge: 2022-05-14 | Disposition: A | Discharge status: Home / Self Care | Payer: Medicare Other | Source: Ambulatory Visit | Attending: Family Medicine | Admitting: Family Medicine

## 2022-05-14 DIAGNOSIS — M47817 Spondylosis without myelopathy or radiculopathy, lumbosacral region: Secondary | ICD-10-CM | POA: Diagnosis not present

## 2022-05-14 DIAGNOSIS — M5416 Radiculopathy, lumbar region: Secondary | ICD-10-CM

## 2022-05-14 MED ORDER — IOPAMIDOL (ISOVUE-M 200) INJECTION 41%
1.0000 mL | Freq: Once | INTRAMUSCULAR | Status: AC
  Administered 2022-05-14: 1 mL via EPIDURAL

## 2022-05-14 MED ORDER — METHYLPREDNISOLONE ACETATE 40 MG/ML INJ SUSP (RADIOLOG
80.0000 mg | Freq: Once | INTRAMUSCULAR | Status: AC
  Administered 2022-05-14: 80 mg via EPIDURAL

## 2022-05-14 NOTE — Discharge Instructions (Signed)

## 2022-05-16 ENCOUNTER — Other Ambulatory Visit (HOSPITAL_COMMUNITY): Payer: Self-pay

## 2022-05-16 MED ORDER — VITAMIN D (ERGOCALCIFEROL) 1.25 MG (50000 UNIT) PO CAPS
50000.0000 [IU] | ORAL_CAPSULE | ORAL | 4 refills | Status: DC
  Filled 2022-05-16: qty 12, 84d supply, fill #0
  Filled 2022-08-01: qty 12, 84d supply, fill #1
  Filled 2022-10-21: qty 12, 84d supply, fill #2
  Filled 2023-01-31: qty 12, 84d supply, fill #3
  Filled 2023-04-21: qty 12, 84d supply, fill #4

## 2022-06-05 ENCOUNTER — Other Ambulatory Visit (HOSPITAL_COMMUNITY): Payer: Self-pay

## 2022-06-05 DIAGNOSIS — Z01419 Encounter for gynecological examination (general) (routine) without abnormal findings: Secondary | ICD-10-CM | POA: Diagnosis not present

## 2022-06-05 DIAGNOSIS — L292 Pruritus vulvae: Secondary | ICD-10-CM | POA: Diagnosis not present

## 2022-06-05 DIAGNOSIS — Z124 Encounter for screening for malignant neoplasm of cervix: Secondary | ICD-10-CM | POA: Diagnosis not present

## 2022-06-05 DIAGNOSIS — Z79890 Hormone replacement therapy (postmenopausal): Secondary | ICD-10-CM | POA: Diagnosis not present

## 2022-06-05 DIAGNOSIS — N941 Unspecified dyspareunia: Secondary | ICD-10-CM | POA: Diagnosis not present

## 2022-06-05 MED ORDER — ESTRADIOL 0.05 MG/24HR TD PTTW
MEDICATED_PATCH | TRANSDERMAL | 4 refills | Status: DC
  Filled 2022-06-05: qty 24, 84d supply, fill #0
  Filled 2023-02-15: qty 24, 84d supply, fill #1

## 2022-06-05 MED ORDER — NYSTATIN-TRIAMCINOLONE 100000-0.1 UNIT/GM-% EX OINT
TOPICAL_OINTMENT | CUTANEOUS | 1 refills | Status: DC
  Filled 2022-06-05: qty 30, 10d supply, fill #0

## 2022-06-06 ENCOUNTER — Other Ambulatory Visit (HOSPITAL_COMMUNITY): Payer: Self-pay

## 2022-06-10 ENCOUNTER — Other Ambulatory Visit (HOSPITAL_COMMUNITY): Payer: Self-pay

## 2022-06-11 DIAGNOSIS — K7689 Other specified diseases of liver: Secondary | ICD-10-CM | POA: Diagnosis not present

## 2022-06-11 DIAGNOSIS — R7301 Impaired fasting glucose: Secondary | ICD-10-CM | POA: Diagnosis not present

## 2022-06-11 DIAGNOSIS — E7800 Pure hypercholesterolemia, unspecified: Secondary | ICD-10-CM | POA: Diagnosis not present

## 2022-06-11 DIAGNOSIS — E012 Iodine-deficiency related (endemic) goiter, unspecified: Secondary | ICD-10-CM | POA: Diagnosis not present

## 2022-06-11 DIAGNOSIS — E559 Vitamin D deficiency, unspecified: Secondary | ICD-10-CM | POA: Diagnosis not present

## 2022-06-11 DIAGNOSIS — E890 Postprocedural hypothyroidism: Secondary | ICD-10-CM | POA: Diagnosis not present

## 2022-06-11 DIAGNOSIS — I10 Essential (primary) hypertension: Secondary | ICD-10-CM | POA: Diagnosis not present

## 2022-06-11 DIAGNOSIS — M8580 Other specified disorders of bone density and structure, unspecified site: Secondary | ICD-10-CM | POA: Diagnosis not present

## 2022-07-09 ENCOUNTER — Other Ambulatory Visit (HOSPITAL_COMMUNITY): Payer: Self-pay

## 2022-07-17 ENCOUNTER — Other Ambulatory Visit: Payer: Self-pay | Admitting: Internal Medicine

## 2022-07-17 ENCOUNTER — Other Ambulatory Visit (HOSPITAL_COMMUNITY): Payer: Self-pay

## 2022-07-17 MED ORDER — AMLODIPINE BESY-BENAZEPRIL HCL 5-20 MG PO CAPS
1.0000 | ORAL_CAPSULE | Freq: Every day | ORAL | 3 refills | Status: DC
  Filled 2022-07-17: qty 90, 90d supply, fill #0
  Filled 2022-10-21: qty 90, 90d supply, fill #1
  Filled 2023-01-20: qty 90, 90d supply, fill #2
  Filled 2023-04-21: qty 90, 90d supply, fill #3

## 2022-07-24 ENCOUNTER — Other Ambulatory Visit: Payer: Self-pay | Admitting: Internal Medicine

## 2022-07-24 DIAGNOSIS — Z1231 Encounter for screening mammogram for malignant neoplasm of breast: Secondary | ICD-10-CM

## 2022-08-01 ENCOUNTER — Other Ambulatory Visit: Payer: Self-pay | Admitting: Internal Medicine

## 2022-08-01 ENCOUNTER — Other Ambulatory Visit (HOSPITAL_COMMUNITY): Payer: Self-pay

## 2022-08-01 MED ORDER — TRIAMTERENE-HCTZ 37.5-25 MG PO CAPS
1.0000 | ORAL_CAPSULE | Freq: Every day | ORAL | 3 refills | Status: DC
  Filled 2022-08-01: qty 90, 90d supply, fill #0
  Filled 2022-10-21: qty 90, 90d supply, fill #1
  Filled 2023-01-20: qty 90, 90d supply, fill #2
  Filled 2023-04-21: qty 90, 90d supply, fill #3

## 2022-08-05 ENCOUNTER — Other Ambulatory Visit (HOSPITAL_COMMUNITY): Payer: Self-pay

## 2022-08-06 NOTE — Progress Notes (Unsigned)
[place] [facility] [address] [place] Phone: [phone] Subjective:   IVilma Meckel, am serving as a scribe for Dr. Hulan Saas.  I'm seeing this patient by the request  of:  [name], MD  CC: Left knee pain follow-up  [id number]  05/07/2022 Patient had repeat injection today.  Chronic problem with worsening symptoms.  Moderate to severe arthritic changes mostly of the medial joint space.  Patient responded to the injection now and hopefully will continue to do.  Discussed icing regimen and home exercises otherwise.  Follow-up with me again 6 to 8 weeks  Arthritic changes noted.  Has responded to injections previously and we will repeat the injections were patient has had.  Hopefully patient will make a response before her trip and have a better quality of life for some time.  Still wants to avoid any surgical intervention.  Update 08/07/2022 Joan [name] is a 66 y.o. female coming in with complaint of LBP and L knee pain. Nerve Root injection R L3 on 05/14/2022. Would like injection in left knee. Injection for back has helped a lot.  Patient states not having any other significant pain she was having previously in the back of the hip.  All seems to be now in the knee.  Having more instability noted.       Past Medical History:  Diagnosis Date   Allergy    Anemia    due to Dysmenorrhea   Arthritis    generalized    Hypertension    initially after BCP initiated    Thyroid disease 2005 & 2009   nodule   Past Surgical History:  Procedure Laterality Date   ABDOMINAL HYSTERECTOMY  1998   for heavy menses and fibroids   BIOPSY THYROID  2005,2009   both negative   CESAREAN SECTION     x2   COLONOSCOPY  2009    Dr [facility]   EXPLORATORY LAPAROTOMY  1985   Ovarian cyst   [place]    LLQ pain due to ovarian cyst   SIGMOIDOSCOPY     TONSILLECTOMY     Social History   Socioeconomic History    Marital status: Married    Spouse name: Not on file   Number of children: Not on file   Years of education: Not on file   Highest education level: Not on file  Occupational History   Not on file  Tobacco Use   Smoking status: Never   Smokeless tobacco: Never  Substance and Sexual Activity   Alcohol use: No   Drug use: No   Sexual activity: Not Currently  Other Topics Concern   Not on file  Social History Narrative   RN at [employer]      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      Social Determinants of Health   Financial Resource Strain: Low Risk  (04/05/2022)   Overall Financial Resource Strain (CARDIA)    Difficulty of Paying Living Expenses: Not hard at all  Food Insecurity: No Food Insecurity (04/05/2022)   Hunger Vital Sign    Worried About Running Out of Food in the Last Year: Never true    [place] in the Last Year: Never true  Transportation Needs: No Transportation Needs (04/05/2022)   PRAPARE - Hydrologist (Medical): No    Lack of Transportation (Non-Medical): No  Physical Activity: Sufficiently Active (04/05/2022)   Exercise  Vital Sign    Days of Exercise per Week: 5 days    Minutes of Exercise per Session: 30 min  Stress: No Stress Concern Present (04/05/2022)   [place]    Feeling of Stress : Not at all  Social Connections: Socially [place] (04/05/2022)   Social Connection and Isolation Panel [NHANES]    Frequency of Communication with Friends and Family: More than three times a week    Frequency of Social Gatherings with Friends and Family: Not on file    Attends Religious Services: More than 4 times per year    Active Member of [employer] or Organizations: Yes    Attends Music therapist: More than 4 times per year    Marital Status: Married   Allergies  Allergen Reactions   Sulfonamide Derivatives     REACTION: RASH Medi Alert bracelet  is  recommended     Codeine Nausea Only   Tramadol Nausea And Vomiting   Family History  Problem Relation Age of Onset   Hypertension Father    Kidney failure Father        Bright's Disease; died @ 44   Hypertension Mother    Hyperlipidemia Mother    Osteoarthritis Mother    Hypertension Brother    Diabetes Brother    Stroke Maternal Grandmother        in 87s   Hypertension Maternal Aunt        also DJD/ OA   Breast cancer Maternal Aunt    Heart attack Maternal Grandfather 69       also  CVA   Heart block Paternal Grandmother        pacemaker   Colon cancer Neg Hx    Colon polyps Neg Hx    Esophageal cancer Neg Hx    Rectal cancer Neg Hx    Stomach cancer Neg Hx     Current Outpatient Medications (Endocrine & Metabolic):    predniSONE (DELTASONE) 20 MG tablet, Take 2 tablets (40 mg total) by mouth daily with breakfast.   estradiol (VIVELLE-DOT) 0.025 MG/24HR, Place 1 patch onto the skin 2 (two) times a week.     estradiol (VIVELLE-DOT) 0.05 MG/24HR patch, APPLY 1 PATCH ONTO THE SKIN 2 TIMES WEEKLY   SYNTHROID 100 MCG tablet, Take 1 tablet by mouth in the morning on an empty stomach  Current Outpatient Medications (Cardiovascular):    amLODipine-benazepril (LOTREL) 5-20 MG capsule, Take 1 capsule by mouth daily. Keep scheduled appt for future refills   triamterene-hydrochlorothiazide (DYAZIDE) 37.5-25 MG capsule, Take 1 capsule total by mouth daily.  Current Outpatient Medications (Respiratory):    Loratadine (CLARITIN PO), Take by mouth daily.  Current Outpatient Medications (Analgesics):    naproxen sodium (ALEVE) 220 MG tablet, 1 tablet with food or milk as needed   Current Outpatient Medications (Other):    Glucosamine-Chondroit-Vit C-Mn (GLUCOSAMINE 1500 COMPLEX PO), Take 1,500 mg by mouth 2 (two) times daily. Glucosamine 1500 mg bid; Chondrotin 1100 mg bid   nystatin-triamcinolone ointment (MYCOLOG), APPLY TO THE AFFECTED AREA(S) 2 TIMES PER DAY   Omega-3 Fatty  Acids (FISH OIL OMEGA-3 PO), Take by mouth 2 (two) times daily.   scopolamine (TRANSDERM-SCOP) 1 MG/3DAYS, scopolamine 1 mg over 3 days transdermal patch   scopolamine (TRANSDERM-SCOP) 1 MG/3DAYS, Place 1 patch (1.5 mg total) onto the skin every 3 (three) days as directed.   TART CHERRY PO, Take 1,200 mg by mouth daily.   Turmeric 500  MG CAPS, Take 1,500 mg by mouth.   Vitamin D, Ergocalciferol, (DRISDOL) 1.25 MG (50000 UNIT) CAPS capsule, Take 1 capsule by mouth once a week   Reviewed prior external information including notes and imaging from  primary care provider As well as notes that were available from care everywhere and other healthcare systems.  Past medical history, social, surgical and family history all reviewed in electronic medical record.  No pertanent information unless stated regarding to the chief complaint.   Review of Systems:  No headache, visual changes, nausea, vomiting, diarrhea, constipation, dizziness, abdominal pain, skin rash, fevers, chills, night sweats, weight loss, swollen lymph nodes, body aches,  chest pain, shortness of breath, mood changes. POSITIVE muscle aches, joint swelling  Objective  Blood pressure (!) 126/90, pulse 93, height '5\' 4"'$  (1.626 m), weight 194 lb (88 kg), SpO2 96 %.   General: No apparent distress alert and oriented x3 mood and affect normal, dressed appropriately.  HEENT: Pupils equal, extraocular movements intact  Respiratory: Patient's speak in full sentences and does not appear short of breath  Cardiovascular: No lower extremity edema, non tender, no erythema  Antalgic gait noted.  Patient does have some instability noted of the knee.  Trace effusion noted as well as a PFJ  After informed written and verbal consent, patient was seated on exam table. Left knee was prepped with alcohol swab and utilizing anterolateral approach, patient's left knee space was injected with 4:1  marcaine 0.5%: Kenalog '40mg'$ /dL. Patient tolerated the  procedure well without immediate complications.    Impression and Recommendations:    The above documentation has been reviewed and is accurate and complete Lyndal Pulley, DO

## 2022-08-07 ENCOUNTER — Other Ambulatory Visit (HOSPITAL_COMMUNITY): Payer: Self-pay

## 2022-08-07 ENCOUNTER — Encounter: Payer: Self-pay | Admitting: Family Medicine

## 2022-08-07 ENCOUNTER — Ambulatory Visit (INDEPENDENT_AMBULATORY_CARE_PROVIDER_SITE_OTHER): Payer: Medicare Other | Admitting: Family Medicine

## 2022-08-07 DIAGNOSIS — Z1283 Encounter for screening for malignant neoplasm of skin: Secondary | ICD-10-CM | POA: Diagnosis not present

## 2022-08-07 DIAGNOSIS — E559 Vitamin D deficiency, unspecified: Secondary | ICD-10-CM | POA: Diagnosis not present

## 2022-08-07 DIAGNOSIS — L658 Other specified nonscarring hair loss: Secondary | ICD-10-CM | POA: Diagnosis not present

## 2022-08-07 DIAGNOSIS — L578 Other skin changes due to chronic exposure to nonionizing radiation: Secondary | ICD-10-CM | POA: Diagnosis not present

## 2022-08-07 DIAGNOSIS — M1712 Unilateral primary osteoarthritis, left knee: Secondary | ICD-10-CM

## 2022-08-07 MED ORDER — PREDNISONE 20 MG PO TABS
40.0000 mg | ORAL_TABLET | Freq: Every day | ORAL | 0 refills | Status: DC
  Filled 2022-08-07: qty 10, 5d supply, fill #0

## 2022-08-07 NOTE — Patient Instructions (Addendum)
See me in 3 months Prednisone '40mg'$  for 5 days

## 2022-08-07 NOTE — Assessment & Plan Note (Signed)
Left knee pain discussed HEP  Chronic left with exacerbation  End stage arthritic changes. Patient given another injection with patient traveling.  Did not feel that the viscosupplementation made significant differences but we will consider this still as another treatment option.  Did discuss the possibility of PRP as well for repeating steroid injection every 3 months safely.  Patient will follow-up again in 3 months.  Still wants to avoid surgical intervention.

## 2022-09-09 ENCOUNTER — Ambulatory Visit
Admission: RE | Admit: 2022-09-09 | Discharge: 2022-09-09 | Disposition: A | Discharge status: Home / Self Care | Payer: Medicare Other | Source: Ambulatory Visit | Attending: Internal Medicine | Admitting: Internal Medicine

## 2022-09-09 DIAGNOSIS — Z1231 Encounter for screening mammogram for malignant neoplasm of breast: Secondary | ICD-10-CM | POA: Diagnosis not present

## 2022-09-16 DIAGNOSIS — Z23 Encounter for immunization: Secondary | ICD-10-CM | POA: Diagnosis not present

## 2022-09-23 NOTE — Progress Notes (Unsigned)
[place] Phone: [phone] Subjective:   Joan Joan [name], am serving as a scribe for Dr. Hulan Joan [name].  I'm seeing this patient by the request  of:  Joan Rail, MD  CC: Knee pain follow-up  [id number]  08/07/2022 Left knee pain discussed HEP  Chronic left with exacerbation  End stage arthritic changes. Patient given another injection with patient traveling.  Did not feel that the viscosupplementation made significant differences but we will consider this still as another treatment option.  Did discuss the possibility of PRP as well for repeating steroid injection every 3 months safely.  Patient will follow-up again in 3 months.  Still wants to avoid surgical intervention.'  Update 09/24/2022 Joan Joan [name] is a 65 y.o. female coming in with complaint of L knee pain. Patient states that injection did not help. Pain continues over medial aspect. Took prednisone on her trip but her pain came back after taking it.   Also c/o B SI joint pain L>R. Denies any radiating symptoms. Using Arnica lotion.      Past Medical History:  Diagnosis Date   Allergy    Anemia    due to Dysmenorrhea   Arthritis    generalized    Hypertension    initially after BCP initiated    Thyroid disease 2005 & 2009   nodule   Past Surgical History:  Procedure Laterality Date   ABDOMINAL HYSTERECTOMY  1998   for heavy menses and fibroids   BIOPSY THYROID  2005,2009   both negative   CESAREAN SECTION     x2   COLONOSCOPY  2009    Dr [name]   EXPLORATORY LAPAROTOMY  1985   Ovarian cyst   [place]    LLQ pain due to ovarian cyst   SIGMOIDOSCOPY     TONSILLECTOMY     Social History   Socioeconomic History   Marital status: Married    Spouse name: Not on file   Number of children: Not on file   Years of education: Not on file   Highest education level: Not on file  Occupational History    Not on file  Tobacco Use   Smoking status: Never   Smokeless tobacco: Never  Substance and Sexual Activity   Alcohol use: [name]   Drug use: [name]   Sexual activity: Not Currently  Other Topics Concern   Not on file  Social History Narrative   RN at [name]      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      Social Determinants of Health   Financial Resource Strain: Low Risk  (04/05/2022)   Overall Financial Resource Strain (CARDIA)    Difficulty of Paying Living Expenses: Not hard at all  Food Insecurity: Joan [name] Food Insecurity (04/05/2022)   Hunger Vital Sign    Worried About Running Out of Food in the Last Year: Never true    [name] in the Last Year: Never true  Transportation Needs: Joan [name] Transportation Needs (04/05/2022)   PRAPARE - Hydrologist (Medical): Joan [name]    Lack of Transportation (Non-Medical): Joan [name]  Physical Activity: Sufficiently Active (04/05/2022)   Exercise Vital Sign    Days of Exercise per Week: 5 days    Minutes of Exercise per Session: 30 min  Stress: Joan [name] Stress Concern Present (04/05/2022)   [name]  Feeling of Stress : Not at all  Social Connections: Socially Integrated (04/05/2022)   Social Connection and Isolation Panel [NHANES]    Frequency of Communication with Friends and Family: More than three times a week    Frequency of Social Gatherings with Friends and Family: Not on file    Attends Religious Services: More than 4 times per year    Active Member of [employer] or Organizations: Yes    Attends Music therapist: More than 4 times per year    Marital Status: Married   Allergies  Allergen Reactions   Sulfonamide Derivatives     REACTION: RASH Medi Alert bracelet  is recommended     Codeine Nausea Only   Tramadol Nausea And Vomiting   Family History  Problem Relation Age of Onset   Hypertension Father    Kidney failure Father        Bright's Disease;  died @ 59   Hypertension Mother    Hyperlipidemia Mother    Osteoarthritis Mother    Hypertension Brother    Diabetes Brother    Stroke Maternal Grandmother        in 27s   Hypertension Maternal Aunt        also DJD/ OA   Breast cancer Maternal Aunt    Heart attack Maternal Grandfather 69       also  CVA   Heart block Paternal Grandmother        pacemaker   Colon cancer Neg Hx    Colon polyps Neg Hx    Esophageal cancer Neg Hx    Rectal cancer Neg Hx    Stomach cancer Neg Hx     Current Outpatient Medications (Endocrine & Metabolic):    estradiol (VIVELLE-DOT) 0.025 MG/24HR, Place 1 patch onto the skin 2 (two) times a week.     estradiol (VIVELLE-DOT) 0.05 MG/24HR patch, APPLY 1 PATCH ONTO THE SKIN 2 TIMES WEEKLY   predniSONE (DELTASONE) 20 MG tablet, Take 2 tablets (40 mg total) by mouth daily with breakfast.   SYNTHROID 100 MCG tablet, Take 1 tablet by mouth in the morning on an empty stomach  Current Outpatient Medications (Cardiovascular):    amLODipine-benazepril (LOTREL) 5-20 MG capsule, Take 1 capsule by mouth daily. Keep scheduled appt for future refills   triamterene-hydrochlorothiazide (DYAZIDE) 37.5-25 MG capsule, Take 1 capsule total by mouth daily.  Current Outpatient Medications (Respiratory):    Loratadine (CLARITIN PO), Take by mouth daily.  Current Outpatient Medications (Analgesics):    naproxen sodium (ALEVE) 220 MG tablet, 1 tablet with food or milk as needed   Current Outpatient Medications (Other):    Glucosamine-Chondroit-Vit C-Mn (GLUCOSAMINE 1500 COMPLEX PO), Take 1,500 mg by mouth 2 (two) times daily. Glucosamine 1500 mg bid; Chondrotin 1100 mg bid   nystatin-triamcinolone ointment (MYCOLOG), APPLY TO THE AFFECTED AREA(S) 2 TIMES PER DAY   Omega-3 Fatty Acids (FISH OIL OMEGA-3 PO), Take by mouth 2 (two) times daily.   scopolamine (TRANSDERM-SCOP) 1 MG/3DAYS, scopolamine 1 mg over 3 days transdermal patch   scopolamine (TRANSDERM-SCOP) 1 MG/3DAYS,  Place 1 patch (1.5 mg total) onto the skin every 3 (three) days as directed.   TART CHERRY PO, Take 1,200 mg by mouth daily.   Turmeric 500 MG CAPS, Take 1,500 mg by mouth.   Vitamin D, Ergocalciferol, (DRISDOL) 1.25 MG (50000 UNIT) CAPS capsule, Take 1 capsule by mouth once a week   Reviewed prior external information including notes and imaging from  primary care provider As  well as notes that were available from care everywhere and other healthcare systems.  Past medical history, social, surgical and family history all reviewed in electronic medical record.  Joan [name] pertanent information unless stated regarding to the chief complaint.   Review of Systems:  Joan [name] headache, visual changes, nausea, vomiting, diarrhea, constipation, dizziness, abdominal pain, skin rash, fevers, chills, night sweats, weight loss, swollen lymph nodes, body aches, joint swelling, chest pain, shortness of breath, mood changes. POSITIVE muscle aches  Objective  Blood pressure 128/86, pulse 83, height '5\' 4"'$  (1.626 m), weight 197 lb (89.4 kg), SpO2 97 %.   General: Joan [name] apparent distress alert and oriented x3 mood and affect normal, dressed appropriately.  HEENT: Pupils equal, extraocular movements intact  Respiratory: Patient's speak in full sentences and does not appear short of breath  Cardiovascular: Joan [name] lower extremity edema, non tender, Joan [name] erythema  Severely antalgic gait noted.  Instability of the left knee noted.  Patient does have crepitus.  Abnormality noted with valgus and varus force.    Impression and Recommendations:     The above documentation has been reviewed and is accurate and complete Lyndal Pulley, DO

## 2022-09-24 ENCOUNTER — Ambulatory Visit (INDEPENDENT_AMBULATORY_CARE_PROVIDER_SITE_OTHER): Payer: Medicare Other | Admitting: Family Medicine

## 2022-09-24 VITALS — BP 128/86 | HR 83 | Ht 64.0 in | Wt 197.0 lb

## 2022-09-24 DIAGNOSIS — M1712 Unilateral primary osteoarthritis, left knee: Secondary | ICD-10-CM | POA: Diagnosis not present

## 2022-09-24 DIAGNOSIS — G8929 Other chronic pain: Secondary | ICD-10-CM

## 2022-09-24 DIAGNOSIS — M25562 Pain in left knee: Secondary | ICD-10-CM

## 2022-09-24 NOTE — Assessment & Plan Note (Signed)
DJD severe bone on bone failed all conservative therapy at this point.  Patient does have some instability noted.  Patient has not responded well to the injections including steroid, viscosupplementation or the possibility of physical therapy.  I do believe at this point surgical intervention may be necessary and be referred to orthopedic surgery to discuss different treatment options.  Patient is in agreement with plan and will call us if anything else is necessary.

## 2022-09-24 NOTE — Patient Instructions (Signed)
Dr. Kerby Nora We are here if you have questions

## 2022-09-25 ENCOUNTER — Ambulatory Visit (INDEPENDENT_AMBULATORY_CARE_PROVIDER_SITE_OTHER): Payer: Medicare Other | Admitting: Orthopaedic Surgery

## 2022-09-25 VITALS — Ht 64.0 in | Wt 197.0 lb

## 2022-09-25 DIAGNOSIS — M25562 Pain in left knee: Secondary | ICD-10-CM | POA: Diagnosis not present

## 2022-09-25 DIAGNOSIS — M1712 Unilateral primary osteoarthritis, left knee: Secondary | ICD-10-CM | POA: Insufficient documentation

## 2022-09-25 DIAGNOSIS — G8929 Other chronic pain: Secondary | ICD-10-CM

## 2022-09-25 NOTE — Progress Notes (Signed)
Office Visit Note   Patient: Joan [name]           Date of Birth: 1956/08/01           MRN: [id number] Visit Date: 09/25/2022              Requested by: Lyndal Pulley, DO [place],  [place] [address] PCP: [name], MD   Assessment & Plan: Visit Diagnoses:  1. Chronic pain of left knee   2. Unilateral primary osteoarthritis, left knee     Plan: At this point given the failure of conservative treatment for her left knee for over a year now including all modalities as well as arthroscopic surgery left knee, a knee replacement for left knee is warranted.  I showed her knee replacement model and discussed in detail what the surgery involves.  I discussed the risks and benefits of surgery and what to expect from an intraoperative and postoperative course.  All questions and concerns were answered and addressed.  I went over her x-rays and a knee replacement model as well.  We will work on getting this scheduled in the near future.  Follow-Up Instructions: Return for 2 weeks post-op.   Orders:  No orders of the defined types were placed in this encounter.  No orders of the defined types were placed in this encounter.     Procedures: No procedures performed   Clinical Data: No additional findings.   Subjective: Chief Complaint  Patient presents with   Left Knee - Pain  The patient is a very pleasant 66 year old female sent from Dr. Hulan Saas to evaluate and treat known arthritis of her left knee.  She is very active.  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  Her left knee pain has become daily and is detrimentally affecting her mobility, her quality of life and actives daily living.  There are x-rays and MRI on the canopy system from last year.  She had arthroscopic surgery with on her left knee many years ago and it did not help her at all.  She has tried multiple injections in her knee and is worked on a few modification and flushing the exercises.   At this point she wishes to discuss knee replacement surgery.  She is not a diabetic.  Her BMI is 33.81.  HPI  Review of Systems There is currently listed no chest pain, shortness of breath, fever, chills, nausea, vomiting  Objective: Vital Signs: Ht '5\' 4"'$  (1.626 m)   Wt 197 lb (89.4 kg)   BMI 33.81 kg/m   Physical Exam She is alert and orient x3 and in no acute distress Ortho Exam Examination of her left knee shows varus malalignment that is correctable.  There is just a mild effusion.  There is pain throughout the arc of motion of the knee and most of her pain along the medial joint line and the patellofemoral joint. Specialty Comments:  No specialty comments available.  Imaging: No results found. X-rays of her left knee from September of last year shows varus malalignment with medial joint space narrowing and patellofemoral narrowing.  This is definitely tricompartment arthritis and is getting worse with time.  PMFS History: Patient Active Problem List   Diagnosis Date Noted   Unilateral primary osteoarthritis, left knee 09/25/2022   Degenerative arthritis of left knee 08/20/2021   Degenerative spondylolisthesis 03/31/2020   Lumbar radiculopathy 03/28/2020   Mid back pain on right side 09/28/2018   Osteopenia  08/19/2018   Hyperglycemia 07/09/2018   Hypothyroidism 06/30/2017   Gallbladder polyp 05/20/2016   Fatty liver 05/20/2016   Obesity 05/01/2016   Vitamin D deficiency 07/14/2013   LOW BACK PAIN SYNDROME 01/23/2010   Status post radioactive iodine thyroid ablation 08/17/2009   Hyperlipidemia 07/26/2008   Essential hypertension 07/26/2008   Pain in joint of left shoulder 07/26/2008   Goiter 07/14/2007   Past Medical History:  Diagnosis Date   Allergy    Anemia    due to Dysmenorrhea   Arthritis    generalized    Hypertension    initially after BCP initiated    Thyroid disease 2005 & 2009   nodule    Family History  Problem Relation Age of Onset    Hypertension Father    Kidney failure Father        Bright's Disease; died @ 57   Hypertension Mother    Hyperlipidemia Mother    Osteoarthritis Mother    Hypertension Brother    Diabetes Brother    Stroke Maternal Grandmother        in 42s   Hypertension Maternal Aunt        also DJD/ OA   Breast cancer Maternal Aunt    Heart attack Maternal Grandfather 69       also  CVA   Heart block Paternal Grandmother        pacemaker   Colon cancer Neg Hx    Colon polyps Neg Hx    Esophageal cancer Neg Hx    Rectal cancer Neg Hx    Stomach cancer Neg Hx     Past Surgical History:  Procedure Laterality Date   ABDOMINAL HYSTERECTOMY  1998   for heavy menses and fibroids   BIOPSY THYROID  2005,2009   both negative   CESAREAN SECTION     x2   COLONOSCOPY  2009    Dr [facility]   EXPLORATORY LAPAROTOMY  1985   Ovarian cyst   FLEXIBLE SIGMOIDOSCOPY  1985    LLQ pain due to ovarian cyst   SIGMOIDOSCOPY     TONSILLECTOMY     Social History   Occupational History   Not on file  Tobacco Use   Smoking status: Never   Smokeless tobacco: Never  Substance and Sexual Activity   Alcohol use: No   Drug use: No   Sexual activity: Not Currently

## 2022-09-30 DIAGNOSIS — E559 Vitamin D deficiency, unspecified: Secondary | ICD-10-CM | POA: Diagnosis not present

## 2022-09-30 DIAGNOSIS — E7800 Pure hypercholesterolemia, unspecified: Secondary | ICD-10-CM | POA: Diagnosis not present

## 2022-09-30 DIAGNOSIS — E890 Postprocedural hypothyroidism: Secondary | ICD-10-CM | POA: Diagnosis not present

## 2022-09-30 DIAGNOSIS — R7301 Impaired fasting glucose: Secondary | ICD-10-CM | POA: Diagnosis not present

## 2022-10-03 ENCOUNTER — Other Ambulatory Visit (HOSPITAL_COMMUNITY): Payer: Self-pay

## 2022-10-04 ENCOUNTER — Other Ambulatory Visit (HOSPITAL_COMMUNITY): Payer: Self-pay

## 2022-10-21 ENCOUNTER — Other Ambulatory Visit (HOSPITAL_COMMUNITY): Payer: Self-pay

## 2022-10-22 ENCOUNTER — Other Ambulatory Visit (HOSPITAL_COMMUNITY): Payer: Self-pay

## 2022-10-28 ENCOUNTER — Other Ambulatory Visit: Payer: Self-pay

## 2022-10-31 NOTE — Progress Notes (Deleted)
[place] Phone: [phone] Subjective:    I'm seeing this patient by the request  of:  [name], MD  CC:   [id number]  09/24/2022 DJD severe bone on bone failed all conservative therapy at this point.  Patient does have some instability noted.  Patient has not responded well to the injections including steroid, viscosupplementation or the possibility of physical therapy.  I do believe at this point surgical intervention may be necessary and be referred to orthopedic surgery to discuss different treatment options.  Patient is in agreement with plan and will call us if anything else is necessary.     Update 11/06/2022 Joan [name] is a 66 y.o. female coming in with complaint of L knee pain. Was referred to Dr. Ninfa Linden. Patient states        Past Medical History:  Diagnosis Date   Allergy    Anemia    due to Dysmenorrhea   Arthritis    generalized    Hypertension    initially after BCP initiated    Thyroid disease 2005 & 2009   nodule   Past Surgical History:  Procedure Laterality Date   ABDOMINAL HYSTERECTOMY  1998   for heavy menses and fibroids   BIOPSY THYROID  2005,2009   both negative   CESAREAN SECTION     x2   COLONOSCOPY  2009    Dr [facility]   EXPLORATORY LAPAROTOMY  1985   Ovarian cyst   [place]    LLQ pain due to ovarian cyst   SIGMOIDOSCOPY     TONSILLECTOMY     Social History   Socioeconomic History   Marital status: Married    Spouse name: Not on file   Number of children: Not on file   Years of education: Not on file   Highest education level: Not on file  Occupational History   Not on file  Tobacco Use   Smoking status: Never   Smokeless tobacco: Never  Substance and Sexual Activity   Alcohol use: No   Drug use: No   Sexual activity: Not Currently  Other Topics Concern   Not on file  Social History Narrative   RN at  [employer]      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      Social Determinants of Health   Financial Resource Strain: Low Risk  (04/05/2022)   Overall Financial Resource Strain (CARDIA)    Difficulty of Paying Living Expenses: Not hard at all  Food Insecurity: No Food Insecurity (04/05/2022)   Hunger Vital Sign    Worried About Running Out of Food in the Last Year: Never true    [place] in the Last Year: Never true  Transportation Needs: No Transportation Needs (04/05/2022)   PRAPARE - Hydrologist (Medical): No    Lack of Transportation (Non-Medical): No  Physical Activity: Sufficiently Active (04/05/2022)   Exercise Vital Sign    Days of Exercise per Week: 5 days    Minutes of Exercise per Session: 30 min  Stress: No Stress Concern Present (04/05/2022)   [place]    Feeling of Stress : Not at all  Social Connections: [place] (04/05/2022)   Social Connection and Isolation Panel [NHANES]    Frequency of Communication with Friends and Family: More than three times a week    Frequency  of Social Gatherings with Friends and Family: Not on file    Attends Religious Services: More than 4 times per year    Active Member of [employer] or Organizations: Yes    Attends Music therapist: More than 4 times per year    Marital Status: Married   Allergies  Allergen Reactions   Sulfonamide Derivatives     REACTION: RASH Medi Alert bracelet  is recommended     Codeine Nausea Only   Tramadol Nausea And Vomiting   Family History  Problem Relation Age of Onset   Hypertension Father    Kidney failure Father        Bright's Disease; died @ 77   Hypertension Mother    Hyperlipidemia Mother    Osteoarthritis Mother    Hypertension Brother    Diabetes Brother    Stroke Maternal Grandmother        in 39s   Hypertension Maternal Aunt        also DJD/ OA   Breast cancer Maternal  Aunt    Heart attack Maternal Grandfather 69       also  CVA   Heart block Paternal Grandmother        pacemaker   Colon cancer Neg Hx    Colon polyps Neg Hx    Esophageal cancer Neg Hx    Rectal cancer Neg Hx    Stomach cancer Neg Hx     Current Outpatient Medications (Endocrine & Metabolic):    estradiol (VIVELLE-DOT) 0.025 MG/24HR, Place 1 patch onto the skin 2 (two) times a week.     estradiol (VIVELLE-DOT) 0.05 MG/24HR patch, APPLY 1 PATCH ONTO THE SKIN 2 TIMES WEEKLY   predniSONE (DELTASONE) 20 MG tablet, Take 2 tablets (40 mg total) by mouth daily with breakfast.   SYNTHROID 100 MCG tablet, Take 1 tablet by mouth in the morning on an empty stomach  Current Outpatient Medications (Cardiovascular):    amLODipine-benazepril (LOTREL) 5-20 MG capsule, Take 1 capsule by mouth daily. Keep scheduled appt for future refills   triamterene-hydrochlorothiazide (DYAZIDE) 37.5-25 MG capsule, Take 1 capsule total by mouth daily.  Current Outpatient Medications (Respiratory):    Loratadine (CLARITIN PO), Take by mouth daily.  Current Outpatient Medications (Analgesics):    naproxen sodium (ALEVE) 220 MG tablet, 1 tablet with food or milk as needed   Current Outpatient Medications (Other):    Glucosamine-Chondroit-Vit C-Mn (GLUCOSAMINE 1500 COMPLEX PO), Take 1,500 mg by mouth 2 (two) times daily. Glucosamine 1500 mg bid; Chondrotin 1100 mg bid   nystatin-triamcinolone ointment (MYCOLOG), APPLY TO THE AFFECTED AREA(S) 2 TIMES PER DAY   Omega-3 Fatty Acids (FISH OIL OMEGA-3 PO), Take by mouth 2 (two) times daily.   scopolamine (TRANSDERM-SCOP) 1 MG/3DAYS, scopolamine 1 mg over 3 days transdermal patch   scopolamine (TRANSDERM-SCOP) 1 MG/3DAYS, Place 1 patch (1.5 mg total) onto the skin every 3 (three) days as directed.   TART CHERRY PO, Take 1,200 mg by mouth daily.   Turmeric 500 MG CAPS, Take 1,500 mg by mouth.   Vitamin D, Ergocalciferol, (DRISDOL) 1.25 MG (50000 UNIT) CAPS capsule, Take  1 capsule by mouth once a week   Reviewed prior external information including notes and imaging from  primary care provider As well as notes that were available from care everywhere and other healthcare systems.  Past medical history, social, surgical and family history all reviewed in electronic medical record.  No pertanent information unless stated regarding to the chief complaint.  Review of Systems:  No headache, visual changes, nausea, vomiting, diarrhea, constipation, dizziness, abdominal pain, skin rash, fevers, chills, night sweats, weight loss, swollen lymph nodes, body aches, joint swelling, chest pain, shortness of breath, mood changes. POSITIVE muscle aches  Objective  There were no vitals taken for this visit.   General: No apparent distress alert and oriented x3 mood and affect normal, dressed appropriately.  HEENT: Pupils equal, extraocular movements intact  Respiratory: Patient's speak in full sentences and does not appear short of breath  Cardiovascular: No lower extremity edema, non tender, no erythema      Impression and Recommendations:

## 2022-11-06 ENCOUNTER — Ambulatory Visit: Payer: Medicare Other | Admitting: Family Medicine

## 2022-11-11 NOTE — Progress Notes (Signed)
COVID Vaccine Completed:  Yes  Date of COVID positive in last 90 days:  PCP - Billey Gosling, MD Cardiologist -   Chest x-ray -  EKG -  Stress Test -  ECHO -  Cardiac Cath -  Pacemaker/ICD device last checked: Spinal Cord Stimulator:  Bowel Prep -   Sleep Study -  CPAP -   Fasting Blood Sugar -  Checks Blood Sugar _____ times a day  Last dose of GLP1 agonist-  N/A GLP1 instructions:  N/A   Last dose of SGLT-2 inhibitors-  N/A SGLT-2 instructions: N/A   Blood Thinner Instructions: Aspirin Instructions: Last Dose:  Activity level:  Can go up a flight of stairs and perform activities of daily living without stopping and without symptoms of chest pain or shortness of breath.  Able to exercise without symptoms  Unable to go up a flight of stairs without symptoms of     Anesthesia review:   Patient denies shortness of breath, fever, cough and chest pain at PAT appointment  Patient verbalized understanding of instructions that were given to them at the PAT appointment. Patient was also instructed that they will need to review over the PAT instructions again at home before surgery.

## 2022-11-11 NOTE — Patient Instructions (Signed)
SURGICAL WAITING ROOM VISITATION Patients having surgery or a procedure may have no more than 2 support people in the waiting area - these visitors may rotate.   Children under the age of 50 must have an adult with them who is not the patient. If the patient needs to stay at the hospital during part of their recovery, the visitor guidelines for inpatient rooms apply. Pre-op nurse will coordinate an appropriate time for 1 support person to accompany patient in pre-op.  This support person may not rotate.    Please refer to the [facility] website for the visitor guidelines for Inpatients (after your surgery is over and you are in a regular room).      Your procedure is scheduled on: 11-22-22   Report to [facility] Main Entrance    Report to admitting at 5:15 AM   Call this number if you have problems the morning of surgery [phone]   Do not eat food :After Midnight.   After Midnight you may have the following liquids until 4:15 AM DAY OF SURGERY  Water Non-Citrus Juices (without pulp, NO RED) Carbonated Beverages Black Coffee (NO MILK/CREAM OR CREAMERS, sugar ok)  Clear Tea (NO MILK/CREAM OR CREAMERS, sugar ok) regular and decaf                             Plain Jell-O (NO RED)                                           Fruit ices (not with fruit pulp, NO RED)                                     Popsicles (NO RED)                                                               Sports drinks like Gatorade (NO RED)                   The day of surgery:  Drink ONE (1) Pre-Surgery Clear Ensure at 4:15 AM the morning of surgery. Drink in one sitting. Do not sip.  This drink was given to you during your hospital  pre-op appointment visit. Nothing else to drink after completing the Pre-Surgery Clear Ensure .          If you have questions, please contact your surgeon's office.   FOLLOW  ANY ADDITIONAL PRE OP INSTRUCTIONS YOU RECEIVED FROM YOUR SURGEON'S OFFICE!!!      Oral Hygiene is also important to reduce your risk of infection.                                    Remember - BRUSH YOUR TEETH THE MORNING OF SURGERY WITH YOUR REGULAR TOOTHPASTE   Do NOT smoke after Midnight   Take these medicines the morning of surgery with A SIP OF WATER:   Synthroid  DO NOT TAKE ANY ORAL DIABETIC MEDICATIONS  DAY OF YOUR SURGERY  Bring CPAP mask and tubing day of surgery.                              You may not have any metal on your body including hair pins, jewelry, and body piercing             Do not wear make-up, lotions, powders, perfumes or deodorant  Do not wear nail polish including gel and S&S, artificial/acrylic nails, or any other type of covering on natural nails including finger and toenails. If you have artificial nails, gel coating, etc. that needs to be removed by a nail salon please have this removed prior to surgery or surgery may need to be canceled/ delayed if the surgeon/ anesthesia feels like they are unable to be safely monitored.   Do not shave  48 hours prior to surgery.           Do not bring valuables to the hospital. [place].   Contacts, dentures or bridgework may not be worn into surgery.   Bring small overnight bag day of surgery.   DO NOT [place]. PHARMACY WILL DISPENSE MEDICATIONS LISTED ON YOUR MEDICATION LIST TO YOU DURING YOUR ADMISSION [place]!    Special Instructions: Bring a copy of your healthcare power of attorney and living will documents the day of surgery if you haven't scanned them before.              Please read over the following fact sheets you were given: IF [place]  If you received a COVID test during your pre-op visit  it is requested that you wear a mask when out in public, stay away from anyone that may not be feeling well and notify your surgeon if you  develop symptoms. If you test positive for Covid or have been in contact with anyone that has tested positive in the last 10 days please notify you surgeon.  [place] - Preparing for Surgery Before surgery, you can play an important role.  Because skin is not sterile, your skin needs to be as free of germs as possible.  You can reduce the number of germs on your skin by washing with CHG (chlorahexidine gluconate) soap before surgery.  CHG is an antiseptic cleaner which kills germs and bonds with the skin to continue killing germs even after washing. Please DO NOT use if you have an allergy to CHG or antibacterial soaps.  If your skin becomes reddened/irritated stop using the CHG and inform your nurse when you arrive at Short Stay. Do not shave (including legs and underarms) for at least 48 hours prior to the first CHG shower.  You may shave your face/neck.  Please follow these instructions carefully:  1.  Shower with CHG Soap the night before surgery and the  morning of surgery.  2.  If you choose to wash your hair, wash your hair first as usual with your normal  shampoo.  3.  After you shampoo, rinse your hair and body thoroughly to remove the shampoo.                             4.  Use CHG as you would any other liquid soap.  You can  apply chg directly to the skin and wash.  Gently with a scrungie or clean washcloth.  5.  Apply the CHG Soap to your body ONLY FROM THE NECK DOWN.   Do   not use on face/ open                           Wound or open sores. Avoid contact with eyes, ears mouth and   genitals (private parts).                       Wash face,  Genitals (private parts) with your normal soap.             6.  Wash thoroughly, paying special attention to the area where your    surgery  will be performed.  7.  Thoroughly rinse your body with warm water from the neck down.  8.  DO NOT shower/wash with your normal soap after using and rinsing off the CHG Soap.                9.  Pat  yourself dry with a clean towel.            10.  Wear clean pajamas.            11.  Place clean sheets on your bed the night of your first shower and do not  sleep with pets. Day of Surgery : Do not apply any lotions/deodorants the morning of surgery.  Please wear clean clothes to the hospital/surgery center.  FAILURE TO FOLLOW THESE INSTRUCTIONS MAY RESULT IN THE CANCELLATION OF YOUR SURGERY  PATIENT SIGNATURE_________________________________  NURSE SIGNATURE__________________________________  ________________________________________________________________________    Joan [name]  An incentive spirometer is a tool that can help keep your lungs clear and active. This tool measures how well you are filling your lungs with each breath. Taking long deep breaths may help reverse or decrease the chance of developing breathing (pulmonary) problems (especially infection) following: A long period of time when you are unable to move or be active. BEFORE THE PROCEDURE  If the spirometer includes an indicator to show your best effort, your nurse or respiratory therapist will set it to a desired goal. If possible, sit up straight or lean slightly forward. Try not to slouch. Hold the incentive spirometer in an upright position. INSTRUCTIONS FOR USE  Sit on the edge of your bed if possible, or sit up as far as you can in bed or on a chair. Hold the incentive spirometer in an upright position. Breathe out normally. Place the mouthpiece in your mouth and seal your lips tightly around it. Breathe in slowly and as deeply as possible, raising the piston or the ball toward the top of the column. Hold your breath for 3-5 seconds or for as long as possible. Allow the piston or ball to fall to the bottom of the column. Remove the mouthpiece from your mouth and breathe out normally. Rest for a few seconds and repeat Steps 1 through 7 at least 10 times every 1-2 hours when you are awake. Take your  time and take a few normal breaths between deep breaths. The spirometer may include an indicator to show your best effort. Use the indicator as a goal to work toward during each repetition. After each set of 10 deep breaths, practice coughing to be sure your lungs are clear. If you have an incision (the cut made at  the time of surgery), support your incision when coughing by placing a pillow or rolled up towels firmly against it. Once you are able to get out of bed, walk around indoors and cough well. You may stop using the incentive spirometer when instructed by your caregiver.  RISKS AND COMPLICATIONS Take your time so you do not get dizzy or light-headed. If you are in pain, you may need to take or ask for pain medication before doing incentive spirometry. It is harder to take a deep breath if you are having pain. AFTER USE Rest and breathe slowly and easily. It can be helpful to keep track of a log of your progress. Your caregiver can provide you with a simple table to help with this. If you are using the spirometer at home, follow these instructions: [place] IF:  You are having difficultly using the spirometer. You have trouble using the spirometer as often as instructed. Your pain medication is not giving enough relief while using the spirometer. You develop fever of 100.5 F (38.1 C) or higher. SEEK IMMEDIATE MEDICAL CARE IF:  You cough up bloody sputum that had not been present before. You develop fever of 102 F (38.9 C) or greater. You develop worsening pain at or near the incision site. MAKE SURE YOU:  Understand these instructions. Will watch your condition. Will get help right away if you are not doing well or get worse. Document Released: 03/24/2007 Document Revised: 02/03/2012 Document Reviewed: 05/25/2007 [facility] Patient Information 2014 [place], [place].   ________________________________________________________________________

## 2022-11-12 NOTE — Progress Notes (Signed)
Surgery orders requested via Epic inbox. °

## 2022-11-13 ENCOUNTER — Encounter (HOSPITAL_COMMUNITY)
Admission: RE | Admit: 2022-11-13 | Discharge: 2022-11-13 | Disposition: A | Discharge status: Home / Self Care | Payer: Medicare Other | Source: Ambulatory Visit | Attending: Orthopaedic Surgery | Admitting: Orthopaedic Surgery

## 2022-11-13 ENCOUNTER — Other Ambulatory Visit: Payer: Self-pay | Admitting: Physician Assistant

## 2022-11-13 ENCOUNTER — Other Ambulatory Visit: Payer: Self-pay

## 2022-11-13 ENCOUNTER — Encounter (HOSPITAL_COMMUNITY): Payer: Self-pay

## 2022-11-13 VITALS — BP 156/93 | HR 75 | Temp 98.3°F | Resp 16 | Ht 64.0 in | Wt 196.0 lb

## 2022-11-13 DIAGNOSIS — K769 Liver disease, unspecified: Secondary | ICD-10-CM | POA: Insufficient documentation

## 2022-11-13 DIAGNOSIS — Z01818 Encounter for other preprocedural examination: Secondary | ICD-10-CM

## 2022-11-13 DIAGNOSIS — R9431 Abnormal electrocardiogram [ECG] [EKG]: Secondary | ICD-10-CM | POA: Diagnosis not present

## 2022-11-13 DIAGNOSIS — I2510 Atherosclerotic heart disease of native coronary artery without angina pectoris: Secondary | ICD-10-CM | POA: Insufficient documentation

## 2022-11-13 HISTORY — DX: Nausea with vomiting, unspecified: R11.2

## 2022-11-13 HISTORY — DX: Hypothyroidism, unspecified: E03.9

## 2022-11-13 HISTORY — DX: Nausea with vomiting, unspecified: Z98.890

## 2022-11-13 LAB — COMPREHENSIVE METABOLIC PANEL
ALT: 23 U/L (ref 0–44)
AST: 19 U/L (ref 15–41)
Albumin: 3.9 g/dL (ref 3.5–5.0)
Alkaline Phosphatase: 66 U/L (ref 38–126)
Anion gap: 10 (ref 5–15)
BUN: 13 mg/dL (ref 8–23)
CO2: 26 mmol/L (ref 22–32)
Calcium: 9.1 mg/dL (ref 8.9–10.3)
Chloride: 103 mmol/L (ref 98–111)
Creatinine, Ser: 0.66 mg/dL (ref 0.44–1.00)
GFR, Estimated: >60 mL/min (ref >60)
Glucose, Bld: 104 mg/dL — ABNORMAL HIGH (ref 70–99)
Potassium: 3.5 mmol/L (ref 3.5–5.1)
Sodium: 139 mmol/L (ref 135–145)
Total Bilirubin: 0.5 mg/dL (ref 0.3–1.2)
Total Protein: 6.8 g/dL (ref 6.5–8.1)

## 2022-11-13 LAB — CBC
HCT: 41.9 % (ref 36.0–46.0)
Hemoglobin: 14 g/dL (ref 12.0–15.0)
MCH: 30.9 pg (ref 26.0–34.0)
MCHC: 33.4 g/dL (ref 30.0–36.0)
MCV: 92.5 fL (ref 80.0–100.0)
Platelets: 206 10*3/uL (ref 150–400)
RBC: 4.53 MIL/uL (ref 3.87–5.11)
RDW: 13.3 % (ref 11.5–15.5)
WBC: 6.1 10*3/uL (ref 4.0–10.5)
nRBC: 0 % (ref 0.0–0.2)

## 2022-11-13 LAB — SURGICAL PCR SCREEN
MRSA, PCR: NEGATIVE
Staphylococcus aureus: NEGATIVE

## 2022-11-21 NOTE — Anesthesia Preprocedure Evaluation (Addendum)
Anesthesia Evaluation  Patient identified by MRN, date of birth, ID band Patient awake    Reviewed: Allergy & Precautions, NPO status , Patient's Chart, lab work & pertinent test results  History of Anesthesia Complications Negative for: history of anesthetic complications  Airway Mallampati: II  TM Distance: >3 FB Neck ROM: Full    Dental   Pulmonary neg pulmonary ROS   Pulmonary exam normal        Cardiovascular hypertension, Pt. on medications Normal cardiovascular exam     Neuro/Psych negative neurological ROS     GI/Hepatic negative GI ROS, Neg liver ROS,,,  Endo/Other  Hypothyroidism    Renal/GU negative Renal ROS  negative genitourinary   Musculoskeletal  (+) Arthritis , Osteoarthritis,    Abdominal   Peds  Hematology negative hematology ROS (+)   Anesthesia Other Findings   Reproductive/Obstetrics                             Anesthesia Physical Anesthesia Plan  ASA: 2  Anesthesia Plan: Spinal   Post-op Pain Management: Regional block*, Tylenol PO (pre-op)* and Toradol IV (intra-op)*   Induction:   PONV Risk Score and Plan: 2 and Propofol infusion, Treatment may vary due to age or medical condition, Ondansetron, TIVA and Midazolam  Airway Management Planned: Nasal Cannula and Simple Face Mask  Additional Equipment: None  Intra-op Plan:   Post-operative Plan:   Informed Consent: I have reviewed the patients History and Physical, chart, labs and discussed the procedure including the risks, benefits and alternatives for the proposed anesthesia with the patient or authorized representative who has indicated his/her understanding and acceptance.       Plan Discussed with:   Anesthesia Plan Comments:        Anesthesia Quick Evaluation

## 2022-11-22 ENCOUNTER — Ambulatory Visit (HOSPITAL_COMMUNITY): Payer: Medicare Other | Admitting: Physician Assistant

## 2022-11-22 ENCOUNTER — Encounter (HOSPITAL_COMMUNITY): Payer: Self-pay | Admitting: Orthopaedic Surgery

## 2022-11-22 ENCOUNTER — Other Ambulatory Visit: Payer: Self-pay

## 2022-11-22 ENCOUNTER — Ambulatory Visit (HOSPITAL_COMMUNITY): Payer: Medicare Other | Admitting: Anesthesiology

## 2022-11-22 ENCOUNTER — Encounter (HOSPITAL_COMMUNITY)
Admission: RE | Disposition: A | Discharge status: Home Health | Payer: Self-pay | Source: Home / Self Care | Attending: Orthopaedic Surgery

## 2022-11-22 ENCOUNTER — Observation Stay (HOSPITAL_COMMUNITY)
Admission: RE | Admit: 2022-11-22 | Discharge: 2022-11-24 | Disposition: A | Discharge status: Home Health | Payer: Medicare Other | Attending: Orthopaedic Surgery | Admitting: Orthopaedic Surgery

## 2022-11-22 ENCOUNTER — Observation Stay (HOSPITAL_COMMUNITY): Payer: Medicare Other

## 2022-11-22 DIAGNOSIS — E039 Hypothyroidism, unspecified: Secondary | ICD-10-CM | POA: Insufficient documentation

## 2022-11-22 DIAGNOSIS — Z471 Aftercare following joint replacement surgery: Secondary | ICD-10-CM | POA: Diagnosis not present

## 2022-11-22 DIAGNOSIS — M1712 Unilateral primary osteoarthritis, left knee: Secondary | ICD-10-CM | POA: Diagnosis not present

## 2022-11-22 DIAGNOSIS — Z96652 Presence of left artificial knee joint: Secondary | ICD-10-CM

## 2022-11-22 DIAGNOSIS — I10 Essential (primary) hypertension: Secondary | ICD-10-CM | POA: Diagnosis not present

## 2022-11-22 DIAGNOSIS — Z01818 Encounter for other preprocedural examination: Secondary | ICD-10-CM

## 2022-11-22 DIAGNOSIS — G8918 Other acute postprocedural pain: Secondary | ICD-10-CM | POA: Diagnosis not present

## 2022-11-22 DIAGNOSIS — K769 Liver disease, unspecified: Secondary | ICD-10-CM

## 2022-11-22 DIAGNOSIS — Z79899 Other long term (current) drug therapy: Secondary | ICD-10-CM | POA: Insufficient documentation

## 2022-11-22 HISTORY — PX: TOTAL KNEE ARTHROPLASTY: SHX125

## 2022-11-22 LAB — TYPE AND SCREEN
ABO/RH(D): O POS
Antibody Screen: NEGATIVE

## 2022-11-22 LAB — ABO/RH: ABO/RH(D): O POS

## 2022-11-22 SURGERY — ARTHROPLASTY, KNEE, TOTAL
Anesthesia: Spinal | Site: Knee | Laterality: Left

## 2022-11-22 MED ORDER — FENTANYL CITRATE PF 50 MCG/ML IJ SOSY
PREFILLED_SYRINGE | INTRAMUSCULAR | Status: AC
  Filled 2022-11-22: qty 1

## 2022-11-22 MED ORDER — METHOCARBAMOL 500 MG IVPB - SIMPLE MED
INTRAVENOUS | Status: AC
  Filled 2022-11-22: qty 55

## 2022-11-22 MED ORDER — AMISULPRIDE (ANTIEMETIC) 5 MG/2ML IV SOLN: 10.0000 mg | Freq: Once | INTRAVENOUS | Status: DC | PRN

## 2022-11-22 MED ORDER — MIDAZOLAM HCL 5 MG/5ML IJ SOLN
INTRAMUSCULAR | Status: DC | PRN
  Administered 2022-11-22 (×2): 1 mg via INTRAVENOUS

## 2022-11-22 MED ORDER — METOCLOPRAMIDE HCL 5 MG PO TABS: 5.0000 mg | ORAL_TABLET | Freq: Three times a day (TID) | ORAL | Status: DC | PRN

## 2022-11-22 MED ORDER — MIDAZOLAM HCL 2 MG/2ML IJ SOLN
INTRAMUSCULAR | Status: AC
  Filled 2022-11-22: qty 2

## 2022-11-22 MED ORDER — HYDROMORPHONE HCL 2 MG PO TABS
2.0000 mg | ORAL_TABLET | ORAL | Status: DC | PRN
  Administered 2022-11-23 – 2022-11-24 (×3): 2 mg via ORAL
  Filled 2022-11-22 (×3): qty 1

## 2022-11-22 MED ORDER — TRANEXAMIC ACID-NACL 1000-0.7 MG/100ML-% IV SOLN
1000.0000 mg | INTRAVENOUS | Status: AC
  Administered 2022-11-22: 1000 mg via INTRAVENOUS
  Filled 2022-11-22: qty 100

## 2022-11-22 MED ORDER — SCOPOLAMINE 1 MG/3DAYS TD PT72
MEDICATED_PATCH | TRANSDERMAL | Status: AC
  Filled 2022-11-22: qty 1

## 2022-11-22 MED ORDER — 0.9 % SODIUM CHLORIDE (POUR BTL) OPTIME
TOPICAL | Status: DC | PRN
  Administered 2022-11-22: 1000 mL

## 2022-11-22 MED ORDER — DOCUSATE SODIUM 100 MG PO CAPS
100.0000 mg | ORAL_CAPSULE | Freq: Two times a day (BID) | ORAL | Status: DC
  Administered 2022-11-22 – 2022-11-24 (×5): 100 mg via ORAL
  Filled 2022-11-22 (×5): qty 1

## 2022-11-22 MED ORDER — ROPIVACAINE HCL 5 MG/ML IJ SOLN
INTRAMUSCULAR | Status: DC | PRN
  Administered 2022-11-22: 30 mL via PERINEURAL

## 2022-11-22 MED ORDER — EPHEDRINE SULFATE-NACL 50-0.9 MG/10ML-% IV SOSY
PREFILLED_SYRINGE | INTRAVENOUS | Status: DC | PRN
  Administered 2022-11-22: 10 mg via INTRAVENOUS

## 2022-11-22 MED ORDER — DIPHENHYDRAMINE HCL 12.5 MG/5ML PO ELIX
12.5000 mg | ORAL_SOLUTION | ORAL | Status: DC | PRN
  Administered 2022-11-22 – 2022-11-24 (×5): 25 mg via ORAL
  Filled 2022-11-22 (×5): qty 10

## 2022-11-22 MED ORDER — BENAZEPRIL HCL 20 MG PO TABS
20.0000 mg | ORAL_TABLET | Freq: Every day | ORAL | Status: DC
  Administered 2022-11-22: 20 mg via ORAL
  Filled 2022-11-22 (×2): qty 1

## 2022-11-22 MED ORDER — ONDANSETRON HCL 4 MG/2ML IJ SOLN: 4.0000 mg | Freq: Once | INTRAMUSCULAR | Status: DC | PRN

## 2022-11-22 MED ORDER — AMLODIPINE BESYLATE 5 MG PO TABS
5.0000 mg | ORAL_TABLET | Freq: Every day | ORAL | Status: DC
  Administered 2022-11-22: 5 mg via ORAL
  Filled 2022-11-22 (×2): qty 1

## 2022-11-22 MED ORDER — PHENOL 1.4 % MT LIQD: 1.0000 | OROMUCOSAL | Status: DC | PRN

## 2022-11-22 MED ORDER — MENTHOL 3 MG MT LOZG: 1.0000 | LOZENGE | OROMUCOSAL | Status: DC | PRN

## 2022-11-22 MED ORDER — OXYCODONE HCL 5 MG PO TABS
ORAL_TABLET | ORAL | Status: AC
  Filled 2022-11-22: qty 1

## 2022-11-22 MED ORDER — ASPIRIN 81 MG PO CHEW
81.0000 mg | CHEWABLE_TABLET | Freq: Two times a day (BID) | ORAL | Status: DC
  Administered 2022-11-22 – 2022-11-24 (×4): 81 mg via ORAL
  Filled 2022-11-22 (×4): qty 1

## 2022-11-22 MED ORDER — KETOROLAC TROMETHAMINE 15 MG/ML IJ SOLN
7.5000 mg | Freq: Four times a day (QID) | INTRAMUSCULAR | Status: AC
  Administered 2022-11-22 – 2022-11-23 (×3): 7.5 mg via INTRAVENOUS
  Filled 2022-11-22 (×3): qty 1

## 2022-11-22 MED ORDER — DEXAMETHASONE SODIUM PHOSPHATE 10 MG/ML IJ SOLN
INTRAMUSCULAR | Status: DC | PRN
  Administered 2022-11-22: 10 mg via INTRAVENOUS

## 2022-11-22 MED ORDER — FENTANYL CITRATE PF 50 MCG/ML IJ SOSY
25.0000 ug | PREFILLED_SYRINGE | INTRAMUSCULAR | Status: DC | PRN
  Administered 2022-11-22 (×3): 50 ug via INTRAVENOUS

## 2022-11-22 MED ORDER — ONDANSETRON HCL 4 MG/2ML IJ SOLN: 4.0000 mg | Freq: Four times a day (QID) | INTRAMUSCULAR | Status: DC | PRN

## 2022-11-22 MED ORDER — ONDANSETRON HCL 4 MG PO TABS: 4.0000 mg | ORAL_TABLET | Freq: Four times a day (QID) | ORAL | Status: DC | PRN

## 2022-11-22 MED ORDER — TRIAMTERENE-HCTZ 37.5-25 MG PO CAPS
1.0000 | ORAL_CAPSULE | Freq: Every day | ORAL | Status: DC
  Administered 2022-11-22: 1 via ORAL
  Filled 2022-11-22 (×5): qty 1

## 2022-11-22 MED ORDER — STERILE WATER FOR IRRIGATION IR SOLN
Status: DC | PRN
  Administered 2022-11-22 (×2): 1000 mL

## 2022-11-22 MED ORDER — ORAL CARE MOUTH RINSE: 15.0000 mL | Freq: Once | OROMUCOSAL | Status: AC

## 2022-11-22 MED ORDER — OXYCODONE HCL 5 MG PO TABS
5.0000 mg | ORAL_TABLET | ORAL | Status: DC | PRN
  Administered 2022-11-22 – 2022-11-24 (×10): 10 mg via ORAL
  Filled 2022-11-22 (×10): qty 2

## 2022-11-22 MED ORDER — METHOCARBAMOL 500 MG PO TABS
500.0000 mg | ORAL_TABLET | Freq: Four times a day (QID) | ORAL | Status: DC | PRN
  Administered 2022-11-22 – 2022-11-24 (×5): 500 mg via ORAL
  Filled 2022-11-22 (×5): qty 1

## 2022-11-22 MED ORDER — CEFAZOLIN SODIUM-DEXTROSE 1-4 GM/50ML-% IV SOLN
1.0000 g | Freq: Four times a day (QID) | INTRAVENOUS | Status: AC
  Administered 2022-11-22 (×2): 1 g via INTRAVENOUS
  Filled 2022-11-22 (×2): qty 50

## 2022-11-22 MED ORDER — PROPOFOL 500 MG/50ML IV EMUL
INTRAVENOUS | Status: DC | PRN
  Administered 2022-11-22: 30 ug/kg/min via INTRAVENOUS

## 2022-11-22 MED ORDER — METHOCARBAMOL 500 MG IVPB - SIMPLE MED
500.0000 mg | Freq: Four times a day (QID) | INTRAVENOUS | Status: DC | PRN
  Administered 2022-11-22: 500 mg via INTRAVENOUS

## 2022-11-22 MED ORDER — SODIUM CHLORIDE 0.9 % IV SOLN: INTRAVENOUS | Status: DC

## 2022-11-22 MED ORDER — PANTOPRAZOLE SODIUM 40 MG PO TBEC
40.0000 mg | DELAYED_RELEASE_TABLET | Freq: Every day | ORAL | Status: DC
  Administered 2022-11-22 – 2022-11-24 (×3): 40 mg via ORAL
  Filled 2022-11-22 (×3): qty 1

## 2022-11-22 MED ORDER — CEFAZOLIN SODIUM-DEXTROSE 2-4 GM/100ML-% IV SOLN
2.0000 g | INTRAVENOUS | Status: AC
  Administered 2022-11-22: 2 g via INTRAVENOUS
  Filled 2022-11-22: qty 100

## 2022-11-22 MED ORDER — ONDANSETRON HCL 4 MG/2ML IJ SOLN
INTRAMUSCULAR | Status: AC
  Filled 2022-11-22: qty 2

## 2022-11-22 MED ORDER — HYDROMORPHONE HCL 1 MG/ML IJ SOLN
0.5000 mg | INTRAMUSCULAR | Status: DC | PRN
  Administered 2022-11-22 – 2022-11-23 (×3): 1 mg via INTRAVENOUS
  Filled 2022-11-22 (×3): qty 1

## 2022-11-22 MED ORDER — FENTANYL CITRATE (PF) 100 MCG/2ML IJ SOLN
INTRAMUSCULAR | Status: AC
  Filled 2022-11-22: qty 2

## 2022-11-22 MED ORDER — SCOPOLAMINE 1 MG/3DAYS TD PT72
MEDICATED_PATCH | TRANSDERMAL | Status: DC | PRN
  Administered 2022-11-22: 1 via TRANSDERMAL

## 2022-11-22 MED ORDER — PROPOFOL 10 MG/ML IV BOLUS
INTRAVENOUS | Status: DC | PRN
  Administered 2022-11-22: 30 mg via INTRAVENOUS

## 2022-11-22 MED ORDER — ALUM & MAG HYDROXIDE-SIMETH 200-200-20 MG/5ML PO SUSP: 30.0000 mL | ORAL | Status: DC | PRN

## 2022-11-22 MED ORDER — DEXAMETHASONE SODIUM PHOSPHATE 10 MG/ML IJ SOLN
INTRAMUSCULAR | Status: AC
  Filled 2022-11-22: qty 1

## 2022-11-22 MED ORDER — FENTANYL CITRATE PF 50 MCG/ML IJ SOSY
PREFILLED_SYRINGE | INTRAMUSCULAR | Status: AC
  Filled 2022-11-22: qty 2

## 2022-11-22 MED ORDER — ORAL CARE MOUTH RINSE: 15.0000 mL | OROMUCOSAL | Status: DC | PRN

## 2022-11-22 MED ORDER — ACETAMINOPHEN 325 MG PO TABS
325.0000 mg | ORAL_TABLET | Freq: Four times a day (QID) | ORAL | Status: DC | PRN
  Administered 2022-11-24: 650 mg via ORAL
  Filled 2022-11-22: qty 2

## 2022-11-22 MED ORDER — LEVOTHYROXINE SODIUM 100 MCG PO TABS
100.0000 ug | ORAL_TABLET | Freq: Every day | ORAL | Status: DC
  Administered 2022-11-23 – 2022-11-24 (×2): 100 ug via ORAL
  Filled 2022-11-22 (×2): qty 1

## 2022-11-22 MED ORDER — SODIUM CHLORIDE 0.9 % IR SOLN
Status: DC | PRN
  Administered 2022-11-22: 1000 mL

## 2022-11-22 MED ORDER — OXYCODONE HCL 5 MG/5ML PO SOLN: 5.0000 mg | Freq: Once | ORAL | Status: AC | PRN

## 2022-11-22 MED ORDER — METOCLOPRAMIDE HCL 5 MG/ML IJ SOLN: 5.0000 mg | Freq: Three times a day (TID) | INTRAMUSCULAR | Status: DC | PRN

## 2022-11-22 MED ORDER — FENTANYL CITRATE (PF) 100 MCG/2ML IJ SOLN
INTRAMUSCULAR | Status: DC | PRN
  Administered 2022-11-22 (×2): 50 ug via INTRAVENOUS

## 2022-11-22 MED ORDER — ACETAMINOPHEN 500 MG PO TABS
1000.0000 mg | ORAL_TABLET | Freq: Once | ORAL | Status: AC
  Administered 2022-11-22: 1000 mg via ORAL
  Filled 2022-11-22: qty 2

## 2022-11-22 MED ORDER — LACTATED RINGERS IV SOLN: INTRAVENOUS | Status: DC

## 2022-11-22 MED ORDER — ONDANSETRON HCL 4 MG/2ML IJ SOLN
INTRAMUSCULAR | Status: DC | PRN
  Administered 2022-11-22: 4 mg via INTRAVENOUS

## 2022-11-22 MED ORDER — POVIDONE-IODINE 10 % EX SWAB
2.0000 | Freq: Once | CUTANEOUS | Status: AC
  Administered 2022-11-22: 2 via TOPICAL

## 2022-11-22 MED ORDER — OXYCODONE HCL 5 MG PO TABS
5.0000 mg | ORAL_TABLET | Freq: Once | ORAL | Status: AC | PRN
  Administered 2022-11-22: 5 mg via ORAL

## 2022-11-22 MED ORDER — PROPOFOL 1000 MG/100ML IV EMUL
INTRAVENOUS | Status: AC
  Filled 2022-11-22: qty 100

## 2022-11-22 MED ORDER — BUPIVACAINE IN DEXTROSE 0.75-8.25 % IT SOLN
INTRATHECAL | Status: DC | PRN
  Administered 2022-11-22: 1.6 mL via INTRATHECAL

## 2022-11-22 MED ORDER — CHLORHEXIDINE GLUCONATE 0.12 % MT SOLN
15.0000 mL | Freq: Once | OROMUCOSAL | Status: AC
  Administered 2022-11-22: 15 mL via OROMUCOSAL

## 2022-11-22 SURGICAL SUPPLY — 64 items
APL SKNCLS STERI-STRIP NONHPOA (GAUZE/BANDAGES/DRESSINGS)
BAG COUNTER SPONGE SURGICOUNT (BAG) IMPLANT
BAG SPEC THK2 15X12 ZIP CLS (MISCELLANEOUS) ×1
BAG SPNG CNTER NS LX DISP (BAG)
BAG ZIPLOCK 12X15 (MISCELLANEOUS) ×2 IMPLANT
BENZOIN TINCTURE PRP APPL 2/3 (GAUZE/BANDAGES/DRESSINGS) IMPLANT
BLADE SAG 18X100X1.27 (BLADE) ×2 IMPLANT
BLADE SURG SZ10 CARB STEEL (BLADE) ×4 IMPLANT
BNDG CMPR MED 10X6 ELC LF (GAUZE/BANDAGES/DRESSINGS) ×1
BNDG ELASTIC 6X10 VLCR STRL LF (GAUZE/BANDAGES/DRESSINGS) IMPLANT
BNDG ELASTIC 6X5.8 VLCR STR LF (GAUZE/BANDAGES/DRESSINGS) ×4 IMPLANT
BOWL SMART MIX CTS (DISPOSABLE) IMPLANT
CEMENT BONE SIMPLEX SPEEDSET (Cement) IMPLANT
COMP FEM KNEE PS STD 6 LT (Joint) ×1 IMPLANT
COMP MED POLY AS PERS S6-7 12 (Joint) ×1 IMPLANT
COMPONENT FEM KNEE PS STD 6 LT (Joint) IMPLANT
COMPONENT MED PLY PERSS6-7 12 (Joint) IMPLANT
COOLER ICEMAN CLASSIC (MISCELLANEOUS) ×2 IMPLANT
COVER SURGICAL LIGHT HANDLE (MISCELLANEOUS) ×2 IMPLANT
CUFF TOURN SGL QUICK 34 (TOURNIQUET CUFF) ×1
CUFF TRNQT CYL 34X4.125X (TOURNIQUET CUFF) ×2 IMPLANT
DRAPE INCISE IOBAN 66X45 STRL (DRAPES) ×2 IMPLANT
DRAPE U-SHAPE 47X51 STRL (DRAPES) ×2 IMPLANT
DURAPREP 26ML APPLICATOR (WOUND CARE) ×2 IMPLANT
ELECT BLADE TIP CTD 4 INCH (ELECTRODE) ×2 IMPLANT
ELECT REM PT RETURN 15FT ADLT (MISCELLANEOUS) ×2 IMPLANT
GAUZE PAD ABD 8X10 STRL (GAUZE/BANDAGES/DRESSINGS) ×4 IMPLANT
GAUZE SPONGE 4X4 12PLY STRL (GAUZE/BANDAGES/DRESSINGS) ×2 IMPLANT
GAUZE XEROFORM 1X8 LF (GAUZE/BANDAGES/DRESSINGS) IMPLANT
GLOVE BIO SURGEON STRL SZ7.5 (GLOVE) ×2 IMPLANT
GLOVE BIOGEL PI IND STRL 8 (GLOVE) ×4 IMPLANT
GLOVE ECLIPSE 8.0 STRL XLNG CF (GLOVE) ×2 IMPLANT
GOWN STRL REUS W/ TWL XL LVL3 (GOWN DISPOSABLE) ×4 IMPLANT
GOWN STRL REUS W/TWL XL LVL3 (GOWN DISPOSABLE) ×2
HANDPIECE INTERPULSE COAX TIP (DISPOSABLE) ×1
HDLS TROCR DRIL PIN KNEE 75 (PIN) ×1
HOLDER FOLEY CATH W/STRAP (MISCELLANEOUS) IMPLANT
IMMOBILIZER KNEE 20 (SOFTGOODS) ×1
IMMOBILIZER KNEE 20 THIGH 36 (SOFTGOODS) ×2 IMPLANT
IMPL PATELLA METAL SZ32X10 (Joint) IMPLANT
KIT TURNOVER KIT A (KITS) IMPLANT
NS IRRIG 1000ML POUR BTL (IV SOLUTION) ×2 IMPLANT
PACK TOTAL KNEE CUSTOM (KITS) ×2 IMPLANT
PAD COLD SHLDR WRAP-ON (PAD) ×2 IMPLANT
PADDING CAST ABS COTTON 6X4 NS (CAST SUPPLIES) IMPLANT
PADDING CAST COTTON 6X4 STRL (CAST SUPPLIES) ×4 IMPLANT
PIN DRILL HDLS TROCAR 75 4PK (PIN) IMPLANT
PROTECTOR NERVE ULNAR (MISCELLANEOUS) ×2 IMPLANT
SCREW FEMALE HEX FIX 25X2.5 (ORTHOPEDIC DISPOSABLE SUPPLIES) IMPLANT
SCREW HEADED 33MM KNEE (MISCELLANEOUS) IMPLANT
SET HNDPC FAN SPRY TIP SCT (DISPOSABLE) ×2 IMPLANT
SET PAD KNEE POSITIONER (MISCELLANEOUS) ×2 IMPLANT
SPIKE FLUID TRANSFER (MISCELLANEOUS) IMPLANT
STAPLER VISISTAT 35W (STAPLE) IMPLANT
STEM TIB PS KNEE D 0D LT (Stem) IMPLANT
STRIP CLOSURE SKIN 1/2X4 (GAUZE/BANDAGES/DRESSINGS) IMPLANT
SUT MNCRL AB 4-0 PS2 18 (SUTURE) IMPLANT
SUT VIC AB 0 CT1 27 (SUTURE) ×1
SUT VIC AB 0 CT1 27XBRD ANTBC (SUTURE) ×2 IMPLANT
SUT VIC AB 1 CT1 36 (SUTURE) ×4 IMPLANT
SUT VIC AB 2-0 CT1 27 (SUTURE) ×2
SUT VIC AB 2-0 CT1 TAPERPNT 27 (SUTURE) ×4 IMPLANT
TRAY FOLEY MTR SLVR 16FR STAT (SET/KITS/TRAYS/PACK) IMPLANT
WATER STERILE IRR 1000ML POUR (IV SOLUTION) ×4 IMPLANT

## 2022-11-22 NOTE — Transfer of Care (Signed)
Immediate Anesthesia Transfer of Care Note  Patient: Joan [name]  Procedure(s) Performed: LEFT TOTAL KNEE ARTHROPLASTY (Left: Knee)  Patient Location: PACU  Anesthesia Type:Regional and Spinal  Level of Consciousness: awake, alert , and oriented  Airway & Oxygen Therapy: Patient Spontanous Breathing and Patient connected to face mask oxygen  Post-op Assessment: Report given to RN and Post -op Vital signs reviewed and stable  Post vital signs: Reviewed and stable  Last Vitals:  Vitals Value Taken Time  BP    Temp    Pulse 69 11/22/22 0857  Resp 15 11/22/22 0857  SpO2 97 % 11/22/22 0857  Vitals shown include unvalidated device data.  Last Pain:  Vitals:   11/22/22 0557  TempSrc: Oral  PainSc:          Complications: No notable events documented.

## 2022-11-22 NOTE — Op Note (Signed)
Operative Note  Date of operation: 11/22/2022 Preoperative diagnosis: Left knee primary osteoarthritis Postoperative diagnosis: Same  Procedure: Left press-fit total knee arthroplasty  Implants: Biomet/Zimmer persona press-fit knee system with size 6 standard CR femur, size D left tibial tray, 12 mm thickness medial congruent left polythene insert, 32 mm patella button  Surgeon: Lind Guest. Ninfa Linden, MD Assistant: Benita Stabile, PA-C  Anesthesia: #1 left lower extremity adductor canal block, #2 spinal Tourniquet time: Under 1 hour EBL: Less than 100 cc Antibiotics: 2 g IV Ancef Complications: None  Indications: The patient is a very pleasant 66 year old female with well-documented end-stage arthritis involving her left knee.  She has tried and failed all forms of conservative treatment including knee arthroscopy.  This is failed.  Her left knee pain is daily and it is detrimentally affecting her mobility, her quality of life and her actives day living.  Her x-rays also showed severe tricompartment arthritis of the left knee.  At this point she does wish to proceed with a left total knee arthroplasty and we agree with this as well.  We talked about the risk of acute blood loss anemia, nerve vessel injury, fracture, infection, DVT, implant failure, and wound healing issues.  We talked about the goals being decreased pain, improve mobility, and improved quality of life with time.  Procedure description: After informed consent was obtained and the appropriate left knee was marked, an adductor canal block was obtained by anesthesia in the holding room and the patient was brought to the operating room and set up on the operating table where spinal anesthesia was obtained.  She was then laid in the supine position on the operating table and a Foley catheter was placed.  A nonsterile tourniquet is placed around her upper left thigh and her left thigh, knee, leg, ankle and foot were prepped and draped  with DuraPrep and sterile drapes.  A timeout was called and she was notified is correct patient the correct left knee.  An Esmarch was used to wrap out the leg and the tourniquet was plated to 300 mm of pressure.  With the knee extended a direct midline incision was made over the patella and carried proximally distally.  Dissection was carried down to the knee joint and a medial parapatellar arthrotomy was carried out following a large joint effusion.  With the knee in a flexed position we remove remnants of the ACL as well as medial lateral meniscus and osteophytes from all 3 compartments.  There was complete cartilage wear in the knee especially the medial compartment the knee.  We then made our proximal tibia cut using the extramedullary cutting guide selecting this for a left knee at 3 degrees slope correction varus and valgus and taking 2 mm off the low side.  We made this cut without difficulty.  We then went to the femur and used a intramedullary cutting guide for making her distal femoral cut setting this for a 10 mm distal femoral cut at 5 degrees externally rotated for left knee.  We made this cut without difficulty and brought the knee back down to full extension and achieve full extension with a 10 mm extension block.  Attention was then turned back to the femur where we placed a femoral sizing guide based off the epicondylar axis at 3 degrees.  Based off of this we chose a size 6 femur.  A 4-in-1 cutting block for size 6 femur was placed and we made her anterior posterior cuts followed our chamfer cuts.  She was then turned back to the tibia.  We chose a size D left tibial tray for coverage over the tibial plateau setting the rotation of the tibial tubercle and the femur.  We did our drill hole and keel punch off of this.  Finding very good quality bone we chose press-fit implants.  We then trialed our size D left tibial tray followed by our size 6 left femur.  We placed a 10 mm left medial congruent  fixed-bearing polythene insert and trialed up to a 12 mm insert we are pleased with range of motion and stability with the trial insert of the 12 mm thickness.  We then made a patella cut and drilled a single hole for press-fit size 32 patella button.  All instrumentation was then removed from the knee and we irrigated the knee with normal saline solution.  Then with the knee in a flexed position we placed the real Biomet Zimmer persona press-fit tibial tray for left knee size D followed by our size 6 left CR standard press-fit femur.  We placed a 12 mm medial congruent left fixed-bearing polythene insert and press-fit our size 32 patella button.  We then put the knee through several cycles of motion and we are pleased with range of motion and stability.  The tourniquet was let down and hemostasis was obtained electrocautery.  We closed the arthrotomy with interrupted #1 Vicryl suture followed by 0 Vicryl close deep tissue and 2-0 Vicryl close subcutaneous tissue.  The skin was closed with staples.  Well-padded sterile dressings applied.  She was taken the recovery room in stable condition with all final counts being correct and no complications noted.  Of note Benita Stabile, PA-C did assisted in entire case from beginning to end and his assistance was medically necessary and crucial for soft tissue retraction and management as well as helping guide implant placement and a layered closure of the wound.

## 2022-11-22 NOTE — H&P (Signed)
TOTAL KNEE ADMISSION H&P  Patient is being admitted for left total knee arthroplasty.  Subjective:  Chief Complaint:left knee pain.  HPI: Joan [name], 66 y.o. female, has a history of pain and functional disability in the left knee due to arthritis and has failed non-surgical conservative treatments for greater than 12 weeks to includeNSAID's and/or analgesics, corticosteriod injections, viscosupplementation injections, flexibility and strengthening excercises, use of assistive devices, weight reduction as appropriate, and activity modification.  Onset of symptoms was gradual, starting 3 years ago with gradually worsening course since that time. The patient noted knee arthroscopy on the left knee(s).  Patient currently rates pain in the left knee(s) at 10 out of 10 with activity. Patient has night pain, worsening of pain with activity and weight bearing, pain that interferes with activities of daily living, pain with passive range of motion, crepitus, and joint swelling.  Patient has evidence of subchondral sclerosis, periarticular osteophytes, and joint space narrowing by imaging studies. There is no active infection.  Patient Active Problem List   Diagnosis Date Noted   Unilateral primary osteoarthritis, left knee 09/25/2022   Degenerative arthritis of left knee 08/20/2021   Degenerative spondylolisthesis 03/31/2020   Lumbar radiculopathy 03/28/2020   Mid back pain on right side 09/28/2018   Osteopenia 08/19/2018   Hyperglycemia 07/09/2018   Hypothyroidism 06/30/2017   Gallbladder polyp 05/20/2016   Fatty liver 05/20/2016   Obesity 05/01/2016   Vitamin D deficiency 07/14/2013   LOW BACK PAIN SYNDROME 01/23/2010   Status post radioactive iodine thyroid ablation 08/17/2009   Hyperlipidemia 07/26/2008   Essential hypertension 07/26/2008   Pain in joint of left shoulder 07/26/2008   Goiter 07/14/2007   Past Medical History:  Diagnosis Date   Allergy    Anemia    due to  Dysmenorrhea   Arthritis    generalized    Hypertension    initially after BCP initiated    Hypothyroidism    PONV (postoperative nausea and vomiting)    Thyroid disease 2005 & 2009   nodule    Past Surgical History:  Procedure Laterality Date   ABDOMINAL HYSTERECTOMY  1998   for heavy menses and fibroids   BIOPSY THYROID  2005,2009   both negative   CESAREAN SECTION     x2   COLONOSCOPY  2009    Dr Annet[facility]   EXPLORATORY LAPAROTOMY  1985   Ovarian cyst   Terry    LLQ pain due to ovarian cyst   SIGMOIDOSCOPY     TONSILLECTOMY      Current Facility-Administered Medications  Medication Dose Route Frequency Provider Last Rate Last Admin   ceFAZolin (ANCEF) IVPB 2g/100 mL premix  2 g Intravenous On Call to OR Pete Pelt, PA-C       lactated ringers infusion   Intravenous Continuous Barnet Glasgow, MD 10 mL/hr at 11/22/22 4163 Continued from Pre-op at 11/22/22 0624   scopolamine (TRANSDERM-SCOP) 1 MG/3DAYS            tranexamic acid (CYKLOKAPRON) IVPB 1,000 mg  1,000 mg Intravenous To OR Pete Pelt, PA-C       Facility-Administered Medications Ordered in Other Encounters  Medication Dose Route Frequency Provider Last Rate Last Admin   fentaNYL (SUBLIMAZE) injection   Intravenous Anesthesia Intra-op Williford, Peggy D, CRNA   50 mcg at 11/22/22 0654   midazolam (VERSED) 5 MG/5ML injection   Intravenous Anesthesia Intra-op Williford, Peggy D, CRNA   1 mg at 11/22/22 [phone]  Allergies  Allergen Reactions   Sulfonamide Derivatives Rash    Medi Alert bracelet  is recommended     Codeine Nausea Only   Tramadol Nausea And Vomiting    Social History   Tobacco Use   Smoking status: Never   Smokeless tobacco: Never  Substance Use Topics   Alcohol use: No    Family History  Problem Relation Age of Onset   Hypertension Father    Kidney failure Father        Bright's Disease; died @ 40   Hypertension Mother    Hyperlipidemia  Mother    Osteoarthritis Mother    Hypertension Brother    Diabetes Brother    Stroke Maternal Grandmother        in 52s   Hypertension Maternal Aunt        also DJD/ OA   Breast cancer Maternal Aunt    Heart attack Maternal Grandfather 69       also  CVA   Heart block Paternal Grandmother        pacemaker   Colon cancer Neg Hx    Colon polyps Neg Hx    Esophageal cancer Neg Hx    Rectal cancer Neg Hx    Stomach cancer Neg Hx      Review of Systems  All other systems reviewed and are negative.   Objective:  Physical Exam Vitals reviewed.  Constitutional:      Appearance: Normal appearance.  HENT:     Head: Normocephalic and atraumatic.  Eyes:     Extraocular Movements: Extraocular movements intact.     Pupils: Pupils are equal, round, and reactive to light.  Cardiovascular:     Rate and Rhythm: Normal rate and regular rhythm.  Pulmonary:     Effort: Pulmonary effort is normal.     Breath sounds: Normal breath sounds.  Abdominal:     Palpations: Abdomen is soft.  Musculoskeletal:     Cervical back: Normal range of motion and neck supple.     Left knee: Effusion, bony tenderness and crepitus present. Decreased range of motion. Tenderness present over the medial joint line and lateral joint line. Abnormal alignment and abnormal meniscus.  Neurological:     Mental Status: She is alert and oriented to person, place, and time.  Psychiatric:        Behavior: Behavior normal.     Vital signs in last 24 hours: Temp:  [97.9 F (36.6 C)] 97.9 F (36.6 C) (12/29 0557) Pulse Rate:  [86] 86 (12/29 0557) Resp:  [16] 16 (12/29 0557) BP: (139)/(92) 139/92 (12/29 0557) SpO2:  [95 %] 95 % (12/29 0557) Weight:  [88.9 kg] 88.9 kg (12/29 0545)  Labs:   Estimated body mass index is 33.64 kg/m as calculated from the following:   Height as of this encounter: '5\' 4"'$  (1.626 m).   Weight as of this encounter: 88.9 kg.   Imaging Review Plain radiographs demonstrate severe  degenerative joint disease of the left knee(s). The overall alignment ismild varus. The bone quality appears to be good for age and reported activity level.      Assessment/Plan:  End stage arthritis, left knee   The patient history, physical examination, clinical judgment of the provider and imaging studies are consistent with end stage degenerative joint disease of the left knee(s) and total knee arthroplasty is deemed medically necessary. The treatment options including medical management, injection therapy arthroscopy and arthroplasty were discussed at length. The risks and benefits of  total knee arthroplasty were presented and reviewed. The risks due to aseptic loosening, infection, stiffness, patella tracking problems, thromboembolic complications and other imponderables were discussed. The patient acknowledged the explanation, agreed to proceed with the plan and consent was signed. Patient is being admitted for inpatient treatment for surgery, pain control, PT, OT, prophylactic antibiotics, VTE prophylaxis, progressive ambulation and ADL's and discharge planning. The patient is planning to be discharged home with home health services

## 2022-11-22 NOTE — Evaluation (Signed)
Physical Therapy Evaluation Patient Details Name: Joan [name] MRN: [id number] DOB: 1956-02-12 Today's Date: 11/22/2022  History of Present Illness  Pt s/p L TKR  Clinical Impression  Pt s/p L TKR and presents with decreased L LE strength/ROM and post op pain limiting functional mobility.  Pt should progress to dc home with family assist.     Recommendations for follow up therapy are one component of a multi-disciplinary discharge planning process, led by the attending physician.  Recommendations may be updated based on patient status, additional functional criteria and insurance authorization.  Follow Up Recommendations Follow physician's recommendations for discharge plan and follow up therapies      Assistance Recommended at Discharge Intermittent Supervision/Assistance  Patient can return home with the following  A little help with walking and/or transfers;A little help with bathing/dressing/bathroom;Assistance with cooking/housework;Assist for transportation;Help with stairs or ramp for entrance    Equipment Recommendations Rolling walker (2 wheels)  Recommendations for Other Services       Functional Status Assessment Patient has had a recent decline in their functional status and demonstrates the ability to make significant improvements in function in a reasonable and predictable amount of time.     Precautions / Restrictions Precautions Precautions: Knee;Fall Precaution Booklet Issued: No Required Braces or Orthoses: Knee Immobilizer - Left Knee Immobilizer - Left: Discontinue once straight leg raise with < 10 degree lag Restrictions Weight Bearing Restrictions: No Other Position/Activity Restrictions: WBAT      Mobility  Bed Mobility Overal bed mobility: Needs Assistance Bed Mobility: Supine to Sit     Supine to sit: Min assist     General bed mobility comments: increased time with cues for sequence and assist for L LE    Transfers Overall transfer  level: Needs assistance Equipment used: Rolling walker (2 wheels) Transfers: Sit to/from Stand Sit to Stand: Min assist, Mod assist           General transfer comment: cues for LE management and use of UEs to self assist;  Physical assist to bring wt up and fwd and to balance in standing with RW    Ambulation/Gait Ambulation/Gait assistance: Min assist Gait Distance (Feet): 3 Feet Assistive device: Rolling walker (2 wheels) Gait Pattern/deviations: Step-to pattern, Decreased step length - right, Decreased step length - left, Shuffle, Antalgic Gait velocity: decr     General Gait Details: cues for sequence, posture, position from RW - distance ltd by nausea  Stairs            Wheelchair Mobility    Modified Rankin (Stroke Patients Only)       Balance Overall balance assessment: Needs assistance Sitting-balance support: No upper extremity supported, Feet supported Sitting balance-Leahy Scale: Fair     Standing balance support: Bilateral upper extremity supported Standing balance-Leahy Scale: Poor                               Pertinent Vitals/Pain Pain Assessment Pain Assessment: 0-10 Pain Score: 4  Pain Location: L knee Pain Descriptors / Indicators: Aching, Sore Pain Intervention(s): Limited activity within patient's tolerance, Monitored during session, Premedicated before session, Ice applied    Home Living Family/patient expects to be discharged to:: Private residence Living Arrangements: Spouse/significant other Available Help at Discharge: Family Type of Home: House Home Access: Stairs to enter Entrance Stairs-Rails: Right Entrance Stairs-Number of Steps: 2   Home Layout: One level Home Equipment: Standard Walker;Cane - single point;Toilet riser  Prior Function Prior Level of Function : Independent/Modified Independent                     Hand Dominance        Extremity/Trunk Assessment   Upper Extremity  Assessment Upper Extremity Assessment: Overall WFL for tasks assessed    Lower Extremity Assessment Lower Extremity Assessment: LLE deficits/detail    Cervical / Trunk Assessment Cervical / Trunk Assessment: Normal  Communication   Communication: No difficulties  Cognition Arousal/Alertness: Awake/alert Behavior During Therapy: WFL for tasks assessed/performed Overall Cognitive Status: Within Functional Limits for tasks assessed                                          General Comments      Exercises Total Joint Exercises Ankle Circles/Pumps: AROM, Both, 10 reps, Supine   Assessment/Plan    PT Assessment Patient needs continued PT services  PT Problem List Decreased strength;Decreased range of motion;Decreased activity tolerance;Decreased balance;Decreased mobility;Decreased knowledge of use of DME;Pain       PT Treatment Interventions DME instruction;Gait training;Stair training;Therapeutic activities;Functional mobility training;Therapeutic exercise;Patient/family education    PT Goals (Current goals can be found in the Care Plan section)  Acute Rehab PT Goals Patient Stated Goal: Regain IND PT Goal Formulation: With patient Time For Goal Achievement: 11/28/22 Potential to Achieve Goals: Good    Frequency 7X/week     Co-evaluation               AM-PAC PT "6 Clicks" Mobility  Outcome Measure Help needed turning from your back to your side while in a flat bed without using bedrails?: A Little Help needed moving from lying on your back to sitting on the side of a flat bed without using bedrails?: A Little Help needed moving to and from a bed to a chair (including a wheelchair)?: A Little Help needed standing up from a chair using your arms (e.g., wheelchair or bedside chair)?: A Little Help needed to walk in hospital room?: Total Help needed climbing 3-5 steps with a railing? : Total 6 Click Score: 14    End of Session Equipment Utilized  During Treatment: Gait belt;Left knee immobilizer Activity Tolerance: Patient tolerated treatment well Patient left: in chair;with call bell/phone within reach;with family/visitor present Nurse Communication: Mobility status PT Visit Diagnosis: Unsteadiness on feet (R26.81);Difficulty in walking, not elsewhere classified (R26.2);Pain Pain - Right/Left: Left Pain - part of body: Knee    Time: 5056-9794 PT Time Calculation (min) (ACUTE ONLY): 34 min   Charges:   PT Evaluation $PT Eval Low Complexity: 1 Low PT Treatments $Therapeutic Activity: 8-22 mins        Debe Coder PT Acute Rehabilitation Services Pager [phone] Office [phone]   Jamieson Lisa 11/22/2022, 5:12 PM

## 2022-11-22 NOTE — Anesthesia Procedure Notes (Signed)
Anesthesia Regional Block: Adductor canal block   Pre-Anesthetic Checklist: , timeout performed,  Correct Patient, Correct Site, Correct Laterality,  Correct Procedure, Correct Position, site marked,  Risks and benefits discussed,  Surgical consent,  Pre-op evaluation,  At surgeon's request and post-op pain management  Laterality: Left  Prep: chloraprep       Needles:  Injection technique: Single-shot  Needle Type: Echogenic Stimulator Needle     Needle Length: 10cm  Needle Gauge: 20     Additional Needles:   Procedures:,,,, ultrasound used (permanent image in chart),,    Narrative:  Start time: 11/22/2022 6:55 AM End time: 11/22/2022 6:59 AM Injection made incrementally with aspirations every 5 mL.  Performed by: Personally  Anesthesiologist: Lidia Collum, MD  Additional Notes: Standard monitors applied. Skin prepped. Good needle visualization with ultrasound. Injection made in 5cc increments with no resistance to injection. Patient tolerated the procedure well.

## 2022-11-22 NOTE — Anesthesia Postprocedure Evaluation (Signed)
Anesthesia Post Note  Patient: Izora Ribas  Procedure(s) Performed: LEFT TOTAL KNEE ARTHROPLASTY (Left: Knee)     Patient location during evaluation: PACU Anesthesia Type: Spinal Level of consciousness: oriented and awake and alert Pain management: pain level controlled Vital Signs Assessment: post-procedure vital signs reviewed and stable Respiratory status: spontaneous breathing, respiratory function stable and nonlabored ventilation Cardiovascular status: blood pressure returned to baseline and stable Postop Assessment: no headache, no backache, no apparent nausea or vomiting and spinal receding Anesthetic complications: no   No notable events documented.  Last Vitals:  Vitals:   11/22/22 1347 11/22/22 1714  BP: 122/74 (!) 98/56  Pulse: 68 64  Resp: 16 16  Temp: 36.5 C (!) 36.4 C  SpO2: 97% 97%    Last Pain:  Vitals:   11/22/22 1548  TempSrc:   PainSc: 5                  Lidia Collum

## 2022-11-22 NOTE — Anesthesia Procedure Notes (Signed)
Spinal  Patient location during procedure: OR End time: 11/22/2022 7:26 AM Reason for block: surgical anesthesia Staffing Performed: resident/CRNA  Resident/CRNA: Noralyn Pick D, CRNA Performed by: Ashlen Kiger, Clinical cytogeneticist D, CRNA Authorized by: Lidia Collum, MD   Preanesthetic Checklist Completed: patient identified, IV checked, site marked, risks and benefits discussed, surgical consent, monitors and equipment checked, pre-op evaluation and timeout performed Spinal Block Patient position: sitting Prep: DuraPrep Patient monitoring: heart rate, continuous pulse ox and blood pressure Approach: midline Location: L3-4 Injection technique: single-shot Needle Needle type: Pencan  Needle gauge: 24 G Needle length: 9 cm Assessment Sensory level: T6 Events: CSF return

## 2022-11-23 ENCOUNTER — Other Ambulatory Visit (HOSPITAL_COMMUNITY): Payer: Self-pay

## 2022-11-23 DIAGNOSIS — I10 Essential (primary) hypertension: Secondary | ICD-10-CM | POA: Diagnosis not present

## 2022-11-23 DIAGNOSIS — M1712 Unilateral primary osteoarthritis, left knee: Secondary | ICD-10-CM | POA: Diagnosis not present

## 2022-11-23 DIAGNOSIS — Z79899 Other long term (current) drug therapy: Secondary | ICD-10-CM | POA: Diagnosis not present

## 2022-11-23 DIAGNOSIS — E039 Hypothyroidism, unspecified: Secondary | ICD-10-CM | POA: Diagnosis not present

## 2022-11-23 LAB — CBC
HCT: 31.8 % — ABNORMAL LOW (ref 36.0–46.0)
Hemoglobin: 10.8 g/dL — ABNORMAL LOW (ref 12.0–15.0)
MCH: 32.7 pg (ref 26.0–34.0)
MCHC: 34 g/dL (ref 30.0–36.0)
MCV: 96.4 fL (ref 80.0–100.0)
Platelets: 190 10*3/uL (ref 150–400)
RBC: 3.3 MIL/uL — ABNORMAL LOW (ref 3.87–5.11)
RDW: 13.2 % (ref 11.5–15.5)
WBC: 12 10*3/uL — ABNORMAL HIGH (ref 4.0–10.5)
nRBC: 0 % (ref 0.0–0.2)

## 2022-11-23 LAB — BASIC METABOLIC PANEL
Anion gap: 6 (ref 5–15)
BUN: 16 mg/dL (ref 8–23)
CO2: 27 mmol/L (ref 22–32)
Calcium: 8.1 mg/dL — ABNORMAL LOW (ref 8.9–10.3)
Chloride: 100 mmol/L (ref 98–111)
Creatinine, Ser: 0.76 mg/dL (ref 0.44–1.00)
GFR, Estimated: >60 mL/min (ref >60)
Glucose, Bld: 128 mg/dL — ABNORMAL HIGH (ref 70–99)
Potassium: 3.8 mmol/L (ref 3.5–5.1)
Sodium: 133 mmol/L — ABNORMAL LOW (ref 135–145)

## 2022-11-23 MED ORDER — ASPIRIN 81 MG PO CHEW
81.0000 mg | CHEWABLE_TABLET | Freq: Two times a day (BID) | ORAL | 0 refills | Status: DC
  Filled 2022-11-23: qty 30, 15d supply, fill #0

## 2022-11-23 MED ORDER — SODIUM CHLORIDE 0.9 % IV BOLUS
500.0000 mL | Freq: Once | INTRAVENOUS | Status: AC
  Administered 2022-11-23: 500 mL via INTRAVENOUS

## 2022-11-23 MED ORDER — OXYCODONE HCL 5 MG PO TABS
5.0000 mg | ORAL_TABLET | ORAL | 0 refills | Status: DC | PRN
  Filled 2022-11-23: qty 30, 5d supply, fill #0

## 2022-11-23 MED ORDER — METHOCARBAMOL 500 MG PO TABS
500.0000 mg | ORAL_TABLET | Freq: Four times a day (QID) | ORAL | 1 refills | Status: DC | PRN
  Filled 2022-11-23: qty 30, 8d supply, fill #0
  Filled 2022-11-28: qty 30, 8d supply, fill #1

## 2022-11-23 NOTE — Plan of Care (Signed)
  Problem: Pain Management: Goal: Pain level will decrease with appropriate interventions Outcome: Progressing   

## 2022-11-23 NOTE — Progress Notes (Signed)
Subjective: 1 Day Post-Op Procedure(s) (LRB): LEFT TOTAL KNEE ARTHROPLASTY (Left) Patient reports pain as moderate.  The patient has been up with therapy but her blood pressure is running a little soft.  She did get lightheaded at the end of her therapy session.  Objective: Vital signs in last 24 hours: Temp:  [97.5 F (36.4 C)-98.2 F (36.8 C)] 97.9 F (36.6 C) (12/30 0840) Pulse Rate:  [52-68] 56 (12/30 0840) Resp:  [15-18] 18 (12/30 0840) BP: (86-138)/(50-74) 106/59 (12/30 0820) SpO2:  [92 %-100 %] 96 % (12/30 0840)  Intake/Output from previous day: 12/29 0701 - 12/30 0700 In: 3624 [P.O.:720; I.V.:2749; IV Piggyback:155] Out: 2975 [Urine:2950; Blood:25] Intake/Output this shift: Total I/O In: 89.8 [I.V.:89.8] Out: -   Recent Labs    11/23/22 0402  HGB 10.8*   Recent Labs    11/23/22 0402  WBC 12.0*  RBC 3.30*  HCT 31.8*  PLT 190   Recent Labs    11/23/22 0402  NA 133*  K 3.8  CL 100  CO2 27  BUN 16  CREATININE 0.76  GLUCOSE 128*  CALCIUM 8.1*   No results for input(s): "LABPT", "INR" in the last 72 hours.  Sensation intact distally Intact pulses distally Dorsiflexion/Plantar flexion intact Incision: dressing C/D/I No cellulitis present Compartment soft   Assessment/Plan: 1 Day Post-Op Procedure(s) (LRB): LEFT TOTAL KNEE ARTHROPLASTY (Left) Up with therapy Plan for discharge tomorrow Discharge home with home health  Will give her a normal saline bolus of fluid to help with her pressure. She definitely needs to stay today given her hypotension and lightheadedness with the goal of hopefully discharge to home tomorrow.    Mcarthur Rossetti 11/23/2022, 9:33 AM

## 2022-11-23 NOTE — TOC Transition Note (Signed)
Transition of Care [facility]) - CM/SW Discharge Note  Patient Details  Name: Joan [name] MRN: [id number] Date of Birth: 1956/09/05  [facility] CM/SW Contact:  Sherie Don, LCSW Phone Number: 11/23/2022, 11:47 AM  Clinical Narrative: Patient is expected to discharge home after working with PT. CSW spoke with patient regarding discharge plan and needs. Patient will go home with HHPT, which was prearranged through [place]. Patient will need a rolling walker and is agreeable to [employer] delivering the walker to her room. CSW made DME referral to [facility] with [employer], which was accepted. TOC signing off.   Final next level of care: Home w Home Health Services Barriers to Discharge: No Barriers Identified  Patient Goals and CMS Choice CMS Medicare.gov Compare Post Acute Care list provided to:: Patient Choice offered to / list presented to : Patient  Discharge Plan and Services Additional resources added to the After Visit Summary for         DME Arranged: Walker rolling DME Agency: [facility] [employer] Date DME Agency Contacted: 11/23/22 Representative spoke with at DME Agency: [facility]on [facility]ills HH Arranged: PT [place]: [place] Representative spoke with at [place]: Prearranged in orthopedist's office  Social Determinants of Health ([place]) Interventions SDOH Screenings   Food Insecurity: No Food Insecurity (11/22/2022)  Housing: Low Risk  (11/22/2022)  Transportation Needs: No Transportation Needs (11/22/2022)  Utilities: Not At Risk (11/22/2022)  Alcohol Screen: Low Risk  (04/05/2022)  Depression (PHQ2-9): Low Risk  (04/08/2022)  Financial Resource Strain: Low Risk  (04/05/2022)  Physical Activity: Sufficiently Active (04/05/2022)  Social Connections: Socially Integrated (04/05/2022)  Stress: No Stress Concern Present (04/05/2022)  Tobacco Use: Low Risk  (11/22/2022)   Readmission Risk Interventions     No data to display

## 2022-11-23 NOTE — Discharge Instructions (Signed)

## 2022-11-23 NOTE — Progress Notes (Signed)
PT Cancellation Note  Patient Details Name: Ginamarie Banfield MRN: [id number] DOB: 11/26/1955   Cancelled Treatment:     PT attempted x 2 this pm but deferred 2* pt's report of elevated pain level.  Will follow in am.   Shamia Uppal 11/23/2022, 3:27 PM

## 2022-11-23 NOTE — Progress Notes (Signed)
Physical Therapy Treatment Patient Details Name: Joan [name] MRN: [id number] DOB: 1955/11/29 Today's Date: 11/23/2022   History of Present Illness Pt s/p L TKR    PT Comments    Pt very cooperative and progressing well with mobility but limited by c/o dizziness with mobility - BP 92/64 - RN and DR aware.   Recommendations for follow up therapy are one component of a multi-disciplinary discharge planning process, led by the attending physician.  Recommendations may be updated based on patient status, additional functional criteria and insurance authorization.  Follow Up Recommendations  Follow physician's recommendations for discharge plan and follow up therapies     Assistance Recommended at Discharge Intermittent Supervision/Assistance  Patient can return home with the following A little help with walking and/or transfers;A little help with bathing/dressing/bathroom;Assistance with cooking/housework;Assist for transportation;Help with stairs or ramp for entrance   Equipment Recommendations  Rolling walker (2 wheels)    Recommendations for Other Services       Precautions / Restrictions Precautions Precautions: Knee;Fall Precaution Booklet Issued: No Required Braces or Orthoses: Knee Immobilizer - Left Knee Immobilizer - Left: Discontinue once straight leg raise with < 10 degree lag (Pt performed IND SLR this am) Restrictions Weight Bearing Restrictions: No Other Position/Activity Restrictions: WBAT     Mobility  Bed Mobility Overal bed mobility: Needs Assistance Bed Mobility: Supine to Sit     Supine to sit: Min guard     General bed mobility comments: increased time with cues for sequence and assist for L LE    Transfers Overall transfer level: Needs assistance Equipment used: Rolling walker (2 wheels) Transfers: Sit to/from Stand Sit to Stand: Min guard           General transfer comment: cues for LE management and use of UEs to self assist;     Ambulation/Gait Ambulation/Gait assistance: Min assist, Min guard Gait Distance (Feet): 74 Feet Assistive device: Rolling walker (2 wheels) Gait Pattern/deviations: Step-to pattern, Decreased step length - right, Decreased step length - left, Shuffle, Antalgic Gait velocity: decr     General Gait Details: cues for sequence, posture, position from RW - distance ltd by increasing pain/dizziness   Stairs             Wheelchair Mobility    Modified Rankin (Stroke Patients Only)       Balance Overall balance assessment: Needs assistance Sitting-balance support: No upper extremity supported, Feet supported Sitting balance-Leahy Scale: Good     Standing balance support: Single extremity supported Standing balance-Leahy Scale: Poor                              Cognition Arousal/Alertness: Awake/alert Behavior During Therapy: WFL for tasks assessed/performed Overall Cognitive Status: Within Functional Limits for tasks assessed                                          Exercises Total Joint Exercises Ankle Circles/Pumps: AROM, Both, 10 reps, Supine Quad Sets: AROM, Both, 10 reps, Supine Heel Slides: AAROM, Left, 15 reps, Supine Straight Leg Raises: AROM, Left, 10 reps, Supine Goniometric ROM: AAROM L knee -5-45    General Comments        Pertinent Vitals/Pain Pain Assessment Pain Assessment: 0-10 Pain Score: 6  Pain Location: L knee Pain Descriptors / Indicators: Aching, Sore Pain Intervention(s): Limited activity within  patient's tolerance, Premedicated before session, Monitored during session, Ice applied    Home Living                          Prior Function            PT Goals (current goals can now be found in the care plan section) Acute Rehab PT Goals Patient Stated Goal: Regain IND PT Goal Formulation: With patient Time For Goal Achievement: 11/28/22 Potential to Achieve Goals: Good Progress towards PT  goals: Progressing toward goals    Frequency    7X/week      PT Plan Current plan remains appropriate    Co-evaluation              AM-PAC PT "6 Clicks" Mobility   Outcome Measure  Help needed turning from your back to your side while in a flat bed without using bedrails?: A Little Help needed moving from lying on your back to sitting on the side of a flat bed without using bedrails?: A Little Help needed moving to and from a bed to a chair (including a wheelchair)?: A Little Help needed standing up from a chair using your arms (e.g., wheelchair or bedside chair)?: A Little Help needed to walk in hospital room?: A Little Help needed climbing 3-5 steps with a railing? : A Lot 6 Click Score: 17    End of Session Equipment Utilized During Treatment: Gait belt Activity Tolerance: Patient tolerated treatment well Patient left: in chair;with call bell/phone within reach Nurse Communication: Mobility status PT Visit Diagnosis: Unsteadiness on feet (R26.81);Difficulty in walking, not elsewhere classified (R26.2);Pain Pain - Right/Left: Left Pain - part of body: Knee     Time: 1275-1700 PT Time Calculation (min) (ACUTE ONLY): 31 min  Charges:  $Gait Training: 8-22 mins $Therapeutic Exercise: 8-22 mins                     Debe Coder PT Acute Rehabilitation Services Pager [phone] Office [phone]    Breyah Akhter 11/23/2022, 9:27 AM

## 2022-11-24 DIAGNOSIS — E039 Hypothyroidism, unspecified: Secondary | ICD-10-CM | POA: Diagnosis not present

## 2022-11-24 DIAGNOSIS — M1712 Unilateral primary osteoarthritis, left knee: Secondary | ICD-10-CM | POA: Diagnosis not present

## 2022-11-24 DIAGNOSIS — I10 Essential (primary) hypertension: Secondary | ICD-10-CM | POA: Diagnosis not present

## 2022-11-24 DIAGNOSIS — Z79899 Other long term (current) drug therapy: Secondary | ICD-10-CM | POA: Diagnosis not present

## 2022-11-24 NOTE — Progress Notes (Signed)
Physical Therapy Treatment Patient Details Name: Joan [name] MRN: [id number] DOB: [birth year] Today's Date: 11/24/2022   History of Present Illness Pt s/p L TKR    PT Comments    Pt continues to progress well with mobility.  Pt up to bathroom for toileting and hygiene at sink, ambulated limited distance in hall, negotiated stairs and reviewed written HEP.  Pt reports HHPT was to arrive today but not sure when rescheduled to.   Recommendations for follow up therapy are one component of a multi-disciplinary discharge planning process, led by the attending physician.  Recommendations may be updated based on patient status, additional functional criteria and insurance authorization.  Follow Up Recommendations  Follow physician's recommendations for discharge plan and follow up therapies     Assistance Recommended at Discharge Intermittent Supervision/Assistance  Patient can return home with the following A little help with walking and/or transfers;A little help with bathing/dressing/bathroom;Assistance with cooking/housework;Assist for transportation;Help with stairs or ramp for entrance   Equipment Recommendations  Rolling walker (2 wheels)    Recommendations for Other Services       Precautions / Restrictions Precautions Precautions: Knee;Fall Precaution Booklet Issued: No Required Braces or Orthoses: Knee Immobilizer - Left Knee Immobilizer - Left: Discontinue once straight leg raise with < 10 degree lag Restrictions Weight Bearing Restrictions: No LLE Weight Bearing: Weight bearing as tolerated Other Position/Activity Restrictions: WBAT     Mobility  Bed Mobility Overal bed mobility: Needs Assistance Bed Mobility: Supine to Sit     Supine to sit: Min assist     General bed mobility comments: Pt up in chair and requests back to same    Transfers Overall transfer level: Needs assistance Equipment used: Rolling walker (2 wheels) Transfers: Sit to/from Stand Sit  to Stand: Min guard, Supervision           General transfer comment: cues for LE management and use of UEs to self assist;    Ambulation/Gait Ambulation/Gait assistance: Min guard, Supervision Gait Distance (Feet): 60 Feet (and 15' into bathroom) Assistive device: Rolling walker (2 wheels) Gait Pattern/deviations: Step-to pattern, Decreased step length - right, Decreased step length - left, Shuffle, Antalgic Gait velocity: decr     General Gait Details: min cues for sequence, posture, position from RW   Stairs Stairs: Yes Stairs assistance: Min assist Stair Management: One rail Left, Forwards, With cane, Step to pattern Number of Stairs: 5 General stair comments: 2+3 stairs with rail, cane and cues for sequence   Wheelchair Mobility    Modified Rankin (Stroke Patients Only)       Balance Overall balance assessment: Needs assistance Sitting-balance support: No upper extremity supported, Feet supported Sitting balance-Leahy Scale: Good     Standing balance support: No upper extremity supported Standing balance-Leahy Scale: Fair                              Cognition Arousal/Alertness: Awake/alert Behavior During Therapy: WFL for tasks assessed/performed Overall Cognitive Status: Within Functional Limits for tasks assessed                                          Exercises Total Joint Exercises Ankle Circles/Pumps: AROM, Both, 10 reps, Supine Quad Sets: AROM, Both, 10 reps, Supine Heel Slides: AAROM, Left, 15 reps, Supine Straight Leg Raises: Left, 10 reps, Supine, AAROM  General Comments        Pertinent Vitals/Pain Pain Assessment Pain Assessment: 0-10 Pain Score: 7  Pain Location: L knee Pain Descriptors / Indicators: Aching, Sore Pain Intervention(s): Limited activity within patient's tolerance, Monitored during session, Premedicated before session, Ice applied    Home Living                           Prior Function            PT Goals (current goals can now be found in the care plan section) Acute Rehab PT Goals Patient Stated Goal: Regain IND PT Goal Formulation: With patient Time For Goal Achievement: 11/28/22 Potential to Achieve Goals: Good Progress towards PT goals: Progressing toward goals    Frequency    7X/week      PT Plan Current plan remains appropriate    Co-evaluation              AM-PAC PT "6 Clicks" Mobility   Outcome Measure  Help needed turning from your back to your side while in a flat bed without using bedrails?: A Little Help needed moving from lying on your back to sitting on the side of a flat bed without using bedrails?: A Little Help needed moving to and from a bed to a chair (including a wheelchair)?: A Little Help needed standing up from a chair using your arms (e.g., wheelchair or bedside chair)?: A Little Help needed to walk in hospital room?: A Little Help needed climbing 3-5 steps with a railing? : A Little 6 Click Score: 18    End of Session Equipment Utilized During Treatment: Gait belt Activity Tolerance: Patient tolerated treatment well Patient left: in chair;with call bell/phone within reach Nurse Communication: Mobility status PT Visit Diagnosis: Unsteadiness on feet (R26.81);Difficulty in walking, not elsewhere classified (R26.2);Pain Pain - Right/Left: Left Pain - part of body: Knee     Time: 0086-7619 PT Time Calculation (min) (ACUTE ONLY): 27 min  Charges:  $Gait Training: 8-22 mins $Therapeutic Exercise: 8-22 mins $Therapeutic Activity: 8-22 mins                     Debe Coder PT Acute Rehabilitation Services Pager [phone] Office [phone]    Joan [name] 11/24/2022, 12:44 PM

## 2022-11-24 NOTE — Progress Notes (Signed)
Patient ID: Joan [name], female   DOB: Nov 02, 1956, 66 y.o.   MRN: [id number] Patient states she still has knee pain.  Plan for therapy today and discharge this afternoon.  Prescriptions for oxycodone and Robaxin.  Continue with the ice machine.  Patient has home health physical therapy set up.

## 2022-11-24 NOTE — Progress Notes (Signed)
Physical Therapy Treatment Patient Details Name: Joan [name] MRN: [id number] DOB: February 18, 1956 Today's Date: 11/24/2022   History of Present Illness Pt s/p L TKR    PT Comments    Pt progressing steadily with mobility and up to ambulate increased distance in hall demonstrating increased stability.  Spouse present to review don/doff KI and HEP.  Will follow up with stair training.   Recommendations for follow up therapy are one component of a multi-disciplinary discharge planning process, led by the attending physician.  Recommendations may be updated based on patient status, additional functional criteria and insurance authorization.  Follow Up Recommendations  Follow physician's recommendations for discharge plan and follow up therapies     Assistance Recommended at Discharge Intermittent Supervision/Assistance  Patient can return home with the following A little help with walking and/or transfers;A little help with bathing/dressing/bathroom;Assistance with cooking/housework;Assist for transportation;Help with stairs or ramp for entrance   Equipment Recommendations  Rolling walker (2 wheels)    Recommendations for Other Services       Precautions / Restrictions Precautions Precautions: Knee;Fall Precaution Booklet Issued: No Required Braces or Orthoses: Knee Immobilizer - Left Knee Immobilizer - Left: Discontinue once straight leg raise with < 10 degree lag Restrictions Weight Bearing Restrictions: No LLE Weight Bearing: Weight bearing as tolerated     Mobility  Bed Mobility Overal bed mobility: Needs Assistance Bed Mobility: Supine to Sit     Supine to sit: Min assist     General bed mobility comments: increased time with cues for sequence and assist for L LE    Transfers Overall transfer level: Needs assistance Equipment used: Rolling walker (2 wheels) Transfers: Sit to/from Stand Sit to Stand: Min guard           General transfer comment: cues for LE  management and use of UEs to self assist;    Ambulation/Gait Ambulation/Gait assistance: Min guard Gait Distance (Feet): 90 Feet Assistive device: Rolling walker (2 wheels) Gait Pattern/deviations: Step-to pattern, Decreased step length - right, Decreased step length - left, Shuffle, Antalgic Gait velocity: decr     General Gait Details: cues for sequence, posture, position from [employer]             Wheelchair Mobility    Modified Rankin (Stroke Patients Only)       Balance Overall balance assessment: Needs assistance Sitting-balance support: No upper extremity supported, Feet supported Sitting balance-Leahy Scale: Good     Standing balance support: No upper extremity supported Standing balance-Leahy Scale: Fair                              Cognition Arousal/Alertness: Awake/alert Behavior During Therapy: WFL for tasks assessed/performed Overall Cognitive Status: Within Functional Limits for tasks assessed                                          Exercises Total Joint Exercises Ankle Circles/Pumps: AROM, Both, 10 reps, Supine Quad Sets: AROM, Both, 10 reps, Supine Heel Slides: AAROM, Left, 15 reps, Supine Straight Leg Raises: Left, 10 reps, Supine, AAROM    General Comments        Pertinent Vitals/Pain Pain Assessment Pain Assessment: 0-10 Pain Score: 7  Pain Location: L knee Pain Descriptors / Indicators: Aching, Sore Pain Intervention(s): Limited activity within patient's tolerance, Monitored during session, Premedicated before  session, Ice applied    Home Living                          Prior Function            PT Goals (current goals can now be found in the care plan section) Acute Rehab PT Goals Patient Stated Goal: Regain IND PT Goal Formulation: With patient Time For Goal Achievement: 11/28/22 Potential to Achieve Goals: Good Progress towards PT goals: Progressing toward goals     Frequency    7X/week      PT Plan Current plan remains appropriate    Co-evaluation              AM-PAC PT "6 Clicks" Mobility   Outcome Measure  Help needed turning from your back to your side while in a flat bed without using bedrails?: A Little Help needed moving from lying on your back to sitting on the side of a flat bed without using bedrails?: A Little Help needed moving to and from a bed to a chair (including a wheelchair)?: A Little Help needed standing up from a chair using your arms (e.g., wheelchair or bedside chair)?: A Little Help needed to walk in hospital room?: A Little Help needed climbing 3-5 steps with a railing? : A Lot 6 Click Score: 17    End of Session Equipment Utilized During Treatment: Gait belt Activity Tolerance: Patient tolerated treatment well Patient left: in chair;with call bell/phone within reach Nurse Communication: Mobility status PT Visit Diagnosis: Unsteadiness on feet (R26.81);Difficulty in walking, not elsewhere classified (R26.2);Pain Pain - Right/Left: Left Pain - part of body: Knee     Time: 1027-1107 PT Time Calculation (min) (ACUTE ONLY): 40 min  Charges:  $Gait Training: 8-22 mins $Therapeutic Exercise: 8-22 mins                     Debe Coder PT Acute Rehabilitation Services Pager [phone] Office [phone]    [facility] 11/24/2022, 12:38 PM

## 2022-11-24 NOTE — Discharge Summary (Signed)
Discharge Diagnoses:  Principal Problem:   Unilateral primary osteoarthritis, left knee Active Problems:   Status post total left knee replacement   Surgeries: Procedure(s): LEFT TOTAL KNEE ARTHROPLASTY on 11/22/2022    Consultants:   Discharged Condition: Improved  Hospital Course: Joan [name] is an 66 y.o. female who was admitted 11/22/2022 with a chief complaint of osteoarthritis left knee, with a final diagnosis of OSTEOARTHRITIS / [place].  Patient was brought to the operating room on 11/22/2022 and underwent Procedure(s): LEFT TOTAL KNEE ARTHROPLASTY.    Patient was given perioperative antibiotics:  Anti-infectives (From admission, onward)    Start     Dose/Rate Route Frequency Ordered Stop   11/22/22 1400  ceFAZolin (ANCEF) IVPB 1 g/50 mL premix        1 g 100 mL/hr over 30 Minutes Intravenous Every 6 hours 11/22/22 1002 11/22/22 2239   11/22/22 0600  ceFAZolin (ANCEF) IVPB 2g/100 mL premix        2 g 200 mL/hr over 30 Minutes Intravenous On call to O.R. 11/22/22 4235 11/22/22 0727     .  Patient was given sequential compression devices, early ambulation, and aspirin for DVT prophylaxis.  Recent vital signs: Patient Vitals for the past 24 hrs:  BP Temp Temp src Pulse Resp SpO2  11/24/22 0504 130/72 98.6 F (37 C) Oral 75 16 95 %  11/23/22 2100 123/63 98 F (36.7 C) Oral 68 18 93 %  11/23/22 1348 131/80 98 F (36.7 C) Oral 63 17 97 %  .  Recent laboratory studies: No results found.  Discharge Medications:   Allergies as of 11/24/2022       Reactions   Sulfonamide Derivatives Rash   Medi Alert bracelet  is recommended   Codeine Nausea Only   Tramadol Nausea And Vomiting        Medication List     TAKE these medications    amLODipine-benazepril 5-20 MG capsule Commonly known as: LOTREL Take 1 capsule by mouth daily. Keep scheduled appt for future refills   aspirin 81 MG chewable tablet Chew 1 tablet (81 mg total)  by mouth 2 (two) times daily.   cetirizine 10 MG tablet Commonly known as: ZYRTEC Take 10 mg by mouth at bedtime.   estradiol 0.05 MG/24HR patch Commonly known as: VIVELLE-DOT APPLY 1 PATCH ONTO THE SKIN 2 TIMES WEEKLY   FISH OIL OMEGA-3 PO Take 1 capsule by mouth 2 (two) times daily.   GLUCOSAMINE 1500 COMPLEX PO Take 1,500 mg by mouth 2 (two) times daily. Glucosamine 1500 mg bid; Chondrotin 1100 mg bid   methocarbamol 500 MG tablet Commonly known as: ROBAXIN Take 1 tablet (500 mg total) by mouth every 6 (six) hours as needed for muscle spasms.   multivitamin with minerals Tabs tablet Take 1 tablet by mouth daily.   naproxen sodium 220 MG tablet Commonly known as: ALEVE Take 220 mg by mouth 2 (two) times daily with a meal.   nystatin-triamcinolone ointment Commonly known as: MYCOLOG APPLY TO THE AFFECTED AREA(S) 2 TIMES PER DAY   oxyCODONE 5 MG immediate release tablet Commonly known as: Oxy IR/ROXICODONE Take 1-2 tablets (5-10 mg total) by mouth every 4 (four) hours as needed for moderate pain (pain score 4-6).   Synthroid 100 MCG tablet Generic drug: levothyroxine Take 1 tablet by mouth in the morning on an empty stomach   TART CHERRY PO Take 1,200 mg by mouth daily.   triamterene-hydrochlorothiazide 37.5-25 MG capsule Commonly known as:  DYAZIDE Take 1 capsule total by mouth daily.   Turmeric 500 MG Caps Take 1,500 mg by mouth daily.   Vitamin D (Ergocalciferol) 1.25 MG (50000 UNIT) Caps capsule Commonly known as: DRISDOL Take 1 capsule by mouth once a week               Durable Medical Equipment  (From admission, onward)           Start     Ordered   11/22/22 1003  DME 3 n 1  Once        11/22/22 1002   11/22/22 1003  DME Walker rolling  Once       Question Answer Comment  Walker: With 5 Inch Wheels   Patient needs a walker to treat with the following condition Status post total left knee replacement      11/22/22 1002             Diagnostic Studies: DG Knee Left Port  Result Date: 11/22/2022 CLINICAL DATA:  Post LEFT total knee arthroplasty EXAM: PORTABLE LEFT KNEE - 1-2 VIEW COMPARISON:  Portable exam 0905 hours compared to 08/20/2021 FINDINGS: Osseous mineralization normal. Components of LEFT knee arthroplasty identified. No fracture, dislocation, or bone destruction. IMPRESSION: Post LEFT total knee arthroplasty without acute complication. Electronically Signed   By: Lavonia Dana M.D.   On: 11/22/2022 09:42    Patient benefited maximally from their hospital stay and there were no complications.     Disposition: Discharge disposition: 01-Home or Self Care      Discharge Instructions     Call MD / Call 911   Complete by: As directed    If you experience chest pain or shortness of breath, CALL 911 and be transported to the hospital emergency room.  If you develope a fever above 101 F, pus (white drainage) or increased drainage or redness at the wound, or calf pain, call your surgeon's office.   Constipation Prevention   Complete by: As directed    Drink plenty of fluids.  Prune juice may be helpful.  You may use a stool softener, such as Colace (over the counter) 100 mg twice a day.  Use MiraLax (over the counter) for constipation as needed.   Diet - low sodium heart healthy   Complete by: As directed    Increase activity slowly as tolerated   Complete by: As directed    Post-operative opioid taper instructions:   Complete by: As directed    POST-OPERATIVE OPIOID TAPER INSTRUCTIONS: It is important to wean off of your opioid medication as soon as possible. If you do not need pain medication after your surgery it is ok to stop day one. Opioids include: Codeine, Hydrocodone(Norco, Vicodin), Oxycodone(Percocet, oxycontin) and hydromorphone amongst others.  Long term and even short term use of opiods can cause: Increased pain response Dependence Constipation Depression Respiratory depression And more.   Withdrawal symptoms can include Flu like symptoms Nausea, vomiting And more Techniques to manage these symptoms Hydrate well Eat regular healthy meals Stay active Use relaxation techniques(deep breathing, meditating, yoga) Do Not substitute Alcohol to help with tapering If you have been on opioids for less than two weeks and do not have pain than it is ok to stop all together.  Plan to wean off of opioids This plan should start within one week post op of your joint replacement. Maintain the same interval or time between taking each dose and first decrease the dose.  Cut the total daily intake  of opioids by one tablet each day Next start to increase the time between doses. The last dose that should be eliminated is the evening dose.          Follow-up Information     Mcarthur Rossetti, MD Follow up in 2 week(s).   Specialty: Orthopedic Surgery Contact information: [name] [address] [place], [place] Follow up.   Specialty: Home Health Services Why: [facility] will provide PT in the home after discharge. Contact information: [address] [place] [address] [phone]                  Signed: Newt Minion 11/24/2022, 10:13 AM

## 2022-11-24 NOTE — Progress Notes (Signed)
Patient discharged to home w/ family. Given all belongings, instructions, equipment. Verbalized understanding of all instructions. Verbalized understanding of all instructions. Escorted to pov via w/c.

## 2022-11-25 DIAGNOSIS — E669 Obesity, unspecified: Secondary | ICD-10-CM | POA: Diagnosis not present

## 2022-11-25 DIAGNOSIS — Z9181 History of falling: Secondary | ICD-10-CM | POA: Diagnosis not present

## 2022-11-25 DIAGNOSIS — I10 Essential (primary) hypertension: Secondary | ICD-10-CM | POA: Diagnosis not present

## 2022-11-25 DIAGNOSIS — Z793 Long term (current) use of hormonal contraceptives: Secondary | ICD-10-CM | POA: Diagnosis not present

## 2022-11-25 DIAGNOSIS — Z7982 Long term (current) use of aspirin: Secondary | ICD-10-CM | POA: Diagnosis not present

## 2022-11-25 DIAGNOSIS — D509 Iron deficiency anemia, unspecified: Secondary | ICD-10-CM | POA: Diagnosis not present

## 2022-11-25 DIAGNOSIS — M8580 Other specified disorders of bone density and structure, unspecified site: Secondary | ICD-10-CM | POA: Diagnosis not present

## 2022-11-25 DIAGNOSIS — E559 Vitamin D deficiency, unspecified: Secondary | ICD-10-CM | POA: Diagnosis not present

## 2022-11-25 DIAGNOSIS — K760 Fatty (change of) liver, not elsewhere classified: Secondary | ICD-10-CM | POA: Diagnosis not present

## 2022-11-25 DIAGNOSIS — Z96652 Presence of left artificial knee joint: Secondary | ICD-10-CM | POA: Diagnosis not present

## 2022-11-25 DIAGNOSIS — M5416 Radiculopathy, lumbar region: Secondary | ICD-10-CM | POA: Diagnosis not present

## 2022-11-25 DIAGNOSIS — E785 Hyperlipidemia, unspecified: Secondary | ICD-10-CM | POA: Diagnosis not present

## 2022-11-25 DIAGNOSIS — Z471 Aftercare following joint replacement surgery: Secondary | ICD-10-CM | POA: Diagnosis not present

## 2022-11-25 DIAGNOSIS — Z6833 Body mass index (BMI) 33.0-33.9, adult: Secondary | ICD-10-CM | POA: Diagnosis not present

## 2022-11-25 DIAGNOSIS — E039 Hypothyroidism, unspecified: Secondary | ICD-10-CM | POA: Diagnosis not present

## 2022-11-25 DIAGNOSIS — Z86010 Personal history of colonic polyps: Secondary | ICD-10-CM | POA: Diagnosis not present

## 2022-11-25 DIAGNOSIS — M159 Polyosteoarthritis, unspecified: Secondary | ICD-10-CM | POA: Diagnosis not present

## 2022-11-25 DIAGNOSIS — M4310 Spondylolisthesis, site unspecified: Secondary | ICD-10-CM | POA: Diagnosis not present

## 2022-11-26 ENCOUNTER — Telehealth: Payer: Self-pay | Admitting: Orthopaedic Surgery

## 2022-11-26 ENCOUNTER — Encounter (HOSPITAL_COMMUNITY): Payer: Self-pay | Admitting: Orthopaedic Surgery

## 2022-11-26 ENCOUNTER — Other Ambulatory Visit: Payer: Self-pay

## 2022-11-26 ENCOUNTER — Other Ambulatory Visit (HOSPITAL_COMMUNITY): Payer: Self-pay

## 2022-11-26 ENCOUNTER — Other Ambulatory Visit: Payer: Self-pay | Admitting: Orthopaedic Surgery

## 2022-11-26 MED ORDER — OXYCODONE HCL 5 MG PO TABS
5.0000 mg | ORAL_TABLET | ORAL | 0 refills | Status: DC | PRN
  Filled 2022-11-26: qty 30, 3d supply, fill #0

## 2022-11-26 NOTE — Telephone Encounter (Signed)
Patient aware this was sent in for her and states HHPT is going well

## 2022-11-26 NOTE — Telephone Encounter (Signed)
Patient called in stating she is unsure when she should go back to her previous pain medication  she was taking before the surgery which was aleve and she had a few concerns with home health PT

## 2022-11-27 DIAGNOSIS — M8580 Other specified disorders of bone density and structure, unspecified site: Secondary | ICD-10-CM | POA: Diagnosis not present

## 2022-11-27 DIAGNOSIS — M5416 Radiculopathy, lumbar region: Secondary | ICD-10-CM | POA: Diagnosis not present

## 2022-11-27 DIAGNOSIS — Z471 Aftercare following joint replacement surgery: Secondary | ICD-10-CM | POA: Diagnosis not present

## 2022-11-27 DIAGNOSIS — M4310 Spondylolisthesis, site unspecified: Secondary | ICD-10-CM | POA: Diagnosis not present

## 2022-11-27 DIAGNOSIS — M159 Polyosteoarthritis, unspecified: Secondary | ICD-10-CM | POA: Diagnosis not present

## 2022-11-27 DIAGNOSIS — D509 Iron deficiency anemia, unspecified: Secondary | ICD-10-CM | POA: Diagnosis not present

## 2022-11-28 ENCOUNTER — Other Ambulatory Visit (HOSPITAL_COMMUNITY): Payer: Self-pay

## 2022-11-29 DIAGNOSIS — M8580 Other specified disorders of bone density and structure, unspecified site: Secondary | ICD-10-CM | POA: Diagnosis not present

## 2022-11-29 DIAGNOSIS — M5416 Radiculopathy, lumbar region: Secondary | ICD-10-CM | POA: Diagnosis not present

## 2022-11-29 DIAGNOSIS — D509 Iron deficiency anemia, unspecified: Secondary | ICD-10-CM | POA: Diagnosis not present

## 2022-11-29 DIAGNOSIS — M159 Polyosteoarthritis, unspecified: Secondary | ICD-10-CM | POA: Diagnosis not present

## 2022-11-29 DIAGNOSIS — Z471 Aftercare following joint replacement surgery: Secondary | ICD-10-CM | POA: Diagnosis not present

## 2022-11-29 DIAGNOSIS — M4310 Spondylolisthesis, site unspecified: Secondary | ICD-10-CM | POA: Diagnosis not present

## 2022-12-02 DIAGNOSIS — M5416 Radiculopathy, lumbar region: Secondary | ICD-10-CM | POA: Diagnosis not present

## 2022-12-02 DIAGNOSIS — D509 Iron deficiency anemia, unspecified: Secondary | ICD-10-CM | POA: Diagnosis not present

## 2022-12-02 DIAGNOSIS — M4310 Spondylolisthesis, site unspecified: Secondary | ICD-10-CM | POA: Diagnosis not present

## 2022-12-02 DIAGNOSIS — M159 Polyosteoarthritis, unspecified: Secondary | ICD-10-CM | POA: Diagnosis not present

## 2022-12-02 DIAGNOSIS — Z471 Aftercare following joint replacement surgery: Secondary | ICD-10-CM | POA: Diagnosis not present

## 2022-12-02 DIAGNOSIS — M8580 Other specified disorders of bone density and structure, unspecified site: Secondary | ICD-10-CM | POA: Diagnosis not present

## 2022-12-04 DIAGNOSIS — M4310 Spondylolisthesis, site unspecified: Secondary | ICD-10-CM | POA: Diagnosis not present

## 2022-12-04 DIAGNOSIS — D509 Iron deficiency anemia, unspecified: Secondary | ICD-10-CM | POA: Diagnosis not present

## 2022-12-04 DIAGNOSIS — M5416 Radiculopathy, lumbar region: Secondary | ICD-10-CM | POA: Diagnosis not present

## 2022-12-04 DIAGNOSIS — M159 Polyosteoarthritis, unspecified: Secondary | ICD-10-CM | POA: Diagnosis not present

## 2022-12-04 DIAGNOSIS — M8580 Other specified disorders of bone density and structure, unspecified site: Secondary | ICD-10-CM | POA: Diagnosis not present

## 2022-12-04 DIAGNOSIS — Z471 Aftercare following joint replacement surgery: Secondary | ICD-10-CM | POA: Diagnosis not present

## 2022-12-05 ENCOUNTER — Other Ambulatory Visit: Payer: Self-pay

## 2022-12-05 ENCOUNTER — Encounter: Payer: Self-pay | Admitting: Orthopaedic Surgery

## 2022-12-05 ENCOUNTER — Ambulatory Visit (INDEPENDENT_AMBULATORY_CARE_PROVIDER_SITE_OTHER): Payer: Medicare Other | Admitting: Orthopaedic Surgery

## 2022-12-05 DIAGNOSIS — Z96652 Presence of left artificial knee joint: Secondary | ICD-10-CM

## 2022-12-05 NOTE — Progress Notes (Signed)
The patient is a 67 year old female who is here for her first postoperative visit status post a left total knee arthroplasty.  She is doing well overall.  She is on a baby aspirin twice a day and been alternating Aleve and Tylenol Exer strength.  She has not tolerated narcotics nor does she want these.  She has done excellent from home therapy standpoint.  Her extension is almost full and her flexion is to at least 90 degrees.  Her calf is soft today.  On exam her incision looks good and staples are removed.  We had a long and thorough discussion about her things to do and not do.  We will see if therapy can extend her at home for at least 1 more week while we set her up for outpatient physical therapy hopefully here.  All question concerns were answered and addressed.  Will see her back in 4 weeks.

## 2022-12-06 DIAGNOSIS — D509 Iron deficiency anemia, unspecified: Secondary | ICD-10-CM | POA: Diagnosis not present

## 2022-12-06 DIAGNOSIS — M159 Polyosteoarthritis, unspecified: Secondary | ICD-10-CM | POA: Diagnosis not present

## 2022-12-06 DIAGNOSIS — M8580 Other specified disorders of bone density and structure, unspecified site: Secondary | ICD-10-CM | POA: Diagnosis not present

## 2022-12-06 DIAGNOSIS — Z471 Aftercare following joint replacement surgery: Secondary | ICD-10-CM | POA: Diagnosis not present

## 2022-12-06 DIAGNOSIS — M5416 Radiculopathy, lumbar region: Secondary | ICD-10-CM | POA: Diagnosis not present

## 2022-12-06 DIAGNOSIS — M4310 Spondylolisthesis, site unspecified: Secondary | ICD-10-CM | POA: Diagnosis not present

## 2022-12-09 ENCOUNTER — Encounter: Payer: Self-pay | Admitting: Physical Therapy

## 2022-12-09 ENCOUNTER — Ambulatory Visit (INDEPENDENT_AMBULATORY_CARE_PROVIDER_SITE_OTHER): Payer: Medicare Other | Admitting: Physical Therapy

## 2022-12-09 ENCOUNTER — Other Ambulatory Visit: Payer: Self-pay

## 2022-12-09 DIAGNOSIS — M6281 Muscle weakness (generalized): Secondary | ICD-10-CM | POA: Diagnosis not present

## 2022-12-09 DIAGNOSIS — R600 Localized edema: Secondary | ICD-10-CM | POA: Diagnosis not present

## 2022-12-09 DIAGNOSIS — R2689 Other abnormalities of gait and mobility: Secondary | ICD-10-CM

## 2022-12-09 DIAGNOSIS — M25662 Stiffness of left knee, not elsewhere classified: Secondary | ICD-10-CM

## 2022-12-09 DIAGNOSIS — M25562 Pain in left knee: Secondary | ICD-10-CM

## 2022-12-09 NOTE — Therapy (Signed)
OUTPATIENT PHYSICAL THERAPY LOWER EXTREMITY EVALUATION   Patient Name: Joan [name] MRN: [id number] DOB:1956/08/28, 67 y.o., female Today's Date: 12/09/2022  END OF SESSION:  PT End of Session - 12/09/22 1116     Visit Number 1    Number of Visits 25    Date for PT Re-Evaluation 02/14/23    Authorization Type Medicare & [employer] sup    Progress Note Due on Visit 10    PT Start Time 1015    PT Stop Time 1103    PT Time Calculation (min) 48 min    Activity Tolerance Patient tolerated treatment well;Patient limited by pain    Behavior During Therapy WFL for tasks assessed/performed             Past Medical History:  Diagnosis Date   Allergy    Anemia    due to Dysmenorrhea   Arthritis    generalized    Hypertension    initially after BCP initiated    Hypothyroidism    PONV (postoperative nausea and vomiting)    Thyroid disease 2005 & 2009   nodule   Past Surgical History:  Procedure Laterality Date   ABDOMINAL HYSTERECTOMY  1998   for heavy menses and fibroids   BIOPSY THYROID  2005,2009   both negative   CESAREAN SECTION     x2   COLONOSCOPY  2009    Dr [facility]   EXPLORATORY LAPAROTOMY  1985   Ovarian cyst   [place]    LLQ pain due to ovarian cyst   SIGMOIDOSCOPY     TONSILLECTOMY     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   Procedure: LEFT TOTAL KNEE ARTHROPLASTY;  Surgeon: Mcarthur Rossetti, MD;  Location: [facility];  Service: Orthopedics;  Laterality: Left;   Patient Active Problem List   Diagnosis Date Noted   Status post total left knee replacement 11/22/2022   Unilateral primary osteoarthritis, left knee 09/25/2022   Degenerative arthritis of left knee 08/20/2021   Degenerative spondylolisthesis 03/31/2020   Lumbar radiculopathy 03/28/2020   Mid back pain on right side 09/28/2018   Osteopenia 08/19/2018   Hyperglycemia 07/09/2018   Hypothyroidism 06/30/2017   Gallbladder polyp 05/20/2016   Fatty  liver 05/20/2016   Obesity 05/01/2016   Vitamin D deficiency 07/14/2013   LOW BACK PAIN SYNDROME 01/23/2010   Status post radioactive iodine thyroid ablation 08/17/2009   Hyperlipidemia 07/26/2008   Essential hypertension 07/26/2008   Pain in joint of left shoulder 07/26/2008   Goiter 07/14/2007    PCP: [name], MD  REFERRING PROVIDER: Jean Rosenthal, MD  REFERRING DIAG: [phone] (ICD-10-CM) - Status post total left knee replacement   THERAPY DIAG:  Acute pain of left knee  Muscle weakness (generalized)  Stiffness of left knee, not elsewhere classified  Other abnormalities of gait and mobility  Localized edema  Rationale for Evaluation and Treatment: Rehabilitation  ONSET DATE: 11/22/2022 TKA sg  SUBJECTIVE:   SUBJECTIVE STATEMENT: This 67yo female underwent a left TKA surgery on 11/22/2022 due to OA. Her back pain has not changed.    PERTINENT HISTORY: OA, lumbar radiculopathy / LBP, osteopenia, obesity, s/p radioactive thyroid ablation, HTN  PAIN:  NPRS scale: 7/10 today and in last week 5/10 - 7/10 Pain location: Left knee more medial Pain description: sore Aggravating factors: sitting, increasing distance suddenly Relieving factors: laying down with knee ext,  ice & Aleve/tylenol   PRECAUTIONS: None  WEIGHT BEARING RESTRICTIONS: Yes WBAT LLE  FALLS:  Has patient fallen in last 6 months? 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   PATIENT SURVEYS:   FOTO intake: 38%   predicted:  60%  COGNITION: Overall cognitive status: WFL    SENSATION: WFL  EDEMA:  Circumferential:  LLE above knee 50.7cm  around knee 47cm  below knee 40cm RLE above knee 46.7cm  around knee 43.6cm  below knee 36 cm   POSTURE: flexed trunk  and weight shift right  PALPATION: Tenderness medial knee (quad, joint line & proximal tibia), lateral joint line & incision.   LOWER EXTREMITY ROM:   ROM P:passive  A:active Left eval  Hip flexion   Hip extension   Hip abduction   Hip adduction   Hip internal rotation   Hip external rotation   Knee flexion Seated A: 83* P: 94* Supine   Knee extension Seated  A: LAQ -13* Supine P: -8* A: -10*  Ankle dorsiflexion   Ankle plantarflexion   Ankle inversion   Ankle eversion    (Blank rows = not tested)  LOWER EXTREMITY MMT:  MMT Left eval  Hip flexion   Hip extension   Hip abduction   Hip adduction   Hip internal rotation   Hip external rotation   Knee flexion 3-/5  Knee extension 3-/5  Ankle dorsiflexion   Ankle plantarflexion   Ankle inversion   Ankle eversion    (Blank rows = not tested)   FUNCTIONAL TESTS:  18 inch chair transfer: requires use of BUEs on armrests, weight shift to RLE, limited knee flex  GAIT: Distance walked: 100' Assistive device utilized: Single point cane Level of assistance: SBA Comments: LLE stiff leg with flexion in stance & minimal increase knee flex for swing, antalgic with LLE decreased stance time, LLE abducted, trunk flexed   TODAY'S TREATMENT                                                                          DATE:   12/09/2022 Therex:    HEP instruction/performance c cues for techniques, handout provided.  Trial set performed of each for comprehension and symptom assessment.  See below for exercise list  PATIENT EDUCATION:  Education details: HEP, POC Person educated: Patient & husband Education method: Explanation, Demonstration, Verbal  cues, and Handouts Education comprehension: verbalized understanding, returned demonstration, and verbal cues required  HOME EXERCISE PROGRAM: Access Code: [facility] URL: [email or web address] Date: 12/09/2022 Prepared by: Jamey Reas  Exercises - Ankle Alphabet in Elevation  - 2-4 x daily - 7 x weekly - 1 sets - 1 reps - Quad Setting and Stretching  - 2-4 x daily - 7 x weekly - 5-10 sets - 10 reps - prop 5-10 minutes & quad set5 seconds hold -  Supine Heel Slide with Strap  - 2-3 x daily - 7 x weekly - 2-3 sets - 10 reps - 5 seconds hold - Supine Knee Extension Strengthening  - 2-3 x daily - 7 x weekly - 2-3 sets - 10 reps - 5 seconds hold - Supine Straight Leg Raises  - 2-3 x daily - 7 x weekly - 2-3 sets - 10 reps - 5 seconds hold - Seated Knee Flexion Extension AROM   - 2-4 x daily - 7 x weekly - 2-3 sets - 10 reps - 5 seconds hold - Seated Long Arc Quad  - 2-3 x daily - 7 x weekly - 2-3 sets - 10 reps - 5 seconds hold - Seated straight leg lifts  - 2-3 x daily - 7 x weekly - 2-3 sets - 10 reps - 5 seconds hold - Seated Hamstring Stretch with Strap  - 2-4 x daily - 7 x weekly - 1 sets - 3 reps - 20-30 seconds hold  ASSESSMENT:  CLINICAL IMPRESSION: Patient is a 67 y.o. who comes to clinic with complaints of left knee pain with mobility, strength and movement coordination deficits that impair their ability to perform usual daily and recreational functional activities without increase difficulty/symptoms at this time.  Patient to benefit from skilled PT services to address impairments and limitations to improve to previous level of function without restriction secondary to condition.   OBJECTIVE IMPAIRMENTS: Abnormal gait, decreased activity tolerance, decreased balance, decreased endurance, decreased knowledge of condition, decreased knowledge of use of DME, decreased mobility, difficulty walking, decreased ROM, decreased strength, increased edema, increased muscle spasms,  impaired flexibility, obesity, and pain.   ACTIVITY LIMITATIONS: carrying, lifting, bending, sitting, standing, squatting, sleeping, stairs, transfers, and locomotion level  PARTICIPATION LIMITATIONS: meal prep, cleaning, laundry, driving, and community activity  PERSONAL FACTORS: Fitness and 1-2 comorbidities: see PMH  are also affecting patient's functional outcome.   REHAB POTENTIAL: Good  CLINICAL DECISION MAKING: Stable/uncomplicated  EVALUATION COMPLEXITY: Low   GOALS: Goals reviewed with patient? Yes  SHORT TERM GOALS: (target date for Short term goals are 4 weeks 01/03/2023)   1.  Patient will demonstrate independent use of home exercise program to maintain progress from in clinic treatments.  Goal status: New  LONG TERM GOALS: (target dates for all long term goals are 10 weeks  02/14/2023 )   1. Patient will demonstrate/report pain at worst less than or equal to 2/10 to facilitate minimal limitation in daily activity secondary to pain symptoms.  Goal status: New   2. Patient will demonstrate independent use of home exercise program to facilitate ability to maintain/progress functional gains from skilled physical therapy services.  Goal status: New   3. Patient will demonstrate FOTO outcome > or = 60 % to indicate reduced disability due to condition.  Goal status: New   4.  Patient will demonstrate LE MMT 5/5 throughout to faciltiate usual transfers, stairs, squatting at [facility] for daily life.   Goal status: New   5.  Patient ambulates >500' & negotiates ramps, curbs & stairs single rail without assistive device independently. Goal status: New     PLAN:  PT FREQUENCY: 2-3x/week  PT DURATION: 10 weeks  PLANNED INTERVENTIONS: Therapeutic exercises, Therapeutic activity, Neuromuscular re-education, Balance training, Gait training, Patient/Family education, Self Care, Joint mobilization, Stair training, DME instructions, Electrical stimulation, Cryotherapy, Moist  heat, scar mobilization, Taping, Vasopneumatic device, Ultrasound, Ionotophoresis '4mg'$ /ml Dexamethasone, and Manual therapy  PLAN FOR NEXT SESSION: review & update HEP, Manual therapy &  there exer for range, vaso to end   Jamey Reas, PT, DPT 12/09/2022, 11:31 AM

## 2022-12-10 ENCOUNTER — Encounter: Payer: Self-pay | Admitting: Physical Therapy

## 2022-12-10 ENCOUNTER — Ambulatory Visit (INDEPENDENT_AMBULATORY_CARE_PROVIDER_SITE_OTHER): Payer: Medicare Other | Admitting: Physical Therapy

## 2022-12-10 DIAGNOSIS — M25562 Pain in left knee: Secondary | ICD-10-CM | POA: Diagnosis not present

## 2022-12-10 DIAGNOSIS — M25662 Stiffness of left knee, not elsewhere classified: Secondary | ICD-10-CM

## 2022-12-10 DIAGNOSIS — M6281 Muscle weakness (generalized): Secondary | ICD-10-CM | POA: Diagnosis not present

## 2022-12-10 DIAGNOSIS — R2689 Other abnormalities of gait and mobility: Secondary | ICD-10-CM | POA: Diagnosis not present

## 2022-12-10 DIAGNOSIS — R600 Localized edema: Secondary | ICD-10-CM | POA: Diagnosis not present

## 2022-12-10 NOTE — Therapy (Signed)
OUTPATIENT PHYSICAL THERAPY TREATMENT NOTE   Patient Name: Joan [name] MRN: [id number] DOB:[birth year], 67 y.o., female Today's Date: 12/10/2022  END OF SESSION:  PT End of Session - 12/10/22 1246     Visit Number 2    Number of Visits 25    Date for PT Re-Evaluation 02/14/23    Authorization Type Medicare & [employer] sup    Progress Note Due on Visit 10    PT Start Time 1145    PT Stop Time 1230    PT Time Calculation (min) 45 min    Equipment Utilized During Treatment Gait belt    Activity Tolerance Patient tolerated treatment well;Patient limited by pain    Behavior During Therapy WFL for tasks assessed/performed             Past Medical History:  Diagnosis Date   Allergy    Anemia    due to Dysmenorrhea   Arthritis    generalized    Hypertension    initially after BCP initiated    Hypothyroidism    PONV (postoperative nausea and vomiting)    Thyroid disease 2005 & 2009   nodule   Past Surgical History:  Procedure Laterality Date   ABDOMINAL HYSTERECTOMY  1998   for heavy menses and fibroids   BIOPSY THYROID  2005,2009   both negative   CESAREAN SECTION     x2   COLONOSCOPY  2009    Dr [facility]   EXPLORATORY LAPAROTOMY  1985   Ovarian cyst   [place]    LLQ pain due to ovarian cyst   SIGMOIDOSCOPY     TONSILLECTOMY     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   Procedure: LEFT TOTAL KNEE ARTHROPLASTY;  Surgeon: Mcarthur Rossetti, MD;  Location: [facility];  Service: Orthopedics;  Laterality: Left;   Patient Active Problem List   Diagnosis Date Noted   Status post total left knee replacement 11/22/2022   Unilateral primary osteoarthritis, left knee 09/25/2022   Degenerative arthritis of left knee 08/20/2021   Degenerative spondylolisthesis 03/31/2020   Lumbar radiculopathy 03/28/2020   Mid back pain on right side 09/28/2018   Osteopenia 08/19/2018   Hyperglycemia 07/09/2018   Hypothyroidism 06/30/2017    Gallbladder polyp 05/20/2016   Fatty liver 05/20/2016   Obesity 05/01/2016   Vitamin D deficiency 07/14/2013   LOW BACK PAIN SYNDROME 01/23/2010   Status post radioactive iodine thyroid ablation 08/17/2009   Hyperlipidemia 07/26/2008   Essential hypertension 07/26/2008   Pain in joint of left shoulder 07/26/2008   Goiter 07/14/2007    PCP: [name], MD  REFERRING PROVIDER: Jean Rosenthal, MD  REFERRING DIAG: [id number] (ICD-10-CM) - Status post total left knee replacement   THERAPY DIAG:  Acute pain of left knee  Muscle weakness (generalized)  Stiffness of left knee, not elsewhere classified  Other abnormalities of gait and mobility  Localized edema  Rationale for Evaluation and Treatment: Rehabilitation  ONSET DATE: 11/22/2022 TKA sg  SUBJECTIVE:   SUBJECTIVE STATEMENT: Pt arriving today reporting 7/10 pain in her left knee. Pt stating she was in the car yesterday for a few hours with her knee in a dependent position which she feels may have aggravated her knee.   PERTINENT HISTORY: OA, lumbar radiculopathy / LBP, osteopenia, obesity, s/p radioactive thyroid ablation, HTN  PAIN:  NPRS scale: 7/10  Pain location: Left knee more medial Pain description: sore Aggravating factors: sitting, increasing distance suddenly Relieving factors: laying down  with knee ext,  ice & Aleve/tylenol   PRECAUTIONS: None  WEIGHT BEARING RESTRICTIONS: Yes WBAT LLE  FALLS:  Has patient fallen in last 6 months? 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   PATIENT SURVEYS:  FOTO intake: 38%   predicted:  60%  COGNITION: Overall cognitive status: WFL    SENSATION: WFL  EDEMA:  Circumferential:  LLE above knee 50.7cm  around knee 47cm  below knee 40cm RLE above knee 46.7cm  around knee 43.6cm  below knee 36 cm   POSTURE: flexed trunk  and weight shift right  PALPATION: Tenderness medial knee (quad, joint line & proximal tibia), lateral joint line & incision.   LOWER EXTREMITY ROM:   ROM P:passive  A:active Left eval Left 12/10/12  Hip flexion    Hip extension    Hip abduction    Hip adduction    Hip internal rotation    Hip external rotation    Knee flexion Seated A: 83* P: 94* Supine  Supine A: 90 P: 95  Knee extension Seated  A: LAQ -13* Supine P: -8* A: -10* Supine A: -8 P: -4  Ankle dorsiflexion    Ankle plantarflexion    Ankle inversion    Ankle eversion     (Blank rows = not tested)  LOWER EXTREMITY MMT:  MMT Left eval  Hip flexion   Hip extension   Hip abduction   Hip adduction   Hip internal rotation   Hip external rotation   Knee flexion 3-/5  Knee extension 3-/5  Ankle dorsiflexion   Ankle plantarflexion   Ankle inversion   Ankle eversion    (Blank rows = not tested)   FUNCTIONAL TESTS:  18 inch chair transfer: requires use of BUEs on armrests, weight shift to RLE, limited knee flex  GAIT: Distance walked: 100' Assistive device utilized: Single point cane Level of assistance: SBA Comments: LLE stiff leg with flexion in stance & minimal increase knee flex for swing, antalgic with LLE decreased stance time, LLE abducted, trunk flexed   TODAY'S TREATMENT                                                                          DATE:  12/10/22:  TherEx:  Nustep: Level 4 x 8 minutes c UE support Standing heel raises: x 20 c UE support Standing left knee flexion: x 15 c UE support Sit to stand: x 10 c UE  support LAQ: 3# x 15 Supine SAQ x 15 c 3 # weight  Supine: SLR: 2 x 10 Supine heeel slides: x 10 Manual PROM: knee flexion and extension with overpressure Modalities:  Vasopneumatic 34 degrees, moderate compression x 10 minutes      12/09/2022 Therex:  HEP instruction/performance c cues for techniques, handout provided.  Trial set performed of each for comprehension and symptom assessment.  See below for exercise list  PATIENT EDUCATION:  Education details: HEP, POC Person educated: Patient & husband Education method: Explanation, Demonstration, Verbal cues, and Handouts Education comprehension: verbalized understanding, returned demonstration, and verbal cues required  HOME EXERCISE PROGRAM: Access Code: [facility] URL: [email or web address] Date: 12/09/2022 Prepared by: Jamey Reas  Exercises - Ankle Alphabet in Elevation  - 2-4 x daily - 7 x weekly - 1 sets - 1 reps - Quad Setting and Stretching  - 2-4 x daily - 7 x weekly - 5-10 sets - 10 reps - prop 5-10 minutes & quad set5 seconds hold - Supine Heel Slide with Strap  - 2-3 x daily - 7 x weekly - 2-3 sets - 10 reps - 5 seconds hold - Supine Knee Extension Strengthening  - 2-3 x daily - 7 x weekly - 2-3 sets - 10 reps - 5 seconds hold - Supine Straight Leg Raises  - 2-3 x daily - 7 x weekly - 2-3 sets - 10 reps - 5 seconds hold - Seated Knee Flexion Extension AROM   - 2-4 x daily - 7 x weekly - 2-3 sets - 10 reps - 5 seconds hold - Seated Long Arc Quad  - 2-3 x daily - 7 x weekly - 2-3 sets - 10 reps - 5 seconds hold - Seated straight leg lifts  - 2-3 x daily - 7 x weekly - 2-3 sets - 10 reps - 5 seconds hold - Seated Hamstring Stretch with Strap  - 2-4 x daily - 7 x weekly - 1 sets - 3 reps - 20-30 seconds hold  ASSESSMENT:  CLINICAL IMPRESSION: Pt arriving today reporting 7/10 pain in her left knee. Pt with improvements in her active ROM since yesterday. Pt reporting compliance in her HEP. Pt instructed  in alternative sleeping positions using pillows to support her left LE. Pt tolerating all exercises well. Continue with skilled PT interventions.   OBJECTIVE IMPAIRMENTS: Abnormal gait, decreased activity tolerance, decreased balance, decreased endurance, decreased knowledge of condition, decreased knowledge of use of DME, decreased mobility, difficulty walking, decreased ROM, decreased strength, increased edema, increased muscle spasms, impaired flexibility, obesity, and pain.   ACTIVITY LIMITATIONS: carrying, lifting, bending, sitting, standing, squatting, sleeping, stairs, transfers, and locomotion level  PARTICIPATION LIMITATIONS: meal prep, cleaning, laundry, driving, and community activity  PERSONAL FACTORS: Fitness and 1-2 comorbidities: see PMH  are also affecting patient's functional outcome.   REHAB POTENTIAL: Good  CLINICAL DECISION MAKING: Stable/uncomplicated  EVALUATION COMPLEXITY: Low   GOALS: Goals reviewed with patient? Yes  SHORT TERM GOALS: (target date for Short term goals are 4 weeks 01/03/2023)   1.  Patient will demonstrate independent use of home exercise program to maintain progress from in clinic treatments.  Goal status: New  LONG TERM GOALS: (target dates for all long term goals are 10 weeks  02/14/2023 )   1. Patient will demonstrate/report pain at worst less than or equal to 2/10 to facilitate minimal limitation in daily activity secondary to pain symptoms.  Goal status: New   2. Patient will demonstrate independent use of home exercise program to facilitate ability to maintain/progress functional gains from skilled physical therapy services.  Goal status: New   3. Patient will demonstrate FOTO outcome > or = 60 % to indicate reduced disability due to condition.  Goal status: New   4.  Patient will demonstrate LE MMT  5/5 throughout to faciltiate usual transfers, stairs, squatting at [facility] for daily life.   Goal status: New   5.  Patient ambulates  >500' & negotiates ramps, curbs & stairs single rail without assistive device independently. Goal status: New     PLAN:  PT FREQUENCY: 2-3x/week  PT DURATION: 10 weeks  PLANNED INTERVENTIONS: Therapeutic exercises, Therapeutic activity, Neuromuscular re-education, Balance training, Gait training, Patient/Family education, Self Care, Joint mobilization, Stair training, DME instructions, Electrical stimulation, Cryotherapy, Moist heat, scar mobilization, Taping, Vasopneumatic device, Ultrasound, Ionotophoresis '4mg'$ /ml Dexamethasone, and Manual therapy  PLAN FOR NEXT SESSION: Manual therapy & there exer for range, vaso to end   Kearney Hard, PT, MPT 12/10/22 12:47 PM   12/10/2022, 12:47 PM

## 2022-12-11 ENCOUNTER — Encounter: Payer: Self-pay | Admitting: Physical Therapy

## 2022-12-11 ENCOUNTER — Ambulatory Visit (INDEPENDENT_AMBULATORY_CARE_PROVIDER_SITE_OTHER): Payer: Medicare Other | Admitting: Physical Therapy

## 2022-12-11 DIAGNOSIS — R600 Localized edema: Secondary | ICD-10-CM

## 2022-12-11 DIAGNOSIS — M6281 Muscle weakness (generalized): Secondary | ICD-10-CM

## 2022-12-11 DIAGNOSIS — R2689 Other abnormalities of gait and mobility: Secondary | ICD-10-CM | POA: Diagnosis not present

## 2022-12-11 DIAGNOSIS — M25562 Pain in left knee: Secondary | ICD-10-CM

## 2022-12-11 DIAGNOSIS — M25662 Stiffness of left knee, not elsewhere classified: Secondary | ICD-10-CM | POA: Diagnosis not present

## 2022-12-11 NOTE — Therapy (Signed)
OUTPATIENT PHYSICAL THERAPY TREATMENT NOTE   Patient Name: Noni Stonesifer MRN: [id number] DOB:[birth year], 67 y.o., female Today's Date: 12/11/2022  END OF SESSION:  PT End of Session - 12/11/22 1307     Visit Number 3    Number of Visits 25    Date for PT Re-Evaluation 02/14/23    Authorization Type Medicare & [employer] sup    Progress Note Due on Visit 10    PT Start Time 1302    PT Stop Time 1355    PT Time Calculation (min) 53 min    Equipment Utilized During Treatment Gait belt    Activity Tolerance Patient tolerated treatment well;Patient limited by pain    Behavior During Therapy WFL for tasks assessed/performed              Past Medical History:  Diagnosis Date   Allergy    Anemia    due to Dysmenorrhea   Arthritis    generalized    Hypertension    initially after BCP initiated    Hypothyroidism    PONV (postoperative nausea and vomiting)    Thyroid disease 2005 & 2009   nodule   Past Surgical History:  Procedure Laterality Date   ABDOMINAL HYSTERECTOMY  1998   for heavy menses and fibroids   BIOPSY THYROID  2005,2009   both negative   CESAREAN SECTION     x2   COLONOSCOPY  2009    Dr [facility]   EXPLORATORY LAPAROTOMY  1985   Ovarian cyst   [place]    LLQ pain due to ovarian cyst   SIGMOIDOSCOPY     TONSILLECTOMY     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   Procedure: LEFT TOTAL KNEE ARTHROPLASTY;  Surgeon: Mcarthur Rossetti, MD;  Location: [facility];  Service: Orthopedics;  Laterality: Left;   Patient Active Problem List   Diagnosis Date Noted   Status post total left knee replacement 11/22/2022   Unilateral primary osteoarthritis, left knee 09/25/2022   Degenerative arthritis of left knee 08/20/2021   Degenerative spondylolisthesis 03/31/2020   Lumbar radiculopathy 03/28/2020   Mid back pain on right side 09/28/2018   Osteopenia 08/19/2018   Hyperglycemia 07/09/2018   Hypothyroidism 06/30/2017    Gallbladder polyp 05/20/2016   Fatty liver 05/20/2016   Obesity 05/01/2016   Vitamin D deficiency 07/14/2013   LOW BACK PAIN SYNDROME 01/23/2010   Status post radioactive iodine thyroid ablation 08/17/2009   Hyperlipidemia 07/26/2008   Essential hypertension 07/26/2008   Pain in joint of left shoulder 07/26/2008   Goiter 07/14/2007    PCP: [name], MD  REFERRING PROVIDER: Jean Rosenthal, MD  REFERRING DIAG: [phone] (ICD-10-CM) - Status post total left knee replacement   THERAPY DIAG:  Acute pain of left knee  Muscle weakness (generalized)  Stiffness of left knee, not elsewhere classified  Other abnormalities of gait and mobility  Localized edema  Rationale for Evaluation and Treatment: Rehabilitation  ONSET DATE: 11/22/2022 TKA sg  SUBJECTIVE:   SUBJECTIVE STATEMENT: Her knee continues to hurt & limits mobility.  She has been elevating as recommended.  She got thigh high TED hose for left leg.   PERTINENT HISTORY: OA, lumbar radiculopathy / LBP, osteopenia, obesity, s/p radioactive thyroid ablation, HTN  PAIN:  NPRS scale:  7/10 and since starting PT  5/10 - 7/10 Pain location: Left knee more medial Pain description: sore Aggravating factors: sitting, increasing distance suddenly Relieving factors: laying down with knee ext,  ice & Aleve/tylenol   PRECAUTIONS: None  WEIGHT BEARING RESTRICTIONS: Yes WBAT LLE  FALLS:  Has patient fallen in last 6 months? 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   PATIENT SURVEYS:  FOTO intake: 38%   predicted:  60%  COGNITION: Overall cognitive status: WFL    SENSATION: WFL  EDEMA:  Circumferential:  LLE above knee 50.7cm  around knee 47cm  below knee 40cm RLE above knee 46.7cm  around knee 43.6cm  below knee 36 cm   POSTURE: flexed trunk  and weight shift right  PALPATION: Tenderness medial knee (quad, joint line & proximal tibia), lateral joint line & incision.   LOWER EXTREMITY ROM:   ROM P:passive  A:active Left eval Left 12/10/12  Hip flexion    Hip extension    Hip abduction    Hip adduction    Hip internal rotation    Hip external rotation    Knee flexion Seated A: 83* P: 94* Supine  Supine A: 90 P: 95  Knee extension Seated  A: LAQ -13* Supine P: -8* A: -10* Supine A: -8 P: -4  Ankle dorsiflexion    Ankle plantarflexion    Ankle inversion    Ankle eversion     (Blank rows = not tested)  LOWER EXTREMITY MMT:  MMT Left eval  Hip flexion   Hip extension   Hip abduction   Hip adduction   Hip internal rotation   Hip external rotation   Knee flexion 3-/5  Knee extension 3-/5  Ankle dorsiflexion   Ankle plantarflexion   Ankle inversion   Ankle eversion    (Blank rows = not tested)   FUNCTIONAL TESTS:  18 inch chair transfer: requires use of BUEs on armrests, weight shift to RLE, limited knee flex  GAIT: Distance walked: 100' Assistive device utilized: Single point cane Level of assistance: SBA Comments: LLE stiff leg with flexion in stance & minimal increase knee flex for swing, antalgic with LLE decreased stance time, LLE abducted, trunk flexed   TODAY'S TREATMENT                                                                          DATE:  12/11/2022: TherEx:  Nustep: seat 8 Level 5 x 8 minutes c BLEs / BUEs Sit to stand: x 10 c UE support  PT demo & verbal cues on technique. Pt return demo.  LAQ & active knee flexion with contralateral LE  opposing motion: 1 min without weight, then LLE 3# x 15 Supine quad set 5 sec hold 10 reps Supine heeel slides with LLE on red / 65cm ball with strap assist: x 20  Therapeutic Activities: Pre-gait: at counter terminal stance knee flexion stepping over 2" high X  10" long step and wt shift onto LLE in stance for 2 min.  Carryover knee flex terminal stance / swing & ext in stance into gait with cane.  Pt able to return demo with verbal cues.  PT instructed to weight shift onto LLE for 2-3 sec 5+ reps upon arising prior to ambulating.  Pt performed during session with noted decrease in pain spike.  Due to pain, pt recommending cane in home & RW in community for next few days.  She has balance & strength to amb with cane but pain response could limit progression.  Tandem stance on floor with LLE in front & in back 30 sec ea  Manual PROM: knee flexion and extension with overpressure  Contract - relax  Modalities:  Vasopneumatic 34 degrees, moderate compression x 10 minutes   12/10/22:  TherEx:  Nustep: Level 4 x 8 minutes c UE support Standing heel raises: x 20 c UE support Standing left knee flexion: x 15 c UE support Sit to stand: x 10 c UE support LAQ: 3# x 15 Supine SAQ x 15 c 3 # weight  Supine: SLR: 2 x 10 Supine heeel slides: x 10 Manual PROM: knee flexion and extension with overpressure Modalities:  Vasopneumatic 34 degrees, moderate compression x 10 minutes    12/09/2022 Therex:    HEP instruction/performance c cues for techniques, handout provided.  Trial set performed of each for comprehension and symptom assessment.  See below for exercise list  PATIENT EDUCATION:  Education details: HEP, POC Person educated: Patient & husband Education method: Explanation, Demonstration, Verbal cues, and Handouts Education comprehension: verbalized understanding, returned demonstration, and verbal cues required  HOME EXERCISE PROGRAM: Access Code: [facility] URL:  [email or web address] Date: 12/09/2022 Prepared by: Jamey Reas  Exercises - Ankle Alphabet in Elevation  - 2-4 x daily - 7 x weekly - 1 sets - 1 reps - Quad Setting and Stretching  - 2-4 x daily - 7 x weekly - 5-10 sets - 10 reps - prop 5-10 minutes & quad set5 seconds hold - Supine Heel Slide with Strap  - 2-3 x daily - 7 x weekly - 2-3 sets - 10 reps - 5 seconds hold - Supine Knee Extension Strengthening  - 2-3 x daily - 7 x weekly - 2-3 sets - 10 reps - 5 seconds hold - Supine Straight Leg Raises  - 2-3 x daily - 7 x weekly - 2-3 sets - 10 reps - 5 seconds hold - Seated Knee Flexion Extension AROM   - 2-4 x daily - 7 x weekly - 2-3 sets - 10 reps - 5 seconds hold - Seated Long Arc Quad  - 2-3 x daily - 7 x weekly - 2-3 sets - 10 reps - 5 seconds hold - Seated straight leg lifts  - 2-3 x daily - 7 x weekly - 2-3 sets - 10 reps - 5 seconds hold - Seated Hamstring Stretch with Strap  - 2-4 x daily - 7 x weekly - 1 sets - 3 reps - 20-30 seconds hold  ASSESSMENT:  CLINICAL IMPRESSION: Patient improved gait & sit/stand with PT instructions.  PT recommending using RW outside home due to pain for next few days.  Pt responds well with manual therapy & exercise.   OBJECTIVE IMPAIRMENTS: Abnormal gait, decreased activity tolerance, decreased balance, decreased endurance, decreased knowledge of condition, decreased knowledge of use of DME, decreased mobility, difficulty walking, decreased ROM, decreased strength, increased edema, increased muscle spasms, impaired flexibility, obesity, and pain.  ACTIVITY LIMITATIONS: carrying, lifting, bending, sitting, standing, squatting, sleeping, stairs, transfers, and locomotion level  PARTICIPATION LIMITATIONS: meal prep, cleaning, laundry, driving, and community activity  PERSONAL FACTORS: Fitness and 1-2 comorbidities: see PMH  are also affecting patient's functional outcome.   REHAB POTENTIAL: Good  CLINICAL DECISION MAKING:  Stable/uncomplicated  EVALUATION COMPLEXITY: Low   GOALS: Goals reviewed with patient? Yes  SHORT TERM GOALS: (target date for Short term goals are 4 weeks 01/03/2023)   1.  Patient will demonstrate independent use of home exercise program to maintain progress from in clinic treatments.  Goal status: New  LONG TERM GOALS: (target dates for all long term goals are 10 weeks  02/14/2023 )   1. Patient will demonstrate/report pain at worst less than or equal to 2/10 to facilitate minimal limitation in daily activity secondary to pain symptoms.  Goal status: New   2. Patient will demonstrate independent use of home exercise program to facilitate ability to maintain/progress functional gains from skilled physical therapy services.  Goal status: New   3. Patient will demonstrate FOTO outcome > or = 60 % to indicate reduced disability due to condition.  Goal status: New   4.  Patient will demonstrate LE MMT 5/5 throughout to faciltiate usual transfers, stairs, squatting at [facility] for daily life.   Goal status: New   5.  Patient ambulates >500' & negotiates ramps, curbs & stairs single rail without assistive device independently. Goal status: New     PLAN:  PT FREQUENCY: 2-3x/week  PT DURATION: 10 weeks  PLANNED INTERVENTIONS: Therapeutic exercises, Therapeutic activity, Neuromuscular re-education, Balance training, Gait training, Patient/Family education, Self Care, Joint mobilization, Stair training, DME instructions, Electrical stimulation, Cryotherapy, Moist heat, scar mobilization, Taping, Vasopneumatic device, Ultrasound, Ionotophoresis '4mg'$ /ml Dexamethasone, and Manual therapy  PLAN FOR NEXT SESSION:  continue Manual therapy & there exer for range, add standing balance & functional activities.  vaso to end   Jamey Reas, PT, DPT 12/11/2022, 3:09 PM

## 2022-12-16 ENCOUNTER — Ambulatory Visit (INDEPENDENT_AMBULATORY_CARE_PROVIDER_SITE_OTHER): Payer: Medicare Other | Admitting: Physical Therapy

## 2022-12-16 ENCOUNTER — Encounter: Payer: Self-pay | Admitting: Physical Therapy

## 2022-12-16 DIAGNOSIS — R600 Localized edema: Secondary | ICD-10-CM

## 2022-12-16 DIAGNOSIS — R252 Cramp and spasm: Secondary | ICD-10-CM | POA: Diagnosis not present

## 2022-12-16 DIAGNOSIS — M25562 Pain in left knee: Secondary | ICD-10-CM | POA: Diagnosis not present

## 2022-12-16 DIAGNOSIS — M25662 Stiffness of left knee, not elsewhere classified: Secondary | ICD-10-CM

## 2022-12-16 NOTE — Therapy (Signed)
OUTPATIENT PHYSICAL THERAPY TREATMENT NOTE   Patient Name: Joan [name] MRN: [id number] DOB:03-Jun-1956, 67 y.o., female Today's Date: 12/16/2022  END OF SESSION:  PT End of Session - 12/16/22 1025     Visit Number 4    Number of Visits 25    Date for PT Re-Evaluation 02/14/23    Authorization Type Medicare & [employer] sup    PT Start Time 1015    PT Stop Time 1100    PT Time Calculation (min) 45 min    Activity Tolerance Patient tolerated treatment well;Patient limited by pain    Behavior During Therapy [facility] for tasks assessed/performed               Past Medical History:  Diagnosis Date   Allergy    Anemia    due to Dysmenorrhea   Arthritis    generalized    Hypertension    initially after BCP initiated    Hypothyroidism    PONV (postoperative nausea and vomiting)    Thyroid disease 2005 & 2009   nodule   Past Surgical History:  Procedure Laterality Date   ABDOMINAL HYSTERECTOMY  1998   for heavy menses and fibroids   BIOPSY THYROID  2005,2009   both negative   CESAREAN SECTION     x2   COLONOSCOPY  2009    Dr Annet[facility]   EXPLORATORY LAPAROTOMY  1985   Ovarian cyst   [place]    LLQ pain due to ovarian cyst   SIGMOIDOSCOPY     TONSILLECTOMY     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   Procedure: LEFT TOTAL KNEE ARTHROPLASTY;  Surgeon: Mcarthur Rossetti, MD;  Location: [facility];  Service: Orthopedics;  Laterality: Left;   Patient Active Problem List   Diagnosis Date Noted   Status post total left knee replacement 11/22/2022   Unilateral primary osteoarthritis, left knee 09/25/2022   Degenerative arthritis of left knee 08/20/2021   Degenerative spondylolisthesis 03/31/2020   Lumbar radiculopathy 03/28/2020   Mid back pain on right side 09/28/2018   Osteopenia 08/19/2018   Hyperglycemia 07/09/2018   Hypothyroidism 06/30/2017   Gallbladder polyp 05/20/2016   Fatty liver 05/20/2016   Obesity 05/01/2016    Vitamin D deficiency 07/14/2013   LOW BACK PAIN SYNDROME 01/23/2010   Status post radioactive iodine thyroid ablation 08/17/2009   Hyperlipidemia 07/26/2008   Essential hypertension 07/26/2008   Pain in joint of left shoulder 07/26/2008   Goiter 07/14/2007    PCP: [name], MD  REFERRING PROVIDER: Jean Rosenthal, MD  REFERRING DIAG: [phone] (ICD-10-CM) - Status post total left knee replacement   THERAPY DIAG:  Acute pain of left knee  Stiffness of left knee, not elsewhere classified  Localized edema  Cramp and spasm  Rationale for Evaluation and Treatment: Rehabilitation  ONSET DATE: 11/22/2022 TKA sg  SUBJECTIVE:   SUBJECTIVE STATEMENT: Pt stating she has been wearing her thigh high TED hose but she feels like they restrict her movements. Pt still reporting pain with and difficulty sleeping.   PERTINENT HISTORY: OA, lumbar radiculopathy / LBP, osteopenia, obesity, s/p radioactive thyroid ablation, HTN  PAIN:  NPRS scale:  4-5/10 pain upon arrival. Up to 7-8/10 at times Pain location: Left knee more medial Pain description: sore Aggravating factors: sitting, increasing distance suddenly Relieving factors: laying down with knee ext,  ice & Aleve/tylenol   PRECAUTIONS: None  WEIGHT BEARING RESTRICTIONS: Yes WBAT LLE  FALLS:  Has patient fallen in  last 6 months? 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   PATIENT SURVEYS:  FOTO intake: 38%   predicted:   60%  COGNITION: Overall cognitive status: WFL    SENSATION: WFL  EDEMA:  Circumferential:  LLE above knee 50.7cm  around knee 47cm  below knee 40cm RLE above knee 46.7cm  around knee 43.6cm  below knee 36 cm   POSTURE: flexed trunk  and weight shift right  PALPATION: Tenderness medial knee (quad, joint line & proximal tibia), lateral joint line & incision.   LOWER EXTREMITY ROM:   ROM P:passive  A:active Left eval Left 12/10/12 Left 12/16/22  Hip flexion     Hip extension     Hip abduction     Hip adduction     Hip internal rotation     Hip external rotation     Knee flexion Seated A: 83* P: 94* Supine  Supine A: 90 P: 95 Supine A: 94 P: 98  Knee extension Seated  A: LAQ -13* Supine P: -8* A: -10* Supine A: -8 P: -4 Supine A: -8 P: -4  Ankle dorsiflexion     Ankle plantarflexion     Ankle inversion     Ankle eversion      (Blank rows = not tested)  LOWER EXTREMITY MMT:  MMT Left eval  Hip flexion   Hip extension   Hip abduction   Hip adduction   Hip internal rotation   Hip external rotation   Knee flexion 3-/5  Knee extension 3-/5  Ankle dorsiflexion   Ankle plantarflexion   Ankle inversion   Ankle eversion    (Blank rows = not tested)   FUNCTIONAL TESTS:  18 inch chair transfer: requires use of BUEs on armrests, weight shift to RLE, limited knee flex  GAIT: Distance walked: 100' Assistive device utilized: Single point cane Level of assistance: SBA Comments: LLE stiff leg with flexion in stance & minimal increase knee flex for swing, antalgic with LLE decreased stance time, LLE abducted, trunk flexed   TODAY'S TREATMENT                                                                          DATE:  12/16/2022: TherEx:  Nustep: seat 8 Level 5 x 8 minutes c BLEs / BUEs Calf stretch on slant board: x 2 holding 30 sec Calf raises x 20 c UE support Step ups on 6 inch step: x 15 single UE support Leg Press: bil LE's 62# 3 x 10 Leg  Press: Left LE only 31# 2 x 10 Supine heeel slides x 10 Supine SLR: x 15 Left LE  Manual PROM: knee flexion and extension with overpressure  Modalities:  Vasopneumatic: x 10 minutes, medium compression 34 degrees,  Left LE    12/11/2022: TherEx:  Nustep: seat 8 Level 5 x 8 minutes c BLEs / BUEs Sit to stand: x 10 c UE support  PT demo & verbal cues on technique. Pt return demo.  LAQ & active knee flexion with contralateral LE opposing motion: 1 min without weight, then LLE 3# x 15 Supine quad set 5 sec hold 10 reps Supine heeel slides with LLE on red / 65cm ball with strap assist: x 20  Therapeutic Activities: Pre-gait: at counter terminal stance knee flexion stepping over 2" high X 10" long step and wt shift onto LLE in stance for 2 min.  Carryover knee flex terminal stance / swing & ext in stance into gait with cane.  Pt able to return demo with verbal cues.  PT instructed to weight shift onto LLE for 2-3 sec 5+ reps upon arising prior to ambulating.  Pt performed during session with noted decrease in pain spike.  Due to pain, pt recommending cane in home & RW in community for next few days.  She has balance & strength to amb with cane but pain response could limit progression.  Tandem stance on floor with LLE in front & in back 30 sec ea  Manual PROM: knee flexion and extension with overpressure  Contract - relax  Modalities:  Vasopneumatic 34 degrees, moderate compression x 10 minutes   12/10/22:  TherEx:  Nustep: Level 4 x 8 minutes c UE support Standing heel raises: x 20 c UE support Standing left knee flexion: x 15 c UE support Sit to stand: x 10 c UE support LAQ: 3# x 15 Supine SAQ x 15 c 3 # weight  Supine: SLR: 2 x 10 Supine heeel slides: x 10 Manual PROM: knee flexion and extension with overpressure Modalities:  Vasopneumatic 34 degrees, moderate compression x 10 minutes     PATIENT EDUCATION:  Education details: HEP, POC Person educated: Patient &  husband Education method: Consulting civil engineer, Media planner, Verbal cues, and Handouts Education comprehension: verbalized understanding, returned demonstration, and verbal cues required  HOME EXERCISE PROGRAM: Access Code: [facility] URL: [email or web address] Date: 12/09/2022 Prepared by: Jamey Reas  Exercises - Ankle Alphabet in Elevation  - 2-4 x daily - 7 x weekly - 1 sets - 1 reps - Quad Setting and Stretching  - 2-4 x daily - 7 x weekly - 5-10 sets - 10 reps - prop 5-10 minutes & quad set5 seconds hold - Supine Heel Slide with Strap  - 2-3 x daily - 7 x weekly - 2-3 sets - 10 reps - 5 seconds hold - Supine Knee Extension Strengthening  - 2-3 x daily - 7 x weekly - 2-3 sets - 10 reps - 5 seconds hold - Supine Straight Leg Raises  - 2-3 x daily - 7 x weekly - 2-3 sets - 10 reps - 5 seconds hold - Seated Knee Flexion Extension AROM   - 2-4 x daily - 7 x weekly - 2-3 sets - 10 reps - 5 seconds hold - Seated Long Arc Quad  - 2-3 x daily - 7 x weekly - 2-3 sets - 10 reps - 5 seconds hold - Seated straight leg lifts  - 2-3 x daily - 7 x weekly - 2-3 sets - 10 reps - 5 seconds hold - Seated Hamstring Stretch with Strap  - 2-4 x daily - 7 x weekly - 1 sets - 3 reps - 20-30 seconds hold  ASSESSMENT:  CLINICAL IMPRESSION: Pt arriving today reporting she  has been intermittently wearing her thigh high TED hose. Pt still reporting pain and difficulty sleeping. We discussed pillow placement and postioning. Pt tolerating exercises well with mild improvements in her AROM. We discussed increasing pt walking activity daily.        OBJECTIVE IMPAIRMENTS: Abnormal gait, decreased activity tolerance, decreased balance, decreased endurance, decreased knowledge of condition, decreased knowledge of use of DME, decreased mobility, difficulty walking, decreased ROM, decreased strength, increased edema, increased muscle spasms, impaired flexibility, obesity, and pain.   ACTIVITY LIMITATIONS:  carrying, lifting, bending, sitting, standing, squatting, sleeping, stairs, transfers, and locomotion level  PARTICIPATION LIMITATIONS: meal prep, cleaning, laundry, driving, and community activity  PERSONAL FACTORS: Fitness and 1-2 comorbidities: see PMH  are also affecting patient's functional outcome.   REHAB POTENTIAL: Good  CLINICAL DECISION MAKING: Stable/uncomplicated  EVALUATION COMPLEXITY: Low   GOALS: Goals reviewed with patient? Yes  SHORT TERM GOALS: (target date for Short term goals are 4 weeks 01/03/2023)   1.  Patient will demonstrate independent use of home exercise program to maintain progress from in clinic treatments.  Goal status: on-going 12/16/22  LONG TERM GOALS: (target dates for all long term goals are 10 weeks  02/14/2023 )   1. Patient will demonstrate/report pain at worst less than or equal to 2/10 to facilitate minimal limitation in daily activity secondary to pain symptoms.  Goal status: New   2. Patient will demonstrate independent use of home exercise program to facilitate ability to maintain/progress functional gains from skilled physical therapy services.  Goal status: New   3. Patient will demonstrate FOTO outcome > or = 60 % to indicate reduced disability due to condition.  Goal status: New   4.  Patient will demonstrate LE MMT 5/5 throughout to faciltiate usual transfers, stairs, squatting at [facility] for daily life.   Goal status: New   5.  Patient ambulates >500' & negotiates ramps, curbs & stairs single rail without assistive device independently. Goal status: New     PLAN:  PT FREQUENCY: 2-3x/week  PT DURATION: 10 weeks  PLANNED INTERVENTIONS: Therapeutic exercises, Therapeutic activity, Neuromuscular re-education, Balance training, Gait training, Patient/Family education, Self Care, Joint mobilization, Stair training, DME instructions, Electrical stimulation, Cryotherapy, Moist heat, scar mobilization, Taping, Vasopneumatic device,  Ultrasound, Ionotophoresis '4mg'$ /ml Dexamethasone, and Manual therapy  PLAN FOR NEXT SESSION:  continue Manual therapy & there exer for range, add standing balance & functional activities.  vaso to end   Oretha Caprice, PT, MPT 12/16/2022, 11:00 AM

## 2022-12-17 ENCOUNTER — Encounter: Payer: Self-pay | Admitting: Physical Therapy

## 2022-12-17 ENCOUNTER — Ambulatory Visit (INDEPENDENT_AMBULATORY_CARE_PROVIDER_SITE_OTHER): Payer: Medicare Other | Admitting: Physical Therapy

## 2022-12-17 DIAGNOSIS — R252 Cramp and spasm: Secondary | ICD-10-CM

## 2022-12-17 DIAGNOSIS — R600 Localized edema: Secondary | ICD-10-CM | POA: Diagnosis not present

## 2022-12-17 DIAGNOSIS — M25562 Pain in left knee: Secondary | ICD-10-CM | POA: Diagnosis not present

## 2022-12-17 DIAGNOSIS — M25662 Stiffness of left knee, not elsewhere classified: Secondary | ICD-10-CM

## 2022-12-17 DIAGNOSIS — M6281 Muscle weakness (generalized): Secondary | ICD-10-CM | POA: Diagnosis not present

## 2022-12-17 NOTE — Therapy (Signed)
OUTPATIENT PHYSICAL THERAPY TREATMENT NOTE   Patient Name: Joan [name] MRN: [id number] DOB:1956/01/17, 67 y.o., female Today's Date: 12/17/2022  END OF SESSION:  PT End of Session - 12/17/22 1153     Visit Number 5    Number of Visits 25    Date for PT Re-Evaluation 02/14/23    Authorization Type Medicare & [employer] sup    Progress Note Due on Visit 10    PT Start Time 1149    PT Stop Time 1235    PT Time Calculation (min) 46 min    Equipment Utilized During Treatment Gait belt    Activity Tolerance Patient tolerated treatment well;Patient limited by pain    Behavior During Therapy WFL for tasks assessed/performed                Past Medical History:  Diagnosis Date   Allergy    Anemia    due to Dysmenorrhea   Arthritis    generalized    Hypertension    initially after BCP initiated    Hypothyroidism    PONV (postoperative nausea and vomiting)    Thyroid disease 2005 & 2009   nodule   Past Surgical History:  Procedure Laterality Date   ABDOMINAL HYSTERECTOMY  1998   for heavy menses and fibroids   BIOPSY THYROID  2005,2009   both negative   CESAREAN SECTION     x2   COLONOSCOPY  2009    Dr [facility]   EXPLORATORY LAPAROTOMY  1985   Ovarian cyst   [place]    LLQ pain due to ovarian cyst   SIGMOIDOSCOPY     TONSILLECTOMY     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   Procedure: LEFT TOTAL KNEE ARTHROPLASTY;  Surgeon: Mcarthur Rossetti, MD;  Location: [facility];  Service: Orthopedics;  Laterality: Left;   Patient Active Problem List   Diagnosis Date Noted   Status post total left knee replacement 11/22/2022   Unilateral primary osteoarthritis, left knee 09/25/2022   Degenerative arthritis of left knee 08/20/2021   Degenerative spondylolisthesis 03/31/2020   Lumbar radiculopathy 03/28/2020   Mid back pain on right side 09/28/2018   Osteopenia 08/19/2018   Hyperglycemia 07/09/2018   Hypothyroidism 06/30/2017    Gallbladder polyp 05/20/2016   Fatty liver 05/20/2016   Obesity 05/01/2016   Vitamin D deficiency 07/14/2013   LOW BACK PAIN SYNDROME 01/23/2010   Status post radioactive iodine thyroid ablation 08/17/2009   Hyperlipidemia 07/26/2008   Essential hypertension 07/26/2008   Pain in joint of left shoulder 07/26/2008   Goiter 07/14/2007    PCP: [name], MD  REFERRING PROVIDER: Jean Rosenthal, MD  REFERRING DIAG: [phone] (ICD-10-CM) - Status post total left knee replacement   THERAPY DIAG:  Acute pain of left knee  Stiffness of left knee, not elsewhere classified  Localized edema  Cramp and spasm  Muscle weakness (generalized)  Rationale for Evaluation and Treatment: Rehabilitation  ONSET DATE: 11/22/2022 TKA sg  SUBJECTIVE:   SUBJECTIVE STATEMENT: Pt stating 6/10 pain in her left knee. Pt stating she is taking Aleve 3 times each day and tylenol arthritis 2 pills  twice a day.   PERTINENT HISTORY: OA, lumbar radiculopathy / LBP, osteopenia, obesity, s/p radioactive thyroid ablation, HTN  PAIN:  NPRS scale: 6/10  Pain location: Left knee more medial Pain description: sore Aggravating factors: sitting, increasing distance suddenly Relieving factors: laying down with knee ext,  ice & Aleve/tylenol   PRECAUTIONS:  None  WEIGHT BEARING RESTRICTIONS: Yes WBAT LLE  FALLS:  Has patient fallen in last 6 months? 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   PATIENT  SURVEYS:  FOTO intake: 38%   predicted:  60%  COGNITION: Overall cognitive status: WFL    SENSATION: WFL  EDEMA:  Circumferential:  LLE above knee 50.7cm  around knee 47cm  below knee 40cm RLE above knee 46.7cm  around knee 43.6cm  below knee 36 cm   POSTURE: flexed trunk  and weight shift right  PALPATION: Tenderness medial knee (quad, joint line & proximal tibia), lateral joint line & incision.   LOWER EXTREMITY ROM:   ROM P:passive  A:active Left eval Left 12/10/12 Left 12/16/22 Left  Hip flexion      Hip extension      Hip abduction      Hip adduction      Hip internal rotation      Hip external rotation      Knee flexion Seated A: 83* P: 94* Supine  Supine A: 90 P: 95 Supine A: 94 P: 98 Supine A: 96 P: 100   Knee extension Seated  A: LAQ -13* Supine P: -8* A: -10* Supine A: -8 P: -4 Supine A: -8 P: -4 Supine A: -5 P: -3  Ankle dorsiflexion      Ankle plantarflexion      Ankle inversion      Ankle eversion       (Blank rows = not tested)  LOWER EXTREMITY MMT:  MMT Left eval  Hip flexion   Hip extension   Hip abduction   Hip adduction   Hip internal rotation   Hip external rotation   Knee flexion 3-/5  Knee extension 3-/5  Ankle dorsiflexion   Ankle plantarflexion   Ankle inversion   Ankle eversion    (Blank rows = not tested)   FUNCTIONAL TESTS:  18 inch chair transfer: requires use of BUEs on armrests, weight shift to RLE, limited knee flex  GAIT: Distance walked: 100' Assistive device utilized: Single point cane Level of assistance: SBA Comments: LLE stiff leg with flexion in stance & minimal increase knee flex for swing, antalgic with LLE decreased stance time, LLE abducted, trunk flexed   TODAY'S TREATMENT                                                                          DATE:  12/17/2022: TherEx:  Nustep: seat 8 Level 5 x 8 minutes c BLEs / BUEs Calf stretch on slant board:  holding 60 sec Calf raises x 20 c  UE support Seated: SLR c Left LE 2 x 10 LAQ: 4# weight 2 x 10 holding 3 seconds Supine heeel slides x 10 Functional  Activities:  Up and down ramp x 4 with close supervision c st cane (gait belt) Up and down 1 flight of stairs using step to gait pattern and progressing to step through pattern c single hand rail (gait belt) c CGA Manual PROM: knee flexion and extension with overpressure STM to medial knee and posterior knee c biofreeze applied  Modalities:  Vasopneumatic: x 10 minutes, medium compression 34 degrees, Left LE   12/16/2022: TherEx:  Nustep: seat 8 Level 5 x 8 minutes c BLEs / BUEs Calf stretch on slant board: x 2 holding 30 sec Calf raises x 20 c UE support Step ups on 6 inch step: x 15 single UE support Leg Press: bil LE's 62# 3 x 10 Leg Press: Left LE only 31# 2 x 10 Supine heeel slides x 10 Supine SLR: x 15 Left LE  Manual PROM: knee flexion and extension with overpressure  Modalities:  Vasopneumatic: x 10 minutes, medium compression 34 degrees, Left LE    12/11/2022: TherEx:  Nustep: seat 8 Level 5 x 8 minutes c BLEs / BUEs Sit to stand: x 10 c UE support  PT demo & verbal cues on technique. Pt return demo.  LAQ & active knee flexion with contralateral LE opposing motion: 1 min without weight, then LLE 3# x 15 Supine quad set 5 sec hold 10 reps Supine heeel slides with LLE on red / 65cm ball with strap assist: x 20  Therapeutic Activities: Pre-gait: at counter terminal stance knee flexion stepping over 2" high X 10" long step and wt shift onto LLE in stance for 2 min.  Carryover knee flex terminal stance / swing & ext in stance into gait with cane.  Pt able to return demo with verbal cues.  PT instructed to weight shift onto LLE for 2-3 sec 5+ reps upon arising prior to ambulating.  Pt performed during session with noted decrease in pain spike.  Due to pain, pt recommending cane in home & RW in community for next few days.  She has balance & strength to amb  with cane but pain response could limit progression.  Tandem stance on floor with LLE in front & in back 30 sec ea  Manual PROM: knee flexion and extension with overpressure  Contract - relax  Modalities:  Vasopneumatic 34 degrees, moderate compression x 10 minutes        PATIENT EDUCATION:  Education details: HEP, POC Person educated: Patient & husband Education method: Consulting civil engineer, Media planner, Verbal cues, and Handouts Education comprehension: verbalized understanding, returned demonstration, and verbal cues required  HOME EXERCISE PROGRAM: Access Code: [facility] URL: [email or web address] Date: 12/09/2022 Prepared by: Jamey Reas  Exercises - Ankle Alphabet in Elevation  - 2-4 x daily - 7 x weekly - 1 sets - 1 reps - Quad Setting and Stretching  - 2-4 x daily - 7 x weekly - 5-10 sets - 10 reps - prop 5-10 minutes & quad set5 seconds hold - Supine Heel Slide with Strap  - 2-3 x daily - 7 x weekly - 2-3 sets - 10 reps - 5 seconds hold - Supine Knee Extension Strengthening  - 2-3 x daily - 7 x weekly - 2-3 sets - 10 reps - 5 seconds hold - Supine Straight Leg Raises  - 2-3 x daily - 7 x weekly - 2-3 sets - 10 reps - 5 seconds hold - Seated Knee Flexion Extension AROM   - 2-4 x daily - 7 x weekly - 2-3 sets - 10  reps - 5 seconds hold - Seated Long Arc Quad  - 2-3 x daily - 7 x weekly - 2-3 sets - 10 reps - 5 seconds hold - Seated straight leg lifts  - 2-3 x daily - 7 x weekly - 2-3 sets - 10 reps - 5 seconds hold - Seated Hamstring Stretch with Strap  - 2-4 x daily - 7 x weekly - 1 sets - 3 reps - 20-30 seconds hold  ASSESSMENT:  CLINICAL IMPRESSION: Pt arriving today reporting 6/10 pain. Pt reporting compliance in her HEP. Pt's active knee flexion has improved to 95 degrees. Pt tolerating exercises well. Pt was instructed in stair navigation progressing from step to pattern to step through using CGA, st cane and single rail. Continue skilled PT to maximize  pt's function.        OBJECTIVE IMPAIRMENTS: Abnormal gait, decreased activity tolerance, decreased balance, decreased endurance, decreased knowledge of condition, decreased knowledge of use of DME, decreased mobility, difficulty walking, decreased ROM, decreased strength, increased edema, increased muscle spasms, impaired flexibility, obesity, and pain.   ACTIVITY LIMITATIONS: carrying, lifting, bending, sitting, standing, squatting, sleeping, stairs, transfers, and locomotion level  PARTICIPATION LIMITATIONS: meal prep, cleaning, laundry, driving, and community activity  PERSONAL FACTORS: Fitness and 1-2 comorbidities: see PMH  are also affecting patient's functional outcome.   REHAB POTENTIAL: Good  CLINICAL DECISION MAKING: Stable/uncomplicated  EVALUATION COMPLEXITY: Low   GOALS: Goals reviewed with patient? Yes  SHORT TERM GOALS: (target date for Short term goals are 4 weeks 01/03/2023)   1.  Patient will demonstrate independent use of home exercise program to maintain progress from in clinic treatments.  Goal status: MET 12/17/22  LONG TERM GOALS: (target dates for all long term goals are 10 weeks  02/14/2023 )   1. Patient will demonstrate/report pain at worst less than or equal to 2/10 to facilitate minimal limitation in daily activity secondary to pain symptoms.  Goal status: New   2. Patient will demonstrate independent use of home exercise program to facilitate ability to maintain/progress functional gains from skilled physical therapy services.  Goal status: New   3. Patient will demonstrate FOTO outcome > or = 60 % to indicate reduced disability due to condition.  Goal status: New   4.  Patient will demonstrate LE MMT 5/5 throughout to faciltiate usual transfers, stairs, squatting at [facility] for daily life.   Goal status: New   5.  Patient ambulates >500' & negotiates ramps, curbs & stairs single rail without assistive device independently. Goal status: New      PLAN:  PT FREQUENCY: 2-3x/week  PT DURATION: 10 weeks  PLANNED INTERVENTIONS: Therapeutic exercises, Therapeutic activity, Neuromuscular re-education, Balance training, Gait training, Patient/Family education, Self Care, Joint mobilization, Stair training, DME instructions, Electrical stimulation, Cryotherapy, Moist heat, scar mobilization, Taping, Vasopneumatic device, Ultrasound, Ionotophoresis '4mg'$ /ml Dexamethasone, and Manual therapy  PLAN FOR NEXT SESSION:  continue Manual therapy & there exer for range, add standing balance & functional activities.  vaso to end   Oretha Caprice, PT, MPT 12/17/2022, 12:21 PM

## 2022-12-18 ENCOUNTER — Encounter: Payer: Self-pay | Admitting: Physical Therapy

## 2022-12-18 ENCOUNTER — Ambulatory Visit (INDEPENDENT_AMBULATORY_CARE_PROVIDER_SITE_OTHER): Payer: Medicare Other | Admitting: Physical Therapy

## 2022-12-18 DIAGNOSIS — M6281 Muscle weakness (generalized): Secondary | ICD-10-CM

## 2022-12-18 DIAGNOSIS — M25662 Stiffness of left knee, not elsewhere classified: Secondary | ICD-10-CM | POA: Diagnosis not present

## 2022-12-18 DIAGNOSIS — M25562 Pain in left knee: Secondary | ICD-10-CM

## 2022-12-18 DIAGNOSIS — R600 Localized edema: Secondary | ICD-10-CM | POA: Diagnosis not present

## 2022-12-18 DIAGNOSIS — R2689 Other abnormalities of gait and mobility: Secondary | ICD-10-CM

## 2022-12-18 NOTE — Therapy (Signed)
OUTPATIENT PHYSICAL THERAPY TREATMENT NOTE   Patient Name: Joan [name] MRN: [id number] DOB:Apr 28, 1956, 67 y.o., female Today's Date: 12/18/2022  END OF SESSION:  PT End of Session - 12/18/22 1305     Visit Number 6    Number of Visits 25    Date for PT Re-Evaluation 02/14/23    Authorization Type Medicare & [employer] sup    Progress Note Due on Visit 10    PT Start Time 1303    PT Stop Time 1355    PT Time Calculation (min) 52 min    Equipment Utilized During Treatment Gait belt    Activity Tolerance Patient tolerated treatment well;Patient limited by pain    Behavior During Therapy WFL for tasks assessed/performed                Past Medical History:  Diagnosis Date   Allergy    Anemia    due to Dysmenorrhea   Arthritis    generalized    Hypertension    initially after BCP initiated    Hypothyroidism    PONV (postoperative nausea and vomiting)    Thyroid disease 2005 & 2009   nodule   Past Surgical History:  Procedure Laterality Date   ABDOMINAL HYSTERECTOMY  1998   for heavy menses and fibroids   BIOPSY THYROID  2005,2009   both negative   CESAREAN SECTION     x2   COLONOSCOPY  2009    Dr [facility]   EXPLORATORY LAPAROTOMY  1985   Ovarian cyst   [place]    LLQ pain due to ovarian cyst   SIGMOIDOSCOPY     TONSILLECTOMY     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   Procedure: LEFT TOTAL KNEE ARTHROPLASTY;  Surgeon: Mcarthur Rossetti, MD;  Location: [facility];  Service: Orthopedics;  Laterality: Left;   Patient Active Problem List   Diagnosis Date Noted   Status post total left knee replacement 11/22/2022   Unilateral primary osteoarthritis, left knee 09/25/2022   Degenerative arthritis of left knee 08/20/2021   Degenerative spondylolisthesis 03/31/2020   Lumbar radiculopathy 03/28/2020   Mid back pain on right side 09/28/2018   Osteopenia 08/19/2018   Hyperglycemia 07/09/2018   Hypothyroidism 06/30/2017    Gallbladder polyp 05/20/2016   Fatty liver 05/20/2016   Obesity 05/01/2016   Vitamin D deficiency 07/14/2013   LOW BACK PAIN SYNDROME 01/23/2010   Status post radioactive iodine thyroid ablation 08/17/2009   Hyperlipidemia 07/26/2008   Essential hypertension 07/26/2008   Pain in joint of left shoulder 07/26/2008   Goiter 07/14/2007    PCP: [name], MD  REFERRING PROVIDER: Jean Rosenthal, MD  REFERRING DIAG: [phone] (ICD-10-CM) - Status post total left knee replacement   THERAPY DIAG:  Acute pain of left knee  Stiffness of left knee, not elsewhere classified  Localized edema  Muscle weakness (generalized)  Other abnormalities of gait and mobility  Rationale for Evaluation and Treatment: Rehabilitation  ONSET DATE: 11/22/2022 TKA sg  SUBJECTIVE:   SUBJECTIVE STATEMENT: She has been doing exercises. She has been sitting on her swing to work on some knee flexion.   PERTINENT HISTORY: OA, lumbar radiculopathy / LBP, osteopenia, obesity, s/p radioactive thyroid ablation, HTN  PAIN:  NPRS scale: 5/10 with range 5-7/10 Pain location: Left knee more medial Pain description: sore Aggravating factors: sitting, increasing distance suddenly Relieving factors: laying down with knee ext,  ice & Aleve/tylenol   PRECAUTIONS: None  WEIGHT BEARING  RESTRICTIONS: Yes WBAT LLE  FALLS:  Has patient fallen in last 6 months? 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   PATIENT  SURVEYS:  FOTO intake: 38%   predicted:  60%  COGNITION: Overall cognitive status: WFL    SENSATION: WFL  EDEMA:  Circumferential:  LLE above knee 50.7cm  around knee 47cm  below knee 40cm RLE above knee 46.7cm  around knee 43.6cm  below knee 36 cm   POSTURE: flexed trunk  and weight shift right  PALPATION: Tenderness medial knee (quad, joint line & proximal tibia), lateral joint line & incision.   LOWER EXTREMITY ROM:   ROM P:passive  A:active Left eval Left 12/10/12 Left 12/16/22 Left 12/17/22  Hip flexion      Hip extension      Hip abduction      Hip adduction      Hip internal rotation      Hip external rotation      Knee flexion Seated A: 83* P: 94* Supine  Supine A: 90 P: 95 Supine A: 94 P: 98 Supine A: 96 P: 100   Knee extension Seated  A: LAQ -13* Supine P: -8* A: -10* Supine A: -8 P: -4 Supine A: -8 P: -4 Supine A: -5 P: -3  Ankle dorsiflexion      Ankle plantarflexion      Ankle inversion      Ankle eversion       (Blank rows = not tested)  LOWER EXTREMITY MMT:  MMT Left eval  Hip flexion   Hip extension   Hip abduction   Hip adduction   Hip internal rotation   Hip external rotation   Knee flexion 3-/5  Knee extension 3-/5  Ankle dorsiflexion   Ankle plantarflexion   Ankle inversion   Ankle eversion    (Blank rows = not tested)   FUNCTIONAL TESTS:  18 inch chair transfer: requires use of BUEs on armrests, weight shift to RLE, limited knee flex  GAIT: Distance walked: 100' Assistive device utilized: Single point cane Level of assistance: SBA Comments: LLE stiff leg with flexion in stance & minimal increase knee flex for swing, antalgic with LLE decreased stance time, LLE abducted, trunk flexed   TODAY'S TREATMENT                                                                          DATE:  12/18/2022: TherEx:  SciFit recumbent bike: seat 9 Level 1 x 8 minutes c BLEs / BUEs Leg press BLEs 81# 15 reps;  43# LLE 15  reps Calf stretch on step heel depression:  holding 30 sec 2 reps Calf raises x 20 c UE support Hamstring stretch with LLE on 2nd step forward trunk lean  10 sec hold 5 reps Knee flexion stretch  with LLE on 2nd step forward trunk lean 10 sec hold 5 reps  Functional Activities:  descend 1 flight of stairs using step-to LLE eccentric gait pattern and 2nd flight alternating pattern c single hand rail (gait belt) c CGA; ascend alternating pattern 2 flights.   Neuromuscular Re-education: Tandem stance on foam LLE in front & in back 30 sec ea.  LLE SLS on foam with RLE tapping cone 20 reps. Rocker board ant/post & right/left with knee ext & slight flexion 1 min ea. Without UE support  Manual PROM: knee flexion and extension with overpressure Scar mob with approximation same direction  Modalities:  Vasopneumatic: x 10 minutes, medium compression 34 degrees, Left LE  12/17/2022: TherEx:  Nustep: seat 8 Level 5 x 8 minutes c BLEs / BUEs Calf stretch on slant board:  holding 60 sec Calf raises x 20 c UE support Seated: SLR c Left LE 2 x 10 LAQ: 4# weight 2 x 10 holding 3 seconds Supine heeel slides x 10 Functional Activities:  Up and down ramp x 4 with close supervision c st cane (gait belt) Up and down 1 flight of stairs using step to gait pattern and progressing to step through pattern c single hand rail (gait belt) c CGA Manual PROM: knee flexion and extension with overpressure STM to medial knee and posterior knee c biofreeze applied  Modalities:  Vasopneumatic: x 10 minutes, medium compression 34 degrees, Left LE   12/16/2022: TherEx:  Nustep: seat 8 Level 5 x 8 minutes c BLEs / BUEs Calf stretch on slant board: x 2 holding 30 sec Calf raises x 20 c UE support Step ups on 6 inch step: x 15 single UE support Leg Press: bil LE's 62# 3 x 10 Leg Press: Left LE only 31# 2 x 10 Supine heeel slides x 10 Supine SLR: x 15 Left LE  Manual PROM: knee flexion and extension with  overpressure  Modalities:  Vasopneumatic: x 10 minutes, medium compression 34 degrees, Left LE      PATIENT EDUCATION:  Education details: HEP, POC Person educated: Patient & husband Education method: Consulting civil engineer, Media planner, Verbal cues, and Handouts Education comprehension: verbalized understanding, returned demonstration, and verbal cues required  HOME EXERCISE PROGRAM: Access Code: [facility] URL: [email or web address] Date: 12/09/2022 Prepared by: Jamey Reas  Exercises - Ankle Alphabet in Elevation  - 2-4 x daily - 7 x weekly - 1 sets - 1 reps - Quad Setting and Stretching  - 2-4 x daily - 7 x weekly - 5-10 sets - 10 reps - prop 5-10 minutes & quad set5 seconds hold - Supine Heel Slide with Strap  - 2-3 x daily - 7 x weekly - 2-3 sets - 10 reps - 5 seconds hold - Supine Knee Extension Strengthening  - 2-3 x daily - 7 x weekly - 2-3 sets - 10 reps - 5 seconds hold - Supine Straight Leg Raises  - 2-3 x daily - 7 x weekly - 2-3 sets - 10 reps - 5 seconds hold - Seated Knee Flexion Extension AROM   - 2-4 x daily - 7 x weekly - 2-3 sets - 10 reps - 5 seconds hold - Seated Long Arc Quad  - 2-3 x daily - 7 x weekly - 2-3 sets - 10 reps - 5 seconds hold - Seated straight leg lifts  - 2-3 x daily - 7 x weekly - 2-3 sets - 10 reps - 5 seconds hold -  Seated Hamstring Stretch with Strap  - 2-4 x daily - 7 x weekly - 1 sets - 3 reps - 20-30 seconds hold  ASSESSMENT:  CLINICAL IMPRESSION: Patient had less pain with PROM manual therapy today.  Patient's function range & strength are slowly improving.  Pt continues to benefit from skilled PT.    OBJECTIVE IMPAIRMENTS: Abnormal gait, decreased activity tolerance, decreased balance, decreased endurance, decreased knowledge of condition, decreased knowledge of use of DME, decreased mobility, difficulty walking, decreased ROM, decreased strength, increased edema, increased muscle spasms, impaired flexibility, obesity, and pain.    ACTIVITY LIMITATIONS: carrying, lifting, bending, sitting, standing, squatting, sleeping, stairs, transfers, and locomotion level  PARTICIPATION LIMITATIONS: meal prep, cleaning, laundry, driving, and community activity  PERSONAL FACTORS: Fitness and 1-2 comorbidities: see PMH  are also affecting patient's functional outcome.   REHAB POTENTIAL: Good  CLINICAL DECISION MAKING: Stable/uncomplicated  EVALUATION COMPLEXITY: Low   GOALS: Goals reviewed with patient? Yes  SHORT TERM GOALS: (target date for Short term goals are 4 weeks 01/03/2023)   1.  Patient will demonstrate independent use of home exercise program to maintain progress from in clinic treatments.  Goal status: MET 12/17/22  LONG TERM GOALS: (target dates for all long term goals are 10 weeks  02/14/2023 )   1. Patient will demonstrate/report pain at worst less than or equal to 2/10 to facilitate minimal limitation in daily activity secondary to pain symptoms.  Goal status: New   2. Patient will demonstrate independent use of home exercise program to facilitate ability to maintain/progress functional gains from skilled physical therapy services.  Goal status: New   3. Patient will demonstrate FOTO outcome > or = 60 % to indicate reduced disability due to condition.  Goal status: New   4.  Patient will demonstrate LE MMT 5/5 throughout to faciltiate usual transfers, stairs, squatting at [facility] for daily life.   Goal status: New   5.  Patient ambulates >500' & negotiates ramps, curbs & stairs single rail without assistive device independently. Goal status: New     PLAN:  PT FREQUENCY: 2-3x/week  PT DURATION: 10 weeks  PLANNED INTERVENTIONS: Therapeutic exercises, Therapeutic activity, Neuromuscular re-education, Balance training, Gait training, Patient/Family education, Self Care, Joint mobilization, Stair training, DME instructions, Electrical stimulation, Cryotherapy, Moist heat, scar mobilization, Taping,  Vasopneumatic device, Ultrasound, Ionotophoresis '4mg'$ /ml Dexamethasone, and Manual therapy  PLAN FOR NEXT SESSION:  Manual therapy & there exer for range, standing balance & functional activities.  vaso to end   Jamey Reas, PT, DPT 12/18/2022, 2:22 PM

## 2022-12-23 ENCOUNTER — Encounter: Payer: Self-pay | Admitting: Physical Therapy

## 2022-12-23 ENCOUNTER — Ambulatory Visit (INDEPENDENT_AMBULATORY_CARE_PROVIDER_SITE_OTHER): Payer: Medicare Other | Admitting: Physical Therapy

## 2022-12-23 DIAGNOSIS — R600 Localized edema: Secondary | ICD-10-CM

## 2022-12-23 DIAGNOSIS — M25662 Stiffness of left knee, not elsewhere classified: Secondary | ICD-10-CM

## 2022-12-23 DIAGNOSIS — M6281 Muscle weakness (generalized): Secondary | ICD-10-CM | POA: Diagnosis not present

## 2022-12-23 DIAGNOSIS — R2689 Other abnormalities of gait and mobility: Secondary | ICD-10-CM

## 2022-12-23 DIAGNOSIS — M25562 Pain in left knee: Secondary | ICD-10-CM | POA: Diagnosis not present

## 2022-12-23 NOTE — Therapy (Signed)
OUTPATIENT PHYSICAL THERAPY TREATMENT NOTE   Patient Name: Joan [name] MRN: [id number] DOB:18-Sep-1956, 67 y.o., female Today's Date: 12/23/2022  END OF SESSION:  PT End of Session - 12/23/22 1147     Visit Number 7    Number of Visits 25    Date for PT Re-Evaluation 02/14/23    Authorization Type Medicare & [employer] sup    Progress Note Due on Visit 10    PT Start Time 1147    PT Stop Time 1237    PT Time Calculation (min) 50 min    Equipment Utilized During Treatment Gait belt    Activity Tolerance Patient tolerated treatment well;Patient limited by pain    Behavior During Therapy WFL for tasks assessed/performed                Past Medical History:  Diagnosis Date   Allergy    Anemia    due to Dysmenorrhea   Arthritis    generalized    Hypertension    initially after BCP initiated    Hypothyroidism    PONV (postoperative nausea and vomiting)    Thyroid disease 2005 & 2009   nodule   Past Surgical History:  Procedure Laterality Date   ABDOMINAL HYSTERECTOMY  1998   for heavy menses and fibroids   BIOPSY THYROID  2005,2009   both negative   CESAREAN SECTION     x2   COLONOSCOPY  2009    Dr [facility]   EXPLORATORY LAPAROTOMY  1985   Ovarian cyst   [place]    LLQ pain due to ovarian cyst   SIGMOIDOSCOPY     TONSILLECTOMY     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   Procedure: LEFT TOTAL KNEE ARTHROPLASTY;  Surgeon: Mcarthur Rossetti, MD;  Location: [facility];  Service: Orthopedics;  Laterality: Left;   Patient Active Problem List   Diagnosis Date Noted   Status post total left knee replacement 11/22/2022   Unilateral primary osteoarthritis, left knee 09/25/2022   Degenerative arthritis of left knee 08/20/2021   Degenerative spondylolisthesis 03/31/2020   Lumbar radiculopathy 03/28/2020   Mid back pain on right side 09/28/2018   Osteopenia 08/19/2018   Hyperglycemia 07/09/2018   Hypothyroidism 06/30/2017    Gallbladder polyp 05/20/2016   Fatty liver 05/20/2016   Obesity 05/01/2016   Vitamin D deficiency 07/14/2013   LOW BACK PAIN SYNDROME 01/23/2010   Status post radioactive iodine thyroid ablation 08/17/2009   Hyperlipidemia 07/26/2008   Essential hypertension 07/26/2008   Pain in joint of left shoulder 07/26/2008   Goiter 07/14/2007    PCP: [name], MD  REFERRING PROVIDER: Jean Rosenthal, MD  REFERRING DIAG: [phone] (ICD-10-CM) - Status post total left knee replacement   THERAPY DIAG:  Acute pain of left knee  Stiffness of left knee, not elsewhere classified  Localized edema  Muscle weakness (generalized)  Other abnormalities of gait and mobility  Rationale for Evaluation and Treatment: Rehabilitation  ONSET DATE: 11/22/2022 TKA sg  SUBJECTIVE:   SUBJECTIVE STATEMENT: Her mother got admitted to [facility] with RSV. Her husband is pushing her in w/c to visit her mother then walks around her room.  She has been doing her exercises.    PERTINENT HISTORY: OA, lumbar radiculopathy / LBP, osteopenia, obesity, s/p radioactive thyroid ablation, HTN  PAIN:  NPRS scale: 5/10 with range 5-7/10 Pain location: Left knee more medial Pain description: sore Aggravating factors: sitting, increasing distance suddenly Relieving factors: laying down  with knee ext,  ice & Aleve/tylenol   PRECAUTIONS: None  WEIGHT BEARING RESTRICTIONS: Yes WBAT LLE  FALLS:  Has patient fallen in last 6 months? No  LIVING ENVIRONMENT: Lives with: lives with their spouse Lives in: House single level Stairs: Yes: External: 3 steps; on left going up Has following equipment at home: Single point cane, Environmental consultant - 2 wheeled, Wheelchair (manual), and Grab bars  OCCUPATION: retired from Retail buyer at [employer],   [place]: Independent  PATIENT GOALS:   improve knees to enable to enjoy retirement including travel  Next MD visit:  01/02/2022   OBJECTIVE:   DIAGNOSTIC FINDINGS: 11/23/2023 Post  LEFT total knee arthroplasty without acute complication.   PATIENT SURVEYS:  FOTO intake: 38%   predicted:  60%  COGNITION: Overall cognitive status: WFL    SENSATION: WFL  EDEMA:  Circumferential:  LLE above knee 50.7cm  around knee 47cm  below knee 40cm RLE above knee 46.7cm  around knee 43.6cm  below knee 36 cm   POSTURE: flexed trunk  and weight shift right  PALPATION: Tenderness medial knee (quad, joint line & proximal tibia), lateral joint line & incision.   LOWER EXTREMITY ROM:   ROM P:passive  A:active Left eval Left 12/10/12 Left 12/16/22 Left 12/17/22 Left 12/23/22  Hip flexion       Hip extension       Hip abduction       Hip adduction       Hip internal rotation       Hip external rotation       Knee flexion Seated A: 83* P: 94* Supine  Supine A: 90 P: 95 Supine A: 94 P: 98 Supine A: 96 P: 100  Supine A: 100* P: 102*  Knee extension Seated  A: LAQ -13* Supine P: -8* A: -10* Supine A: -8 P: -4 Supine A: -8 P: -4 Supine A: -5 P: -3 Supine A: -3 P: -2  Ankle dorsiflexion       Ankle plantarflexion       Ankle inversion       Ankle eversion        (Blank rows = not tested)  LOWER EXTREMITY MMT:  MMT Left eval  Hip flexion   Hip extension   Hip abduction   Hip adduction   Hip internal rotation   Hip external rotation   Knee flexion 3-/5  Knee extension 3-/5  Ankle dorsiflexion   Ankle plantarflexion   Ankle inversion   Ankle eversion    (Blank rows = not tested)   FUNCTIONAL TESTS:  18 inch chair transfer: requires use of BUEs on armrests, weight shift to RLE, limited knee flex  GAIT: Distance walked: 100' Assistive device utilized: Single point cane Level of assistance: SBA Comments: LLE stiff leg with flexion in stance & minimal increase knee flex for swing, antalgic with LLE decreased stance time, LLE abducted, trunk flexed   TODAY'S TREATMENT                                                                           DATE:  12/23/2022: TherEx:  SciFit recumbent bike: seat 9 Level 1 x 8 minutes c BLEs / BUEs Leg press BLEs 87#  15 reps;  43# LLE 15 reps Calf stretch on step heel depression:  holding 30 sec 2 reps Calf raises BLE concentric & RLE eccentric x 20 c UE support Hamstring stretch LLE AAROM SLR strap 30 sec hold 2 reps Seated IR & ER 10 reps  Functional Activities:  Step up, over & down on BOSU round side up with BUE support 2 step lead for momentum 10 reps RLE    Neuromuscular Re-education: Rocker board ant/post & right/left with knee ext & slight flexion 1 min ea. Without UE support. 1 rep square board two pivots & 1 rep round board single pivot  Manual PROM: knee flexion and extension with overpressure  Modalities:  Vasopneumatic: x 10 minutes, medium compression 34 degrees, Left LE  12/18/2022: TherEx:  SciFit recumbent bike: seat 9 Level 1 x 8 minutes c BLEs / BUEs Leg press BLEs 81# 15 reps;  43# LLE 15 reps Calf stretch on step heel depression:  holding 30 sec 2 reps Calf raises x 20 c UE support Hamstring stretch with LLE on 2nd step forward trunk lean 10 sec hold 5 reps Knee flexion stretch  with LLE on 2nd step forward trunk lean 10 sec hold 5 reps  Functional Activities:  descend 1 flight of stairs using step-to LLE eccentric gait pattern and 2nd flight alternating pattern c single hand rail (gait belt) c CGA; ascend alternating pattern 2 flights.   Neuromuscular Re-education: Tandem stance on foam LLE in front & in back 30 sec ea.  LLE SLS on foam with RLE tapping cone 20 reps. Rocker board ant/post & right/left with knee ext & slight flexion 1 min ea. Without UE support  Manual PROM: knee flexion and extension with overpressure Scar mob with approximation same direction  Modalities:  Vasopneumatic: x 10 minutes, medium compression 34 degrees, Left LE  12/17/2022: TherEx:  Nustep: seat 8 Level 5 x 8 minutes c BLEs / BUEs Calf stretch on slant board:  holding 60  sec Calf raises x 20 c UE support Seated: SLR c Left LE 2 x 10 LAQ: 4# weight 2 x 10 holding 3 seconds Supine heeel slides x 10 Functional Activities:  Up and down ramp x 4 with close supervision c st cane (gait belt) Up and down 1 flight of stairs using step to gait pattern and progressing to step through pattern c single hand rail (gait belt) c CGA Manual PROM: knee flexion and extension with overpressure STM to medial knee and posterior knee c biofreeze applied  Modalities:  Vasopneumatic: x 10 minutes, medium compression 34 degrees, Left LE   12/16/2022: TherEx:  Nustep: seat 8 Level 5 x 8 minutes c BLEs / BUEs Calf stretch on slant board: x 2 holding 30 sec Calf raises x 20 c UE support Step ups on 6 inch step: x 15 single UE support Leg Press: bil LE's 62# 3 x 10 Leg Press: Left LE only 31# 2 x 10 Supine heeel slides x 10 Supine SLR: x 15 Left LE  Manual PROM: knee flexion and extension with overpressure  Modalities:  Vasopneumatic: x 10 minutes, medium compression 34 degrees, Left LE      PATIENT EDUCATION:  Education details: HEP, POC Person educated: Patient & husband Education method: Consulting civil engineer, Media planner, Verbal cues, and Handouts Education comprehension: verbalized understanding, returned demonstration, and verbal cues required  HOME EXERCISE PROGRAM: Access Code: [facility] URL: [email or web address] Date: 12/09/2022 Prepared by: Jamey Reas  Exercises - Ankle Alphabet in Elevation  - 2-4  x daily - 7 x weekly - 1 sets - 1 reps - Quad Setting and Stretching  - 2-4 x daily - 7 x weekly - 5-10 sets - 10 reps - prop 5-10 minutes & quad set5 seconds hold - Supine Heel Slide with Strap  - 2-3 x daily - 7 x weekly - 2-3 sets - 10 reps - 5 seconds hold - Supine Knee Extension Strengthening  - 2-3 x daily - 7 x weekly - 2-3 sets - 10 reps - 5 seconds hold - Supine Straight Leg Raises  - 2-3 x daily - 7 x weekly - 2-3 sets - 10 reps - 5 seconds  hold - Seated Knee Flexion Extension AROM   - 2-4 x daily - 7 x weekly - 2-3 sets - 10 reps - 5 seconds hold - Seated Long Arc Quad  - 2-3 x daily - 7 x weekly - 2-3 sets - 10 reps - 5 seconds hold - Seated straight leg lifts  - 2-3 x daily - 7 x weekly - 2-3 sets - 10 reps - 5 seconds hold - Seated Hamstring Stretch with Strap  - 2-4 x daily - 7 x weekly - 1 sets - 3 reps - 20-30 seconds hold  ASSESSMENT:  CLINICAL IMPRESSION: Pt is improving knee range & function with PT progressive instruction.  Pt continues to benefit from skilled PT.    OBJECTIVE IMPAIRMENTS: Abnormal gait, decreased activity tolerance, decreased balance, decreased endurance, decreased knowledge of condition, decreased knowledge of use of DME, decreased mobility, difficulty walking, decreased ROM, decreased strength, increased edema, increased muscle spasms, impaired flexibility, obesity, and pain.   ACTIVITY LIMITATIONS: carrying, lifting, bending, sitting, standing, squatting, sleeping, stairs, transfers, and locomotion level  PARTICIPATION LIMITATIONS: meal prep, cleaning, laundry, driving, and community activity  PERSONAL FACTORS: Fitness and 1-2 comorbidities: see PMH  are also affecting patient's functional outcome.   REHAB POTENTIAL: Good  CLINICAL DECISION MAKING: Stable/uncomplicated  EVALUATION COMPLEXITY: Low   GOALS: Goals reviewed with patient? Yes  SHORT TERM GOALS: (target date for Short term goals are 4 weeks 01/03/2023)   1.  Patient will demonstrate independent use of home exercise program to maintain progress from in clinic treatments.  Goal status: MET 12/17/22  LONG TERM GOALS: (target dates for all long term goals are 10 weeks  02/14/2023 )   1. Patient will demonstrate/report pain at worst less than or equal to 2/10 to facilitate minimal limitation in daily activity secondary to pain symptoms.  Goal status: New   2. Patient will demonstrate independent use of home exercise program to  facilitate ability to maintain/progress functional gains from skilled physical therapy services.  Goal status: New   3. Patient will demonstrate FOTO outcome > or = 60 % to indicate reduced disability due to condition.  Goal status: New   4.  Patient will demonstrate LE MMT 5/5 throughout to faciltiate usual transfers, stairs, squatting at [facility] for daily life.   Goal status: New   5.  Patient ambulates >500' & negotiates ramps, curbs & stairs single rail without assistive device independently. Goal status: New     PLAN:  PT FREQUENCY: 2-3x/week  PT DURATION: 10 weeks  PLANNED INTERVENTIONS: Therapeutic exercises, Therapeutic activity, Neuromuscular re-education, Balance training, Gait training, Patient/Family education, Self Care, Joint mobilization, Stair training, DME instructions, Electrical stimulation, Cryotherapy, Moist heat, scar mobilization, Taping, Vasopneumatic device, Ultrasound, Ionotophoresis '4mg'$ /ml Dexamethasone, and Manual therapy  PLAN FOR NEXT SESSION:  update HEP for standing exercises & balance,  Manual therapy & there exer for range, standing balance & functional activities.  vaso to end   Jamey Reas, PT, DPT 12/23/2022, 12:48 PM

## 2022-12-24 ENCOUNTER — Encounter: Payer: Medicare Other | Admitting: Physical Therapy

## 2022-12-25 ENCOUNTER — Encounter: Payer: Self-pay | Admitting: Physical Therapy

## 2022-12-25 ENCOUNTER — Ambulatory Visit (INDEPENDENT_AMBULATORY_CARE_PROVIDER_SITE_OTHER): Payer: Medicare Other | Admitting: Physical Therapy

## 2022-12-25 DIAGNOSIS — R2689 Other abnormalities of gait and mobility: Secondary | ICD-10-CM

## 2022-12-25 DIAGNOSIS — M25562 Pain in left knee: Secondary | ICD-10-CM

## 2022-12-25 DIAGNOSIS — M25662 Stiffness of left knee, not elsewhere classified: Secondary | ICD-10-CM | POA: Diagnosis not present

## 2022-12-25 DIAGNOSIS — M6281 Muscle weakness (generalized): Secondary | ICD-10-CM

## 2022-12-25 DIAGNOSIS — R600 Localized edema: Secondary | ICD-10-CM | POA: Diagnosis not present

## 2022-12-25 NOTE — Therapy (Signed)
OUTPATIENT PHYSICAL THERAPY TREATMENT NOTE   Patient Name: Joan [name] MRN: [id number] DOB:Jan 06, 1956, 67 y.o., female Today's Date: 12/25/2022  END OF SESSION:  PT End of Session - 12/25/22 1059     Visit Number 8    Number of Visits 25    Date for PT Re-Evaluation 02/14/23    Authorization Type Medicare & [employer] sup    Progress Note Due on Visit 10    PT Start Time 1100    PT Stop Time 1158    PT Time Calculation (min) 58 min    Equipment Utilized During Treatment Gait belt    Activity Tolerance Patient tolerated treatment well;Patient limited by pain    Behavior During Therapy WFL for tasks assessed/performed                Past Medical History:  Diagnosis Date   Allergy    Anemia    due to Dysmenorrhea   Arthritis    generalized    Hypertension    initially after BCP initiated    Hypothyroidism    PONV (postoperative nausea and vomiting)    Thyroid disease 2005 & 2009   nodule   Past Surgical History:  Procedure Laterality Date   ABDOMINAL HYSTERECTOMY  1998   for heavy menses and fibroids   BIOPSY THYROID  2005,2009   both negative   CESAREAN SECTION     x2   COLONOSCOPY  2009    Dr [facility]   EXPLORATORY LAPAROTOMY  1985   Ovarian cyst   [place]    LLQ pain due to ovarian cyst   SIGMOIDOSCOPY     TONSILLECTOMY     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   Procedure: LEFT TOTAL KNEE ARTHROPLASTY;  Surgeon: Mcarthur Rossetti, MD;  Location: [facility];  Service: Orthopedics;  Laterality: Left;   Patient Active Problem List   Diagnosis Date Noted   Status post total left knee replacement 11/22/2022   Unilateral primary osteoarthritis, left knee 09/25/2022   Degenerative arthritis of left knee 08/20/2021   Degenerative spondylolisthesis 03/31/2020   Lumbar radiculopathy 03/28/2020   Mid back pain on right side 09/28/2018   Osteopenia 08/19/2018   Hyperglycemia 07/09/2018   Hypothyroidism 06/30/2017    Gallbladder polyp 05/20/2016   Fatty liver 05/20/2016   Obesity 05/01/2016   Vitamin D deficiency 07/14/2013   LOW BACK PAIN SYNDROME 01/23/2010   Status post radioactive iodine thyroid ablation 08/17/2009   Hyperlipidemia 07/26/2008   Essential hypertension 07/26/2008   Pain in joint of left shoulder 07/26/2008   Goiter 07/14/2007    PCP: [name], MD  REFERRING PROVIDER: Jean Rosenthal, MD  REFERRING DIAG: [phone] (ICD-10-CM) - Status post total left knee replacement   THERAPY DIAG:  Acute pain of left knee  Stiffness of left knee, not elsewhere classified  Localized edema  Muscle weakness (generalized)  Other abnormalities of gait and mobility  Rationale for Evaluation and Treatment: Rehabilitation  ONSET DATE: 11/22/2022 TKA sg  SUBJECTIVE:   SUBJECTIVE STATEMENT:  she went to funeral & standing was okay.  She has hard time sleeping when she does more during day.     PERTINENT HISTORY: OA, lumbar radiculopathy / LBP, osteopenia, obesity, s/p radioactive thyroid ablation, HTN  PAIN:  NPRS scale:  5/10 with range 4-6/10 Pain location: Left knee more medial Pain description: sore Aggravating factors: sitting, increasing distance suddenly Relieving factors: laying down with knee ext,  ice & Aleve/tylenol  PRECAUTIONS: None  WEIGHT BEARING RESTRICTIONS: Yes WBAT LLE  FALLS:  Has patient fallen in last 6 months? No  LIVING ENVIRONMENT: Lives with: lives with their spouse Lives in: House single level Stairs: Yes: External: 3 steps; on left going up Has following equipment at home: Single point cane, Environmental consultant - 2 wheeled, Wheelchair (manual), and Grab bars  OCCUPATION: retired from Retail buyer at [employer],   [place]: Independent  PATIENT GOALS:   improve knees to enable to enjoy retirement including travel  Next MD visit:  01/02/2022   OBJECTIVE:   DIAGNOSTIC FINDINGS: 11/23/2023 Post LEFT total knee arthroplasty without acute complication.    PATIENT SURVEYS:  FOTO intake: 38%   predicted:  60%  COGNITION: Overall cognitive status: WFL    SENSATION: WFL  EDEMA:  Circumferential:  LLE above knee 50.7cm  around knee 47cm  below knee 40cm RLE above knee 46.7cm  around knee 43.6cm  below knee 36 cm   POSTURE: flexed trunk  and weight shift right  PALPATION: Tenderness medial knee (quad, joint line & proximal tibia), lateral joint line & incision.   LOWER EXTREMITY ROM:   ROM P:passive  A:active Left eval Left 12/10/12 Left 12/16/22 Left 12/17/22 Left 12/23/22  Hip flexion       Hip extension       Hip abduction       Hip adduction       Hip internal rotation       Hip external rotation       Knee flexion Seated A: 83* P: 94* Supine  Supine A: 90 P: 95 Supine A: 94 P: 98 Supine A: 96 P: 100  Supine A: 100* P: 102*  Knee extension Seated  A: LAQ -13* Supine P: -8* A: -10* Supine A: -8 P: -4 Supine A: -8 P: -4 Supine A: -5 P: -3 Supine A: -3 P: -2  Ankle dorsiflexion       Ankle plantarflexion       Ankle inversion       Ankle eversion        (Blank rows = not tested)  LOWER EXTREMITY MMT:  MMT Left eval  Hip flexion   Hip extension   Hip abduction   Hip adduction   Hip internal rotation   Hip external rotation   Knee flexion 3-/5  Knee extension 3-/5  Ankle dorsiflexion   Ankle plantarflexion   Ankle inversion   Ankle eversion    (Blank rows = not tested)   FUNCTIONAL TESTS:  18 inch chair transfer: requires use of BUEs on armrests, weight shift to RLE, limited knee flex  GAIT: Distance walked: 100' Assistive device utilized: Single point cane Level of assistance: SBA Comments: LLE stiff leg with flexion in stance & minimal increase knee flex for swing, antalgic with LLE decreased stance time, LLE abducted, trunk flexed   TODAY'S TREATMENT                                                                          DATE:  12/25/2022: TherEx:  SciFit recumbent bike:  seat 9 Level 1 x 8 minutes c BLEs only Leg press BLEs 87# 15 reps with 5 sec hold flex & ext;  43# LLE 15 reps Calf stretch on step heel depression:  holding 30 sec 2 reps Calf raises BLE concentric & RLE eccentric x 20 no UE support Hamstring stretch LLE AAROM SLR strap 30 sec hold 2 reps LLE Lateral step up on 6" step RUE support 15 reps Supine holding femur vertical - knee flexion 10 sec then pushing heel to ceiling for hamstring stretch 10 sec for 10 reps   Self-Care: PT demo & verbal cues on scar mobs. Pt verbalized understanding.  PT demo & verbal cues on ice massage. Pt verbalized & return demo understanding.   Manual PROM: knee flexion and extension with overpressure  Modalities:  Vasopneumatic: x 10 minutes, medium compression 34 degrees, Left knee  12/23/2022: TherEx:  SciFit recumbent bike: seat 9 Level 1 x 8 minutes c BLEs / BUEs Leg press BLEs 87# 15 reps;  43# LLE 15 reps Calf stretch on step heel depression:  holding 30 sec 2 reps Calf raises BLE concentric & RLE eccentric x 20 c UE support Hamstring stretch LLE AAROM SLR strap 30 sec hold 2 reps Seated IR & ER 10 reps  Functional Activities:  Step up, over & down on BOSU round side up with BUE support 2 step lead for momentum 10 reps RLE    Neuromuscular Re-education: Rocker board ant/post & right/left with knee ext & slight flexion 1 min ea. Without UE support. 1 rep square board two pivots & 1 rep round board single pivot  Manual PROM: knee flexion and extension with overpressure  Modalities:  Vasopneumatic: x 10 minutes, medium compression 34 degrees, Left LE  12/18/2022: TherEx:  SciFit recumbent bike: seat 9 Level 1 x 8 minutes c BLEs / BUEs Leg press BLEs 81# 15 reps;  43# LLE 15 reps Calf stretch on step heel depression:  holding 30 sec 2 reps Calf raises x 20 c UE support Hamstring stretch with LLE on 2nd step forward trunk lean 10 sec hold 5 reps Knee flexion stretch  with LLE on 2nd step forward  trunk lean 10 sec hold 5 reps  Functional Activities:  descend 1 flight of stairs using step-to LLE eccentric gait pattern and 2nd flight alternating pattern c single hand rail (gait belt) c CGA; ascend alternating pattern 2 flights.   Neuromuscular Re-education: Tandem stance on foam LLE in front & in back 30 sec ea.  LLE SLS on foam with RLE tapping cone 20 reps. Rocker board ant/post & right/left with knee ext & slight flexion 1 min ea. Without UE support  Manual PROM: knee flexion and extension with overpressure Scar mob with approximation same direction  Modalities:  Vasopneumatic: x 10 minutes, medium compression 34 degrees, Left LE   PATIENT EDUCATION:  Education details: HEP, POC Person educated: Patient & husband Education method: Explanation, Demonstration, Verbal cues, and Handouts Education comprehension: verbalized understanding, returned demonstration, and verbal cues required  HOME EXERCISE PROGRAM: Access Code: [facility] URL: [email or web address] Date: 12/09/2022 Prepared by: Jamey Reas  Exercises - Ankle Alphabet in Elevation  - 2-4 x daily - 7 x weekly - 1 sets - 1 reps - Quad Setting and Stretching  - 2-4 x daily - 7 x weekly - 5-10 sets - 10 reps - prop 5-10 minutes & quad set5 seconds hold - Supine Heel Slide with Strap  - 2-3 x daily - 7 x weekly - 2-3 sets - 10 reps - 5 seconds hold - Supine Knee Extension Strengthening  - 2-3 x daily - 7 x weekly -  2-3 sets - 10 reps - 5 seconds hold - Supine Straight Leg Raises  - 2-3 x daily - 7 x weekly - 2-3 sets - 10 reps - 5 seconds hold - Seated Knee Flexion Extension AROM   - 2-4 x daily - 7 x weekly - 2-3 sets - 10 reps - 5 seconds hold - Seated Long Arc Quad  - 2-3 x daily - 7 x weekly - 2-3 sets - 10 reps - 5 seconds hold - Seated straight leg lifts  - 2-3 x daily - 7 x weekly - 2-3 sets - 10 reps - 5 seconds hold - Seated Hamstring Stretch with Strap  - 2-4 x daily - 7 x weekly - 1 sets - 3  reps - 20-30 seconds hold  ASSESSMENT:  CLINICAL IMPRESSION: Patient appears to have less pain with exercises & activities.  She is slowly improving her range & functional strength.    Pt continues to benefit from skilled PT.    OBJECTIVE IMPAIRMENTS: Abnormal gait, decreased activity tolerance, decreased balance, decreased endurance, decreased knowledge of condition, decreased knowledge of use of DME, decreased mobility, difficulty walking, decreased ROM, decreased strength, increased edema, increased muscle spasms, impaired flexibility, obesity, and pain.   ACTIVITY LIMITATIONS: carrying, lifting, bending, sitting, standing, squatting, sleeping, stairs, transfers, and locomotion level  PARTICIPATION LIMITATIONS: meal prep, cleaning, laundry, driving, and community activity  PERSONAL FACTORS: Fitness and 1-2 comorbidities: see PMH  are also affecting patient's functional outcome.   REHAB POTENTIAL: Good  CLINICAL DECISION MAKING: Stable/uncomplicated  EVALUATION COMPLEXITY: Low   GOALS: Goals reviewed with patient? Yes  SHORT TERM GOALS: (target date for Short term goals are 4 weeks 01/03/2023)   1.  Patient will demonstrate independent use of home exercise program to maintain progress from in clinic treatments.  Goal status: MET 12/17/22  LONG TERM GOALS: (target dates for all long term goals are 10 weeks  02/14/2023 )   1. Patient will demonstrate/report pain at worst less than or equal to 2/10 to facilitate minimal limitation in daily activity secondary to pain symptoms.  Goal status: New   2. Patient will demonstrate independent use of home exercise program to facilitate ability to maintain/progress functional gains from skilled physical therapy services.  Goal status: New   3. Patient will demonstrate FOTO outcome > or = 60 % to indicate reduced disability due to condition.  Goal status: New   4.  Patient will demonstrate LE MMT 5/5 throughout to faciltiate usual  transfers, stairs, squatting at [facility] for daily life.   Goal status: New   5.  Patient ambulates >500' & negotiates ramps, curbs & stairs single rail without assistive device independently. Goal status: New     PLAN:  PT FREQUENCY: 2-3x/week  PT DURATION: 10 weeks  PLANNED INTERVENTIONS: Therapeutic exercises, Therapeutic activity, Neuromuscular re-education, Balance training, Gait training, Patient/Family education, Self Care, Joint mobilization, Stair training, DME instructions, Electrical stimulation, Cryotherapy, Moist heat, scar mobilization, Taping, Vasopneumatic device, Ultrasound, Ionotophoresis '4mg'$ /ml Dexamethasone, and Manual therapy  PLAN FOR NEXT SESSION:   update HEP for standing exercises & balance, Manual therapy & there exer for range, standing balance & functional activities.  vaso to end   Jamey Reas, PT, DPT 12/25/2022, 11:55 AM

## 2022-12-26 ENCOUNTER — Ambulatory Visit (INDEPENDENT_AMBULATORY_CARE_PROVIDER_SITE_OTHER): Payer: Medicare Other | Admitting: Physical Therapy

## 2022-12-26 ENCOUNTER — Encounter: Payer: Self-pay | Admitting: Physical Therapy

## 2022-12-26 DIAGNOSIS — M6281 Muscle weakness (generalized): Secondary | ICD-10-CM | POA: Diagnosis not present

## 2022-12-26 DIAGNOSIS — R600 Localized edema: Secondary | ICD-10-CM | POA: Diagnosis not present

## 2022-12-26 DIAGNOSIS — R2689 Other abnormalities of gait and mobility: Secondary | ICD-10-CM

## 2022-12-26 DIAGNOSIS — M25562 Pain in left knee: Secondary | ICD-10-CM

## 2022-12-26 DIAGNOSIS — M25662 Stiffness of left knee, not elsewhere classified: Secondary | ICD-10-CM

## 2022-12-26 NOTE — Therapy (Signed)
OUTPATIENT PHYSICAL THERAPY TREATMENT NOTE   Patient Name: Joan [name] MRN: [id number] DOB:[birth year], 67 y.o., female Today's Date: 12/26/2022  END OF SESSION:  PT End of Session - 12/26/22 0926     Visit Number 9    Number of Visits 25    Date for PT Re-Evaluation 02/14/23    Authorization Type Medicare & [employer] sup    Progress Note Due on Visit 10    PT Start Time 0926    PT Stop Time 1022    PT Time Calculation (min) 56 min    Equipment Utilized During Treatment Gait belt    Activity Tolerance Patient tolerated treatment well;Patient limited by pain    Behavior During Therapy WFL for tasks assessed/performed                 Past Medical History:  Diagnosis Date   Allergy    Anemia    due to Dysmenorrhea   Arthritis    generalized    Hypertension    initially after BCP initiated    Hypothyroidism    PONV (postoperative nausea and vomiting)    Thyroid disease 2005 & 2009   nodule   Past Surgical History:  Procedure Laterality Date   ABDOMINAL HYSTERECTOMY  1998   for heavy menses and fibroids   BIOPSY THYROID  2005,2009   both negative   CESAREAN SECTION     x2   COLONOSCOPY  2009    Dr [facility]   EXPLORATORY LAPAROTOMY  1985   Ovarian cyst   [facility]    LLQ pain due to ovarian cyst   SIGMOIDOSCOPY     TONSILLECTOMY     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   Procedure: LEFT TOTAL KNEE ARTHROPLASTY;  Surgeon: Mcarthur Rossetti, MD;  Location: [facility];  Service: Orthopedics;  Laterality: Left;   Patient Active Problem List   Diagnosis Date Noted   Status post total left knee replacement 11/22/2022   Unilateral primary osteoarthritis, left knee 09/25/2022   Degenerative arthritis of left knee 08/20/2021   Degenerative spondylolisthesis 03/31/2020   Lumbar radiculopathy 03/28/2020   Mid back pain on right side 09/28/2018   Osteopenia 08/19/2018   Hyperglycemia 07/09/2018   Hypothyroidism 06/30/2017    Gallbladder polyp 05/20/2016   Fatty liver 05/20/2016   Obesity 05/01/2016   Vitamin D deficiency 07/14/2013   LOW BACK PAIN SYNDROME 01/23/2010   Status post radioactive iodine thyroid ablation 08/17/2009   Hyperlipidemia 07/26/2008   Essential hypertension 07/26/2008   Pain in joint of left shoulder 07/26/2008   Goiter 07/14/2007    PCP: [name], MD  REFERRING PROVIDER: Jean Rosenthal, MD  REFERRING DIAG: [phone] (ICD-10-CM) - Status post total left knee replacement   THERAPY DIAG:  Acute pain of left knee  Stiffness of left knee, not elsewhere classified  Localized edema  Muscle weakness (generalized)  Other abnormalities of gait and mobility  Rationale for Evaluation and Treatment: Rehabilitation  ONSET DATE: 11/22/2022 TKA sg  SUBJECTIVE:   SUBJECTIVE STATEMENT:  She got RSV vaccine yesterday.     PERTINENT HISTORY: OA, lumbar radiculopathy / LBP, osteopenia, obesity, s/p radioactive thyroid ablation, HTN  PAIN:  NPRS scale: 4/10 with range 4-6/10 Pain location: Left knee more medial Pain description: sore Aggravating factors: sitting, increasing distance suddenly Relieving factors: laying down with knee ext,  ice & Aleve/tylenol   PRECAUTIONS: None  WEIGHT BEARING RESTRICTIONS: Yes WBAT LLE  FALLS:  Has patient  fallen in last 6 months? No  LIVING ENVIRONMENT: Lives with: lives with their spouse Lives in: House single level Stairs: Yes: External: 3 steps; on left going up Has following equipment at home: Single point cane, Environmental consultant - 2 wheeled, Wheelchair (manual), and Grab bars  OCCUPATION: retired from Retail buyer at [employer],   [place]: Independent  PATIENT GOALS:   improve knees to enable to enjoy retirement including travel  Next MD visit:  01/02/2022   OBJECTIVE:   DIAGNOSTIC FINDINGS: 11/23/2023 Post LEFT total knee arthroplasty without acute complication.   PATIENT SURVEYS:  FOTO intake: 38%   predicted:   60%  COGNITION: Overall cognitive status: WFL    SENSATION: WFL  EDEMA:  Circumferential:  LLE above knee 50.7cm  around knee 47cm  below knee 40cm RLE above knee 46.7cm  around knee 43.6cm  below knee 36 cm   POSTURE: flexed trunk  and weight shift right  PALPATION: Tenderness medial knee (quad, joint line & proximal tibia), lateral joint line & incision.   LOWER EXTREMITY ROM:   ROM P:passive  A:active Left eval Left 12/10/12 Left 12/16/22 Left 12/17/22 Left 12/23/22  Hip flexion       Hip extension       Hip abduction       Hip adduction       Hip internal rotation       Hip external rotation       Knee flexion Seated A: 83* P: 94* Supine  Supine A: 90 P: 95 Supine A: 94 P: 98 Supine A: 96 P: 100  Supine A: 100* P: 102*  Knee extension Seated  A: LAQ -13* Supine P: -8* A: -10* Supine A: -8 P: -4 Supine A: -8 P: -4 Supine A: -5 P: -3 Supine A: -3 P: -2  Ankle dorsiflexion       Ankle plantarflexion       Ankle inversion       Ankle eversion        (Blank rows = not tested)  LOWER EXTREMITY MMT:  MMT Left eval  Hip flexion   Hip extension   Hip abduction   Hip adduction   Hip internal rotation   Hip external rotation   Knee flexion 3-/5  Knee extension 3-/5  Ankle dorsiflexion   Ankle plantarflexion   Ankle inversion   Ankle eversion    (Blank rows = not tested)   FUNCTIONAL TESTS:  18 inch chair transfer: requires use of BUEs on armrests, weight shift to RLE, limited knee flex  GAIT: Distance walked: 100' Assistive device utilized: Single point cane Level of assistance: SBA Comments: LLE stiff leg with flexion in stance & minimal increase knee flex for swing, antalgic with LLE decreased stance time, LLE abducted, trunk flexed   TODAY'S TREATMENT                                                                          DATE:  12/26/2022: TherEx:  Precor recumbent bike: seat 6 rocking for flexion stretch 4 min, backwards full  revolutions 4 min Leg press BLEs 100# 15 reps with 5 sec hold flex & ext;  50# LLE 15 reps Hamstring stretch LLE SLR strap 30 sec  hold 2 reps Squat at sink 5 sec hold 15 reps Calf stretch on step heel depression:  holding 30 sec 2 reps Calf raises BLE concentric & RLE eccentric x 20 light UE support Seated green theraband hamstring curl 15 reps Seated red theraband LAQ 15 reps Standing TKE green theraband 15 reps Supine holding femur vertical - knee flexion 10 sec then pushing heel to ceiling for hamstring stretch 10 sec for 10 reps PT updated HEP. Pt verbalized & return demo understanding.    Manual PROM: knee flexion and extension with overpressure  Modalities:  Vasopneumatic: x 10 minutes, medium compression 34 degrees, Left knee   12/25/2022: TherEx:  SciFit recumbent bike: seat 9 Level 1 x 8 minutes c BLEs only Leg press BLEs 87# 15 reps with 5 sec hold flex & ext;  43# LLE 15 reps Calf stretch on step heel depression:  holding 30 sec 2 reps Calf raises BLE concentric & RLE eccentric x 20 no UE support Hamstring stretch LLE AAROM SLR strap 30 sec hold 2 reps LLE Lateral step up on 6" step RUE support 15 reps Supine holding femur vertical - knee flexion 10 sec then pushing heel to ceiling for hamstring stretch 10 sec for 10 reps   Self-Care: PT demo & verbal cues on scar mobs. Pt verbalized understanding.  PT demo & verbal cues on ice massage. Pt verbalized & return demo understanding.   Manual PROM: knee flexion and extension with overpressure  Modalities:  Vasopneumatic: x 10 minutes, medium compression 34 degrees, Left knee  12/23/2022: TherEx:  SciFit recumbent bike: seat 9 Level 1 x 8 minutes c BLEs / BUEs Leg press BLEs 87# 15 reps;  43# LLE 15 reps Calf stretch on step heel depression:  holding 30 sec 2 reps Calf raises BLE concentric & RLE eccentric x 20 c UE support Hamstring stretch LLE AAROM SLR strap 30 sec hold 2 reps Seated IR & ER 10 reps  Functional  Activities:  Step up, over & down on BOSU round side up with BUE support 2 step lead for momentum 10 reps RLE    Neuromuscular Re-education: Rocker board ant/post & right/left with knee ext & slight flexion 1 min ea. Without UE support. 1 rep square board two pivots & 1 rep round board single pivot  Manual PROM: knee flexion and extension with overpressure  Modalities:  Vasopneumatic: x 10 minutes, medium compression 34 degrees, Left LE   PATIENT EDUCATION:  Education details: HEP, POC Person educated: Patient & husband Education method: Explanation, Media planner, Verbal cues, and Handouts Education comprehension: verbalized understanding, returned demonstration, and verbal cues required  HOME EXERCISE PROGRAM: Access Code: [facility] URL: [email or web address] Date: 12/26/2022 Prepared by: Jamey Reas  Exercises - Ankle Alphabet in Elevation  - 2-4 x daily - 7 x weekly - 1 sets - 1 reps - Quad Setting and Stretching  - 2-4 x daily - 7 x weekly - 5-10 sets - 10 reps - prop 5-10 minutes & quad set5 seconds hold - Supine Heel Slide with Strap  - 2-3 x daily - 7 x weekly - 2-3 sets - 10 reps - 5 seconds hold - Supine Knee Extension Strengthening  - 2-3 x daily - 7 x weekly - 2-3 sets - 10 reps - 5 seconds hold - Supine Straight Leg Raises  - 2-3 x daily - 7 x weekly - 2-3 sets - 10 reps - 5 seconds hold - Seated Knee Flexion Extension AROM   - 2-4 x  daily - 7 x weekly - 2-3 sets - 10 reps - 5 seconds hold - Seated Long Arc Quad  - 2-3 x daily - 7 x weekly - 2-3 sets - 10 reps - 5 seconds hold - Seated straight leg lifts  - 2-3 x daily - 7 x weekly - 2-3 sets - 10 reps - 5 seconds hold - Seated Hamstring Stretch with Strap  - 2-4 x daily - 7 x weekly - 1 sets - 3 reps - 20-30 seconds hold - Standing Eccentric Heel Raise  - 2 x daily - 7 x weekly - 1 sets - 20 reps - 5 seconds hold - Gastroc Stretch on Step  - 2 x daily - 7 x weekly - 1 sets - 3 reps - 20-30 seconds  hold - Standing Soleus Stretch on Step with Counter Support  - 2 x daily - 7 x weekly - 1 sets - 3 reps - 20-30 seconds hold - Squat with Counter Support  - 2 x daily - 7 x weekly - 2 sets - 10 reps - 5 seconds hold - Seated Hamstring Curl with Anchored Resistance  - 1 x daily - 7 x weekly - 2-3 sets - 10 reps - 5 seconds hold - Sitting Knee Extension with Resistance  - 1 x daily - 7 x weekly - 2-3 sets - 10 reps - 5 seconds hold - Standing Terminal Knee Extension with Resistance  - 1 x daily - 7 x weekly - 2-3 sets - 10 reps - 5 seconds hold  ASSESSMENT:  CLINICAL IMPRESSION: PT updated HEP which she appears to understand.   Patient is improving range & strength but pain & edema continue to  limit her.  Pt continues to benefit from skilled PT.   OBJECTIVE IMPAIRMENTS: Abnormal gait, decreased activity tolerance, decreased balance, decreased endurance, decreased knowledge of condition, decreased knowledge of use of DME, decreased mobility, difficulty walking, decreased ROM, decreased strength, increased edema, increased muscle spasms, impaired flexibility, obesity, and pain.   ACTIVITY LIMITATIONS: carrying, lifting, bending, sitting, standing, squatting, sleeping, stairs, transfers, and locomotion level  PARTICIPATION LIMITATIONS: meal prep, cleaning, laundry, driving, and community activity  PERSONAL FACTORS: Fitness and 1-2 comorbidities: see PMH  are also affecting patient's functional outcome.   REHAB POTENTIAL: Good  CLINICAL DECISION MAKING: Stable/uncomplicated  EVALUATION COMPLEXITY: Low   GOALS: Goals reviewed with patient? Yes  SHORT TERM GOALS: (target date for Short term goals are 4 weeks 01/03/2023)   1.  Patient will demonstrate independent use of home exercise program to maintain progress from in clinic treatments.  Goal status: MET 12/17/22  LONG TERM GOALS: (target dates for all long term goals are 10 weeks  02/14/2023 )   1. Patient will demonstrate/report pain at  worst less than or equal to 2/10 to facilitate minimal limitation in daily activity secondary to pain symptoms.  Goal status: New   2. Patient will demonstrate independent use of home exercise program to facilitate ability to maintain/progress functional gains from skilled physical therapy services.  Goal status: New   3. Patient will demonstrate FOTO outcome > or = 60 % to indicate reduced disability due to condition.  Goal status: New   4.  Patient will demonstrate LE MMT 5/5 throughout to faciltiate usual transfers, stairs, squatting at [facility] for daily life.   Goal status: New   5.  Patient ambulates >500' & negotiates ramps, curbs & stairs single rail without assistive device independently. Goal status: New  PLAN:  PT FREQUENCY: 2-3x/week  PT DURATION: 10 weeks  PLANNED INTERVENTIONS: Therapeutic exercises, Therapeutic activity, Neuromuscular re-education, Balance training, Gait training, Patient/Family education, Self Care, Joint mobilization, Stair training, DME instructions, Electrical stimulation, Cryotherapy, Moist heat, scar mobilization, Taping, Vasopneumatic device, Ultrasound, Ionotophoresis '4mg'$ /ml Dexamethasone, and Manual therapy  PLAN FOR NEXT SESSION:   do 10th visit progress note, FOTO & check STGs.  Manual therapy & there exer for range, standing balance & functional activities.  vaso to end   Jamey Reas, PT, DPT 12/26/2022, 12:58 PM

## 2022-12-30 ENCOUNTER — Ambulatory Visit (INDEPENDENT_AMBULATORY_CARE_PROVIDER_SITE_OTHER): Payer: Medicare Other | Admitting: Physical Therapy

## 2022-12-30 ENCOUNTER — Encounter: Payer: Self-pay | Admitting: Physical Therapy

## 2022-12-30 DIAGNOSIS — R252 Cramp and spasm: Secondary | ICD-10-CM | POA: Diagnosis not present

## 2022-12-30 DIAGNOSIS — R2689 Other abnormalities of gait and mobility: Secondary | ICD-10-CM | POA: Diagnosis not present

## 2022-12-30 DIAGNOSIS — M25562 Pain in left knee: Secondary | ICD-10-CM | POA: Diagnosis not present

## 2022-12-30 DIAGNOSIS — R600 Localized edema: Secondary | ICD-10-CM | POA: Diagnosis not present

## 2022-12-30 DIAGNOSIS — M25662 Stiffness of left knee, not elsewhere classified: Secondary | ICD-10-CM | POA: Diagnosis not present

## 2022-12-30 DIAGNOSIS — M6281 Muscle weakness (generalized): Secondary | ICD-10-CM

## 2022-12-30 NOTE — Therapy (Signed)
OUTPATIENT PHYSICAL THERAPY TREATMENT NOTE & PROGRESS NOTE    Patient Name: Joan [name] MRN: [id number] DOB:30-Dec-1955, 67 y.o., female Today's Date: 12/30/2022  Progress Note Reporting Period 12/09/2022 to 12/30/2022  See note below for Objective Data and Assessment of Progress/Goals.      END OF SESSION:  PT End of Session - 12/30/22 1139     Visit Number 10    Number of Visits 25    Date for PT Re-Evaluation 02/14/23    Authorization Type Medicare & [employer] sup    Progress Note Due on Visit 10    PT Start Time 1139    PT Stop Time 1240    PT Time Calculation (min) 61 min    Equipment Utilized During Treatment Gait belt    Activity Tolerance Patient tolerated treatment well;Patient limited by pain    Behavior During Therapy WFL for tasks assessed/performed                  Past Medical History:  Diagnosis Date   Allergy    Anemia    due to Dysmenorrhea   Arthritis    generalized    Hypertension    initially after BCP initiated    Hypothyroidism    PONV (postoperative nausea and vomiting)    Thyroid disease 2005 & 2009   nodule   Past Surgical History:  Procedure Laterality Date   ABDOMINAL HYSTERECTOMY  1998   for heavy menses and fibroids   BIOPSY THYROID  2005,2009   both negative   CESAREAN SECTION     x2   COLONOSCOPY  2009    Dr [facility]   EXPLORATORY LAPAROTOMY  1985   Ovarian cyst   [facility]    LLQ pain due to ovarian cyst   SIGMOIDOSCOPY     TONSILLECTOMY     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   Procedure: LEFT TOTAL KNEE ARTHROPLASTY;  Surgeon: Mcarthur Rossetti, MD;  Location: [facility];  Service: Orthopedics;  Laterality: Left;   Patient Active Problem List   Diagnosis Date Noted   Status post total left knee replacement 11/22/2022   Unilateral primary osteoarthritis, left knee 09/25/2022   Degenerative arthritis of left knee 08/20/2021   Degenerative spondylolisthesis 03/31/2020    Lumbar radiculopathy 03/28/2020   Mid back pain on right side 09/28/2018   Osteopenia 08/19/2018   Hyperglycemia 07/09/2018   Hypothyroidism 06/30/2017   Gallbladder polyp 05/20/2016   Fatty liver 05/20/2016   Obesity 05/01/2016   Vitamin D deficiency 07/14/2013   LOW BACK PAIN SYNDROME 01/23/2010   Status post radioactive iodine thyroid ablation 08/17/2009   Hyperlipidemia 07/26/2008   Essential hypertension 07/26/2008   Pain in joint of left shoulder 07/26/2008   Goiter 07/14/2007    PCP: [name], MD  REFERRING PROVIDER: Jean Rosenthal, MD  REFERRING DIAG: [id number] (ICD-10-CM) - Status post total left knee replacement   THERAPY DIAG:  Acute pain of left knee  Localized edema  Stiffness of left knee, not elsewhere classified  Muscle weakness (generalized)  Other abnormalities of gait and mobility  Cramp and spasm  Rationale for Evaluation and Treatment: Rehabilitation  ONSET DATE: 11/22/2022 TKA sg  SUBJECTIVE:  SUBJECTIVE STATEMENT:  she went to [facility] - did stepper 8 min and bike 12 min needing to lift hip.  She was able to go to church for first time.  [place] more walking.   She drove herself.  PERTINENT HISTORY: OA, lumbar radiculopathy / LBP, osteopenia, obesity, s/p radioactive thyroid ablation, HTN  PAIN:  NPRS scale:   4/10 with range last week 4-6/10 Pain location: Left knee more medial Pain description: sore Aggravating factors: sitting, increasing distance suddenly Relieving factors: laying down with knee ext,  ice & Aleve/tylenol   PRECAUTIONS: None  WEIGHT BEARING RESTRICTIONS: Yes WBAT LLE  FALLS:  Has patient fallen in last 6 months? No  LIVING ENVIRONMENT: Lives with: lives with their spouse Lives in: House single level Stairs: Yes: External: 3 steps; on left going up Has following equipment at home: Single point cane, Environmental consultant - 2 wheeled, Wheelchair (manual), and Grab bars  OCCUPATION: retired  from Retail buyer at [employer],   [place]: Independent  PATIENT GOALS:   improve knees to enable to enjoy retirement including travel  Next MD visit:  01/02/2022   OBJECTIVE:   DIAGNOSTIC FINDINGS: 11/23/2023 Post LEFT total knee arthroplasty without acute complication.   PATIENT SURVEYS:  12/30/2022: FOTO 58% 12/06/2022:  FOTO intake: 38%   predicted:  60%  COGNITION: Overall cognitive status: WFL    SENSATION: WFL  EDEMA:  Circumferential:  LLE above knee 50.7cm  around knee 47cm  below knee 40cm RLE above knee 46.7cm  around knee 43.6cm  below knee 36 cm   POSTURE: flexed trunk  and weight shift right  PALPATION: Tenderness medial knee (quad, joint line & proximal tibia), lateral joint line & incision.   LOWER EXTREMITY ROM:   ROM P:passive  A:active Left eval Left 12/10/12 Left 12/16/22 Left 12/17/22 Left 12/23/22 Left 12/30/22  Hip flexion        Hip extension        Hip abduction        Hip adduction        Hip internal rotation        Hip external rotation        Knee flexion Seated A: 83* P: 94* Supine  Supine A: 90 P: 95 Supine A: 94 P: 98 Supine A: 96 P: 100  Supine A: 100* P: 102* Seated P: 110* A: 104*  Knee extension Seated  A: LAQ -13* Supine P: -8* A: -10* Supine A: -8 P: -4 Supine A: -8 P: -4 Supine A: -5 P: -3 Supine A: -3 P: -2 Seated LAQ -6* Standing  A: 0*  Ankle dorsiflexion        Ankle plantarflexion        Ankle inversion        Ankle eversion         (Blank rows = not tested)  LOWER EXTREMITY MMT:  MMT Left eval  Hip flexion   Hip extension   Hip abduction   Hip adduction   Hip internal rotation   Hip external rotation   Knee flexion 3-/5  Knee extension 3-/5  Ankle dorsiflexion   Ankle plantarflexion   Ankle inversion   Ankle eversion    (Blank rows = not tested)   FUNCTIONAL TESTS:  18 inch chair transfer: requires use of BUEs on armrests, weight shift to RLE, limited knee flex  GAIT: Distance  walked: 100' Assistive device utilized: Single point cane Level of assistance: SBA Comments: LLE stiff leg with flexion in stance & minimal increase knee flex for swing, antalgic with LLE decreased stance time, LLE abducted, trunk flexed   TODAY'S TREATMENT  DATE:  12/30/2022: TherEx:  Precor recumbent bike: seat 7 rocking for flexion stretch 1 min, full revolutions level 1 for 7 min Leg press BLEs 106# 15 reps with 5 sec hold flex & ext;  50# LLE 15 reps Hamstring stretch LLE SLR strap 30 sec hold 2 reps Calf stretch on step heel depression:  holding 30 sec 2 reps Seated green theraband hamstring curl 15 reps Seated red theraband LAQ 15 reps Standing TKE green theraband 15 reps LLE Step up, over stance knee ext & step down BOSU round side up  BUE support (2 step approach to decrease UE use) 15 reps Lateral step up on LLE with RLE reaching across midline anterior & posterior for rotation control BOSU round side up  BUE support 10 reps.  Neuromuscular Re-education: Move LE on slider to 3 cones (ant-lat, lat & post-lat) with stance LE flex on outward motion & ext inward motion 5 reps ea LE.   Self-Care:  Pt verbalizes ice massage & demo scar mobs with minor cues.    Manual PROM: knee flexion and extension with overpressure  Modalities:  Vasopneumatic: x 10 minutes, medium compression 34 degrees, Left knee   12/26/2022: TherEx:  Precor recumbent bike: seat 6 rocking for flexion stretch 4 min, backwards full revolutions 4 min Leg press BLEs 100# 15 reps with 5 sec hold flex & ext;  50# LLE 15 reps Hamstring stretch LLE SLR strap 30 sec hold 2 reps Squat at sink 5 sec hold 15 reps Calf stretch on step heel depression:  holding 30 sec 2 reps Calf raises BLE concentric & RLE eccentric x 20 light UE support Seated green theraband hamstring curl 15 reps Seated red theraband LAQ 15 reps Standing TKE green theraband 15  reps Supine holding femur vertical - knee flexion 10 sec then pushing heel to ceiling for hamstring stretch 10 sec for 10 reps PT updated HEP. Pt verbalized & return demo understanding.    Manual PROM: knee flexion and extension with overpressure  Modalities:  Vasopneumatic: x 10 minutes, medium compression 34 degrees, Left knee   12/25/2022: TherEx:  SciFit recumbent bike: seat 9 Level 1 x 8 minutes c BLEs only Leg press BLEs 87# 15 reps with 5 sec hold flex & ext;  43# LLE 15 reps Calf stretch on step heel depression:  holding 30 sec 2 reps Calf raises BLE concentric & RLE eccentric x 20 no UE support Hamstring stretch LLE AAROM SLR strap 30 sec hold 2 reps LLE Lateral step up on 6" step RUE support 15 reps Supine holding femur vertical - knee flexion 10 sec then pushing heel to ceiling for hamstring stretch 10 sec for 10 reps   Self-Care: PT demo & verbal cues on scar mobs. Pt verbalized understanding.  PT demo & verbal cues on ice massage. Pt verbalized & return demo understanding.   Manual PROM: knee flexion and extension with overpressure  Modalities:  Vasopneumatic: x 10 minutes, medium compression 34 degrees, Left knee   PATIENT EDUCATION:  Education details: HEP, POC Person educated: Patient & husband Education method: Explanation, Demonstration, Verbal cues, and Handouts Education comprehension: verbalized understanding, returned demonstration, and verbal cues required  HOME EXERCISE PROGRAM: Access Code: [facility] URL: [email or web address] Date: 12/26/2022 Prepared by: Jamey Reas  Exercises - Ankle Alphabet in Elevation  - 2-4 x daily - 7 x weekly - 1 sets - 1 reps - Quad Setting and Stretching  - 2-4 x daily - 7 x weekly - 5-10 sets -  10 reps - prop 5-10 minutes & quad set5 seconds hold - Supine Heel Slide with Strap  - 2-3 x daily - 7 x weekly - 2-3 sets - 10 reps - 5 seconds hold - Supine Knee Extension Strengthening  - 2-3 x daily - 7 x  weekly - 2-3 sets - 10 reps - 5 seconds hold - Supine Straight Leg Raises  - 2-3 x daily - 7 x weekly - 2-3 sets - 10 reps - 5 seconds hold - Seated Knee Flexion Extension AROM   - 2-4 x daily - 7 x weekly - 2-3 sets - 10 reps - 5 seconds hold - Seated Long Arc Quad  - 2-3 x daily - 7 x weekly - 2-3 sets - 10 reps - 5 seconds hold - Seated straight leg lifts  - 2-3 x daily - 7 x weekly - 2-3 sets - 10 reps - 5 seconds hold - Seated Hamstring Stretch with Strap  - 2-4 x daily - 7 x weekly - 1 sets - 3 reps - 20-30 seconds hold - Standing Eccentric Heel Raise  - 2 x daily - 7 x weekly - 1 sets - 20 reps - 5 seconds hold - Gastroc Stretch on Step  - 2 x daily - 7 x weekly - 1 sets - 3 reps - 20-30 seconds hold - Standing Soleus Stretch on Step with Counter Support  - 2 x daily - 7 x weekly - 1 sets - 3 reps - 20-30 seconds hold - Squat with Counter Support  - 2 x daily - 7 x weekly - 2 sets - 10 reps - 5 seconds hold - Seated Hamstring Curl with Anchored Resistance  - 1 x daily - 7 x weekly - 2-3 sets - 10 reps - 5 seconds hold - Sitting Knee Extension with Resistance  - 1 x daily - 7 x weekly - 2-3 sets - 10 reps - 5 seconds hold - Standing Terminal Knee Extension with Resistance  - 1 x daily - 7 x weekly - 2-3 sets - 10 reps - 5 seconds hold  ASSESSMENT: CLINICAL IMPRESSION: Pt. has attended 10 visits overall during course of treatment.  See objective data for updated information.  Pt. has made good gains to this point c mild defiicts noted in Rt knee AROM and movement coordination.  Pt. may continue to benefit from skilled PT services to continue progression towards reaching established goals and reduced difficulty due to presentation  OBJECTIVE IMPAIRMENTS: Abnormal gait, decreased activity tolerance, decreased balance, decreased endurance, decreased knowledge of condition, decreased knowledge of use of DME, decreased mobility, difficulty walking, decreased ROM, decreased strength, increased  edema, increased muscle spasms, impaired flexibility, obesity, and pain.   ACTIVITY LIMITATIONS: carrying, lifting, bending, sitting, standing, squatting, sleeping, stairs, transfers, and locomotion level  PARTICIPATION LIMITATIONS: meal prep, cleaning, laundry, driving, and community activity  PERSONAL FACTORS: Fitness and 1-2 comorbidities: see PMH  are also affecting patient's functional outcome.   REHAB POTENTIAL: Good  CLINICAL DECISION MAKING: Stable/uncomplicated  EVALUATION COMPLEXITY: Low   GOALS: Goals reviewed with patient? Yes  SHORT TERM GOALS: (target date for Short term goals are 4 weeks 01/03/2023)   1.  Patient will demonstrate independent use of home exercise program to maintain progress from in clinic treatments.  Goal status: MET 12/30/22  LONG TERM GOALS: (target dates for all long term goals are 10 weeks  02/14/2023 )   1. Patient will demonstrate/report pain at worst less than or equal  to 2/10 to facilitate minimal limitation in daily activity secondary to pain symptoms.  Goal status: New   2. Patient will demonstrate independent use of home exercise program to facilitate ability to maintain/progress functional gains from skilled physical therapy services.  Goal status: New   3. Patient will demonstrate FOTO outcome > or = 60 % to indicate reduced disability due to condition.  Goal status: New   4.  Patient will demonstrate LE MMT 5/5 throughout to faciltiate usual transfers, stairs, squatting at [facility] for daily life.   Goal status: New   5.  Patient ambulates >500' & negotiates ramps, curbs & stairs single rail without assistive device independently. Goal status: New     PLAN:  PT FREQUENCY: 2-3x/week  PT DURATION: 10 weeks  PLANNED INTERVENTIONS: Therapeutic exercises, Therapeutic activity, Neuromuscular re-education, Balance training, Gait training, Patient/Family education, Self Care, Joint mobilization, Stair training, DME instructions,  Electrical stimulation, Cryotherapy, Moist heat, scar mobilization, Taping, Vasopneumatic device, Ultrasound, Ionotophoresis '4mg'$ /ml Dexamethasone, and Manual therapy  PLAN FOR NEXT SESSION:   when pain decreases, use dynameter to measure strength, Manual therapy & there exer for range, standing balance & functional activities.  vaso to end   Jamey Reas, PT, DPT 12/30/2022, 12:36 PM

## 2022-12-31 ENCOUNTER — Other Ambulatory Visit (HOSPITAL_COMMUNITY): Payer: Self-pay

## 2022-12-31 MED ORDER — SYNTHROID 100 MCG PO TABS
100.0000 ug | ORAL_TABLET | Freq: Every morning | ORAL | 4 refills | Status: DC
  Filled 2022-12-31: qty 90, 90d supply, fill #0
  Filled 2023-04-05: qty 90, 90d supply, fill #1
  Filled 2023-06-29: qty 90, 90d supply, fill #2
  Filled 2023-09-30 (×2): qty 90, 90d supply, fill #3
  Filled 2023-12-28: qty 90, 90d supply, fill #4

## 2023-01-01 ENCOUNTER — Ambulatory Visit (INDEPENDENT_AMBULATORY_CARE_PROVIDER_SITE_OTHER): Payer: Medicare Other | Admitting: Physical Therapy

## 2023-01-01 ENCOUNTER — Encounter: Payer: Self-pay | Admitting: Physical Therapy

## 2023-01-01 DIAGNOSIS — M6281 Muscle weakness (generalized): Secondary | ICD-10-CM

## 2023-01-01 DIAGNOSIS — R600 Localized edema: Secondary | ICD-10-CM

## 2023-01-01 DIAGNOSIS — R2689 Other abnormalities of gait and mobility: Secondary | ICD-10-CM

## 2023-01-01 DIAGNOSIS — M25662 Stiffness of left knee, not elsewhere classified: Secondary | ICD-10-CM

## 2023-01-01 DIAGNOSIS — M25562 Pain in left knee: Secondary | ICD-10-CM | POA: Diagnosis not present

## 2023-01-01 NOTE — Therapy (Signed)
OUTPATIENT PHYSICAL THERAPY TREATMENT NOTE    Patient Name: Joan [name] MRN: [id number] DOB:12/12/1955, 67 y.o., female Today's Date: 01/01/2023   END OF SESSION:  PT End of Session - 01/01/23 1135     Visit Number 11    Number of Visits 25    Date for PT Re-Evaluation 02/14/23    Authorization Type Medicare & [employer] sup    Progress Note Due on Visit 10    PT Start Time 1135    PT Stop Time 1230    PT Time Calculation (min) 55 min    Equipment Utilized During Treatment Gait belt    Activity Tolerance Patient tolerated treatment well;Patient limited by pain    Behavior During Therapy WFL for tasks assessed/performed                  Past Medical History:  Diagnosis Date   Allergy    Anemia    due to Dysmenorrhea   Arthritis    generalized    Hypertension    initially after BCP initiated    Hypothyroidism    PONV (postoperative nausea and vomiting)    Thyroid disease 2005 & 2009   nodule   Past Surgical History:  Procedure Laterality Date   ABDOMINAL HYSTERECTOMY  1998   for heavy menses and fibroids   BIOPSY THYROID  2005,2009   both negative   CESAREAN SECTION     x2   COLONOSCOPY  2009    Dr [facility]   EXPLORATORY LAPAROTOMY  1985   Ovarian cyst   [place]    LLQ pain due to ovarian cyst   SIGMOIDOSCOPY     TONSILLECTOMY     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   Procedure: LEFT TOTAL KNEE ARTHROPLASTY;  Surgeon: Mcarthur Rossetti, MD;  Location: [facility];  Service: Orthopedics;  Laterality: Left;   Patient Active Problem List   Diagnosis Date Noted   Status post total left knee replacement 11/22/2022   Unilateral primary osteoarthritis, left knee 09/25/2022   Degenerative arthritis of left knee 08/20/2021   Degenerative spondylolisthesis 03/31/2020   Lumbar radiculopathy 03/28/2020   Mid back pain on right side 09/28/2018   Osteopenia 08/19/2018   Hyperglycemia 07/09/2018   Hypothyroidism  06/30/2017   Gallbladder polyp 05/20/2016   Fatty liver 05/20/2016   Obesity 05/01/2016   Vitamin D deficiency 07/14/2013   LOW BACK PAIN SYNDROME 01/23/2010   Status post radioactive iodine thyroid ablation 08/17/2009   Hyperlipidemia 07/26/2008   Essential hypertension 07/26/2008   Pain in joint of left shoulder 07/26/2008   Goiter 07/14/2007    PCP: [name], MD  REFERRING PROVIDER: Jean Rosenthal, MD  REFERRING DIAG: [phone] (ICD-10-CM) - Status post total left knee replacement   THERAPY DIAG:  Acute pain of left knee  Stiffness of left knee, not elsewhere classified  Localized edema  Muscle weakness (generalized)  Other abnormalities of gait and mobility  Rationale for Evaluation and Treatment: Rehabilitation  ONSET DATE: 11/22/2022 TKA sg  SUBJECTIVE:  SUBJECTIVE STATEMENT: She went to [facility] using stepper initially progressing knee flexion by moving seat closer then recumbent bike for total of 20-25 min.     PERTINENT HISTORY: OA, lumbar radiculopathy / LBP, osteopenia, obesity, s/p radioactive thyroid ablation, HTN  PAIN:  NPRS scale:    4/10 with range last week 3-5/10 Pain location: Left knee more medial Pain description: sore Aggravating factors: sitting, increasing distance suddenly Relieving factors: laying down  with knee ext,  ice & Aleve/tylenol   PRECAUTIONS: None  WEIGHT BEARING RESTRICTIONS: Yes WBAT LLE  FALLS:  Has patient fallen in last 6 months? No  LIVING ENVIRONMENT: Lives with: lives with their spouse Lives in: House single level Stairs: Yes: External: 3 steps; on left going up Has following equipment at home: Single point cane, Environmental consultant - 2 wheeled, Wheelchair (manual), and Grab bars  OCCUPATION: retired from Retail buyer at [employer],   [place]: Independent  PATIENT GOALS:   improve knees to enable to enjoy retirement including travel  Next MD visit:  01/02/2022   OBJECTIVE:   DIAGNOSTIC FINDINGS: 11/23/2023 Post LEFT  total knee arthroplasty without acute complication.   PATIENT SURVEYS:  12/30/2022: FOTO 58% 12/06/2022:  FOTO intake: 38%   predicted:  60%  COGNITION: Overall cognitive status: WFL    SENSATION: WFL  EDEMA:  Circumferential:  LLE above knee 50.7cm  around knee 47cm  below knee 40cm RLE above knee 46.7cm  around knee 43.6cm  below knee 36 cm   POSTURE: flexed trunk  and weight shift right  PALPATION: Tenderness medial knee (quad, joint line & proximal tibia), lateral joint line & incision.   LOWER EXTREMITY ROM:   ROM P:passive  A:active Left eval Left 12/10/12 Left 12/16/22 Left 12/17/22 Left 12/23/22 Left 12/30/22  Hip flexion        Hip extension        Hip abduction        Hip adduction        Hip internal rotation        Hip external rotation        Knee flexion Seated A: 83* P: 94* Supine  Supine A: 90 P: 95 Supine A: 94 P: 98 Supine A: 96 P: 100  Supine A: 100* P: 102* Seated P: 110* A: 104*  Knee extension Seated  A: LAQ -13* Supine P: -8* A: -10* Supine A: -8 P: -4 Supine A: -8 P: -4 Supine A: -5 P: -3 Supine A: -3 P: -2 Seated LAQ -6* Standing  A: 0*  Ankle dorsiflexion        Ankle plantarflexion        Ankle inversion        Ankle eversion         (Blank rows = not tested)  LOWER EXTREMITY MMT:  MMT Left eval  Hip flexion   Hip extension   Hip abduction   Hip adduction   Hip internal rotation   Hip external rotation   Knee flexion 3-/5  Knee extension 3-/5  Ankle dorsiflexion   Ankle plantarflexion   Ankle inversion   Ankle eversion    (Blank rows = not tested)   FUNCTIONAL TESTS:  18 inch chair transfer: requires use of BUEs on armrests, weight shift to RLE, limited knee flex  GAIT: Distance walked: 100' Assistive device utilized: Single point cane Level of assistance: SBA Comments: LLE stiff leg with flexion in stance & minimal increase knee flex for swing, antalgic with LLE decreased stance time, LLE  abducted, trunk flexed   TODAY'S TREATMENT  DATE:  01/01/2023: TherEx:  Precor recumbent bike: seat 7 rocking for flexion stretch 1 min, full revolutions level 1 for 7 min Leg press BLEs 112# 15 reps with 5 sec hold flex & ext;  56# LLE 15 reps; marching for SLS knee stability 75# 5 rep march 3 sets. Hamstring stretch LLE SLR strap 30 sec hold 2 reps Calf stretch on incline board:  holding 30 sec 2 reps Heel raises on incline board 15 reps light BUE support LLE Step up, over stance knee ext & step down BOSU round side up  BUE support (2 step approach to decrease UE use) 15 reps Lateral step up on LLE with RLE reaching across midline anterior & posterior for rotation control BOSU round side up  BUE support 15 reps. Functional squat 5 reps picking up items; Squat touching buttocks to chair for depth & safety 15 reps. Knee ext machine BLEs 20# 10 reps; 5# BLE concentric, LLE isometric 3 sec & eccentric 10 reps;  LLE concentric & eccentri 5# 10 reps Knee flexion machine BLEs 35# 15 reps;  LLE only 15# 15 reps Stairs 11 steps alternating pattern descend with right rail & ascend without rail.   PT recommending walking program progressing distance by time.  Walk out 7 min & back for total 14 min.  Add time as tolerated.  She plans to go to [place] end of March which can requires ~7 miles per day walking. She does plan to rent w/c.  Pt verbalized understanding.    Neuromuscular Re-education: Move LE on slider to 3 cones (ant-lat, lat & post-lat) with stance LE flex on outward motion & ext inward motion 5 reps ea LE.     Modalities:  Vasopneumatic: x 10 minutes, medium compression 34 degrees, Left knee  12/30/2022: TherEx:  Precor recumbent bike: seat 7 rocking for flexion stretch 1 min, full revolutions level 1 for 7 min Leg press BLEs 106# 15 reps with 5 sec hold flex & ext;  50# LLE 15 reps Hamstring stretch LLE SLR strap 30  sec hold 2 reps Calf stretch on step heel depression:  holding 30 sec 2 reps Seated green theraband hamstring curl 15 reps Seated red theraband LAQ 15 reps Standing TKE green theraband 15 reps LLE Step up, over stance knee ext & step down BOSU round side up  BUE support (2 step approach to decrease UE use) 15 reps Lateral step up on LLE with RLE reaching across midline anterior & posterior for rotation control BOSU round side up  BUE support 10 reps.  Neuromuscular Re-education: Move LE on slider to 3 cones (ant-lat, lat & post-lat) with stance LE flex on outward motion & ext inward motion 5 reps ea LE.   Self-Care:  Pt verbalizes ice massage & demo scar mobs with minor cues.    Manual PROM: knee flexion and extension with overpressure  Modalities:  Vasopneumatic: x 10 minutes, medium compression 34 degrees, Left knee   12/26/2022: TherEx:  Precor recumbent bike: seat 6 rocking for flexion stretch 4 min, backwards full revolutions 4 min Leg press BLEs 100# 15 reps with 5 sec hold flex & ext;  50# LLE 15 reps Hamstring stretch LLE SLR strap 30 sec hold 2 reps Squat at sink 5 sec hold 15 reps Calf stretch on step heel depression:  holding 30 sec 2 reps Calf raises BLE concentric & RLE eccentric x 20 light UE support Seated green theraband hamstring curl 15 reps Seated red theraband LAQ 15 reps  Standing TKE green theraband 15 reps Supine holding femur vertical - knee flexion 10 sec then pushing heel to ceiling for hamstring stretch 10 sec for 10 reps PT updated HEP. Pt verbalized & return demo understanding.    Manual PROM: knee flexion and extension with overpressure  Modalities:  Vasopneumatic: x 10 minutes, medium compression 34 degrees, Left knee    PATIENT EDUCATION:  Education details: HEP, POC Person educated: Patient & husband Education method: Explanation, Demonstration, Verbal cues, and Handouts Education comprehension: verbalized understanding, returned  demonstration, and verbal cues required  HOME EXERCISE PROGRAM: Access Code: [facility] URL: [email or web address] Date: 12/26/2022 Prepared by: Jamey Reas  Exercises - Ankle Alphabet in Elevation  - 2-4 x daily - 7 x weekly - 1 sets - 1 reps - Quad Setting and Stretching  - 2-4 x daily - 7 x weekly - 5-10 sets - 10 reps - prop 5-10 minutes & quad set5 seconds hold - Supine Heel Slide with Strap  - 2-3 x daily - 7 x weekly - 2-3 sets - 10 reps - 5 seconds hold - Supine Knee Extension Strengthening  - 2-3 x daily - 7 x weekly - 2-3 sets - 10 reps - 5 seconds hold - Supine Straight Leg Raises  - 2-3 x daily - 7 x weekly - 2-3 sets - 10 reps - 5 seconds hold - Seated Knee Flexion Extension AROM   - 2-4 x daily - 7 x weekly - 2-3 sets - 10 reps - 5 seconds hold - Seated Long Arc Quad  - 2-3 x daily - 7 x weekly - 2-3 sets - 10 reps - 5 seconds hold - Seated straight leg lifts  - 2-3 x daily - 7 x weekly - 2-3 sets - 10 reps - 5 seconds hold - Seated Hamstring Stretch with Strap  - 2-4 x daily - 7 x weekly - 1 sets - 3 reps - 20-30 seconds hold - Standing Eccentric Heel Raise  - 2 x daily - 7 x weekly - 1 sets - 20 reps - 5 seconds hold - Gastroc Stretch on Step  - 2 x daily - 7 x weekly - 1 sets - 3 reps - 20-30 seconds hold - Standing Soleus Stretch on Step with Counter Support  - 2 x daily - 7 x weekly - 1 sets - 3 reps - 20-30 seconds hold - Squat with Counter Support  - 2 x daily - 7 x weekly - 2 sets - 10 reps - 5 seconds hold - Seated Hamstring Curl with Anchored Resistance  - 1 x daily - 7 x weekly - 2-3 sets - 10 reps - 5 seconds hold - Sitting Knee Extension with Resistance  - 1 x daily - 7 x weekly - 2-3 sets - 10 reps - 5 seconds hold - Standing Terminal Knee Extension with Resistance  - 1 x daily - 7 x weekly - 2-3 sets - 10 reps - 5 seconds hold  ASSESSMENT: CLINICAL IMPRESSION: Patient appears to be ambulating with better fluency.  She is improving knee strength  with progressive exercises.   OBJECTIVE IMPAIRMENTS: Abnormal gait, decreased activity tolerance, decreased balance, decreased endurance, decreased knowledge of condition, decreased knowledge of use of DME, decreased mobility, difficulty walking, decreased ROM, decreased strength, increased edema, increased muscle spasms, impaired flexibility, obesity, and pain.   ACTIVITY LIMITATIONS: carrying, lifting, bending, sitting, standing, squatting, sleeping, stairs, transfers, and locomotion level  PARTICIPATION LIMITATIONS: meal prep, cleaning, laundry, driving, and community activity  PERSONAL FACTORS: Fitness and 1-2 comorbidities: see PMH  are also affecting patient's functional outcome.   REHAB POTENTIAL: Good  CLINICAL DECISION MAKING: Stable/uncomplicated  EVALUATION COMPLEXITY: Low   GOALS: Goals reviewed with patient? Yes  SHORT TERM GOALS: (target date for Short term goals are 4 weeks 01/03/2023)   1.  Patient will demonstrate independent use of home exercise program to maintain progress from in clinic treatments.  Goal status: MET 12/30/22  LONG TERM GOALS: (target dates for all long term goals are 10 weeks  02/14/2023 )   1. Patient will demonstrate/report pain at worst less than or equal to 2/10 to facilitate minimal limitation in daily activity secondary to pain symptoms.  Goal status: New   2. Patient will demonstrate independent use of home exercise program to facilitate ability to maintain/progress functional gains from skilled physical therapy services.  Goal status: New   3. Patient will demonstrate FOTO outcome > or = 60 % to indicate reduced disability due to condition.  Goal status: New   4.  Patient will demonstrate LE MMT 5/5 throughout to faciltiate usual transfers, stairs, squatting at [facility] for daily life.   Goal status: New   5.  Patient ambulates >500' & negotiates ramps, curbs & stairs single rail without assistive device independently. Goal status: New      PLAN:  PT FREQUENCY: 2-3x/week  PT DURATION: 10 weeks  PLANNED INTERVENTIONS: Therapeutic exercises, Therapeutic activity, Neuromuscular re-education, Balance training, Gait training, Patient/Family education, Self Care, Joint mobilization, Stair training, DME instructions, Electrical stimulation, Cryotherapy, Moist heat, scar mobilization, Taping, Vasopneumatic device, Ultrasound, Ionotophoresis '4mg'$ /ml Dexamethasone, and Manual therapy  PLAN FOR NEXT SESSION:   when pain decreases, use dynameter to measure strength, Manual therapy & there exer for range, standing balance & functional activities.  vaso to end   Jamey Reas, PT, DPT 01/01/2023, 12:30 PM

## 2023-01-02 ENCOUNTER — Encounter: Payer: Self-pay | Admitting: Orthopaedic Surgery

## 2023-01-02 ENCOUNTER — Ambulatory Visit (INDEPENDENT_AMBULATORY_CARE_PROVIDER_SITE_OTHER): Payer: Medicare Other | Admitting: Orthopaedic Surgery

## 2023-01-02 DIAGNOSIS — Z96652 Presence of left artificial knee joint: Secondary | ICD-10-CM

## 2023-01-02 NOTE — Progress Notes (Signed)
Patient is a 67 year old female who is now 6 weeks status post a left total knee arthroplasty.  She is only taken Aleve for pain and swelling.  She says she is doing better overall.  She has driven as well.  Shirlean Mylar upstairs with physical therapy is been working with her.  On my exam today her extension is almost full and her flexion is to around 100 degrees.  The knee feels stable ligamentously.  There is still swelling to be expected.  Incision x-ray looks good.  I saw one area that dimples a little bit but it is not concerning.  I am fine with her putting any types of scar creams on her incision at this point.  I would like her to continue physical therapy for likely another 2 to 4 weeks in order to maximize her mobility and motion.  She agrees with this as well.  Will see her back in 4 weeks to see how she is doing overall.

## 2023-01-06 ENCOUNTER — Ambulatory Visit (INDEPENDENT_AMBULATORY_CARE_PROVIDER_SITE_OTHER): Payer: Medicare Other | Admitting: Physical Therapy

## 2023-01-06 ENCOUNTER — Encounter: Payer: Self-pay | Admitting: Physical Therapy

## 2023-01-06 DIAGNOSIS — M6281 Muscle weakness (generalized): Secondary | ICD-10-CM | POA: Diagnosis not present

## 2023-01-06 DIAGNOSIS — M25662 Stiffness of left knee, not elsewhere classified: Secondary | ICD-10-CM | POA: Diagnosis not present

## 2023-01-06 DIAGNOSIS — R2689 Other abnormalities of gait and mobility: Secondary | ICD-10-CM | POA: Diagnosis not present

## 2023-01-06 DIAGNOSIS — M25562 Pain in left knee: Secondary | ICD-10-CM

## 2023-01-06 DIAGNOSIS — R600 Localized edema: Secondary | ICD-10-CM

## 2023-01-06 NOTE — Therapy (Signed)
OUTPATIENT PHYSICAL THERAPY TREATMENT NOTE    Patient Name: Joan [name] MRN: [id number] DOB:1956-08-06, 67 y.o., female Today's Date: 01/06/2023   END OF SESSION:  PT End of Session - 01/06/23 1143     Visit Number 12    Number of Visits 25    Date for PT Re-Evaluation 02/14/23    Authorization Type Medicare & [employer] sup    Progress Note Due on Visit 10    PT Start Time 1144    PT Stop Time 1235    PT Time Calculation (min) 51 min    Equipment Utilized During Treatment Gait belt    Activity Tolerance Patient tolerated treatment well;Patient limited by pain    Behavior During Therapy WFL for tasks assessed/performed                  Past Medical History:  Diagnosis Date   Allergy    Anemia    due to Dysmenorrhea   Arthritis    generalized    Hypertension    initially after BCP initiated    Hypothyroidism    PONV (postoperative nausea and vomiting)    Thyroid disease 2005 & 2009   nodule   Past Surgical History:  Procedure Laterality Date   ABDOMINAL HYSTERECTOMY  1998   for heavy menses and fibroids   BIOPSY THYROID  2005,2009   both negative   CESAREAN SECTION     x2   COLONOSCOPY  2009    Dr [facility]   EXPLORATORY LAPAROTOMY  1985   Ovarian cyst   [place]    LLQ pain due to ovarian cyst   SIGMOIDOSCOPY     TONSILLECTOMY     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   Procedure: LEFT TOTAL KNEE ARTHROPLASTY;  Surgeon: Mcarthur Rossetti, MD;  Location: [facility];  Service: Orthopedics;  Laterality: Left;   Patient Active Problem List   Diagnosis Date Noted   Status post total left knee replacement 11/22/2022   Unilateral primary osteoarthritis, left knee 09/25/2022   Degenerative arthritis of left knee 08/20/2021   Degenerative spondylolisthesis 03/31/2020   Lumbar radiculopathy 03/28/2020   Mid back pain on right side 09/28/2018   Osteopenia 08/19/2018   Hyperglycemia 07/09/2018   Hypothyroidism  06/30/2017   Gallbladder polyp 05/20/2016   Fatty liver 05/20/2016   Obesity 05/01/2016   Vitamin D deficiency 07/14/2013   LOW BACK PAIN SYNDROME 01/23/2010   Status post radioactive iodine thyroid ablation 08/17/2009   Hyperlipidemia 07/26/2008   Essential hypertension 07/26/2008   Pain in joint of left shoulder 07/26/2008   Goiter 07/14/2007    PCP: [name], MD  REFERRING PROVIDER: Jean Rosenthal, MD  REFERRING DIAG: [phone] (ICD-10-CM) - Status post total left knee replacement   THERAPY DIAG:  Acute pain of left knee  Stiffness of left knee, not elsewhere classified  Localized edema  Muscle weakness (generalized)  Other abnormalities of gait and mobility  Rationale for Evaluation and Treatment: Rehabilitation  ONSET DATE: 11/22/2022 TKA sg  SUBJECTIVE:  SUBJECTIVE STATEMENT: she is having difficulty getting bike at [facility] to go all the way around.  She warms up on stepper moving seat closer then switches to bike.  She also tried leg press but the stack weights could only do 20# with both legs.    PERTINENT HISTORY: OA, lumbar radiculopathy / LBP, osteopenia, obesity, s/p radioactive thyroid ablation, HTN  PAIN:  NPRS scale:  this morning  4/10 with range  last week 2-4/10 Pain location: Left knee more medial Pain description: sore Aggravating factors: sitting, increasing distance suddenly Relieving factors: laying down with knee ext,  ice & Aleve/tylenol   PRECAUTIONS: None  WEIGHT BEARING RESTRICTIONS: Yes WBAT LLE  FALLS:  Has patient fallen in last 6 months? No  LIVING ENVIRONMENT: Lives with: lives with their spouse Lives in: House single level Stairs: Yes: External: 3 steps; on left going up Has following equipment at home: Single point cane, Environmental consultant - 2 wheeled, Wheelchair (manual), and Grab bars  OCCUPATION: retired from Retail buyer at [employer],   [place]: Independent  PATIENT GOALS:   improve knees to enable to enjoy retirement including  travel  Next MD visit:  01/02/2022   OBJECTIVE:   DIAGNOSTIC FINDINGS: 11/23/2023 Post LEFT total knee arthroplasty without acute complication.   PATIENT SURVEYS:  12/30/2022: FOTO 58% 12/06/2022:  FOTO intake: 38%   predicted:  60%  COGNITION: Overall cognitive status: WFL    SENSATION: WFL  EDEMA:  Circumferential:  LLE above knee 50.7cm  around knee 47cm  below knee 40cm RLE above knee 46.7cm  around knee 43.6cm  below knee 36 cm   POSTURE: flexed trunk  and weight shift right  PALPATION: Tenderness medial knee (quad, joint line & proximal tibia), lateral joint line & incision.   LOWER EXTREMITY ROM:   ROM P:passive  A:active Left eval Left 12/10/12 Left 12/16/22 Left 12/17/22 Left 12/23/22 Left 12/30/22  Hip flexion        Hip extension        Hip abduction        Hip adduction        Hip internal rotation        Hip external rotation        Knee flexion Seated A: 83* P: 94* Supine  Supine A: 90 P: 95 Supine A: 94 P: 98 Supine A: 96 P: 100  Supine A: 100* P: 102* Seated P: 110* A: 104*  Knee extension Seated  A: LAQ -13* Supine P: -8* A: -10* Supine A: -8 P: -4 Supine A: -8 P: -4 Supine A: -5 P: -3 Supine A: -3 P: -2 Seated LAQ -6* Standing  A: 0*  Ankle dorsiflexion        Ankle plantarflexion        Ankle inversion        Ankle eversion         (Blank rows = not tested)  LOWER EXTREMITY MMT:  MMT Left eval  Hip flexion   Hip extension   Hip abduction   Hip adduction   Hip internal rotation   Hip external rotation   Knee flexion 3-/5  Knee extension 3-/5  Ankle dorsiflexion   Ankle plantarflexion   Ankle inversion   Ankle eversion    (Blank rows = not tested)   FUNCTIONAL TESTS:  18 inch chair transfer: requires use of BUEs on armrests, weight shift to RLE, limited knee flex  GAIT: Distance walked: 100' Assistive device utilized: Single point cane Level of assistance: SBA Comments: LLE stiff leg with flexion in  stance & minimal increase knee flex for swing, antalgic with LLE decreased stance time, LLE abducted, trunk flexed   TODAY'S TREATMENT  DATE:  01/06/2023: TherEx:  Precor recumbent bike: seat 7 rocking for flexion stretch 2 min, full revolutions backwards for 2 min then forward level 1 for 4 min Leg press BLEs 118# 15 reps with 5 sec hold flex & ext;   marching for SLS knee stability 81# 10 rep march 2 sets. 62# LLE 15 reps; Hamstring stretch LLE SLR strap 30 sec hold 2 reps Calf stretch on incline board in straddle step:  holding 30 sec 2 reps Heel raises on incline board 15 reps light BUE support Squat at counter with chair behind with PT cues on technique 15 reps   Neuromuscular Re-education: Braiding with counter 8' 3 laps Y-reach 5 reps ea LE with supervision & PT cues on stance knee motions.  Modalities:  Vasopneumatic: x 10 minutes, medium compression 34 degrees, Left knee  01/01/2023: TherEx:  Precor recumbent bike: seat 7 rocking for flexion stretch 1 min, full revolutions level 1 for 7 min Leg press BLEs 112# 15 reps with 5 sec hold flex & ext;  56# LLE 15 reps; marching for SLS knee stability 75# 5 rep march 3 sets. Hamstring stretch LLE SLR strap 30 sec hold 2 reps Calf stretch on incline board:  holding 30 sec 2 reps Heel raises on incline board 15 reps light BUE support LLE Step up, over stance knee ext & step down BOSU round side up  BUE support (2 step approach to decrease UE use) 15 reps Lateral step up on LLE with RLE reaching across midline anterior & posterior for rotation control BOSU round side up  BUE support 15 reps. Functional squat 5 reps picking up items; Squat touching buttocks to chair for depth & safety 15 reps. Knee ext machine BLEs 20# 10 reps; 5# BLE concentric, LLE isometric 3 sec & eccentric 10 reps;  LLE concentric & eccentri 5# 10 reps Knee flexion machine BLEs 35# 15 reps;  LLE  only 15# 15 reps Stairs 11 steps alternating pattern descend with right rail & ascend without rail.   PT recommending walking program progressing distance by time.  Walk out 7 min & back for total 14 min.  Add time as tolerated.  She plans to go to [place] end of March which can requires ~7 miles per day walking. She does plan to rent w/c.  Pt verbalized understanding.    Neuromuscular Re-education: Move LE on slider to 3 cones (ant-lat, lat & post-lat) with stance LE flex on outward motion & ext inward motion 5 reps ea LE.     Modalities:  Vasopneumatic: x 10 minutes, medium compression 34 degrees, Left knee  12/30/2022: TherEx:  Precor recumbent bike: seat 7 rocking for flexion stretch 1 min, full revolutions level 1 for 7 min Leg press BLEs 106# 15 reps with 5 sec hold flex & ext;  50# LLE 15 reps Hamstring stretch LLE SLR strap 30 sec hold 2 reps Calf stretch on step heel depression:  holding 30 sec 2 reps Seated green theraband hamstring curl 15 reps Seated red theraband LAQ 15 reps Standing TKE green theraband 15 reps LLE Step up, over stance knee ext & step down BOSU round side up  BUE support (2 step approach to decrease UE use) 15 reps Lateral step up on LLE with RLE reaching across midline anterior & posterior for rotation control BOSU round side up  BUE support 10 reps.  Neuromuscular Re-education: Move LE on slider to 3 cones (ant-lat, lat & post-lat) with stance LE flex on outward  motion & ext inward motion 5 reps ea LE.   Self-Care:  Pt verbalizes ice massage & demo scar mobs with minor cues.    Manual PROM: knee flexion and extension with overpressure  Modalities:  Vasopneumatic: x 10 minutes, medium compression 34 degrees, Left knee     PATIENT EDUCATION:  Education details: HEP, POC Person educated: Patient & husband Education method: Explanation, Demonstration, Verbal cues, and Handouts Education comprehension: verbalized understanding, returned demonstration,  and verbal cues required  HOME EXERCISE PROGRAM: Access Code: [facility] URL: [email or web address] Date: 12/26/2022 Prepared by: Jamey Reas  Exercises - Ankle Alphabet in Elevation  - 2-4 x daily - 7 x weekly - 1 sets - 1 reps - Quad Setting and Stretching  - 2-4 x daily - 7 x weekly - 5-10 sets - 10 reps - prop 5-10 minutes & quad set5 seconds hold - Supine Heel Slide with Strap  - 2-3 x daily - 7 x weekly - 2-3 sets - 10 reps - 5 seconds hold - Supine Knee Extension Strengthening  - 2-3 x daily - 7 x weekly - 2-3 sets - 10 reps - 5 seconds hold - Supine Straight Leg Raises  - 2-3 x daily - 7 x weekly - 2-3 sets - 10 reps - 5 seconds hold - Seated Knee Flexion Extension AROM   - 2-4 x daily - 7 x weekly - 2-3 sets - 10 reps - 5 seconds hold - Seated Long Arc Quad  - 2-3 x daily - 7 x weekly - 2-3 sets - 10 reps - 5 seconds hold - Seated straight leg lifts  - 2-3 x daily - 7 x weekly - 2-3 sets - 10 reps - 5 seconds hold - Seated Hamstring Stretch with Strap  - 2-4 x daily - 7 x weekly - 1 sets - 3 reps - 20-30 seconds hold - Standing Eccentric Heel Raise  - 2 x daily - 7 x weekly - 1 sets - 20 reps - 5 seconds hold - Gastroc Stretch on Step  - 2 x daily - 7 x weekly - 1 sets - 3 reps - 20-30 seconds hold - Standing Soleus Stretch on Step with Counter Support  - 2 x daily - 7 x weekly - 1 sets - 3 reps - 20-30 seconds hold - Squat with Counter Support  - 2 x daily - 7 x weekly - 2 sets - 10 reps - 5 seconds hold - Seated Hamstring Curl with Anchored Resistance  - 1 x daily - 7 x weekly - 2-3 sets - 10 reps - 5 seconds hold - Sitting Knee Extension with Resistance  - 1 x daily - 7 x weekly - 2-3 sets - 10 reps - 5 seconds hold - Standing Terminal Knee Extension with Resistance  - 1 x daily - 7 x weekly - 2-3 sets - 10 reps - 5 seconds hold  ASSESSMENT: CLINICAL IMPRESSION: Patient improves knee motion with instruction & repetition of exercises & functional activities.  Pt  continues to benefit from skilled PT progressing knee range & function.  OBJECTIVE IMPAIRMENTS: Abnormal gait, decreased activity tolerance, decreased balance, decreased endurance, decreased knowledge of condition, decreased knowledge of use of DME, decreased mobility, difficulty walking, decreased ROM, decreased strength, increased edema, increased muscle spasms, impaired flexibility, obesity, and pain.   ACTIVITY LIMITATIONS: carrying, lifting, bending, sitting, standing, squatting, sleeping, stairs, transfers, and locomotion level  PARTICIPATION LIMITATIONS: meal prep, cleaning, laundry, driving, and community activity  PERSONAL FACTORS: Fitness  and 1-2 comorbidities: see PMH  are also affecting patient's functional outcome.   REHAB POTENTIAL: Good  CLINICAL DECISION MAKING: Stable/uncomplicated  EVALUATION COMPLEXITY: Low   GOALS: Goals reviewed with patient? Yes  SHORT TERM GOALS: (target date for Short term goals are 4 weeks 01/03/2023)   1.  Patient will demonstrate independent use of home exercise program to maintain progress from in clinic treatments.  Goal status: MET 12/30/22  LONG TERM GOALS: (target dates for all long term goals are 10 weeks  02/14/2023 )   1. Patient will demonstrate/report pain at worst less than or equal to 2/10 to facilitate minimal limitation in daily activity secondary to pain symptoms.  Goal status: New   2. Patient will demonstrate independent use of home exercise program to facilitate ability to maintain/progress functional gains from skilled physical therapy services.  Goal status: New   3. Patient will demonstrate FOTO outcome > or = 60 % to indicate reduced disability due to condition.  Goal status: New   4.  Patient will demonstrate LE MMT 5/5 throughout to faciltiate usual transfers, stairs, squatting at [facility] for daily life.   Goal status: New   5.  Patient ambulates >500' & negotiates ramps, curbs & stairs single rail without  assistive device independently. Goal status: New     PLAN:  PT FREQUENCY: 2-3x/week  PT DURATION: 10 weeks  PLANNED INTERVENTIONS: Therapeutic exercises, Therapeutic activity, Neuromuscular re-education, Balance training, Gait training, Patient/Family education, Self Care, Joint mobilization, Stair training, DME instructions, Electrical stimulation, Cryotherapy, Moist heat, scar mobilization, Taping, Vasopneumatic device, Ultrasound, Ionotophoresis 41m/ml Dexamethasone, and Manual therapy  PLAN FOR NEXT SESSION:   check ROM,  when pain decreases, use dynameter to measure strength, Manual therapy & there exer for range, standing balance & functional activities.  vaso to end   RJamey Reas PT, DPT 01/06/2023, 12:50 PM

## 2023-01-08 ENCOUNTER — Encounter: Payer: Medicare Other | Admitting: Physical Therapy

## 2023-01-10 ENCOUNTER — Encounter: Payer: Self-pay | Admitting: Physical Therapy

## 2023-01-10 ENCOUNTER — Ambulatory Visit (INDEPENDENT_AMBULATORY_CARE_PROVIDER_SITE_OTHER): Payer: Medicare Other | Admitting: Physical Therapy

## 2023-01-10 DIAGNOSIS — R2689 Other abnormalities of gait and mobility: Secondary | ICD-10-CM

## 2023-01-10 DIAGNOSIS — M6281 Muscle weakness (generalized): Secondary | ICD-10-CM

## 2023-01-10 DIAGNOSIS — R600 Localized edema: Secondary | ICD-10-CM

## 2023-01-10 DIAGNOSIS — M25562 Pain in left knee: Secondary | ICD-10-CM | POA: Diagnosis not present

## 2023-01-10 DIAGNOSIS — M25662 Stiffness of left knee, not elsewhere classified: Secondary | ICD-10-CM

## 2023-01-10 NOTE — Therapy (Signed)
OUTPATIENT PHYSICAL THERAPY TREATMENT NOTE    Patient Name: Joan [name] MRN: [id number] DOB:Aug 12, 1956, 67 y.o., female Today's Date: 01/10/2023   END OF SESSION:  PT End of Session - 01/10/23 1002     Visit Number 13    Number of Visits 25    Date for PT Re-Evaluation 02/14/23    Authorization Type Medicare & [employer] sup    Progress Note Due on Visit 10    PT Start Time 1000    PT Stop Time 1050    PT Time Calculation (min) 50 min    Equipment Utilized During Treatment Gait belt    Activity Tolerance Patient tolerated treatment well;Patient limited by pain    Behavior During Therapy WFL for tasks assessed/performed                   Past Medical History:  Diagnosis Date   Allergy    Anemia    due to Dysmenorrhea   Arthritis    generalized    Hypertension    initially after BCP initiated    Hypothyroidism    PONV (postoperative nausea and vomiting)    Thyroid disease 2005 & 2009   nodule   Past Surgical History:  Procedure Laterality Date   ABDOMINAL HYSTERECTOMY  1998   for heavy menses and fibroids   BIOPSY THYROID  2005,2009   both negative   CESAREAN SECTION     x2   COLONOSCOPY  2009    Dr [facility]   EXPLORATORY LAPAROTOMY  1985   Ovarian cyst   [place]    LLQ pain due to ovarian cyst   SIGMOIDOSCOPY     TONSILLECTOMY     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   Procedure: LEFT TOTAL KNEE ARTHROPLASTY;  Surgeon: Mcarthur Rossetti, MD;  Location: [facility];  Service: Orthopedics;  Laterality: Left;   Patient Active Problem List   Diagnosis Date Noted   Status post total left knee replacement 11/22/2022   Unilateral primary osteoarthritis, left knee 09/25/2022   Degenerative arthritis of left knee 08/20/2021   Degenerative spondylolisthesis 03/31/2020   Lumbar radiculopathy 03/28/2020   Mid back pain on right side 09/28/2018   Osteopenia 08/19/2018   Hyperglycemia 07/09/2018   Hypothyroidism  06/30/2017   Gallbladder polyp 05/20/2016   Fatty liver 05/20/2016   Obesity 05/01/2016   Vitamin D deficiency 07/14/2013   LOW BACK PAIN SYNDROME 01/23/2010   Status post radioactive iodine thyroid ablation 08/17/2009   Hyperlipidemia 07/26/2008   Essential hypertension 07/26/2008   Pain in joint of left shoulder 07/26/2008   Goiter 07/14/2007    PCP: [name], MD  REFERRING PROVIDER: Jean Rosenthal, MD  REFERRING DIAG: [phone] (ICD-10-CM) - Status post total left knee replacement   THERAPY DIAG:  Acute pain of left knee  Stiffness of left knee, not elsewhere classified  Localized edema  Muscle weakness (generalized)  Other abnormalities of gait and mobility  Rationale for Evaluation and Treatment: Rehabilitation  ONSET DATE: 11/22/2022 TKA sg  SUBJECTIVE:  SUBJECTIVE STATEMENT: still having a little pain; sleeping is difficult     PERTINENT HISTORY: OA, lumbar radiculopathy / LBP, osteopenia, obesity, s/p radioactive thyroid ablation, HTN  PAIN:  NPRS scale:  this morning  3/10 with range last week 2-4/10 Pain location: Left knee more medial Pain description: sore Aggravating factors: sitting, increasing distance suddenly Relieving factors: laying down with knee ext,  ice & Aleve/tylenol   PRECAUTIONS: None  WEIGHT BEARING RESTRICTIONS: Yes WBAT LLE  FALLS:  Has patient fallen in last 6 months? No  LIVING ENVIRONMENT: Lives with: lives with their spouse Lives in: House single level Stairs: Yes: External: 3 steps; on left going up Has following equipment at home: Single point cane, Environmental consultant - 2 wheeled, Wheelchair (manual), and Grab bars  OCCUPATION: retired from Retail buyer at [employer],   [place]: Independent  PATIENT GOALS:   improve knees to enable to enjoy retirement including travel  Next MD visit:  01/02/2022   OBJECTIVE:   DIAGNOSTIC FINDINGS: 11/23/2023 Post LEFT total knee arthroplasty without acute complication.   PATIENT SURVEYS:   12/30/2022: FOTO 58% 12/06/2022:  FOTO intake: 38%   predicted:  60%  SENSATION: WFL  EDEMA:  Circumferential:  LLE above knee 50.7cm  around knee 47cm  below knee 40cm RLE above knee 46.7cm  around knee 43.6cm  below knee 36 cm   POSTURE: flexed trunk  and weight shift right  PALPATION: Tenderness medial knee (quad, joint line & proximal tibia), lateral joint line & incision.   LOWER EXTREMITY ROM:   ROM P:passive  A:active Left eval Left 12/10/12 Left 12/16/22 Left 12/17/22 Left 12/23/22 Left 12/30/22 Left 01/10/23  Knee flexion Seated A: 83* P: 94* Supine  Supine A: 90 P: 95 Supine A: 94 P: 98 Supine A: 96 P: 100  Supine A: 100* P: 102* Seated P: 110* A: 104* Supine A: 111  Knee extension Seated  A: LAQ -13* Supine P: -8* A: -10* Supine A: -8 P: -4 Supine A: -8 P: -4 Supine A: -5 P: -3 Supine A: -3 P: -2 Seated LAQ -6* Standing  A: 0*    (Blank rows = not tested)  LOWER EXTREMITY MMT:  MMT Left eval  Knee flexion 3-/5  Knee extension 3-/5   (Blank rows = not tested)   FUNCTIONAL TESTS:  18 inch chair transfer: requires use of BUEs on armrests, weight shift to RLE, limited knee flex  GAIT: Distance walked: 100' Assistive device utilized: Single point cane Level of assistance: SBA Comments: LLE stiff leg with flexion in stance & minimal increase knee flex for swing, antalgic with LLE decreased stance time, LLE abducted, trunk flexed   TODAY'S TREATMENT                                                                          DATE:  01/10/23 TherEx:  Precor recumbent bike: seat 7 partial to full revolutions x 8 min Seated LAQ 3x10; Lt with 4# AA heel slides on Lt x 10 reps Calf stretch on incline board holding 30 sec 2 reps Heel raises on incline board 20 reps light BUE support LLE on 4" step with Rt heel taps 2x10 Squats 2x10 with min cues for technique at counter  Modalities:  Vasopneumatic: x 10 minutes, medium compression 34  degrees, Left knee  01/06/2023: TherEx:  Precor recumbent bike: seat 7 rocking for flexion stretch 2 min, full revolutions backwards for 2 min then forward level 1 for 4 min Leg press BLEs 118# 15 reps with 5 sec hold flex & ext;   marching for SLS knee stability 81# 10 rep march 2 sets. 62# LLE 15 reps; Hamstring stretch  LLE SLR strap 30 sec hold 2 reps Calf stretch on incline board in straddle step:  holding 30 sec 2 reps Heel raises on incline board 15 reps light BUE support Squat at counter with chair behind with PT cues on technique 15 reps   Neuromuscular Re-education: Braiding with counter 8' 3 laps Y-reach 5 reps ea LE with supervision & PT cues on stance knee motions.  Modalities:  Vasopneumatic: x 10 minutes, medium compression 34 degrees, Left knee  01/01/2023: TherEx:  Precor recumbent bike: seat 7 rocking for flexion stretch 1 min, full revolutions level 1 for 7 min Leg press BLEs 112# 15 reps with 5 sec hold flex & ext;  56# LLE 15 reps; marching for SLS knee stability 75# 5 rep march 3 sets. Hamstring stretch LLE SLR strap 30 sec hold 2 reps Calf stretch on incline board:  holding 30 sec 2 reps Heel raises on incline board 15 reps light BUE support LLE Step up, over stance knee ext & step down BOSU round side up  BUE support (2 step approach to decrease UE use) 15 reps Lateral step up on LLE with RLE reaching across midline anterior & posterior for rotation control BOSU round side up  BUE support 15 reps. Functional squat 5 reps picking up items; Squat touching buttocks to chair for depth & safety 15 reps. Knee ext machine BLEs 20# 10 reps; 5# BLE concentric, LLE isometric 3 sec & eccentric 10 reps;  LLE concentric & eccentri 5# 10 reps Knee flexion machine BLEs 35# 15 reps;  LLE only 15# 15 reps Stairs 11 steps alternating pattern descend with right rail & ascend without rail.   PT recommending walking program progressing distance by time.  Walk out 7 min & back for  total 14 min.  Add time as tolerated.  She plans to go to [place] end of March which can requires ~7 miles per day walking. She does plan to rent w/c.  Pt verbalized understanding.    Neuromuscular Re-education: Move LE on slider to 3 cones (ant-lat, lat & post-lat) with stance LE flex on outward motion & ext inward motion 5 reps ea LE.     Modalities:  Vasopneumatic: x 10 minutes, medium compression 34 degrees, Left knee  12/30/2022: TherEx:  Precor recumbent bike: seat 7 rocking for flexion stretch 1 min, full revolutions level 1 for 7 min Leg press BLEs 106# 15 reps with 5 sec hold flex & ext;  50# LLE 15 reps Hamstring stretch LLE SLR strap 30 sec hold 2 reps Calf stretch on step heel depression:  holding 30 sec 2 reps Seated green theraband hamstring curl 15 reps Seated red theraband LAQ 15 reps Standing TKE green theraband 15 reps LLE Step up, over stance knee ext & step down BOSU round side up  BUE support (2 step approach to decrease UE use) 15 reps Lateral step up on LLE with RLE reaching across midline anterior & posterior for rotation control BOSU round side up  BUE support 10 reps.  Neuromuscular Re-education: Move LE on slider to 3 cones (ant-lat, lat & post-lat) with stance LE flex on outward motion & ext inward motion 5 reps ea LE.   Self-Care:  Pt verbalizes ice massage & demo scar mobs with minor cues.    Manual PROM: knee flexion and extension with overpressure  Modalities:  Vasopneumatic: x 10 minutes, medium compression 34 degrees, Left knee     PATIENT EDUCATION:  Education details: HEP, POC Person educated:  Patient & husband Education method: Explanation, Demonstration, Verbal cues, and Handouts Education comprehension: verbalized understanding, returned demonstration, and verbal cues required  HOME EXERCISE PROGRAM: Access Code: [facility] URL: [email or web address] Date: 12/26/2022 Prepared by: Jamey Reas  Exercises - Ankle Alphabet  in Elevation  - 2-4 x daily - 7 x weekly - 1 sets - 1 reps - Quad Setting and Stretching  - 2-4 x daily - 7 x weekly - 5-10 sets - 10 reps - prop 5-10 minutes & quad set5 seconds hold - Supine Heel Slide with Strap  - 2-3 x daily - 7 x weekly - 2-3 sets - 10 reps - 5 seconds hold - Supine Knee Extension Strengthening  - 2-3 x daily - 7 x weekly - 2-3 sets - 10 reps - 5 seconds hold - Supine Straight Leg Raises  - 2-3 x daily - 7 x weekly - 2-3 sets - 10 reps - 5 seconds hold - Seated Knee Flexion Extension AROM   - 2-4 x daily - 7 x weekly - 2-3 sets - 10 reps - 5 seconds hold - Seated Long Arc Quad  - 2-3 x daily - 7 x weekly - 2-3 sets - 10 reps - 5 seconds hold - Seated straight leg lifts  - 2-3 x daily - 7 x weekly - 2-3 sets - 10 reps - 5 seconds hold - Seated Hamstring Stretch with Strap  - 2-4 x daily - 7 x weekly - 1 sets - 3 reps - 20-30 seconds hold - Standing Eccentric Heel Raise  - 2 x daily - 7 x weekly - 1 sets - 20 reps - 5 seconds hold - Gastroc Stretch on Step  - 2 x daily - 7 x weekly - 1 sets - 3 reps - 20-30 seconds hold - Standing Soleus Stretch on Step with Counter Support  - 2 x daily - 7 x weekly - 1 sets - 3 reps - 20-30 seconds hold - Squat with Counter Support  - 2 x daily - 7 x weekly - 2 sets - 10 reps - 5 seconds hold - Seated Hamstring Curl with Anchored Resistance  - 1 x daily - 7 x weekly - 2-3 sets - 10 reps - 5 seconds hold - Sitting Knee Extension with Resistance  - 1 x daily - 7 x weekly - 2-3 sets - 10 reps - 5 seconds hold - Standing Terminal Knee Extension with Resistance  - 1 x daily - 7 x weekly - 2-3 sets - 10 reps - 5 seconds hold  ASSESSMENT: CLINICAL IMPRESSION: Pt tolerated session well today with increase in ROM noted today.  Progressing well with PT at this time.  Pt continues to benefit from skilled PT progressing knee range & function.  OBJECTIVE IMPAIRMENTS: Abnormal gait, decreased activity tolerance, decreased balance, decreased endurance,  decreased knowledge of condition, decreased knowledge of use of DME, decreased mobility, difficulty walking, decreased ROM, decreased strength, increased edema, increased muscle spasms, impaired flexibility, obesity, and pain.   ACTIVITY LIMITATIONS: carrying, lifting, bending, sitting, standing, squatting, sleeping, stairs, transfers, and locomotion level  PARTICIPATION LIMITATIONS: meal prep, cleaning, laundry, driving, and community activity  PERSONAL FACTORS: Fitness and 1-2 comorbidities: see PMH  are also affecting patient's functional outcome.   REHAB POTENTIAL: Good  CLINICAL DECISION MAKING: Stable/uncomplicated  EVALUATION COMPLEXITY: Low   GOALS: Goals reviewed with patient? Yes  SHORT TERM GOALS: (target date for Short term goals are 4 weeks 01/03/2023)   1.  Patient will demonstrate  independent use of home exercise program to maintain progress from in clinic treatments.  Goal status: MET 12/30/22  LONG TERM GOALS: (target dates for all long term goals are 10 weeks  02/14/2023 )   1. Patient will demonstrate/report pain at worst less than or equal to 2/10 to facilitate minimal limitation in daily activity secondary to pain symptoms.  Goal status: New   2. Patient will demonstrate independent use of home exercise program to facilitate ability to maintain/progress functional gains from skilled physical therapy services.  Goal status: New   3. Patient will demonstrate FOTO outcome > or = 60 % to indicate reduced disability due to condition.  Goal status: New   4.  Patient will demonstrate LE MMT 5/5 throughout to faciltiate usual transfers, stairs, squatting at [facility] for daily life.   Goal status: New   5.  Patient ambulates >500' & negotiates ramps, curbs & stairs single rail without assistive device independently. Goal status: New     PLAN:  PT FREQUENCY: 2-3x/week  PT DURATION: 10 weeks  PLANNED INTERVENTIONS: Therapeutic exercises, Therapeutic activity,  Neuromuscular re-education, Balance training, Gait training, Patient/Family education, Self Care, Joint mobilization, Stair training, DME instructions, Electrical stimulation, Cryotherapy, Moist heat, scar mobilization, Taping, Vasopneumatic device, Ultrasound, Ionotophoresis 39m/ml Dexamethasone, and Manual therapy  PLAN FOR NEXT SESSION:  when pain decreases, use dynameter to measure strength, Manual therapy & there exer for range, standing balance & functional activities.  vaso to end   SLaureen Abrahams PT, DPT 01/10/23 10:45 AM

## 2023-01-13 ENCOUNTER — Ambulatory Visit (INDEPENDENT_AMBULATORY_CARE_PROVIDER_SITE_OTHER): Payer: Medicare Other | Admitting: Physical Therapy

## 2023-01-13 ENCOUNTER — Encounter: Payer: Self-pay | Admitting: Physical Therapy

## 2023-01-13 DIAGNOSIS — M25662 Stiffness of left knee, not elsewhere classified: Secondary | ICD-10-CM | POA: Diagnosis not present

## 2023-01-13 DIAGNOSIS — R600 Localized edema: Secondary | ICD-10-CM

## 2023-01-13 DIAGNOSIS — M25562 Pain in left knee: Secondary | ICD-10-CM

## 2023-01-13 DIAGNOSIS — R2689 Other abnormalities of gait and mobility: Secondary | ICD-10-CM | POA: Diagnosis not present

## 2023-01-13 DIAGNOSIS — M6281 Muscle weakness (generalized): Secondary | ICD-10-CM

## 2023-01-13 NOTE — Therapy (Signed)
OUTPATIENT PHYSICAL THERAPY TREATMENT NOTE    Patient Name: Joan [name] MRN: [id number] DOB:August 22, 1956, 67 y.o., female Today's Date: 01/13/2023   END OF SESSION:  PT End of Session - 01/13/23 1126     Visit Number 14    Number of Visits 25    Date for PT Re-Evaluation 02/14/23    Authorization Type Medicare & [employer] sup    Progress Note Due on Visit 20    PT Start Time 1127    PT Stop Time 1225    PT Time Calculation (min) 58 min    Equipment Utilized During Treatment Gait belt    Activity Tolerance Patient tolerated treatment well;Patient limited by pain    Behavior During Therapy WFL for tasks assessed/performed                   Past Medical History:  Diagnosis Date   Allergy    Anemia    due to Dysmenorrhea   Arthritis    generalized    Hypertension    initially after BCP initiated    Hypothyroidism    PONV (postoperative nausea and vomiting)    Thyroid disease 2005 & 2009   nodule   Past Surgical History:  Procedure Laterality Date   ABDOMINAL HYSTERECTOMY  1998   for heavy menses and fibroids   BIOPSY THYROID  2005,2009   both negative   CESAREAN SECTION     x2   COLONOSCOPY  2009    Dr [facility]   EXPLORATORY LAPAROTOMY  1985   Ovarian cyst   [place]    LLQ pain due to ovarian cyst   SIGMOIDOSCOPY     TONSILLECTOMY     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   Procedure: LEFT TOTAL KNEE ARTHROPLASTY;  Surgeon: Mcarthur Rossetti, MD;  Location: [facility];  Service: Orthopedics;  Laterality: Left;   Patient Active Problem List   Diagnosis Date Noted   Status post total left knee replacement 11/22/2022   Unilateral primary osteoarthritis, left knee 09/25/2022   Degenerative arthritis of left knee 08/20/2021   Degenerative spondylolisthesis 03/31/2020   Lumbar radiculopathy 03/28/2020   Mid back pain on right side 09/28/2018   Osteopenia 08/19/2018   Hyperglycemia 07/09/2018   Hypothyroidism  06/30/2017   Gallbladder polyp 05/20/2016   Fatty liver 05/20/2016   Obesity 05/01/2016   Vitamin D deficiency 07/14/2013   LOW BACK PAIN SYNDROME 01/23/2010   Status post radioactive iodine thyroid ablation 08/17/2009   Hyperlipidemia 07/26/2008   Essential hypertension 07/26/2008   Pain in joint of left shoulder 07/26/2008   Goiter 07/14/2007    PCP: [name], MD  REFERRING PROVIDER: Jean Rosenthal, MD  REFERRING DIAG: [phone] (ICD-10-CM) - Status post total left knee replacement   THERAPY DIAG:  Acute pain of left knee  Stiffness of left knee, not elsewhere classified  Localized edema  Muscle weakness (generalized)  Other abnormalities of gait and mobility  Rationale for Evaluation and Treatment: Rehabilitation  ONSET DATE: 11/22/2022 TKA sg  SUBJECTIVE:  SUBJECTIVE STATEMENT:   She is still using swing to bend & straightening knee.  She is sleeping 11-3 then awake 30-45 min, then ~2 hours 2x.     PERTINENT HISTORY: OA, lumbar radiculopathy / LBP, osteopenia, obesity, s/p radioactive thyroid ablation, HTN  PAIN:  NPRS scale:  this morning 3/10 with range last week 2-4/10 Pain location: Left knee more medial Pain description: sore Aggravating factors: sitting, increasing distance suddenly  Relieving factors: laying down with knee ext,  ice & Aleve/tylenol   PRECAUTIONS: None  WEIGHT BEARING RESTRICTIONS: Yes WBAT LLE  FALLS:  Has patient fallen in last 6 months? No  LIVING ENVIRONMENT: Lives with: lives with their spouse Lives in: House single level Stairs: Yes: External: 3 steps; on left going up Has following equipment at home: Single point cane, Environmental consultant - 2 wheeled, Wheelchair (manual), and Grab bars  OCCUPATION: retired from Retail buyer at [employer],   [place]: Independent  PATIENT GOALS:   improve knees to enable to enjoy retirement including travel  Next MD visit:  01/02/2022   OBJECTIVE:   DIAGNOSTIC FINDINGS: 11/23/2023 Post LEFT  total knee arthroplasty without acute complication.   PATIENT SURVEYS:  12/30/2022: FOTO 58% 12/06/2022:  FOTO intake: 38%   predicted:  60%  SENSATION: WFL  EDEMA:  Circumferential:  LLE above knee 50.7cm  around knee 47cm  below knee 40cm RLE above knee 46.7cm  around knee 43.6cm  below knee 36 cm   POSTURE: flexed trunk  and weight shift right  PALPATION: Tenderness medial knee (quad, joint line & proximal tibia), lateral joint line & incision.   LOWER EXTREMITY ROM:   ROM P:passive  A:active Left eval Left 12/10/12 Left 12/16/22 Left 12/17/22 Left 12/23/22 Left 12/30/22 Left 01/10/23 Left 01/13/23  Knee flexion Seated A: 83* P: 94* Supine  Supine A: 90 P: 95 Supine A: 94 P: 98 Supine A: 96 P: 100  Supine A: 100* P: 102* Seated P: 110* A: 104* Supine A: 111 Seated P: 118* A: 112*  Knee extension Seated  A: LAQ -13* Supine P: -8* A: -10* Supine A: -8 P: -4 Supine A: -8 P: -4 Supine A: -5 P: -3 Supine A: -3 P: -2 Seated LAQ -6* Standing  A: 0*  Seated A: LAQ -3* Standing  A: 0*   (Blank rows = not tested)  LOWER EXTREMITY MMT:  MMT Left eval Right 01/13/23 Left 01/13/23  Knee flexion 3-/5    Knee extension 3-/5 31.9, 34.1 25.2, 21.8 LLE:RLE 71%   (Blank rows = not tested)   FUNCTIONAL TESTS:  18 inch chair transfer: requires use of BUEs on armrests, weight shift to RLE, limited knee flex  GAIT: Distance walked: 100' Assistive device utilized: Single point cane Level of assistance: SBA Comments: LLE stiff leg with flexion in stance & minimal increase knee flex for swing, antalgic with LLE decreased stance time, LLE abducted, trunk flexed   TODAY'S TREATMENT                                                                          DATE:  01/13/2023: TherEx:  Precor recumbent bike: seat 7 rocking for flexion stretch 2 min, full revolutions forward level 1 for 3 min & level 3 3 min for 8 min total.  Step up, over & step down 6" step  alternating lead LE for 5 reps with single UE support & 5 reps no UE support; PT demo & verbal cues on technique.  Squat at counter with chair behind 15 reps no wt, 10# kettle bell BUEs 10 reps Knee ext machine  BLEs 20# 15 reps; concentric BLEs & isometric / eccentric LLE only 10#  10 reps;  LLE only concentric & eccentric 5# 10 reps Knee flex machine BLEs 35# 15 reps; single LE 15# 10 reps 2 sets ea LE PT educated on weight machines at [facility]. Pt verbalized understanding.   Neuromuscular Re-education: Tandem stance on foam beam 30 sec  Y-reach on foam 5 reps ea LE with supervision & PT cues on stance knee motions. Walking with 10# ea UE 100' working on upright trunk  Manual Therapy: PROM with overpressure for flexion seated  Modalities:  Vasopneumatic: x 10 minutes, medium compression 34 degrees, Left knee  01/10/23 TherEx:  Precor recumbent bike: seat 7 partial to full revolutions x 8 min Seated LAQ 3x10; Lt with 4# AA heel slides on Lt x 10 reps Calf stretch on incline board holding 30 sec 2 reps Heel raises on incline board 20 reps light BUE support LLE on 4" step with Rt heel taps 2x10 Squats 2x10 with min cues for technique at counter  Modalities:  Vasopneumatic: x 10 minutes, medium compression 34 degrees, Left knee  01/06/2023: TherEx:  Precor recumbent bike: seat 7 rocking for flexion stretch 2 min, full revolutions backwards for 2 min then forward level 1 for 4 min Leg press BLEs 118# 15 reps with 5 sec hold flex & ext;   marching for SLS knee stability 81# 10 rep march 2 sets. 62# LLE 15 reps; Hamstring stretch LLE SLR strap 30 sec hold 2 reps Calf stretch on incline board in straddle step:  holding 30 sec 2 reps Heel raises on incline board 15 reps light BUE support Squat at counter with chair behind with PT cues on technique 15 reps   Neuromuscular Re-education: Braiding with counter 8' 3 laps Y-reach 5 reps ea LE with supervision & PT cues on stance knee  motions.  Modalities:  Vasopneumatic: x 10 minutes, medium compression 34 degrees, Left knee    PATIENT EDUCATION:  Education details: HEP, POC Person educated: Patient & husband Education method: Explanation, Demonstration, Verbal cues, and Handouts Education comprehension: verbalized understanding, returned demonstration, and verbal cues required  HOME EXERCISE PROGRAM: Access Code: [facility] URL: [email or web address] Date: 12/26/2022 Prepared by: Jamey Reas  Exercises - Ankle Alphabet in Elevation  - 2-4 x daily - 7 x weekly - 1 sets - 1 reps - Quad Setting and Stretching  - 2-4 x daily - 7 x weekly - 5-10 sets - 10 reps - prop 5-10 minutes & quad set5 seconds hold - Supine Heel Slide with Strap  - 2-3 x daily - 7 x weekly - 2-3 sets - 10 reps - 5 seconds hold - Supine Knee Extension Strengthening  - 2-3 x daily - 7 x weekly - 2-3 sets - 10 reps - 5 seconds hold - Supine Straight Leg Raises  - 2-3 x daily - 7 x weekly - 2-3 sets - 10 reps - 5 seconds hold - Seated Knee Flexion Extension AROM   - 2-4 x daily - 7 x weekly - 2-3 sets - 10 reps - 5 seconds hold - Seated Long Arc Quad  - 2-3 x daily - 7 x weekly - 2-3 sets - 10 reps - 5 seconds hold - Seated straight leg lifts  - 2-3 x daily - 7 x weekly - 2-3 sets - 10 reps - 5 seconds hold - Seated Hamstring Stretch with Strap  - 2-4 x daily - 7 x weekly - 1 sets - 3 reps - 20-30 seconds hold - Standing Eccentric Heel Raise  - 2  x daily - 7 x weekly - 1 sets - 20 reps - 5 seconds hold - Gastroc Stretch on Step  - 2 x daily - 7 x weekly - 1 sets - 3 reps - 20-30 seconds hold - Standing Soleus Stretch on Step with Counter Support  - 2 x daily - 7 x weekly - 1 sets - 3 reps - 20-30 seconds hold - Squat with Counter Support  - 2 x daily - 7 x weekly - 2 sets - 10 reps - 5 seconds hold - Seated Hamstring Curl with Anchored Resistance  - 1 x daily - 7 x weekly - 2-3 sets - 10 reps - 5 seconds hold - Sitting Knee Extension  with Resistance  - 1 x daily - 7 x weekly - 2-3 sets - 10 reps - 5 seconds hold - Standing Terminal Knee Extension with Resistance  - 1 x daily - 7 x weekly - 2-3 sets - 10 reps - 5 seconds hold  ASSESSMENT: CLINICAL IMPRESSION: Patient has improved knee strength with LLE testing at 71% of RLE.  She is improving in knee function with strength & range.  Pt continues to benefit from skilled PT.    OBJECTIVE IMPAIRMENTS: Abnormal gait, decreased activity tolerance, decreased balance, decreased endurance, decreased knowledge of condition, decreased knowledge of use of DME, decreased mobility, difficulty walking, decreased ROM, decreased strength, increased edema, increased muscle spasms, impaired flexibility, obesity, and pain.   ACTIVITY LIMITATIONS: carrying, lifting, bending, sitting, standing, squatting, sleeping, stairs, transfers, and locomotion level  PARTICIPATION LIMITATIONS: meal prep, cleaning, laundry, driving, and community activity  PERSONAL FACTORS: Fitness and 1-2 comorbidities: see PMH  are also affecting patient's functional outcome.   REHAB POTENTIAL: Good  CLINICAL DECISION MAKING: Stable/uncomplicated  EVALUATION COMPLEXITY: Low   GOALS: Goals reviewed with patient? Yes  SHORT TERM GOALS: (target date for Short term goals are 4 weeks 01/03/2023)   1.  Patient will demonstrate independent use of home exercise program to maintain progress from in clinic treatments.  Goal status: MET 12/30/22  LONG TERM GOALS: (target dates for all long term goals are 10 weeks  02/14/2023 )   1. Patient will demonstrate/report pain at worst less than or equal to 2/10 to facilitate minimal limitation in daily activity secondary to pain symptoms.  Goal status: New   2. Patient will demonstrate independent use of home exercise program to facilitate ability to maintain/progress functional gains from skilled physical therapy services.  Goal status: New   3. Patient will demonstrate FOTO  outcome > or = 60 % to indicate reduced disability due to condition.  Goal status: New   4.  Patient will demonstrate LE MMT 5/5 throughout to faciltiate usual transfers, stairs, squatting at [facility] for daily life.   Goal status: New   5.  Patient ambulates >500' & negotiates ramps, curbs & stairs single rail without assistive device independently. Goal status: New     PLAN:  PT FREQUENCY: 2-3x/week  PT DURATION: 10 weeks  PLANNED INTERVENTIONS: Therapeutic exercises, Therapeutic activity, Neuromuscular re-education, Balance training, Gait training, Patient/Family education, Self Care, Joint mobilization, Stair training, DME instructions, Electrical stimulation, Cryotherapy, Moist heat, scar mobilization, Taping, Vasopneumatic device, Ultrasound, Ionotophoresis 7m/ml Dexamethasone, and Manual therapy  PLAN FOR NEXT SESSION:  continue Manual therapy & there exer for range, standing balance & functional activities.  vaso to end   RJamey Reas PT, DPT 01/13/2023, 12:37 PM

## 2023-01-15 ENCOUNTER — Ambulatory Visit (INDEPENDENT_AMBULATORY_CARE_PROVIDER_SITE_OTHER): Payer: Medicare Other | Admitting: Physical Therapy

## 2023-01-15 ENCOUNTER — Encounter: Payer: Self-pay | Admitting: Physical Therapy

## 2023-01-15 DIAGNOSIS — M25562 Pain in left knee: Secondary | ICD-10-CM | POA: Diagnosis not present

## 2023-01-15 DIAGNOSIS — R2689 Other abnormalities of gait and mobility: Secondary | ICD-10-CM

## 2023-01-15 DIAGNOSIS — M25662 Stiffness of left knee, not elsewhere classified: Secondary | ICD-10-CM

## 2023-01-15 DIAGNOSIS — M6281 Muscle weakness (generalized): Secondary | ICD-10-CM

## 2023-01-15 DIAGNOSIS — R600 Localized edema: Secondary | ICD-10-CM | POA: Diagnosis not present

## 2023-01-15 NOTE — Therapy (Signed)
OUTPATIENT PHYSICAL THERAPY TREATMENT NOTE    Patient Name: Joan [name] MRN: [id number] DOB:January 14, 1956, 67 y.o., female Today's Date: 01/15/2023   END OF SESSION:  PT End of Session - 01/15/23 1144     Visit Number 15    Number of Visits 25    Date for PT Re-Evaluation 02/14/23    Authorization Type Medicare & [employer] sup    Progress Note Due on Visit 20    PT Start Time 1145    PT Stop Time 1238    PT Time Calculation (min) 53 min    Equipment Utilized During Treatment Gait belt    Activity Tolerance Patient tolerated treatment well;Patient limited by pain    Behavior During Therapy WFL for tasks assessed/performed                   Past Medical History:  Diagnosis Date   Allergy    Anemia    due to Dysmenorrhea   Arthritis    generalized    Hypertension    initially after BCP initiated    Hypothyroidism    PONV (postoperative nausea and vomiting)    Thyroid disease 2005 & 2009   nodule   Past Surgical History:  Procedure Laterality Date   ABDOMINAL HYSTERECTOMY  1998   for heavy menses and fibroids   BIOPSY THYROID  2005,2009   both negative   CESAREAN SECTION     x2   COLONOSCOPY  2009    Dr [facility]   EXPLORATORY LAPAROTOMY  1985   Ovarian cyst   [place]    LLQ pain due to ovarian cyst   SIGMOIDOSCOPY     TONSILLECTOMY     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   Procedure: LEFT TOTAL KNEE ARTHROPLASTY;  Surgeon: Mcarthur Rossetti, MD;  Location: [facility];  Service: Orthopedics;  Laterality: Left;   Patient Active Problem List   Diagnosis Date Noted   Status post total left knee replacement 11/22/2022   Unilateral primary osteoarthritis, left knee 09/25/2022   Degenerative arthritis of left knee 08/20/2021   Degenerative spondylolisthesis 03/31/2020   Lumbar radiculopathy 03/28/2020   Mid back pain on right side 09/28/2018   Osteopenia 08/19/2018   Hyperglycemia 07/09/2018   Hypothyroidism  06/30/2017   Gallbladder polyp 05/20/2016   Fatty liver 05/20/2016   Obesity 05/01/2016   Vitamin D deficiency 07/14/2013   LOW BACK PAIN SYNDROME 01/23/2010   Status post radioactive iodine thyroid ablation 08/17/2009   Hyperlipidemia 07/26/2008   Essential hypertension 07/26/2008   Pain in joint of left shoulder 07/26/2008   Goiter 07/14/2007    PCP: [name], MD  REFERRING PROVIDER: Jean Rosenthal, MD  REFERRING DIAG: [phone] (ICD-10-CM) - Status post total left knee replacement   THERAPY DIAG:  Acute pain of left knee  Stiffness of left knee, not elsewhere classified  Muscle weakness (generalized)  Localized edema  Other abnormalities of gait and mobility  Rationale for Evaluation and Treatment: Rehabilitation  ONSET DATE: 11/22/2022 TKA sg  SUBJECTIVE:  SUBJECTIVE STATEMENT:  she was sore after last PT session.  She has a head cold.     PERTINENT HISTORY: OA, lumbar radiculopathy / LBP, osteopenia, obesity, s/p radioactive thyroid ablation, HTN  PAIN:  NPRS scale:  this morning 3/10 with range last week 2-4/10 Pain location: Left knee more medial Pain description: sore Aggravating factors: sitting, increasing distance suddenly Relieving factors: laying down with knee ext,  ice & Aleve/tylenol  PRECAUTIONS: None  WEIGHT BEARING RESTRICTIONS: Yes WBAT LLE  FALLS:  Has patient fallen in last 6 months? No  LIVING ENVIRONMENT: Lives with: lives with their spouse Lives in: House single level Stairs: Yes: External: 3 steps; on left going up Has following equipment at home: Single point cane, Environmental consultant - 2 wheeled, Wheelchair (manual), and Grab bars  OCCUPATION: retired from Retail buyer at [employer],   [place]: Independent  PATIENT GOALS:   improve knees to enable to enjoy retirement including travel  Next MD visit:  01/02/2022   OBJECTIVE:   DIAGNOSTIC FINDINGS: 11/23/2023 Post LEFT total knee arthroplasty without acute complication.   PATIENT  SURVEYS:  12/30/2022: FOTO 58% 12/06/2022:  FOTO intake: 38%   predicted:  60%  SENSATION: WFL  EDEMA:  Circumferential:  LLE above knee 50.7cm  around knee 47cm  below knee 40cm RLE above knee 46.7cm  around knee 43.6cm  below knee 36 cm   POSTURE: flexed trunk  and weight shift right  PALPATION: Tenderness medial knee (quad, joint line & proximal tibia), lateral joint line & incision.   LOWER EXTREMITY ROM:   ROM P:passive  A:active Left eval Left 12/10/12 Left 12/16/22 Left 12/17/22 Left 12/23/22 Left 12/30/22 Left 01/10/23 Left 01/13/23  Knee flexion Seated A: 83* P: 94* Supine  Supine A: 90 P: 95 Supine A: 94 P: 98 Supine A: 96 P: 100  Supine A: 100* P: 102* Seated P: 110* A: 104* Supine A: 111 Seated P: 118* A: 112*  Knee extension Seated  A: LAQ -13* Supine P: -8* A: -10* Supine A: -8 P: -4 Supine A: -8 P: -4 Supine A: -5 P: -3 Supine A: -3 P: -2 Seated LAQ -6* Standing  A: 0*  Seated A: LAQ -3* Standing  A: 0*   (Blank rows = not tested)  LOWER EXTREMITY MMT:  MMT Left eval Right 01/13/23 Left 01/13/23  Knee flexion 3-/5    Knee extension 3-/5 31.9, 34.1 25.2, 21.8 LLE:RLE 71%   (Blank rows = not tested)   FUNCTIONAL TESTS:  18 inch chair transfer: requires use of BUEs on armrests, weight shift to RLE, limited knee flex  GAIT: Distance walked: 100' Assistive device utilized: Single point cane Level of assistance: SBA Comments: LLE stiff leg with flexion in stance & minimal increase knee flex for swing, antalgic with LLE decreased stance time, LLE abducted, trunk flexed   TODAY'S TREATMENT                                                                          DATE:  01/15/2023: TherEx:  Precor recumbent bike: seat 7 rocking for flexion stretch 2 min, full revolutions forward level 3 6 min for 8 min total.  Leg press 150# BLEs 15 reps; LLE only 75# 10 reps. Step up, over & step down BOSU round side up LLE for 10 reps with  single UE support & 10 reps no UE support; PT demo & verbal cues on technique.  Squat 15 reps no wt, 12# kettle bell BUEs 10 reps Knee ext machine  BLEs 20# 15 reps; concentric BLEs & isometric / eccentric LLE only 10# 10 reps;  LLE only concentric & eccentric 5# 10 reps Knee flex  machine BLEs 35# 15 reps; single LE 15# 10 reps 2 sets ea LE Pt took notes on weight machines to try at [facility] over weekend.    Neuromuscular Re-education: Slider to 3 cones (ant-lat, lat & post-lat) with single UE light support 5 reps ea LE  Braiding near counter but no touch  Modalities:  Vasopneumatic: x 10 minutes, medium compression 34 degrees, Left knee  01/13/2023: TherEx:  Precor recumbent bike: seat 7 rocking for flexion stretch 2 min, full revolutions forward level 1 for 3 min & level 3 3 min for 8 min total.  Step up, over & step down 6" step alternating lead LE for 5 reps with single UE support & 5 reps no UE support; PT demo & verbal cues on technique.  Squat at counter with chair behind 15 reps no wt, 10# kettle bell BUEs 10 reps Knee ext machine  BLEs 20# 15 reps; concentric BLEs & isometric / eccentric LLE only 10# 10 reps;  LLE only concentric & eccentric 5# 10 reps Knee flex machine BLEs 35# 15 reps; single LE 15# 10 reps 2 sets ea LE PT educated on weight machines at [facility]. Pt verbalized understanding.   Neuromuscular Re-education: Tandem stance on foam beam 30 sec  Y-reach on foam 5 reps ea LE with supervision & PT cues on stance knee motions. Walking with 10# ea UE 100' working on upright trunk  Manual Therapy: PROM with overpressure for flexion seated  Modalities:  Vasopneumatic: x 10 minutes, medium compression 34 degrees, Left knee  01/10/23 TherEx:  Precor recumbent bike: seat 7 partial to full revolutions x 8 min Seated LAQ 3x10; Lt with 4# AA heel slides on Lt x 10 reps Calf stretch on incline board holding 30 sec 2 reps Heel raises on incline board 20 reps light BUE  support LLE on 4" step with Rt heel taps 2x10 Squats 2x10 with min cues for technique at counter  Modalities:  Vasopneumatic: x 10 minutes, medium compression 34 degrees, Left knee   PATIENT EDUCATION:  Education details: HEP, POC Person educated: Patient & husband Education method: Explanation, Media planner, Verbal cues, and Handouts Education comprehension: verbalized understanding, returned demonstration, and verbal cues required  HOME EXERCISE PROGRAM: Access Code: [facility] URL: [email or web address] Date: 12/26/2022 Prepared by: Jamey Reas  Exercises - Ankle Alphabet in Elevation  - 2-4 x daily - 7 x weekly - 1 sets - 1 reps - Quad Setting and Stretching  - 2-4 x daily - 7 x weekly - 5-10 sets - 10 reps - prop 5-10 minutes & quad set5 seconds hold - Supine Heel Slide with Strap  - 2-3 x daily - 7 x weekly - 2-3 sets - 10 reps - 5 seconds hold - Supine Knee Extension Strengthening  - 2-3 x daily - 7 x weekly - 2-3 sets - 10 reps - 5 seconds hold - Supine Straight Leg Raises  - 2-3 x daily - 7 x weekly - 2-3 sets - 10 reps - 5 seconds hold - Seated Knee Flexion Extension AROM   - 2-4 x daily - 7 x weekly - 2-3 sets - 10 reps - 5 seconds hold - Seated Long Arc Quad  - 2-3 x daily - 7 x weekly - 2-3 sets - 10 reps - 5 seconds hold - Seated straight leg lifts  - 2-3 x daily - 7 x weekly - 2-3 sets - 10 reps - 5 seconds hold - Seated Hamstring Stretch with Strap  - 2-4 x  daily - 7 x weekly - 1 sets - 3 reps - 20-30 seconds hold - Standing Eccentric Heel Raise  - 2 x daily - 7 x weekly - 1 sets - 20 reps - 5 seconds hold - Gastroc Stretch on Step  - 2 x daily - 7 x weekly - 1 sets - 3 reps - 20-30 seconds hold - Standing Soleus Stretch on Step with Counter Support  - 2 x daily - 7 x weekly - 1 sets - 3 reps - 20-30 seconds hold - Squat with Counter Support  - 2 x daily - 7 x weekly - 2 sets - 10 reps - 5 seconds hold - Seated Hamstring Curl with Anchored Resistance   - 1 x daily - 7 x weekly - 2-3 sets - 10 reps - 5 seconds hold - Sitting Knee Extension with Resistance  - 1 x daily - 7 x weekly - 2-3 sets - 10 reps - 5 seconds hold - Standing Terminal Knee Extension with Resistance  - 1 x daily - 7 x weekly - 2-3 sets - 10 reps - 5 seconds hold  ASSESSMENT: CLINICAL IMPRESSION: Patient has basic understanding to start using weight machine 1 day / week to ensure understanding for ongoing fitness after PT discharge,  She is improving in knee function with strength & range.  Pt continues to benefit from skilled PT.    OBJECTIVE IMPAIRMENTS: Abnormal gait, decreased activity tolerance, decreased balance, decreased endurance, decreased knowledge of condition, decreased knowledge of use of DME, decreased mobility, difficulty walking, decreased ROM, decreased strength, increased edema, increased muscle spasms, impaired flexibility, obesity, and pain.   ACTIVITY LIMITATIONS: carrying, lifting, bending, sitting, standing, squatting, sleeping, stairs, transfers, and locomotion level  PARTICIPATION LIMITATIONS: meal prep, cleaning, laundry, driving, and community activity  PERSONAL FACTORS: Fitness and 1-2 comorbidities: see PMH  are also affecting patient's functional outcome.   REHAB POTENTIAL: Good  CLINICAL DECISION MAKING: Stable/uncomplicated  EVALUATION COMPLEXITY: Low   GOALS: Goals reviewed with patient? Yes  SHORT TERM GOALS: (target date for Short term goals are 4 weeks 01/03/2023)   1.  Patient will demonstrate independent use of home exercise program to maintain progress from in clinic treatments.  Goal status: MET 12/30/22  LONG TERM GOALS: (target dates for all long term goals are 10 weeks  02/14/2023 )   1. Patient will demonstrate/report pain at worst less than or equal to 2/10 to facilitate minimal limitation in daily activity secondary to pain symptoms.  Goal status: New   2. Patient will demonstrate independent use of home exercise  program to facilitate ability to maintain/progress functional gains from skilled physical therapy services.  Goal status: New   3. Patient will demonstrate FOTO outcome > or = 60 % to indicate reduced disability due to condition.  Goal status: New   4.  Patient will demonstrate LE MMT 5/5 throughout to faciltiate usual transfers, stairs, squatting at [facility] for daily life.   Goal status: New   5.  Patient ambulates >500' & negotiates ramps, curbs & stairs single rail without assistive device independently. Goal status: New     PLAN:  PT FREQUENCY: 2-3x/week  PT DURATION: 10 weeks  PLANNED INTERVENTIONS: Therapeutic exercises, Therapeutic activity, Neuromuscular re-education, Balance training, Gait training, Patient/Family education, Self Care, Joint mobilization, Stair training, DME instructions, Electrical stimulation, Cryotherapy, Moist heat, scar mobilization, Taping, Vasopneumatic device, Ultrasound, Ionotophoresis 97m/ml Dexamethasone, and Manual therapy  PLAN FOR NEXT SESSION:  [facility]  there exer for range, standing balance & functional activities.  vaso to end   Jamey Reas, PT, DPT 01/15/2023, 12:54 PM

## 2023-01-21 ENCOUNTER — Other Ambulatory Visit: Payer: Self-pay

## 2023-01-21 ENCOUNTER — Encounter: Payer: Self-pay | Admitting: Physical Therapy

## 2023-01-21 ENCOUNTER — Other Ambulatory Visit (HOSPITAL_COMMUNITY): Payer: Self-pay

## 2023-01-21 ENCOUNTER — Ambulatory Visit (INDEPENDENT_AMBULATORY_CARE_PROVIDER_SITE_OTHER): Payer: Medicare Other | Admitting: Physical Therapy

## 2023-01-21 DIAGNOSIS — R2689 Other abnormalities of gait and mobility: Secondary | ICD-10-CM

## 2023-01-21 DIAGNOSIS — R600 Localized edema: Secondary | ICD-10-CM | POA: Diagnosis not present

## 2023-01-21 DIAGNOSIS — M25662 Stiffness of left knee, not elsewhere classified: Secondary | ICD-10-CM | POA: Diagnosis not present

## 2023-01-21 DIAGNOSIS — M6281 Muscle weakness (generalized): Secondary | ICD-10-CM | POA: Diagnosis not present

## 2023-01-21 DIAGNOSIS — M25562 Pain in left knee: Secondary | ICD-10-CM

## 2023-01-21 NOTE — Therapy (Signed)
OUTPATIENT PHYSICAL THERAPY TREATMENT NOTE    Patient Name: Joan [name] MRN: [id number] DOB:14-Aug-1956, 67 y.o., female Today's Date: 01/21/2023   END OF SESSION:  PT End of Session - 01/21/23 1436     Visit Number 16    Number of Visits 25    Date for PT Re-Evaluation 02/14/23    Authorization Type Medicare & [employer] sup    Progress Note Due on Visit 20    PT Start Time 1433    PT Stop Time 1525    PT Time Calculation (min) 52 min    Equipment Utilized During Treatment Gait belt    Activity Tolerance Patient tolerated treatment well;Patient limited by pain    Behavior During Therapy WFL for tasks assessed/performed                    Past Medical History:  Diagnosis Date   Allergy    Anemia    due to Dysmenorrhea   Arthritis    generalized    Hypertension    initially after BCP initiated    Hypothyroidism    PONV (postoperative nausea and vomiting)    Thyroid disease 2005 & 2009   nodule   Past Surgical History:  Procedure Laterality Date   ABDOMINAL HYSTERECTOMY  1998   for heavy menses and fibroids   BIOPSY THYROID  2005,2009   both negative   CESAREAN SECTION     x2   COLONOSCOPY  2009    Dr [facility]   EXPLORATORY LAPAROTOMY  1985   Ovarian cyst   [place]    LLQ pain due to ovarian cyst   SIGMOIDOSCOPY     TONSILLECTOMY     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   Procedure: LEFT TOTAL KNEE ARTHROPLASTY;  Surgeon: Mcarthur Rossetti, MD;  Location: [facility];  Service: Orthopedics;  Laterality: Left;   Patient Active Problem List   Diagnosis Date Noted   Status post total left knee replacement 11/22/2022   Unilateral primary osteoarthritis, left knee 09/25/2022   Degenerative arthritis of left knee 08/20/2021   Degenerative spondylolisthesis 03/31/2020   Lumbar radiculopathy 03/28/2020   Mid back pain on right side 09/28/2018   Osteopenia 08/19/2018   Hyperglycemia 07/09/2018   Hypothyroidism  06/30/2017   Gallbladder polyp 05/20/2016   Fatty liver 05/20/2016   Obesity 05/01/2016   Vitamin D deficiency 07/14/2013   LOW BACK PAIN SYNDROME 01/23/2010   Status post radioactive iodine thyroid ablation 08/17/2009   Hyperlipidemia 07/26/2008   Essential hypertension 07/26/2008   Pain in joint of left shoulder 07/26/2008   Goiter 07/14/2007    PCP: [name], MD  REFERRING PROVIDER: Jean Rosenthal, MD  REFERRING DIAG: [phone] (ICD-10-CM) - Status post total left knee replacement   THERAPY DIAG:  Acute pain of left knee  Stiffness of left knee, not elsewhere classified  Muscle weakness (generalized)  Localized edema  Other abnormalities of gait and mobility  Rationale for Evaluation and Treatment: Rehabilitation  ONSET DATE: 11/22/2022 TKA sg  SUBJECTIVE:  SUBJECTIVE STATEMENT:  she went to [facility] & did leg press and knee ext  machine.  She could not find seated flexion.  She walked 16 min in neighborhood.      PERTINENT HISTORY: OA, lumbar radiculopathy / LBP, osteopenia, obesity, s/p radioactive thyroid ablation, HTN  PAIN:  NPRS scale:  today 2/10 with range last week 0-4/10 Pain location: Left knee more medial Pain description: sore Aggravating factors:  sitting, increasing distance suddenly Relieving factors: laying down with knee ext,  ice & Aleve/tylenol   PRECAUTIONS: None  WEIGHT BEARING RESTRICTIONS: Yes WBAT LLE  FALLS:  Has patient fallen in last 6 months? No  LIVING ENVIRONMENT: Lives with: lives with their spouse Lives in: House single level Stairs: Yes: External: 3 steps; on left going up Has following equipment at home: Single point cane, Environmental consultant - 2 wheeled, Wheelchair (manual), and Grab bars  OCCUPATION: retired from Retail buyer at [employer],   Learned: Independent  PATIENT GOALS:   improve knees to enable to enjoy retirement including travel  Next MD visit:  01/02/2022   OBJECTIVE:   DIAGNOSTIC FINDINGS: 11/23/2023 Post LEFT  total knee arthroplasty without acute complication.   PATIENT SURVEYS:  12/30/2022: FOTO 58% 12/06/2022:  FOTO intake: 38%   predicted:  60%  SENSATION: WFL  EDEMA:  Circumferential:  LLE above knee 50.7cm  around knee 47cm  below knee 40cm RLE above knee 46.7cm  around knee 43.6cm  below knee 36 cm   POSTURE: flexed trunk  and weight shift right  PALPATION: Tenderness medial knee (quad, joint line & proximal tibia), lateral joint line & incision.   LOWER EXTREMITY ROM:   ROM P:passive  A:active Left eval Left 12/10/12 Left 12/16/22 Left 12/17/22 Left 12/23/22 Left 12/30/22 Left 01/10/23 Left 01/13/23  Knee flexion Seated A: 83* P: 94* Supine  Supine A: 90 P: 95 Supine A: 94 P: 98 Supine A: 96 P: 100  Supine A: 100* P: 102* Seated P: 110* A: 104* Supine A: 111 Seated P: 118* A: 112*  Knee extension Seated  A: LAQ -13* Supine P: -8* A: -10* Supine A: -8 P: -4 Supine A: -8 P: -4 Supine A: -5 P: -3 Supine A: -3 P: -2 Seated LAQ -6* Standing  A: 0*  Seated A: LAQ -3* Standing  A: 0*   (Blank rows = not tested)  LOWER EXTREMITY MMT:  MMT Left eval Right 01/13/23 Left 01/13/23  Knee flexion 3-/5    Knee extension 3-/5 31.9, 34.1 25.2, 21.8 LLE:RLE 71%   (Blank rows = not tested)   FUNCTIONAL TESTS:  18 inch chair transfer: requires use of BUEs on armrests, weight shift to RLE, limited knee flex  GAIT: Distance walked: 100' Assistive device utilized: Single point cane Level of assistance: SBA Comments: LLE stiff leg with flexion in stance & minimal increase knee flex for swing, antalgic with LLE decreased stance time, LLE abducted, trunk flexed   TODAY'S TREATMENT                                                                          DATE:  01/21/2023: TherEx:   Leg press 150# BLEs 15 reps; marching for SLS knee ext 112# 5 reps 3 sets;  LLE only 75# 10 reps. Knee ext machine  BLEs 20# 15 reps; concentric BLEs & isometric / eccentric LLE  only 10# 10 reps;  LLE only concentric & eccentric 5# 10 reps Knee flex machine BLEs 35# 15 reps; single LE 15# 10 reps 2 sets ea LE PT reviewed including Pt notes on weight machines for [employer] over weekend.  Standing hamstring stretch 30 sec ea LE 2 reps Standing  gastroc stretch 30 sec ea LE 2 reps Standing quad stretch 30 sec ea LE 2 reps   Modalities:  Vasopneumatic: x 10 minutes, medium compression 34 degrees, Left knee  01/15/2023: TherEx:  Precor recumbent bike: seat 7 rocking for flexion stretch 2 min, full revolutions forward level 3 6 min for 8 min total.  Leg press 150# BLEs 15 reps; LLE only 75# 10 reps. Step up, over & step down BOSU round side up LLE for 10 reps with single UE support & 10 reps no UE support; PT demo & verbal cues on technique.  Squat 15 reps no wt, 12# kettle bell BUEs 10 reps Knee ext machine  BLEs 20# 15 reps; concentric BLEs & isometric / eccentric LLE only 10# 10 reps;  LLE only concentric & eccentric 5# 10 reps Knee flex machine BLEs 35# 15 reps; single LE 15# 10 reps 2 sets ea LE Pt took notes on weight machines to try at [facility] over weekend.    Neuromuscular Re-education: Slider to 3 cones (ant-lat, lat & post-lat) with single UE light support 5 reps ea LE  Braiding near counter but no touch  Modalities:  Vasopneumatic: x 10 minutes, medium compression 34 degrees, Left knee  01/13/2023: TherEx:  Precor recumbent bike: seat 7 rocking for flexion stretch 2 min, full revolutions forward level 1 for 3 min & level 3 3 min for 8 min total.  Step up, over & step down 6" step alternating lead LE for 5 reps with single UE support & 5 reps no UE support; PT demo & verbal cues on technique.  Squat at counter with chair behind 15 reps no wt, 10# kettle bell BUEs 10 reps Knee ext machine  BLEs 20# 15 reps; concentric BLEs & isometric / eccentric LLE only 10# 10 reps;  LLE only concentric & eccentric 5# 10 reps Knee flex machine BLEs 35# 15 reps; single LE 15# 10  reps 2 sets ea LE PT educated on weight machines at [facility]. Pt verbalized understanding.   Neuromuscular Re-education: Tandem stance on foam beam 30 sec  Y-reach on foam 5 reps ea LE with supervision & PT cues on stance knee motions. Walking with 10# ea UE 100' working on upright trunk  Manual Therapy: PROM with overpressure for flexion seated  Modalities:  Vasopneumatic: x 10 minutes, medium compression 34 degrees, Left knee   PATIENT EDUCATION:  Education details: HEP, POC Person educated: Patient & husband Education method: Explanation, Demonstration, Verbal cues, and Handouts Education comprehension: verbalized understanding, returned demonstration, and verbal cues required  HOME EXERCISE PROGRAM: Access Code: [facility] URL: [email or web address] Date: 12/26/2022 Prepared by: Jamey Reas  Exercises - Ankle Alphabet in Elevation  - 2-4 x daily - 7 x weekly - 1 sets - 1 reps - Quad Setting and Stretching  - 2-4 x daily - 7 x weekly - 5-10 sets - 10 reps - prop 5-10 minutes & quad set5 seconds hold - Supine Heel Slide with Strap  - 2-3 x daily - 7 x weekly - 2-3 sets - 10 reps - 5 seconds hold - Supine Knee Extension Strengthening  - 2-3 x daily - 7 x weekly - 2-3 sets - 10 reps - 5 seconds hold - Supine Straight Leg Raises  - 2-3 x daily - 7 x weekly - 2-3 sets - 10 reps - 5 seconds hold - Seated Knee Flexion Extension AROM   - 2-4 x daily - 7 x weekly - 2-3 sets - 10 reps -  5 seconds hold - Seated Long Arc Quad  - 2-3 x daily - 7 x weekly - 2-3 sets - 10 reps - 5 seconds hold - Seated straight leg lifts  - 2-3 x daily - 7 x weekly - 2-3 sets - 10 reps - 5 seconds hold - Seated Hamstring Stretch with Strap  - 2-4 x daily - 7 x weekly - 1 sets - 3 reps - 20-30 seconds hold - Standing Eccentric Heel Raise  - 2 x daily - 7 x weekly - 1 sets - 20 reps - 5 seconds hold - Gastroc Stretch on Step  - 2 x daily - 7 x weekly - 1 sets - 3 reps - 20-30 seconds hold -  Standing Soleus Stretch on Step with Counter Support  - 2 x daily - 7 x weekly - 1 sets - 3 reps - 20-30 seconds hold - Squat with Counter Support  - 2 x daily - 7 x weekly - 2 sets - 10 reps - 5 seconds hold - Seated Hamstring Curl with Anchored Resistance  - 1 x daily - 7 x weekly - 2-3 sets - 10 reps - 5 seconds hold - Sitting Knee Extension with Resistance  - 1 x daily - 7 x weekly - 2-3 sets - 10 reps - 5 seconds hold - Standing Terminal Knee Extension with Resistance  - 1 x daily - 7 x weekly - 2-3 sets - 10 reps - 5 seconds hold  ASSESSMENT: CLINICAL IMPRESSION: Patient appears to have a better understanding of weight machines at [facility].  She also seems to understand stretches for LE muscles.  Pt continues to benefit from skilled PT.    OBJECTIVE IMPAIRMENTS: Abnormal gait, decreased activity tolerance, decreased balance, decreased endurance, decreased knowledge of condition, decreased knowledge of use of DME, decreased mobility, difficulty walking, decreased ROM, decreased strength, increased edema, increased muscle spasms, impaired flexibility, obesity, and pain.   ACTIVITY LIMITATIONS: carrying, lifting, bending, sitting, standing, squatting, sleeping, stairs, transfers, and locomotion level  PARTICIPATION LIMITATIONS: meal prep, cleaning, laundry, driving, and community activity  PERSONAL FACTORS: Fitness and 1-2 comorbidities: see PMH  are also affecting patient's functional outcome.   REHAB POTENTIAL: Good  CLINICAL DECISION MAKING: Stable/uncomplicated  EVALUATION COMPLEXITY: Low   GOALS: Goals reviewed with patient? Yes  SHORT TERM GOALS: (target date for Short term goals are 4 weeks 01/03/2023)   1.  Patient will demonstrate independent use of home exercise program to maintain progress from in clinic treatments.  Goal status: MET 12/30/22  LONG TERM GOALS: (target dates for all long term goals are 10 weeks  02/14/2023 )   1. Patient will demonstrate/report pain at worst  less than or equal to 2/10 to facilitate minimal limitation in daily activity secondary to pain symptoms.  Goal status: New   2. Patient will demonstrate independent use of home exercise program to facilitate ability to maintain/progress functional gains from skilled physical therapy services.  Goal status: New   3. Patient will demonstrate FOTO outcome > or = 60 % to indicate reduced disability due to condition.  Goal status: New   4.  Patient will demonstrate LE MMT 5/5 throughout to faciltiate usual transfers, stairs, squatting at [facility] for daily life.   Goal status: New   5.  Patient ambulates >500' & negotiates ramps, curbs & stairs single rail without assistive device independently. Goal status: New     PLAN:  PT FREQUENCY: 2-3x/week  PT DURATION: 10 weeks  PLANNED INTERVENTIONS:  Therapeutic exercises, Therapeutic activity, Neuromuscular re-education, Balance training, Gait training, Patient/Family education, Self Care, Joint mobilization, Stair training, DME instructions, Electrical stimulation, Cryotherapy, Moist heat, scar mobilization, Taping, Vasopneumatic device, Ultrasound, Ionotophoresis '4mg'$ /ml Dexamethasone, and Manual therapy  PLAN FOR NEXT SESSION: check range,  standing balance & functional activities.  vaso to end   Jamey Reas, PT, DPT 01/21/2023, 3:48 PM

## 2023-01-23 ENCOUNTER — Encounter: Payer: Self-pay | Admitting: Physical Therapy

## 2023-01-23 ENCOUNTER — Ambulatory Visit (INDEPENDENT_AMBULATORY_CARE_PROVIDER_SITE_OTHER): Payer: Medicare Other | Admitting: Physical Therapy

## 2023-01-23 DIAGNOSIS — M6281 Muscle weakness (generalized): Secondary | ICD-10-CM | POA: Diagnosis not present

## 2023-01-23 DIAGNOSIS — R600 Localized edema: Secondary | ICD-10-CM | POA: Diagnosis not present

## 2023-01-23 DIAGNOSIS — M25662 Stiffness of left knee, not elsewhere classified: Secondary | ICD-10-CM

## 2023-01-23 DIAGNOSIS — M25562 Pain in left knee: Secondary | ICD-10-CM | POA: Diagnosis not present

## 2023-01-23 DIAGNOSIS — R2689 Other abnormalities of gait and mobility: Secondary | ICD-10-CM

## 2023-01-23 NOTE — Therapy (Deleted)
OUTPATIENT PHYSICAL THERAPY TREATMENT NOTE    Patient Name: Joan [name] MRN: [id number] DOB:[birth year], 67 y.o., female Today's Date: 01/21/2023   END OF SESSION:  PT End of Session - 01/21/23 1436     Visit Number 16    Number of Visits 25    Date for PT Re-Evaluation 02/14/23    Authorization Type Medicare & [employer] sup    Progress Note Due on Visit 20    PT Start Time 1433    PT Stop Time 1525    PT Time Calculation (min) 52 min    Equipment Utilized During Treatment Gait belt    Activity Tolerance Patient tolerated treatment well;Patient limited by pain    Behavior During Therapy WFL for tasks assessed/performed                    Past Medical History:  Diagnosis Date   Allergy    Anemia    due to Dysmenorrhea   Arthritis    generalized    Hypertension    initially after BCP initiated    Hypothyroidism    PONV (postoperative nausea and vomiting)    Thyroid disease 2005 & 2009   nodule   Past Surgical History:  Procedure Laterality Date   ABDOMINAL HYSTERECTOMY  1998   for heavy menses and fibroids   BIOPSY THYROID  2005,2009   both negative   CESAREAN SECTION     x2   COLONOSCOPY  2009    Dr [facility]   EXPLORATORY LAPAROTOMY  1985   Ovarian cyst   [place]    LLQ pain due to ovarian cyst   SIGMOIDOSCOPY     TONSILLECTOMY     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   Procedure: LEFT TOTAL KNEE ARTHROPLASTY;  Surgeon: Mcarthur Rossetti, MD;  Location: [facility];  Service: Orthopedics;  Laterality: Left;   Patient Active Problem List   Diagnosis Date Noted   Status post total left knee replacement 11/22/2022   Unilateral primary osteoarthritis, left knee 09/25/2022   Degenerative arthritis of left knee 08/20/2021   Degenerative spondylolisthesis 03/31/2020   Lumbar radiculopathy 03/28/2020   Mid back pain on right side 09/28/2018   Osteopenia 08/19/2018   Hyperglycemia 07/09/2018   Hypothyroidism  06/30/2017   Gallbladder polyp 05/20/2016   Fatty liver 05/20/2016   Obesity 05/01/2016   Vitamin D deficiency 07/14/2013   LOW BACK PAIN SYNDROME 01/23/2010   Status post radioactive iodine thyroid ablation 08/17/2009   Hyperlipidemia 07/26/2008   Essential hypertension 07/26/2008   Pain in joint of left shoulder 07/26/2008   Goiter 07/14/2007    PCP: [name], MD  REFERRING PROVIDER: Jean Rosenthal, MD  REFERRING DIAG: [phone] (ICD-10-CM) - Status post total left knee replacement   THERAPY DIAG:  Acute pain of left knee  Stiffness of left knee, not elsewhere classified  Muscle weakness (generalized)  Localized edema  Other abnormalities of gait and mobility  Rationale for Evaluation and Treatment: Rehabilitation  ONSET DATE: 11/22/2022 TKA sg  SUBJECTIVE:  SUBJECTIVE STATEMENT:  she went to [facility] & did leg press and knee ext  machine.  She could not find seated flexion.  She walked 16 min in neighborhood.      PERTINENT HISTORY: OA, lumbar radiculopathy / LBP, osteopenia, obesity, s/p radioactive thyroid ablation, HTN  PAIN:  NPRS scale:  today 2/10 with range last week 0-4/10 Pain location: Left knee more medial Pain description: sore Aggravating factors:  sitting, increasing distance suddenly Relieving factors: laying down with knee ext,  ice & Aleve/tylenol   PRECAUTIONS: None  WEIGHT BEARING RESTRICTIONS: Yes WBAT LLE  FALLS:  Has patient fallen in last 6 months? No  LIVING ENVIRONMENT: Lives with: lives with their spouse Lives in: House single level Stairs: Yes: External: 3 steps; on left going up Has following equipment at home: Single point cane, Environmental consultant - 2 wheeled, Wheelchair (manual), and Grab bars  OCCUPATION: retired from Retail buyer at [employer],   [place]: Independent  PATIENT GOALS:   improve knees to enable to enjoy retirement including travel  Next MD visit:  01/02/2022   OBJECTIVE:   DIAGNOSTIC FINDINGS: 11/23/2023 Post LEFT  total knee arthroplasty without acute complication.   PATIENT SURVEYS:  12/30/2022: FOTO 58% 12/06/2022:  FOTO intake: 38%   predicted:  60%  SENSATION: WFL  EDEMA:  Circumferential:  LLE above knee 50.7cm  around knee 47cm  below knee 40cm RLE above knee 46.7cm  around knee 43.6cm  below knee 36 cm   POSTURE: flexed trunk  and weight shift right  PALPATION: Tenderness medial knee (quad, joint line & proximal tibia), lateral joint line & incision.   LOWER EXTREMITY ROM:   ROM P:passive  A:active Left eval Left 12/10/12 Left 12/16/22 Left 12/17/22 Left 12/23/22 Left 12/30/22 Left 01/10/23 Left 01/13/23  Knee flexion Seated A: 83* P: 94* Supine  Supine A: 90 P: 95 Supine A: 94 P: 98 Supine A: 96 P: 100  Supine A: 100* P: 102* Seated P: 110* A: 104* Supine A: 111 Seated P: 118* A: 112*  Knee extension Seated  A: LAQ -13* Supine P: -8* A: -10* Supine A: -8 P: -4 Supine A: -8 P: -4 Supine A: -5 P: -3 Supine A: -3 P: -2 Seated LAQ -6* Standing  A: 0*  Seated A: LAQ -3* Standing  A: 0*   (Blank rows = not tested)  LOWER EXTREMITY MMT:  MMT Left eval Right 01/13/23 Left 01/13/23  Knee flexion 3-/5    Knee extension 3-/5 31.9, 34.1 25.2, 21.8 LLE:RLE 71%   (Blank rows = not tested)   FUNCTIONAL TESTS:  18 inch chair transfer: requires use of BUEs on armrests, weight shift to RLE, limited knee flex  GAIT: Distance walked: 100' Assistive device utilized: Single point cane Level of assistance: SBA Comments: LLE stiff leg with flexion in stance & minimal increase knee flex for swing, antalgic with LLE decreased stance time, LLE abducted, trunk flexed   TODAY'S TREATMENT                                                                          DATE:  01/21/2023: TherEx:   Leg press 150# BLEs 15 reps; marching for SLS knee ext 112# 5 reps 3 sets;  LLE only 75# 10 reps. Knee ext machine  BLEs 20# 15 reps; concentric BLEs & isometric / eccentric LLE  only 10# 10 reps;  LLE only concentric & eccentric 5# 10 reps Knee flex machine BLEs 35# 15 reps; single LE 15# 10 reps 2 sets ea LE PT reviewed including Pt notes on weight machines for [employer] over weekend.  Standing hamstring stretch 30 sec ea LE 2 reps Standing  gastroc stretch 30 sec ea LE 2 reps Standing quad stretch 30 sec ea LE 2 reps   Modalities:  Vasopneumatic: x 10 minutes, medium compression 34 degrees, Left knee  01/15/2023: TherEx:  Precor recumbent bike: seat 7 rocking for flexion stretch 2 min, full revolutions forward level 3 6 min for 8 min total.  Leg press 150# BLEs 15 reps; LLE only 75# 10 reps. Step up, over & step down BOSU round side up LLE for 10 reps with single UE support & 10 reps no UE support; PT demo & verbal cues on technique.  Squat 15 reps no wt, 12# kettle bell BUEs 10 reps Knee ext machine  BLEs 20# 15 reps; concentric BLEs & isometric / eccentric LLE only 10# 10 reps;  LLE only concentric & eccentric 5# 10 reps Knee flex machine BLEs 35# 15 reps; single LE 15# 10 reps 2 sets ea LE Pt took notes on weight machines to try at [facility] over weekend.    Neuromuscular Re-education: Slider to 3 cones (ant-lat, lat & post-lat) with single UE light support 5 reps ea LE  Braiding near counter but no touch  Modalities:  Vasopneumatic: x 10 minutes, medium compression 34 degrees, Left knee  01/13/2023: TherEx:  Precor recumbent bike: seat 7 rocking for flexion stretch 2 min, full revolutions forward level 1 for 3 min & level 3 3 min for 8 min total.  Step up, over & step down 6" step alternating lead LE for 5 reps with single UE support & 5 reps no UE support; PT demo & verbal cues on technique.  Squat at counter with chair behind 15 reps no wt, 10# kettle bell BUEs 10 reps Knee ext machine  BLEs 20# 15 reps; concentric BLEs & isometric / eccentric LLE only 10# 10 reps;  LLE only concentric & eccentric 5# 10 reps Knee flex machine BLEs 35# 15 reps; single LE 15# 10  reps 2 sets ea LE PT educated on weight machines at [facility]. Pt verbalized understanding.   Neuromuscular Re-education: Tandem stance on foam beam 30 sec  Y-reach on foam 5 reps ea LE with supervision & PT cues on stance knee motions. Walking with 10# ea UE 100' working on upright trunk  Manual Therapy: PROM with overpressure for flexion seated  Modalities:  Vasopneumatic: x 10 minutes, medium compression 34 degrees, Left knee   PATIENT EDUCATION:  Education details: HEP, POC Person educated: Patient & husband Education method: Explanation, Demonstration, Verbal cues, and Handouts Education comprehension: verbalized understanding, returned demonstration, and verbal cues required  HOME EXERCISE PROGRAM: Access Code: [facility] URL: [email or web address] Date: 12/26/2022 Prepared by: Jamey Reas  Exercises - Ankle Alphabet in Elevation  - 2-4 x daily - 7 x weekly - 1 sets - 1 reps - Quad Setting and Stretching  - 2-4 x daily - 7 x weekly - 5-10 sets - 10 reps - prop 5-10 minutes & quad set5 seconds hold - Supine Heel Slide with Strap  - 2-3 x daily - 7 x weekly - 2-3 sets - 10 reps - 5 seconds hold - Supine Knee Extension Strengthening  - 2-3 x daily - 7 x weekly - 2-3 sets - 10 reps - 5 seconds hold - Supine Straight Leg Raises  - 2-3 x daily - 7 x weekly - 2-3 sets - 10 reps - 5 seconds hold - Seated Knee Flexion Extension AROM   - 2-4 x daily - 7 x weekly - 2-3 sets - 10 reps -  5 seconds hold - Seated Long Arc Quad  - 2-3 x daily - 7 x weekly - 2-3 sets - 10 reps - 5 seconds hold - Seated straight leg lifts  - 2-3 x daily - 7 x weekly - 2-3 sets - 10 reps - 5 seconds hold - Seated Hamstring Stretch with Strap  - 2-4 x daily - 7 x weekly - 1 sets - 3 reps - 20-30 seconds hold - Standing Eccentric Heel Raise  - 2 x daily - 7 x weekly - 1 sets - 20 reps - 5 seconds hold - Gastroc Stretch on Step  - 2 x daily - 7 x weekly - 1 sets - 3 reps - 20-30 seconds hold -  Standing Soleus Stretch on Step with Counter Support  - 2 x daily - 7 x weekly - 1 sets - 3 reps - 20-30 seconds hold - Squat with Counter Support  - 2 x daily - 7 x weekly - 2 sets - 10 reps - 5 seconds hold - Seated Hamstring Curl with Anchored Resistance  - 1 x daily - 7 x weekly - 2-3 sets - 10 reps - 5 seconds hold - Sitting Knee Extension with Resistance  - 1 x daily - 7 x weekly - 2-3 sets - 10 reps - 5 seconds hold - Standing Terminal Knee Extension with Resistance  - 1 x daily - 7 x weekly - 2-3 sets - 10 reps - 5 seconds hold  ASSESSMENT: CLINICAL IMPRESSION: Patient appears to have a better understanding of weight machines at [facility].  She also seems to understand stretches for LE muscles.  Pt continues to benefit from skilled PT.    OBJECTIVE IMPAIRMENTS: Abnormal gait, decreased activity tolerance, decreased balance, decreased endurance, decreased knowledge of condition, decreased knowledge of use of DME, decreased mobility, difficulty walking, decreased ROM, decreased strength, increased edema, increased muscle spasms, impaired flexibility, obesity, and pain.   ACTIVITY LIMITATIONS: carrying, lifting, bending, sitting, standing, squatting, sleeping, stairs, transfers, and locomotion level  PARTICIPATION LIMITATIONS: meal prep, cleaning, laundry, driving, and community activity  PERSONAL FACTORS: Fitness and 1-2 comorbidities: see PMH  are also affecting patient's functional outcome.   REHAB POTENTIAL: Good  CLINICAL DECISION MAKING: Stable/uncomplicated  EVALUATION COMPLEXITY: Low   GOALS: Goals reviewed with patient? Yes  SHORT TERM GOALS: (target date for Short term goals are 4 weeks 01/03/2023)   1.  Patient will demonstrate independent use of home exercise program to maintain progress from in clinic treatments.  Goal status: MET 12/30/22  LONG TERM GOALS: (target dates for all long term goals are 10 weeks  02/14/2023 )   1. Patient will demonstrate/report pain at worst  less than or equal to 2/10 to facilitate minimal limitation in daily activity secondary to pain symptoms.  Goal status: New   2. Patient will demonstrate independent use of home exercise program to facilitate ability to maintain/progress functional gains from skilled physical therapy services.  Goal status: New   3. Patient will demonstrate FOTO outcome > or = 60 % to indicate reduced disability due to condition.  Goal status: New   4.  Patient will demonstrate LE MMT 5/5 throughout to faciltiate usual transfers, stairs, squatting at [facility] for daily life.   Goal status: New   5.  Patient ambulates >500' & negotiates ramps, curbs & stairs single rail without assistive device independently. Goal status: New     PLAN:  PT FREQUENCY: 2-3x/week  PT DURATION: 10 weeks  PLANNED INTERVENTIONS:  Therapeutic exercises, Therapeutic activity, Neuromuscular re-education, Balance training, Gait training, Patient/Family education, Self Care, Joint mobilization, Stair training, DME instructions, Electrical stimulation, Cryotherapy, Moist heat, scar mobilization, Taping, Vasopneumatic device, Ultrasound, Ionotophoresis '4mg'$ /ml Dexamethasone, and Manual therapy  PLAN FOR NEXT SESSION: check range,  standing balance & functional activities.  vaso to end   Jamey Reas, PT, DPT 01/21/2023, 3:48 PM

## 2023-01-23 NOTE — Therapy (Signed)
OUTPATIENT PHYSICAL THERAPY TREATMENT NOTE    Patient Name: Joan [name] MRN: [id number] DOB:11-07-1956, 67 y.o., female Today's Date: 01/23/2023   END OF SESSION:  PT End of Session - 01/23/23 1258     Visit Number 17    Number of Visits 25    Date for PT Re-Evaluation 02/14/23    Authorization Type Medicare & [employer] sup    Progress Note Due on Visit 20    PT Start Time 1258    PT Stop Time 1353    PT Time Calculation (min) 55 min    Equipment Utilized During Treatment Gait belt    Activity Tolerance Patient tolerated treatment well;Patient limited by pain    Behavior During Therapy WFL for tasks assessed/performed                     Past Medical History:  Diagnosis Date   Allergy    Anemia    due to Dysmenorrhea   Arthritis    generalized    Hypertension    initially after BCP initiated    Hypothyroidism    PONV (postoperative nausea and vomiting)    Thyroid disease 2005 & 2009   nodule   Past Surgical History:  Procedure Laterality Date   ABDOMINAL HYSTERECTOMY  1998   for heavy menses and fibroids   BIOPSY THYROID  2005,2009   both negative   CESAREAN SECTION     x2   COLONOSCOPY  2009    Dr [facility]   EXPLORATORY LAPAROTOMY  1985   Ovarian cyst   Chloride    LLQ pain due to ovarian cyst   SIGMOIDOSCOPY     TONSILLECTOMY     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   Procedure: LEFT TOTAL KNEE ARTHROPLASTY;  Surgeon: Mcarthur Rossetti, MD;  Location: [facility];  Service: Orthopedics;  Laterality: Left;   Patient Active Problem List   Diagnosis Date Noted   Status post total left knee replacement 11/22/2022   Unilateral primary osteoarthritis, left knee 09/25/2022   Degenerative arthritis of left knee 08/20/2021   Degenerative spondylolisthesis 03/31/2020   Lumbar radiculopathy 03/28/2020   Mid back pain on right side 09/28/2018   Osteopenia 08/19/2018   Hyperglycemia 07/09/2018    Hypothyroidism 06/30/2017   Gallbladder polyp 05/20/2016   Fatty liver 05/20/2016   Obesity 05/01/2016   Vitamin D deficiency 07/14/2013   LOW BACK PAIN SYNDROME 01/23/2010   Status post radioactive iodine thyroid ablation 08/17/2009   Hyperlipidemia 07/26/2008   Essential hypertension 07/26/2008   Pain in joint of left shoulder 07/26/2008   Goiter 07/14/2007    PCP: [name], MD  REFERRING PROVIDER: Jean Rosenthal, MD  REFERRING DIAG: [phone] (ICD-10-CM) - Status post total left knee replacement   THERAPY DIAG:  Acute pain of left knee  Stiffness of left knee, not elsewhere classified  Muscle weakness (generalized)  Localized edema  Other abnormalities of gait and mobility  Rationale for Evaluation and Treatment: Rehabilitation  ONSET DATE: 11/22/2022 TKA sg  SUBJECTIVE:  SUBJECTIVE STATEMENT:  she went to [facility] yesterday and did knee ext, flex & leg press machines and rode bike.       PERTINENT HISTORY: OA, lumbar radiculopathy / LBP, osteopenia, obesity, s/p radioactive thyroid ablation, HTN  PAIN:  NPRS scale:  today 1/10 with range last week 0-3/10 Pain location: Left knee more medial Pain description: sore Aggravating factors: sitting, increasing distance suddenly Relieving factors: laying down  with knee ext,  ice & Aleve/tylenol   PRECAUTIONS: None  WEIGHT BEARING RESTRICTIONS: Yes WBAT LLE  FALLS:  Has patient fallen in last 6 months? No  LIVING ENVIRONMENT: Lives with: lives with their spouse Lives in: House single level Stairs: Yes: External: 3 steps; on left going up Has following equipment at home: Single point cane, Environmental consultant - 2 wheeled, Wheelchair (manual), and Grab bars  OCCUPATION: retired from Retail buyer at [employer],   [place]: Independent  PATIENT GOALS:   improve knees to enable to enjoy retirement including travel  Next MD visit:  01/02/2022   OBJECTIVE:   DIAGNOSTIC FINDINGS: 11/23/2023 Post LEFT total knee arthroplasty  without acute complication.   PATIENT SURVEYS:  12/30/2022: FOTO 58% 12/06/2022:  FOTO intake: 38%   predicted:  60%  SENSATION: WFL  EDEMA:  Circumferential:  LLE above knee 50.7cm  around knee 47cm  below knee 40cm RLE above knee 46.7cm  around knee 43.6cm  below knee 36 cm   POSTURE: flexed trunk  and weight shift right  PALPATION: Tenderness medial knee (quad, joint line & proximal tibia), lateral joint line & incision.   LOWER EXTREMITY ROM:   ROM P:passive  A:active Left eval Left 12/10/12 Left 12/16/22 Left 12/17/22 Left 12/23/22 Left 12/30/22 Left 01/10/23 Left 01/13/23 Left 01/23/23  Knee flexion Seated A: 83* P: 94* Supine  Supine A: 90 P: 95 Supine A: 94 P: 98 Supine A: 96 P: 100  Supine A: 100* P: 102* Seated P: 110* A: 104* Supine A: 111 Seated P: 118* A: 112* Seated P: 120* A: 113*  Knee extension Seated  A: LAQ -13* Supine P: -8* A: -10* Supine A: -8 P: -4 Supine A: -8 P: -4 Supine A: -5 P: -3 Supine A: -3 P: -2 Seated LAQ -6* Standing  A: 0*  Seated A: LAQ -3* Standing  A: 0* Seated A: LAQ 0*   (Blank rows = not tested)  LOWER EXTREMITY MMT:  MMT Left eval Right 01/13/23 Left 01/13/23  Knee flexion 3-/5    Knee extension 3-/5 31.9, 34.1 25.2, 21.8 LLE:RLE 71%   (Blank rows = not tested)   FUNCTIONAL TESTS:  18 inch chair transfer: requires use of BUEs on armrests, weight shift to RLE, limited knee flex  GAIT: Distance walked: 100' Assistive device utilized: Single point cane Level of assistance: SBA Comments: LLE stiff leg with flexion in stance & minimal increase knee flex for swing, antalgic with LLE decreased stance time, LLE abducted, trunk flexed   TODAY'S TREATMENT                                                                          DATE:  01/23/2023: TherEx:   Knee ext machine  BLEs 20# 15 reps; concentric BLEs & isometric / eccentric LLE only 10# 10 reps;  LLE only concentric & eccentric 5# 10 reps Knee  flex machine BLEs 35# 15 reps; single LE 15# 10 reps 2 sets ea LE Squat at sink 15 reps PT reviewed 4 components of well rounded fitness program and ongoing exercise plans to maximize TKA recovery. Pt verbalized understanding.      Therapeutic Activities Gait carrying 15# kettle bell in ea UE 100' 2  reps ea UE. Stairs 11 steps 7.5" ht alternating pattern descending 8 steps right rail & 3 steps no rail;  ascending no rail  Kneeling on floor with knees on foam pad with UEs pushing on mat table 60 secs Quadriped on bed 60 sec  Neuromuscular Re-ed: Slider to 3 cones (ant-lat, lat & post-lat) with stance flexion on outward motion & ext on inward motion for 5 reps ea LE.  Modalities:  Vasopneumatic: x 10 minutes, medium compression 34 degrees, Left knee  01/21/2023: TherEx:   Leg press 150# BLEs 15 reps; marching for SLS knee ext 112# 5 reps 3 sets;  LLE only 75# 10 reps. Knee ext machine  BLEs 20# 15 reps; concentric BLEs & isometric / eccentric LLE only 10# 10 reps;  LLE only concentric & eccentric 5# 10 reps Knee flex machine BLEs 35# 15 reps; single LE 15# 10 reps 2 sets ea LE PT reviewed including Pt notes on weight machines for [employer] over weekend.  Standing hamstring stretch 30 sec ea LE 2 reps Standing gastroc stretch 30 sec ea LE 2 reps Standing quad stretch 30 sec ea LE 2 reps   Modalities:  Vasopneumatic: x 10 minutes, medium compression 34 degrees, Left knee  01/15/2023: TherEx:  Precor recumbent bike: seat 7 rocking for flexion stretch 2 min, full revolutions forward level 3 6 min for 8 min total.  Leg press 150# BLEs 15 reps; LLE only 75# 10 reps. Step up, over & step down BOSU round side up LLE for 10 reps with single UE support & 10 reps no UE support; PT demo & verbal cues on technique.  Squat 15 reps no wt, 12# kettle bell BUEs 10 reps Knee ext machine  BLEs 20# 15 reps; concentric BLEs & isometric / eccentric LLE only 10# 10 reps;  LLE only concentric & eccentric 5# 10  reps Knee flex machine BLEs 35# 15 reps; single LE 15# 10 reps 2 sets ea LE Pt took notes on weight machines to try at [facility] over weekend.    Neuromuscular Re-education: Slider to 3 cones (ant-lat, lat & post-lat) with single UE light support 5 reps ea LE  Braiding near counter but no touch  Modalities:  Vasopneumatic: x 10 minutes, medium compression 34 degrees, Left knee    PATIENT EDUCATION:  Education details: HEP, POC Person educated: Patient & husband Education method: Explanation, Demonstration, Verbal cues, and Handouts Education comprehension: verbalized understanding, returned demonstration, and verbal cues required  HOME EXERCISE PROGRAM: Access Code: [facility] URL: [email or web address] Date: 12/26/2022 Prepared by: Jamey Reas  Exercises - Ankle Alphabet in Elevation  - 2-4 x daily - 7 x weekly - 1 sets - 1 reps - Quad Setting and Stretching  - 2-4 x daily - 7 x weekly - 5-10 sets - 10 reps - prop 5-10 minutes & quad set5 seconds hold - Supine Heel Slide with Strap  - 2-3 x daily - 7 x weekly - 2-3 sets - 10 reps - 5 seconds hold - Supine Knee Extension Strengthening  - 2-3 x daily - 7 x weekly - 2-3 sets - 10 reps - 5 seconds hold - Supine Straight Leg Raises  - 2-3 x daily - 7 x weekly - 2-3 sets - 10 reps - 5 seconds hold - Seated Knee Flexion Extension AROM   - 2-4 x daily - 7 x weekly - 2-3 sets - 10 reps - 5 seconds hold - Seated Long Arc Quad  - 2-3 x daily -  7 x weekly - 2-3 sets - 10 reps - 5 seconds hold - Seated straight leg lifts  - 2-3 x daily - 7 x weekly - 2-3 sets - 10 reps - 5 seconds hold - Seated Hamstring Stretch with Strap  - 2-4 x daily - 7 x weekly - 1 sets - 3 reps - 20-30 seconds hold - Standing Eccentric Heel Raise  - 2 x daily - 7 x weekly - 1 sets - 20 reps - 5 seconds hold - Gastroc Stretch on Step  - 2 x daily - 7 x weekly - 1 sets - 3 reps - 20-30 seconds hold - Standing Soleus Stretch on Step with Counter Support  - 2 x  daily - 7 x weekly - 1 sets - 3 reps - 20-30 seconds hold - Squat with Counter Support  - 2 x daily - 7 x weekly - 2 sets - 10 reps - 5 seconds hold - Seated Hamstring Curl with Anchored Resistance  - 1 x daily - 7 x weekly - 2-3 sets - 10 reps - 5 seconds hold - Sitting Knee Extension with Resistance  - 1 x daily - 7 x weekly - 2-3 sets - 10 reps - 5 seconds hold - Standing Terminal Knee Extension with Resistance  - 1 x daily - 7 x weekly - 2-3 sets - 10 reps - 5 seconds hold  ASSESSMENT: CLINICAL IMPRESSION: Patient appears close to discharge.  She needs 1-2 more visits to finalize ongoing HEP including [employer].    OBJECTIVE IMPAIRMENTS: Abnormal gait, decreased activity tolerance, decreased balance, decreased endurance, decreased knowledge of condition, decreased knowledge of use of DME, decreased mobility, difficulty walking, decreased ROM, decreased strength, increased edema, increased muscle spasms, impaired flexibility, obesity, and pain.   ACTIVITY LIMITATIONS: carrying, lifting, bending, sitting, standing, squatting, sleeping, stairs, transfers, and locomotion level  PARTICIPATION LIMITATIONS: meal prep, cleaning, laundry, driving, and community activity  PERSONAL FACTORS: Fitness and 1-2 comorbidities: see PMH  are also affecting patient's functional outcome.   REHAB POTENTIAL: Good  CLINICAL DECISION MAKING: Stable/uncomplicated  EVALUATION COMPLEXITY: Low   GOALS: Goals reviewed with patient? Yes  SHORT TERM GOALS: (target date for Short term goals are 4 weeks 01/03/2023)   1.  Patient will demonstrate independent use of home exercise program to maintain progress from in clinic treatments.  Goal status: MET 12/30/22  LONG TERM GOALS: (target dates for all long term goals are 10 weeks  02/14/2023 )   1. Patient will demonstrate/report pain at worst less than or equal to 2/10 to facilitate minimal limitation in daily activity secondary to pain symptoms.  Goal status: New   2.  Patient will demonstrate independent use of home exercise program to facilitate ability to maintain/progress functional gains from skilled physical therapy services.  Goal status: New   3. Patient will demonstrate FOTO outcome > or = 60 % to indicate reduced disability due to condition.  Goal status: New   4.  Patient will demonstrate LE MMT 5/5 throughout to faciltiate usual transfers, stairs, squatting at [facility] for daily life.   Goal status: New   5.  Patient ambulates >500' & negotiates ramps, curbs & stairs single rail without assistive device independently. Goal status: New     PLAN:  PT FREQUENCY: 2-3x/week  PT DURATION: 10 weeks  PLANNED INTERVENTIONS: Therapeutic exercises, Therapeutic activity, Neuromuscular re-education, Balance training, Gait training, Patient/Family education, Self Care, Joint mobilization, Stair training, DME instructions, Electrical stimulation, Cryotherapy, Moist heat, scar mobilization,  Taping, Vasopneumatic device, Ultrasound, Ionotophoresis '4mg'$ /ml Dexamethasone, and Manual therapy  PLAN FOR NEXT SESSION: begin to check LTGs with plan to discharge on 3/6 PT visit,  standing balance & functional activities.  vaso to end   Jamey Reas, PT, DPT 01/23/2023, 1:59 PM

## 2023-01-27 ENCOUNTER — Encounter: Payer: Self-pay | Admitting: Physical Therapy

## 2023-01-27 ENCOUNTER — Ambulatory Visit (INDEPENDENT_AMBULATORY_CARE_PROVIDER_SITE_OTHER): Payer: Medicare Other | Admitting: Physical Therapy

## 2023-01-27 DIAGNOSIS — M25662 Stiffness of left knee, not elsewhere classified: Secondary | ICD-10-CM | POA: Diagnosis not present

## 2023-01-27 DIAGNOSIS — R2689 Other abnormalities of gait and mobility: Secondary | ICD-10-CM | POA: Diagnosis not present

## 2023-01-27 DIAGNOSIS — M25562 Pain in left knee: Secondary | ICD-10-CM

## 2023-01-27 DIAGNOSIS — Z23 Encounter for immunization: Secondary | ICD-10-CM | POA: Diagnosis not present

## 2023-01-27 DIAGNOSIS — M6281 Muscle weakness (generalized): Secondary | ICD-10-CM | POA: Diagnosis not present

## 2023-01-27 DIAGNOSIS — R600 Localized edema: Secondary | ICD-10-CM

## 2023-01-27 NOTE — Therapy (Signed)
OUTPATIENT PHYSICAL THERAPY TREATMENT NOTE    Patient Name: Joan [name] MRN: [id number] DOB:03/14/1956, 67 y.o., female Today's Date: 01/27/2023   END OF SESSION:  PT End of Session - 01/27/23 1144     Visit Number 18    Number of Visits 25    Date for PT Re-Evaluation 02/14/23    Authorization Type Medicare & [employer] sup    Progress Note Due on Visit 20    PT Start Time 1144    PT Stop Time 1227    PT Time Calculation (min) 43 min    Equipment Utilized During Treatment Gait belt    Activity Tolerance Patient tolerated treatment well;Patient limited by pain    Behavior During Therapy WFL for tasks assessed/performed                      Past Medical History:  Diagnosis Date   Allergy    Anemia    due to Dysmenorrhea   Arthritis    generalized    Hypertension    initially after BCP initiated    Hypothyroidism    PONV (postoperative nausea and vomiting)    Thyroid disease 2005 & 2009   nodule   Past Surgical History:  Procedure Laterality Date   ABDOMINAL HYSTERECTOMY  1998   for heavy menses and fibroids   BIOPSY THYROID  2005,2009   both negative   CESAREAN SECTION     x2   COLONOSCOPY  2009    Dr [facility]   EXPLORATORY LAPAROTOMY  1985   Ovarian cyst   [place]    LLQ pain due to ovarian cyst   SIGMOIDOSCOPY     TONSILLECTOMY     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   Procedure: LEFT TOTAL KNEE ARTHROPLASTY;  Surgeon: Mcarthur Rossetti, MD;  Location: [facility];  Service: Orthopedics;  Laterality: Left;   Patient Active Problem List   Diagnosis Date Noted   Status post total left knee replacement 11/22/2022   Unilateral primary osteoarthritis, left knee 09/25/2022   Degenerative arthritis of left knee 08/20/2021   Degenerative spondylolisthesis 03/31/2020   Lumbar radiculopathy 03/28/2020   Mid back pain on right side 09/28/2018   Osteopenia 08/19/2018   Hyperglycemia 07/09/2018    Hypothyroidism 06/30/2017   Gallbladder polyp 05/20/2016   Fatty liver 05/20/2016   Obesity 05/01/2016   Vitamin D deficiency 07/14/2013   LOW BACK PAIN SYNDROME 01/23/2010   Status post radioactive iodine thyroid ablation 08/17/2009   Hyperlipidemia 07/26/2008   Essential hypertension 07/26/2008   Pain in joint of left shoulder 07/26/2008   Goiter 07/14/2007    PCP: [name], MD  REFERRING PROVIDER: Jean Rosenthal, MD  REFERRING DIAG: [phone] (ICD-10-CM) - Status post total left knee replacement   THERAPY DIAG:  Acute pain of left knee  Stiffness of left knee, not elsewhere classified  Muscle weakness (generalized)  Localized edema  Other abnormalities of gait and mobility  Rationale for Evaluation and Treatment: Rehabilitation  ONSET DATE: 11/22/2022 TKA sg  SUBJECTIVE:  SUBJECTIVE STATEMENT:    she walked 30+ min daily in neighborhood but her knee hurts.  She went to chair [employer] Friday which was "good."   PERTINENT HISTORY: OA, lumbar radiculopathy / LBP, osteopenia, obesity, s/p radioactive thyroid ablation, HTN  PAIN:  NPRS scale:  today 2-3/10 with range last week 0-3/10 Pain location: Left knee more medial Pain description: sore Aggravating factors: sitting, increasing distance suddenly Relieving  factors: laying down with knee ext,  ice & Aleve/tylenol   PRECAUTIONS: None  WEIGHT BEARING RESTRICTIONS: Yes WBAT LLE  FALLS:  Has patient fallen in last 6 months? No  LIVING ENVIRONMENT: Lives with: lives with their spouse Lives in: House single level Stairs: Yes: External: 3 steps; on left going up Has following equipment at home: Single point cane, Environmental consultant - 2 wheeled, Wheelchair (manual), and Grab bars  OCCUPATION: retired from Retail buyer at [employer],   [place]: Independent  PATIENT GOALS:   improve knees to enable to enjoy retirement including travel  Next MD visit:  01/02/2022   OBJECTIVE:   DIAGNOSTIC FINDINGS: 11/23/2023 Post LEFT  total knee arthroplasty without acute complication.   PATIENT SURVEYS:  12/30/2022: FOTO 58% 12/06/2022:  FOTO intake: 38%   predicted:  60%  SENSATION: WFL  EDEMA:  12/06/2022:  Circumferential:  LLE above knee 50.7cm  around knee 47cm  below knee 40cm RLE above knee 46.7cm  around knee 43.6cm  below knee 36 cm   POSTURE: flexed trunk  and weight shift right  PALPATION: Tenderness medial knee (quad, joint line & proximal tibia), lateral joint line & incision.   LOWER EXTREMITY ROM:   ROM P:passive  A:active Left Eval 12/06/22 Left 12/10/12 Left 12/16/22 Left 12/17/22 Left 12/23/22 Left 12/30/22 Left 01/10/23 Left 01/13/23 Left 01/23/23  Knee flexion Seated A: 83* P: 94* Supine  Supine A: 90 P: 95 Supine A: 94 P: 98 Supine A: 96 P: 100  Supine A: 100* P: 102* Seated P: 110* A: 104* Supine A: 111 Seated P: 118* A: 112* Seated P: 120* A: 113*  Knee extension Seated  A: LAQ -13* Supine P: -8* A: -10* Supine A: -8 P: -4 Supine A: -8 P: -4 Supine A: -5 P: -3 Supine A: -3 P: -2 Seated LAQ -6* Standing  A: 0*  Seated A: LAQ -3* Standing  A: 0* Seated A: LAQ 0*   (Blank rows = not tested)  LOWER EXTREMITY MMT:  MMT Left Eval 12/06/22 Right 01/13/23 Left 01/13/23  Knee flexion 3-/5    Knee extension 3-/5 31.9, 34.1 25.2, 21.8 LLE:RLE 71%   (Blank rows = not tested)   FUNCTIONAL TESTS:  12/06/2022 / Eval: 18 inch chair transfer: requires use of BUEs on armrests, weight shift to RLE, limited knee flex  GAIT: 12/06/2022 / Eval: Distance walked: 100' Assistive device utilized: Single point cane Level of assistance: SBA Comments: LLE stiff leg with flexion in stance & minimal increase knee flex for swing, antalgic with LLE decreased stance time, LLE abducted, trunk flexed   TODAY'S TREATMENT                                                                          DATE:  01/27/2023: TherEx:   Leg press 125# BLEs 15 reps; marching for SLS knee ext  100# 5 reps 3 sets;  LLE only 75# 10 reps. Knee ext machine  BLEs 20# 15 reps; concentric BLEs & isometric / eccentric LLE only 10# 10 reps;  LLE only concentric & eccentric 5# 10 reps Knee flex machine BLEs 35# 15 reps; single LE 15# 10 reps 2 sets ea LE Standing hamstring stretch 30 sec ea LE  2 reps Standing gastroc stretch 30 sec ea LE 2 reps Standing quad stretch 30 sec ea LE 2 reps Standing knee flexion stretch with foot on 2nd step of stairs 10 sec hold 5 reps PT added stretches to HEP with HO, demo & verbal cues. Pt verbalized understanding.  Pt has trip to [place] planned in 3 weeks with family. She wants to increase her walking.  Since she can walk 30 min comfortably now, PT recommended starting with 2-3 x/day then progressing time to 35, 40, 45 min. Pt verbalized understanding. Pt neg stairs with single rail descending & no rail ascending alternating pattern.    01/23/2023: TherEx:   Knee ext machine  BLEs 20# 15 reps; concentric BLEs & isometric / eccentric LLE only 10# 10 reps;  LLE only concentric & eccentric 5# 10 reps Knee flex machine BLEs 35# 15 reps; single LE 15# 10 reps 2 sets ea LE Squat at sink 15 reps PT reviewed 4 components of well rounded fitness program and ongoing exercise plans to maximize TKA recovery. Pt verbalized understanding.      Therapeutic Activities Gait carrying 15# kettle bell in ea UE 100' 2 reps ea UE. Stairs 11 steps 7.5" ht alternating pattern descending 8 steps right rail & 3 steps no rail;  ascending no rail  Kneeling on floor with knees on foam pad with UEs pushing on mat table 60 secs Quadriped on bed 60 sec  Neuromuscular Re-ed: Slider to 3 cones (ant-lat, lat & post-lat) with stance flexion on outward motion & ext on inward motion for 5 reps ea LE.  Modalities:  Vasopneumatic: x 10 minutes, medium compression 34 degrees, Left knee  01/21/2023: TherEx:   Leg press 150# BLEs 15 reps; marching for SLS knee ext 112# 5 reps 3 sets;  LLE  only 75# 10 reps. Knee ext machine  BLEs 20# 15 reps; concentric BLEs & isometric / eccentric LLE only 10# 10 reps;  LLE only concentric & eccentric 5# 10 reps Knee flex machine BLEs 35# 15 reps; single LE 15# 10 reps 2 sets ea LE PT reviewed including Pt notes on weight machines for [employer] over weekend.  Standing hamstring stretch 30 sec ea LE 2 reps Standing gastroc stretch 30 sec ea LE 2 reps Standing quad stretch 30 sec ea LE 2 reps   Modalities:  Vasopneumatic: x 10 minutes, medium compression 34 degrees, Left knee    PATIENT EDUCATION:  Education details: HEP, POC Person educated: Patient & husband Education method: Explanation, Demonstration, Verbal cues, and Handouts Education comprehension: verbalized understanding, returned demonstration, and verbal cues required  HOME EXERCISE PROGRAM: Access Code: [facility] URL: [email or web address] Date: 01/27/2023 Prepared by: Jamey Reas  Exercises - Ankle Alphabet in Elevation  - 2-4 x daily - 7 x weekly - 1 sets - 1 reps - Quad Setting and Stretching  - 2-4 x daily - 7 x weekly - 5-10 sets - 10 reps - prop 5-10 minutes & quad set5 seconds hold - Supine Heel Slide with Strap  - 2-3 x daily - 7 x weekly - 2-3 sets - 10 reps - 5 seconds hold - Supine Knee Extension Strengthening  - 2-3 x daily - 7 x weekly - 2-3 sets - 10 reps - 5 seconds hold - Supine Straight Leg Raises  - 2-3 x daily - 7 x weekly - 2-3 sets - 10 reps - 5 seconds hold - Seated Knee Flexion Extension AROM   - 2-4 x daily - 7 x weekly -  2-3 sets - 10 reps - 5 seconds hold - Seated Long Arc Quad  - 2-3 x daily - 7 x weekly - 2-3 sets - 10 reps - 5 seconds hold - Seated straight leg lifts  - 2-3 x daily - 7 x weekly - 2-3 sets - 10 reps - 5 seconds hold - Seated Hamstring Stretch with Strap  - 2-4 x daily - 7 x weekly - 1 sets - 3 reps - 20-30 seconds hold - Standing Eccentric Heel Raise  - 2 x daily - 7 x weekly - 1 sets - 20 reps - 5 seconds hold -  Standing Soleus Stretch on Step with Counter Support  - 2 x daily - 7 x weekly - 1 sets - 3 reps - 20-30 seconds hold - Squat with Counter Support  - 2 x daily - 7 x weekly - 2 sets - 10 reps - 5 seconds hold - Seated Hamstring Curl with Anchored Resistance  - 1 x daily - 7 x weekly - 2-3 sets - 10 reps - 5 seconds hold - Sitting Knee Extension with Resistance  - 1 x daily - 7 x weekly - 2-3 sets - 10 reps - 5 seconds hold - Standing Terminal Knee Extension with Resistance  - 1 x daily - 7 x weekly - 2-3 sets - 10 reps - 5 seconds hold - Gastroc Stretch on Step  - 2 x daily - 7 x weekly - 1 sets - 3 reps - 20-30 seconds hold - Standing Quad Stretch with Strap  - 1 x daily - 7 x weekly - 1 sets - 3 reps - 30 seconds hold - Standing Hamstring Stretch with Step  - 1 x daily - 7 x weekly - 1 sets - 3 reps - 30 seconds hold  ASSESSMENT: CLINICAL IMPRESSION: Patient appears to understand ongoing HEP / fitness plan.  She appears ready for discharge next visit.   OBJECTIVE IMPAIRMENTS: Abnormal gait, decreased activity tolerance, decreased balance, decreased endurance, decreased knowledge of condition, decreased knowledge of use of DME, decreased mobility, difficulty walking, decreased ROM, decreased strength, increased edema, increased muscle spasms, impaired flexibility, obesity, and pain.   ACTIVITY LIMITATIONS: carrying, lifting, bending, sitting, standing, squatting, sleeping, stairs, transfers, and locomotion level  PARTICIPATION LIMITATIONS: meal prep, cleaning, laundry, driving, and community activity  PERSONAL FACTORS: Fitness and 1-2 comorbidities: see PMH  are also affecting patient's functional outcome.   REHAB POTENTIAL: Good  CLINICAL DECISION MAKING: Stable/uncomplicated  EVALUATION COMPLEXITY: Low   GOALS: Goals reviewed with patient? Yes  SHORT TERM GOALS: (target date for Short term goals are 4 weeks 01/03/2023)   1.  Patient will demonstrate independent use of home exercise  program to maintain progress from in clinic treatments.  Goal status: MET 12/30/22  LONG TERM GOALS: (target dates for all long term goals are 10 weeks  02/14/2023 )   1. Patient will demonstrate/report pain at worst less than or equal to 2/10 to facilitate minimal limitation in daily activity secondary to pain symptoms.  Goal status: New   2. Patient will demonstrate independent use of home exercise program to facilitate ability to maintain/progress functional gains from skilled physical therapy services.  Goal status: New   3. Patient will demonstrate FOTO outcome > or = 60 % to indicate reduced disability due to condition.  Goal status: New   4.  Patient will demonstrate LE MMT 5/5 throughout to faciltiate usual transfers, stairs, squatting at [facility] for daily life.  Goal status: New   5.  Patient ambulates >500' & negotiates ramps, curbs & stairs single rail without assistive device independently. Goal status: New     PLAN:  PT FREQUENCY: 2-3x/week  PT DURATION: 10 weeks  PLANNED INTERVENTIONS: Therapeutic exercises, Therapeutic activity, Neuromuscular re-education, Balance training, Gait training, Patient/Family education, Self Care, Joint mobilization, Stair training, DME instructions, Electrical stimulation, Cryotherapy, Moist heat, scar mobilization, Taping, Vasopneumatic device, Ultrasound, Ionotophoresis '4mg'$ /ml Dexamethasone, and Manual therapy  PLAN FOR NEXT SESSION: check LTGs with plan to discharge on 3/6 PT visit,     Jamey Reas, PT, DPT 01/27/2023, 12:35 PM

## 2023-01-29 ENCOUNTER — Encounter: Payer: Self-pay | Admitting: Physical Therapy

## 2023-01-29 ENCOUNTER — Ambulatory Visit (INDEPENDENT_AMBULATORY_CARE_PROVIDER_SITE_OTHER): Payer: Medicare Other | Admitting: Physical Therapy

## 2023-01-29 DIAGNOSIS — M25662 Stiffness of left knee, not elsewhere classified: Secondary | ICD-10-CM | POA: Diagnosis not present

## 2023-01-29 DIAGNOSIS — R600 Localized edema: Secondary | ICD-10-CM

## 2023-01-29 DIAGNOSIS — M25562 Pain in left knee: Secondary | ICD-10-CM

## 2023-01-29 DIAGNOSIS — M6281 Muscle weakness (generalized): Secondary | ICD-10-CM | POA: Diagnosis not present

## 2023-01-29 NOTE — Therapy (Signed)
OUTPATIENT PHYSICAL THERAPY TREATMENT NOTE & PROGRESS NOTE   Patient Name: Joan [name] MRN: [id number] DOB:January 18, 1956, 67 y.o., female Today's Date: 01/29/2023  Progress Note Reporting Period 01/01/2023 to 01/29/2023  See note below for Objective Data and Assessment of Progress/Goals.      END OF SESSION:  PT End of Session - 01/29/23 1139     Visit Number 19    Number of Visits 25    Date for PT Re-Evaluation 02/14/23    Authorization Type Medicare & [employer] sup    Progress Note Due on Visit --    PT Start Time 1139    PT Stop Time 1232    PT Time Calculation (min) 53 min    Equipment Utilized During Treatment Gait belt    Activity Tolerance Patient tolerated treatment well;Patient limited by pain    Behavior During Therapy WFL for tasks assessed/performed                       Past Medical History:  Diagnosis Date   Allergy    Anemia    due to Dysmenorrhea   Arthritis    generalized    Hypertension    initially after BCP initiated    Hypothyroidism    PONV (postoperative nausea and vomiting)    Thyroid disease 2005 & 2009   nodule   Past Surgical History:  Procedure Laterality Date   ABDOMINAL HYSTERECTOMY  1998   for heavy menses and fibroids   BIOPSY THYROID  2005,2009   both negative   CESAREAN SECTION     x2   COLONOSCOPY  2009    Dr [facility]   EXPLORATORY LAPAROTOMY  1985   Ovarian cyst   [place]    LLQ pain due to ovarian cyst   SIGMOIDOSCOPY     TONSILLECTOMY     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   Procedure: LEFT TOTAL KNEE ARTHROPLASTY;  Surgeon: Mcarthur Rossetti, MD;  Location: [facility];  Service: Orthopedics;  Laterality: Left;   Patient Active Problem List   Diagnosis Date Noted   Status post total left knee replacement 11/22/2022   Unilateral primary osteoarthritis, left knee 09/25/2022   Degenerative arthritis of left knee 08/20/2021   Degenerative spondylolisthesis  03/31/2020   Lumbar radiculopathy 03/28/2020   Mid back pain on right side 09/28/2018   Osteopenia 08/19/2018   Hyperglycemia 07/09/2018   Hypothyroidism 06/30/2017   Gallbladder polyp 05/20/2016   Fatty liver 05/20/2016   Obesity 05/01/2016   Vitamin D deficiency 07/14/2013   LOW BACK PAIN SYNDROME 01/23/2010   Status post radioactive iodine thyroid ablation 08/17/2009   Hyperlipidemia 07/26/2008   Essential hypertension 07/26/2008   Pain in joint of left shoulder 07/26/2008   Goiter 07/14/2007    PCP: [name], MD  REFERRING PROVIDER: Jean Rosenthal, MD  REFERRING DIAG: [phone] (ICD-10-CM) - Status post total left knee replacement   THERAPY DIAG:  Acute pain of left knee  Stiffness of left knee, not elsewhere classified  Muscle weakness (generalized)  Localized edema  Rationale for Evaluation and Treatment: Rehabilitation  ONSET DATE: 11/22/2022 TKA sg  SUBJECTIVE:  SUBJECTIVE STATEMENT:  Her knee has been bothering her more over the last week.    PERTINENT HISTORY: OA, lumbar radiculopathy / LBP, osteopenia, obesity, s/p radioactive thyroid ablation, HTN  PAIN:  NPRS scale:  today 2/10 with range last week 0-3/10 Pain location: Left knee more medial Pain description: sore  Aggravating factors: sitting, increasing distance suddenly Relieving factors: laying down with knee ext,  ice & Aleve/tylenol   PRECAUTIONS: None  WEIGHT BEARING RESTRICTIONS: Yes WBAT LLE  FALLS:  Has patient fallen in last 6 months? No  LIVING ENVIRONMENT: Lives with: lives with their spouse Lives in: House single level Stairs: Yes: External: 3 steps; on left going up Has following equipment at home: Single point cane, Environmental consultant - 2 wheeled, Wheelchair (manual), and Grab bars  OCCUPATION: retired from Retail buyer at [employer],   [place]: Independent  PATIENT GOALS:   improve knees to enable to enjoy retirement including travel  Next MD visit:  01/02/2022   OBJECTIVE:    DIAGNOSTIC FINDINGS: 11/23/2023 Post LEFT total knee arthroplasty without acute complication.   PATIENT SURVEYS:  01/29/2023:  FOTO 63% 12/30/2022: FOTO 58% 12/06/2022:  FOTO intake: 38%   predicted:  60%  SENSATION: 12/06/2022: WFL  EDEMA:  12/06/2022:  Circumferential:  LLE above knee 50.7cm  around knee 47cm  below knee 40cm RLE above knee 46.7cm  around knee 43.6cm  below knee 36 cm   POSTURE: flexed trunk  and weight shift right  PALPATION: Tenderness medial knee (quad, joint line & proximal tibia), lateral joint line & incision.   LOWER EXTREMITY ROM:   ROM P:passive  A:active Left Eval 12/06/22 Left 12/10/12 Left 12/16/22 Left 12/17/22 Left 12/23/22 Left 12/30/22 Left 01/10/23 Left 01/13/23 Left 01/23/23  Knee flexion Seated A: 83* P: 94* Supine  Supine A: 90 P: 95 Supine A: 94 P: 98 Supine A: 96 P: 100  Supine A: 100* P: 102* Seated P: 110* A: 104* Supine A: 111 Seated P: 118* A: 112* Seated P: 120* A: 113*  Knee extension Seated  A: LAQ -13* Supine P: -8* A: -10* Supine A: -8 P: -4 Supine A: -8 P: -4 Supine A: -5 P: -3 Supine A: -3 P: -2 Seated LAQ -6* Standing  A: 0*  Seated A: LAQ -3* Standing  A: 0* Seated A: LAQ 0*   (Blank rows = not tested)  LOWER EXTREMITY MMT:  MMT Left Eval 12/06/22 Right 01/13/23 Left 01/13/23 Left 01/29/23 Right  01/29/23  Knee flexion 3-/5      Knee extension 3-/5 31.9, 34.1 25.2, 21.8 LLE:RLE 71% 39# & 34.6# LLE:RLE 90% 39.6# & 42.4#   (Blank rows = not tested)   FUNCTIONAL TESTS:  01/29/2023: sit to/from stand from 18" chair without UE assist independently   12/06/2022 / Eval: 18 inch chair transfer: requires use of BUEs on armrests, weight shift to RLE, limited knee flex  GAIT: 12/06/2022 / Eval: Distance walked: 100' Assistive device utilized: Single point cane Level of assistance: SBA Comments: LLE stiff leg with flexion in stance & minimal increase knee flex for swing, antalgic with LLE  decreased stance time, LLE abducted, trunk flexed   TODAY'S TREATMENT                                                                          DATE:  01/29/2023: TherEx:   Precor recumbent bike rocking for 2 min as warm-up and level 1 for 2 min, then level 3 for 4 min Standing Left knee flexion stretch with foot in 18" chair & 2nd  chair back for safety 15 sec hold moving RLE stance closer for increased stretch 10 reps.  PT reviewed exercise program to include flexibility, strength, balance & endurance components. Pt verbalized understanding. Per pt request, PT instructed in treadmill use including safety. Pt amb 5 min progressing speed from 1.3 to 2.53mh. cues to increase stride length with increased speed. Recommendation to warm up at 1.3 and progress up to allow muscles to stretch. Pt verbalized understanding.   Neuromuscular Re-ed: Y-reach stance LLE 5 reps with BUE support & 5 reps without UE support - stance knee flexion on outward reach and ext on inward motion. Rolling soccer ball around stance LE for BLEs 3 reps; then ipsilateral UE support 3 reps ea LE;   01/27/2023: TherEx:   Leg press 125# BLEs 15 reps; marching for SLS knee ext 100# 5 reps 3 sets;  LLE only 75# 10 reps. Knee ext machine  BLEs 20# 15 reps; concentric BLEs & isometric / eccentric LLE only 10# 10 reps;  LLE only concentric & eccentric 5# 10 reps Knee flex machine BLEs 35# 15 reps; single LE 15# 10 reps 2 sets ea LE Standing hamstring stretch 30 sec ea LE 2 reps Standing gastroc stretch 30 sec ea LE 2 reps Standing quad stretch 30 sec ea LE 2 reps Standing knee flexion stretch with foot on 2nd step of stairs 10 sec hold 5 reps PT added stretches to HEP with HO, demo & verbal cues. Pt verbalized understanding.  [place] She wants to increase her walking.  Since she can walk 30 min comfortably now, PT recommended starting with 2-3 x/day then progressing time to 35, 40, 45 min.  Pt verbalized understanding. Pt neg stairs with single rail descending & no rail ascending alternating pattern.    01/23/2023: TherEx:   Knee ext machine  BLEs 20# 15 reps; concentric BLEs & isometric / eccentric LLE only 10# 10 reps;  LLE only concentric & eccentric 5# 10 reps Knee flex machine BLEs 35# 15 reps; single LE 15# 10 reps 2 sets ea LE Squat at sink 15 reps PT reviewed 4 components of well rounded fitness program and ongoing exercise plans to maximize TKA recovery. Pt verbalized understanding.      Therapeutic Activities Gait carrying 15# kettle bell in ea UE 100' 2 reps ea UE. Stairs 11 steps 7.5" ht alternating pattern descending 8 steps right rail & 3 steps no rail;  ascending no rail  Kneeling on floor with knees on foam pad with UEs pushing on mat table 60 secs Quadriped on bed 60 sec  Neuromuscular Re-ed: Slider to 3 cones (ant-lat, lat & post-lat) with stance flexion on outward motion & ext on inward motion for 5 reps ea LE.  Modalities:  Vasopneumatic: x 10 minutes, medium compression 34 degrees, Left knee   PATIENT EDUCATION:  Education details: HEP, POC Person educated: Patient & husband Education method: Explanation, Demonstration, Verbal cues, and Handouts Education comprehension: verbalized understanding, returned demonstration, and verbal cues required  HOME EXERCISE PROGRAM: [facility]: [email or web address] Date: 01/27/2023 Prepared by: RJamey Reas Exercises - Ankle Alphabet in Elevation  - 2-4 x daily - 7 x weekly - 1 sets - 1 reps - Quad Setting and Stretching  - 2-4 x daily - 7 x weekly - 5-10 sets - 10 reps - prop 5-10 minutes & quad set5 seconds hold - Supine Heel Slide with Strap  - 2-3 x daily -  7 x weekly - 2-3 sets - 10 reps - 5 seconds hold - Supine Knee Extension Strengthening  - 2-3 x daily - 7 x weekly - 2-3 sets - 10 reps - 5 seconds hold - Supine Straight Leg Raises  - 2-3 x daily - 7 x weekly - 2-3  sets - 10 reps - 5 seconds hold - Seated Knee Flexion Extension AROM   - 2-4 x daily - 7 x weekly - 2-3 sets - 10 reps - 5 seconds hold - Seated Long Arc Quad  - 2-3 x daily - 7 x weekly - 2-3 sets - 10 reps - 5 seconds hold - Seated straight leg lifts  - 2-3 x daily - 7 x weekly - 2-3 sets - 10 reps - 5 seconds hold - Seated Hamstring Stretch with Strap  - 2-4 x daily - 7 x weekly - 1 sets - 3 reps - 20-30 seconds hold - Standing Eccentric Heel Raise  - 2 x daily - 7 x weekly - 1 sets - 20 reps - 5 seconds hold - Standing Soleus Stretch on Step with Counter Support  - 2 x daily - 7 x weekly - 1 sets - 3 reps - 20-30 seconds hold - Squat with Counter Support  - 2 x daily - 7 x weekly - 2 sets - 10 reps - 5 seconds hold - Seated Hamstring Curl with Anchored Resistance  - 1 x daily - 7 x weekly - 2-3 sets - 10 reps - 5 seconds hold - Sitting Knee Extension with Resistance  - 1 x daily - 7 x weekly - 2-3 sets - 10 reps - 5 seconds hold - Standing Terminal Knee Extension with Resistance  - 1 x daily - 7 x weekly - 2-3 sets - 10 reps - 5 seconds hold - Gastroc Stretch on Step  - 2 x daily - 7 x weekly - 1 sets - 3 reps - 20-30 seconds hold - Standing Quad Stretch with Strap  - 1 x daily - 7 x weekly - 1 sets - 3 reps - 30 seconds hold - Standing Hamstring Stretch with Step  - 1 x daily - 7 x weekly - 1 sets - 3 reps - 30 seconds hold  ASSESSMENT: CLINICAL IMPRESSION: Patient has improved her FOTO score, ROM & strength with PT including instructions for exercises at [facility].  Her pain after activities and antalgic gait have increased over the last week.  PT was anticipating early discharge but pt & PT feel she could benefit from a few more weeks to end of Plan of Care to maximize function.   OBJECTIVE IMPAIRMENTS: Abnormal gait, decreased activity tolerance, decreased balance, decreased endurance, decreased knowledge of condition, decreased knowledge of use of DME, decreased mobility, difficulty walking,  decreased ROM, decreased strength, increased edema, increased muscle spasms, impaired flexibility, obesity, and pain.   ACTIVITY LIMITATIONS: carrying, lifting, bending, sitting, standing, squatting, sleeping, stairs, transfers, and locomotion level  PARTICIPATION LIMITATIONS: meal prep, cleaning, laundry, driving, and community activity  PERSONAL FACTORS: Fitness and 1-2 comorbidities: see PMH  are also affecting patient's functional outcome.   REHAB POTENTIAL: Good  CLINICAL DECISION MAKING: Stable/uncomplicated  EVALUATION COMPLEXITY: Low   GOALS: Goals reviewed with patient? Yes  SHORT TERM GOALS: (target date for Short term goals are 4 weeks 01/03/2023)   1.  Patient will demonstrate independent use of home exercise program to maintain progress from in clinic treatments.  Goal status: MET 12/30/22  LONG TERM  GOALS: (target dates for all long term goals are 10 weeks  02/14/2023 )   1. Patient will demonstrate/report pain at worst less than or equal to 2/10 to facilitate minimal limitation in daily activity secondary to pain symptoms.  Goal status: New   2. Patient will demonstrate independent use of home exercise program to facilitate ability to maintain/progress functional gains from skilled physical therapy services.  Goal status: New   3. Patient will demonstrate FOTO outcome > or = 60 % to indicate reduced disability due to condition.  Goal status: New   4.  Patient will demonstrate LE MMT 5/5 throughout to faciltiate usual transfers, stairs, squatting at [facility] for daily life.   Goal status: New   5.  Patient ambulates >500' & negotiates ramps, curbs & stairs single rail without assistive device independently. Goal status: New     PLAN:  PT FREQUENCY: 2-3x/week  PT DURATION: 10 weeks  PLANNED INTERVENTIONS: Therapeutic exercises, Therapeutic activity, Neuromuscular re-education, Balance training, Gait training, Patient/Family education, Self Care, Joint  mobilization, Stair training, DME instructions, Electrical stimulation, Cryotherapy, Moist heat, scar mobilization, Taping, Vasopneumatic device, Ultrasound, Ionotophoresis '4mg'$ /ml Dexamethasone, and Manual therapy  PLAN FOR NEXT SESSION: continue with exercises & balance activities to progress knee strength & function.    Jamey Reas, PT, DPT 01/29/2023, 12:49 PM

## 2023-01-30 ENCOUNTER — Encounter: Payer: Self-pay | Admitting: Orthopaedic Surgery

## 2023-01-30 ENCOUNTER — Ambulatory Visit (INDEPENDENT_AMBULATORY_CARE_PROVIDER_SITE_OTHER): Payer: Medicare Other | Admitting: Orthopaedic Surgery

## 2023-01-30 ENCOUNTER — Encounter: Payer: Self-pay | Admitting: Radiology

## 2023-01-30 DIAGNOSIS — Z96652 Presence of left artificial knee joint: Secondary | ICD-10-CM

## 2023-01-30 NOTE — Progress Notes (Signed)
The patient is now 10 weeks status post a left total knee arthroplasty.  She said she is going to physical therapy and making good progress but was walking a lot over the weekend and got some soreness after then.  She is only taken Aleve.  On exam her extension is almost full and her flexion is past 110 degrees.  The knee feels ligamentously stable.  There is still swelling to be expected.  She will continue to increase her activities as comfort allows.  The next time I need to see her is not for 3 months.  Will have a standing AP and lateral of her left operative knee at that visit.

## 2023-02-03 ENCOUNTER — Encounter: Payer: Self-pay | Admitting: Physical Therapy

## 2023-02-03 ENCOUNTER — Ambulatory Visit (INDEPENDENT_AMBULATORY_CARE_PROVIDER_SITE_OTHER): Payer: Medicare Other | Admitting: Physical Therapy

## 2023-02-03 DIAGNOSIS — M6281 Muscle weakness (generalized): Secondary | ICD-10-CM | POA: Diagnosis not present

## 2023-02-03 DIAGNOSIS — M25662 Stiffness of left knee, not elsewhere classified: Secondary | ICD-10-CM | POA: Diagnosis not present

## 2023-02-03 DIAGNOSIS — M25562 Pain in left knee: Secondary | ICD-10-CM | POA: Diagnosis not present

## 2023-02-03 DIAGNOSIS — R2689 Other abnormalities of gait and mobility: Secondary | ICD-10-CM

## 2023-02-03 DIAGNOSIS — R600 Localized edema: Secondary | ICD-10-CM

## 2023-02-03 NOTE — Progress Notes (Unsigned)
[place] [address] [place] Phone: [phone] Subjective:    I'm seeing this patient by the request  of:  [name], MD  CC:   [id number]  Joan [name] is a 67 y.o. female coming in with complaint of L knee pain. Last seen in October 2023 and TKR in December 2023 by Dr. Ninfa Linden. Patient states    Past Medical History:  Diagnosis Date   Allergy    Anemia    due to Dysmenorrhea   Arthritis    generalized    Hypertension    initially after BCP initiated    Hypothyroidism    PONV (postoperative nausea and vomiting)    Thyroid disease 2005 & 2009   nodule   Past Surgical History:  Procedure Laterality Date   ABDOMINAL HYSTERECTOMY  1998   for heavy menses and fibroids   BIOPSY THYROID  2005,2009   both negative   CESAREAN SECTION     x2   COLONOSCOPY  2009    Dr Annette [facility]   EXPLORATORY LAPAROTOMY  1985   Ovarian cyst   [place]    LLQ pain due to ovarian cyst   SIGMOIDOSCOPY     TONSILLECTOMY     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   Procedure: LEFT TOTAL KNEE ARTHROPLASTY;  Surgeon: Mcarthur Rossetti, MD;  Location: [facility];  Service: Orthopedics;  Laterality: Left;   Social History   Socioeconomic History   Marital status: Married    Spouse name: Not on file   Number of children: Not on file   Years of education: Not on file   Highest education level: Not on file  Occupational History   Not on file  Tobacco Use   Smoking status: Never   Smokeless tobacco: Never  Vaping Use   Vaping Use: Never used  Substance and Sexual Activity   Alcohol use: No   Drug use: No   Sexual activity: Yes    Birth control/protection: Surgical  Other Topics Concern   Not on file  Social History Narrative   RN at [employer]      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      Social Determinants of Health   Financial Resource Strain: Low Risk  (04/05/2022)   Overall Financial  Resource Strain (CARDIA)    Difficulty of Paying Living Expenses: Not hard at all  Food Insecurity: No Food Insecurity (11/22/2022)   Hunger Vital Sign    Worried About Running Out of Food in the Last Year: Never true    Ran Out of Food in the Last Year: Never true  Transportation Needs: No Transportation Needs (11/22/2022)   PRAPARE - Hydrologist (Medical): No    Lack of Transportation (Non-Medical): No  Physical Activity: Sufficiently Active (04/05/2022)   Exercise Vital Sign    Days of Exercise per Week: 5 days    Minutes of Exercise per Session: 30 min  Stress: No Stress Concern Present (04/05/2022)   [place]    Feeling of Stress : Not at all  Social Connections: [place] (04/05/2022)   Social Connection and Isolation Panel [NHANES]    Frequency of Communication with Friends and Family: More than three times a week    Frequency of Social Gatherings with Friends and Family: Not on file    Attends Religious Services: More than 4 times per year  Active Member of Clubs or Organizations: Yes    Attends Music therapist: More than 4 times per year    Marital Status: Married   Allergies  Allergen Reactions   Sulfonamide Derivatives Rash    Medi Alert bracelet  is recommended     Codeine Nausea Only   Tramadol Nausea And Vomiting   Family History  Problem Relation Age of Onset   Hypertension Father    Kidney failure Father        Bright's Disease; died @ 70   Hypertension Mother    Hyperlipidemia Mother    Osteoarthritis Mother    Hypertension Brother    Diabetes Brother    Stroke Maternal Grandmother        in 80s   Hypertension Maternal Aunt        also DJD/ OA   Breast cancer Maternal Aunt    Heart attack Maternal Grandfather 69       also  CVA   Heart block Paternal Grandmother        pacemaker   Colon cancer Neg Hx    Colon polyps Neg Hx     Esophageal cancer Neg Hx    Rectal cancer Neg Hx    Stomach cancer Neg Hx     Current Outpatient Medications (Endocrine & Metabolic):    estradiol (VIVELLE-DOT) 0.05 MG/24HR patch, APPLY 1 PATCH ONTO THE SKIN 2 TIMES WEEKLY   SYNTHROID 100 MCG tablet, Take 1 tablet (100 mcg total) by mouth every morning on an empty stomach  Current Outpatient Medications (Cardiovascular):    amLODipine-benazepril (LOTREL) 5-20 MG capsule, Take 1 capsule by mouth daily. Keep scheduled appt for future refills   triamterene-hydrochlorothiazide (DYAZIDE) 37.5-25 MG capsule, Take 1 capsule total by mouth daily.  Current Outpatient Medications (Respiratory):    cetirizine (ZYRTEC) 10 MG tablet, Take 10 mg by mouth at bedtime.  Current Outpatient Medications (Analgesics):    aspirin 81 MG chewable tablet, Chew 1 tablet (81 mg total) by mouth 2 (two) times daily.   naproxen sodium (ALEVE) 220 MG tablet, Take 220 mg by mouth 2 (two) times daily with a meal.   oxyCODONE (OXY IR/ROXICODONE) 5 MG immediate release tablet, Take 1-2 tablets (5-10 mg total) by mouth every 4 (four) hours as needed for moderate pain (pain score 4-6).   Current Outpatient Medications (Other):    Glucosamine-Chondroit-Vit C-Mn (GLUCOSAMINE 1500 COMPLEX PO), Take 1,500 mg by mouth 2 (two) times daily. Glucosamine 1500 mg bid; Chondrotin 1100 mg bid   methocarbamol (ROBAXIN) 500 MG tablet, Take 1 tablet (500 mg total) by mouth every 6 (six) hours as needed for muscle spasms.   Multiple Vitamin (MULTIVITAMIN WITH MINERALS) TABS tablet, Take 1 tablet by mouth daily.   nystatin-triamcinolone ointment (MYCOLOG), APPLY TO THE AFFECTED AREA(S) 2 TIMES PER DAY   Omega-3 Fatty Acids (FISH OIL OMEGA-3 PO), Take 1 capsule by mouth 2 (two) times daily.   TART CHERRY PO, Take 1,200 mg by mouth daily.   Turmeric 500 MG CAPS, Take 1,500 mg by mouth daily.   Vitamin D, Ergocalciferol, (DRISDOL) 1.25 MG (50000 UNIT) CAPS capsule, Take 1 capsule by mouth  once a week   Reviewed prior external information including notes and imaging from  primary care provider As well as notes that were available from care everywhere and other healthcare systems.  Past medical history, social, surgical and family history all reviewed in electronic medical record.  No pertanent information unless stated regarding to the chief  complaint.   Review of Systems:  No headache, visual changes, nausea, vomiting, diarrhea, constipation, dizziness, abdominal pain, skin rash, fevers, chills, night sweats, weight loss, swollen lymph nodes, body aches, joint swelling, chest pain, shortness of breath, mood changes. POSITIVE muscle aches  Objective  There were no vitals taken for this visit.   General: No apparent distress alert and oriented x3 mood and affect normal, dressed appropriately.  HEENT: Pupils equal, extraocular movements intact  Respiratory: Patient's speak in full sentences and does not appear short of breath  Cardiovascular: No lower extremity edema, non tender, no erythema      Impression and Recommendations:

## 2023-02-03 NOTE — Therapy (Signed)
OUTPATIENT PHYSICAL THERAPY TREATMENT NOTE   Patient Name: Joan [name] MRN: [id number] DOB:11-16-1956, 67 y.o., female Today's Date: 02/03/2023   END OF SESSION:  PT End of Session - 02/03/23 1104     Visit Number 20    Number of Visits 25    Date for PT Re-Evaluation 02/14/23    Authorization Type Medicare & [employer] sup    PT Start Time 1103    PT Stop Time 1144    PT Time Calculation (min) 41 min    Equipment Utilized During Treatment Gait belt    Activity Tolerance Patient tolerated treatment well;Patient limited by pain    Behavior During Therapy WFL for tasks assessed/performed                       Past Medical History:  Diagnosis Date   Allergy    Anemia    due to Dysmenorrhea   Arthritis    generalized    Hypertension    initially after BCP initiated    Hypothyroidism    PONV (postoperative nausea and vomiting)    Thyroid disease 2005 & 2009   nodule   Past Surgical History:  Procedure Laterality Date   ABDOMINAL HYSTERECTOMY  1998   for heavy menses and fibroids   BIOPSY THYROID  2005,2009   both negative   CESAREAN SECTION     x2   COLONOSCOPY  2009    Dr [facility]   EXPLORATORY LAPAROTOMY  1985   Ovarian cyst   [place]    LLQ pain due to ovarian cyst   SIGMOIDOSCOPY     TONSILLECTOMY     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   Procedure: LEFT TOTAL KNEE ARTHROPLASTY;  Surgeon: Mcarthur Rossetti, MD;  Location: [facility];  Service: Orthopedics;  Laterality: Left;   Patient Active Problem List   Diagnosis Date Noted   Status post total left knee replacement 11/22/2022   Unilateral primary osteoarthritis, left knee 09/25/2022   Degenerative arthritis of left knee 08/20/2021   Degenerative spondylolisthesis 03/31/2020   Lumbar radiculopathy 03/28/2020   Mid back pain on right side 09/28/2018   Osteopenia 08/19/2018   Hyperglycemia 07/09/2018   Hypothyroidism 06/30/2017   Gallbladder  polyp 05/20/2016   Fatty liver 05/20/2016   Obesity 05/01/2016   Vitamin D deficiency 07/14/2013   LOW BACK PAIN SYNDROME 01/23/2010   Status post radioactive iodine thyroid ablation 08/17/2009   Hyperlipidemia 07/26/2008   Essential hypertension 07/26/2008   Pain in joint of left shoulder 07/26/2008   Goiter 07/14/2007    PCP: [name], MD  REFERRING PROVIDER: Jean Rosenthal, MD  REFERRING DIAG: [phone] (ICD-10-CM) - Status post total left knee replacement   THERAPY DIAG:  Acute pain of left knee  Stiffness of left knee, not elsewhere classified  Muscle weakness (generalized)  Localized edema  Other abnormalities of gait and mobility  Rationale for Evaluation and Treatment: Rehabilitation  ONSET DATE: 11/22/2022 TKA sg  SUBJECTIVE:  SUBJECTIVE STATEMENT:  She went to [facility] Thursday to bike and Friday for chair Yoga class with no issues.  She did not do weights as knee is still bothering her.     PERTINENT HISTORY: OA, lumbar radiculopathy / LBP, osteopenia, obesity, s/p radioactive thyroid ablation, HTN  PAIN:  NPRS scale:  today 1/10 with range last week 0-3/10 Pain location: Left knee more medial Pain description: sore Aggravating factors: sitting, increasing distance suddenly Relieving factors: laying  down with knee ext,  ice & Aleve/tylenol   PRECAUTIONS: None  WEIGHT BEARING RESTRICTIONS: Yes WBAT LLE  FALLS:  Has patient fallen in last 6 months? No  LIVING ENVIRONMENT: Lives with: lives with their spouse Lives in: House single level Stairs: Yes: External: 3 steps; on left going up Has following equipment at home: Single point cane, Environmental consultant - 2 wheeled, Wheelchair (manual), and Grab bars  OCCUPATION: retired from Retail buyer at [employer],   [place]: Independent  PATIENT GOALS:   improve knees to enable to enjoy retirement including travel  Next MD visit:  01/02/2022   OBJECTIVE:   DIAGNOSTIC FINDINGS: 11/23/2023 Post LEFT total knee  arthroplasty without acute complication.   PATIENT SURVEYS:  01/29/2023:  FOTO 63% 12/30/2022: FOTO 58% 12/06/2022:  FOTO intake: 38%   predicted:  60%  SENSATION: 12/06/2022: WFL  EDEMA:  12/06/2022:  Circumferential:  LLE above knee 50.7cm  around knee 47cm  below knee 40cm RLE above knee 46.7cm  around knee 43.6cm  below knee 36 cm   POSTURE: flexed trunk  and weight shift right  PALPATION: Tenderness medial knee (quad, joint line & proximal tibia), lateral joint line & incision.   LOWER EXTREMITY ROM:   ROM P:passive  A:active Left Eval 12/06/22 Left 12/10/12 Left 12/16/22 Left 12/17/22 Left 12/23/22 Left 12/30/22 Left 01/10/23 Left 01/13/23 Left 01/23/23  Knee flexion Seated A: 83* P: 94* Supine  Supine A: 90 P: 95 Supine A: 94 P: 98 Supine A: 96 P: 100  Supine A: 100* P: 102* Seated P: 110* A: 104* Supine A: 111 Seated P: 118* A: 112* Seated P: 120* A: 113*  Knee extension Seated  A: LAQ -13* Supine P: -8* A: -10* Supine A: -8 P: -4 Supine A: -8 P: -4 Supine A: -5 P: -3 Supine A: -3 P: -2 Seated LAQ -6* Standing  A: 0*  Seated A: LAQ -3* Standing  A: 0* Seated A: LAQ 0*   (Blank rows = not tested)  LOWER EXTREMITY MMT:  MMT Left Eval 12/06/22 Right 01/13/23 Left 01/13/23 Left 01/29/23 Right  01/29/23  Knee flexion 3-/5      Knee extension 3-/5 31.9, 34.1 25.2, 21.8 LLE:RLE 71% 39# & 34.6# LLE:RLE 90% 39.6# & 42.4#   (Blank rows = not tested)   FUNCTIONAL TESTS:  01/29/2023: sit to/from stand from 18" chair without UE assist independently   12/06/2022 / Eval: 18 inch chair transfer: requires use of BUEs on armrests, weight shift to RLE, limited knee flex  GAIT: 12/06/2022 / Eval: Distance walked: 100' Assistive device utilized: Single point cane Level of assistance: SBA Comments: LLE stiff leg with flexion in stance & minimal increase knee flex for swing, antalgic with LLE decreased stance time, LLE abducted, trunk flexed   TODAY'S  TREATMENT                                                                          DATE:  02/03/2023: TherEx:   Treadmill 1.3 mph for 1 min warm-up, increase 0.48mh every 30 sec until 2.055m for last 1.5 min.  Total 6 min. Nustep recumbent stepper seat 7 1 min, seat 6 1 min, seat 5 3 min.  Using machine to increase  flexion. Pt verbalized understanding. PT recommending using stepper at [facility] moving seat forward to flex knee warm-up, then use both recumbent bike & treadmill for 20-30 min total. Pt verbalized understanding.  PT educated on wearing old shoes with old wear pattern & time used for compression loss.  Pt verbalized understanding.  Squat at sink over chair for knee flexion.  PT verbal cues on technique. 15 reps. Leg press 125# BLEs 15 reps; marching with SLS knee terminal ext 100# 5 reps 3 sets;  LLE only 75# 10 reps Step up on BOSU round side up with blue theraband terminal knee ext  BUE support 15 reps 2 sets.   Neuromuscular Re-ed: Slider 5 directions (across midline anteriorly, ant-lat, lat, post-lat & across midline posteriorly) LLE  stance knee flexion on outward reach and ext on inward motion. 10 reps  01/29/2023: TherEx:   Precor recumbent bike rocking for 2 min as warm-up and level 1 for 2 min, then level 3 for 4 min Standing Left knee flexion stretch with foot in 18" chair & 2nd chair back for safety 15 sec hold moving RLE stance closer for increased stretch 10 reps.  PT reviewed exercise program to include flexibility, strength, balance & endurance components. Pt verbalized understanding. Per pt request, PT instructed in treadmill use including safety. Pt amb 5 min progressing speed from 1.3 to 2.59mh. cues to increase stride length with increased speed. Recommendation to warm up at 1.3 and progress up to allow muscles to stretch. Pt verbalized understanding.   Neuromuscular Re-ed: Y-reach stance LLE 5 reps with BUE support & 5 reps without UE support - stance knee flexion on  outward reach and ext on inward motion. Rolling soccer ball around stance LE for BLEs 3 reps; then ipsilateral UE support 3 reps ea LE;   01/27/2023: TherEx:   Leg press 125# BLEs 15 reps; marching for SLS knee ext 100# 5 reps 3 sets;  LLE only 75# 10 reps. Knee ext machine  BLEs 20# 15 reps; concentric BLEs & isometric / eccentric LLE only 10# 10 reps;  LLE only concentric & eccentric 5# 10 reps Knee flex machine BLEs 35# 15 reps; single LE 15# 10 reps 2 sets ea LE Standing hamstring stretch 30 sec ea LE 2 reps Standing gastroc stretch 30 sec ea LE 2 reps Standing quad stretch 30 sec ea LE 2 reps Standing knee flexion stretch with foot on 2nd step of stairs 10 sec hold 5 reps PT added stretches to HEP with HO, demo & verbal cues. Pt verbalized understanding.  [place] She wants to increase her walking.  Since she can walk 30 min comfortably now, PT recommended starting with 2-3 x/day then progressing time to 35, 40, 45 min. Pt verbalized understanding. Pt neg stairs with single rail descending & no rail ascending alternating pattern.   PATIENT EDUCATION:  Education details: HEP, POC Person educated: Patient & husband Education method: Explanation, DMedia planner Verbal cues, and Handouts Education comprehension: verbalized understanding, returned demonstration, and verbal cues required  HOME EXERCISE PROGRAM: [facility]: [email or web address] Date: 01/27/2023 Prepared by: RJamey Reas Exercises - Ankle Alphabet in Elevation  - 2-4 x daily - 7 x weekly - 1 sets - 1 reps - Quad Setting and Stretching  - 2-4 x daily - 7 x weekly - 5-10 sets - 10 reps - prop 5-10 minutes & quad set5 seconds hold - Supine Heel Slide with Strap  -  2-3 x daily - 7 x weekly - 2-3 sets - 10 reps - 5 seconds hold - Supine Knee Extension Strengthening  - 2-3 x daily - 7 x weekly - 2-3 sets - 10 reps - 5 seconds hold - Supine Straight  Leg Raises  - 2-3 x daily - 7 x weekly - 2-3 sets - 10 reps - 5 seconds hold - Seated Knee Flexion Extension AROM   - 2-4 x daily - 7 x weekly - 2-3 sets - 10 reps - 5 seconds hold - Seated Long Arc Quad  - 2-3 x daily - 7 x weekly - 2-3 sets - 10 reps - 5 seconds hold - Seated straight leg lifts  - 2-3 x daily - 7 x weekly - 2-3 sets - 10 reps - 5 seconds hold - Seated Hamstring Stretch with Strap  - 2-4 x daily - 7 x weekly - 1 sets - 3 reps - 20-30 seconds hold - Standing Eccentric Heel Raise  - 2 x daily - 7 x weekly - 1 sets - 20 reps - 5 seconds hold - Standing Soleus Stretch on Step with Counter Support  - 2 x daily - 7 x weekly - 1 sets - 3 reps - 20-30 seconds hold - Squat with Counter Support  - 2 x daily - 7 x weekly - 2 sets - 10 reps - 5 seconds hold - Seated Hamstring Curl with Anchored Resistance  - 1 x daily - 7 x weekly - 2-3 sets - 10 reps - 5 seconds hold - Sitting Knee Extension with Resistance  - 1 x daily - 7 x weekly - 2-3 sets - 10 reps - 5 seconds hold - Standing Terminal Knee Extension with Resistance  - 1 x daily - 7 x weekly - 2-3 sets - 10 reps - 5 seconds hold - Gastroc Stretch on Step  - 2 x daily - 7 x weekly - 1 sets - 3 reps - 20-30 seconds hold - Standing Quad Stretch with Strap  - 1 x daily - 7 x weekly - 1 sets - 3 reps - 30 seconds hold - Standing Hamstring Stretch with Step  - 1 x daily - 7 x weekly - 1 sets - 3 reps - 30 seconds hold  ASSESSMENT: CLINICAL IMPRESSION: PT instructed in using cardio equipment for range & muscle endurance which she appears to understand. 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  She continues to benefit from skilled PT.   OBJECTIVE IMPAIRMENTS: Abnormal gait, decreased activity tolerance, decreased balance, decreased endurance, decreased knowledge of condition, decreased knowledge of use of DME, decreased mobility, difficulty walking, decreased ROM, decreased strength, increased edema, increased muscle spasms, impaired  flexibility, obesity, and pain.   ACTIVITY LIMITATIONS: carrying, lifting, bending, sitting, standing, squatting, sleeping, stairs, transfers, and locomotion level  PARTICIPATION LIMITATIONS: meal prep, cleaning, laundry, driving, and community activity  PERSONAL FACTORS: Fitness and 1-2 comorbidities: see PMH  are also affecting patient's functional outcome.   REHAB POTENTIAL: Good  CLINICAL DECISION MAKING: Stable/uncomplicated  EVALUATION COMPLEXITY: Low   GOALS: Goals reviewed with patient? Yes  SHORT TERM GOALS: (target date for Short term goals are 4 weeks 01/03/2023)   1.  Patient will demonstrate independent use of home exercise program to maintain progress from in clinic treatments.  Goal status: MET 12/30/22  LONG TERM GOALS: (target dates for all long term goals are 10 weeks  02/14/2023 )   1. Patient will demonstrate/report pain at worst less  than or equal to 2/10 to facilitate minimal limitation in daily activity secondary to pain symptoms.  Goal status: New   2. Patient will demonstrate independent use of home exercise program to facilitate ability to maintain/progress functional gains from skilled physical therapy services.  Goal status: New   3. Patient will demonstrate FOTO outcome > or = 60 % to indicate reduced disability due to condition.  Goal status: New   4.  Patient will demonstrate LE MMT 5/5 throughout to faciltiate usual transfers, stairs, squatting at [facility] for daily life.   Goal status: New   5.  Patient ambulates >500' & negotiates ramps, curbs & stairs single rail without assistive device independently. Goal status: New     PLAN:  PT FREQUENCY: 2-3x/week  PT DURATION: 10 weeks  PLANNED INTERVENTIONS: Therapeutic exercises, Therapeutic activity, Neuromuscular re-education, Balance training, Gait training, Patient/Family education, Self Care, Joint mobilization, Stair training, DME instructions, Electrical stimulation, Cryotherapy, Moist  heat, scar mobilization, Taping, Vasopneumatic device, Ultrasound, Ionotophoresis '4mg'$ /ml Dexamethasone, and Manual therapy  PLAN FOR NEXT SESSION: check AROM,  continue with exercises & balance activities to progress knee strength & function.    Jamey Reas, PT, DPT 02/03/2023, 12:49 PM

## 2023-02-04 ENCOUNTER — Encounter: Payer: Self-pay | Admitting: Family Medicine

## 2023-02-04 ENCOUNTER — Ambulatory Visit: Payer: Self-pay

## 2023-02-04 ENCOUNTER — Ambulatory Visit (INDEPENDENT_AMBULATORY_CARE_PROVIDER_SITE_OTHER): Payer: Medicare Other | Admitting: Family Medicine

## 2023-02-04 VITALS — BP 152/80 | HR 94 | Ht 64.0 in | Wt 197.0 lb

## 2023-02-04 DIAGNOSIS — G8929 Other chronic pain: Secondary | ICD-10-CM | POA: Diagnosis not present

## 2023-02-04 DIAGNOSIS — M5416 Radiculopathy, lumbar region: Secondary | ICD-10-CM

## 2023-02-04 DIAGNOSIS — Z96652 Presence of left artificial knee joint: Secondary | ICD-10-CM

## 2023-02-04 DIAGNOSIS — M25562 Pain in left knee: Secondary | ICD-10-CM

## 2023-02-04 NOTE — Assessment & Plan Note (Addendum)
Patient still has some facet arthropathy noted.  Does have the. 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  Discussed if worsening pain consider facet injections at the L3-L4 and L4-L5 on the right side if necessary.  Patient was to see how she does with without it at the moment.  Discussed icing regimen and core strengthening and follow-up with me again time with patient today 31 minutes which does not include the procedure of ultrasound.

## 2023-02-04 NOTE — Assessment & Plan Note (Signed)
Still has some mild swelling noted on ultrasound today.  No signs of any infectious etiology.  Patient was given which we discussed potentially with prednisone with patient recent trip.  Patient declined at the moment.  Discussed with patient that I do feel that the antalgic gait is potentially contributing and giving her more of the back pain.  We discussed we could consider another nerve root injection but made a significant improvement previously but patient would like to avoid.  Follow-up with me again in 3 months otherwise.  Total time with patient 33 minutes

## 2023-02-04 NOTE — Patient Instructions (Addendum)
Good to see you  I think we are making great progress Ice the knee regularly  Take the aleve and tylenol while [place]  See me again in 3 months

## 2023-02-05 ENCOUNTER — Ambulatory Visit (INDEPENDENT_AMBULATORY_CARE_PROVIDER_SITE_OTHER): Payer: Medicare Other | Admitting: Physical Therapy

## 2023-02-05 ENCOUNTER — Encounter: Payer: Self-pay | Admitting: Physical Therapy

## 2023-02-05 DIAGNOSIS — R2689 Other abnormalities of gait and mobility: Secondary | ICD-10-CM

## 2023-02-05 DIAGNOSIS — M25562 Pain in left knee: Secondary | ICD-10-CM

## 2023-02-05 DIAGNOSIS — M25662 Stiffness of left knee, not elsewhere classified: Secondary | ICD-10-CM | POA: Diagnosis not present

## 2023-02-05 DIAGNOSIS — M6281 Muscle weakness (generalized): Secondary | ICD-10-CM

## 2023-02-05 DIAGNOSIS — R600 Localized edema: Secondary | ICD-10-CM | POA: Diagnosis not present

## 2023-02-05 NOTE — Therapy (Signed)
OUTPATIENT PHYSICAL THERAPY TREATMENT NOTE   Patient Name: Joan [name] MRN: [id number] DOB:[birth year], 67 y.o., female Today's Date: 02/05/2023   END OF SESSION:  PT End of Session - 02/05/23 1149     Visit Number 21    Number of Visits 25    Date for PT Re-Evaluation 02/14/23    Authorization Type Medicare & [employer] sup    PT Start Time 1145    PT Stop Time 1225    PT Time Calculation (min) 40 min    Equipment Utilized During Treatment Gait belt    Activity Tolerance Patient tolerated treatment well;Patient limited by pain    Behavior During Therapy WFL for tasks assessed/performed                       Past Medical History:  Diagnosis Date   Allergy    Anemia    due to Dysmenorrhea   Arthritis    generalized    Hypertension    initially after BCP initiated    Hypothyroidism    PONV (postoperative nausea and vomiting)    Thyroid disease 2005 & 2009   nodule   Past Surgical History:  Procedure Laterality Date   ABDOMINAL HYSTERECTOMY  1998   for heavy menses and fibroids   BIOPSY THYROID  2005,2009   both negative   CESAREAN SECTION     x2   COLONOSCOPY  2009    Dr [facility]   EXPLORATORY LAPAROTOMY  1985   Ovarian cyst   [place]    LLQ pain due to ovarian cyst   SIGMOIDOSCOPY     TONSILLECTOMY     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   Procedure: LEFT TOTAL KNEE ARTHROPLASTY;  Surgeon: Mcarthur Rossetti, MD;  Location: [facility];  Service: Orthopedics;  Laterality: Left;   Patient Active Problem List   Diagnosis Date Noted   Status post total left knee replacement 11/22/2022   Unilateral primary osteoarthritis, left knee 09/25/2022   Degenerative arthritis of left knee 08/20/2021   Degenerative spondylolisthesis 03/31/2020   Lumbar radiculopathy 03/28/2020   Mid back pain on right side 09/28/2018   Osteopenia 08/19/2018   Hyperglycemia 07/09/2018   Hypothyroidism 06/30/2017   Gallbladder  polyp 05/20/2016   Fatty liver 05/20/2016   Obesity 05/01/2016   Vitamin D deficiency 07/14/2013   LOW BACK PAIN SYNDROME 01/23/2010   Status post radioactive iodine thyroid ablation 08/17/2009   Hyperlipidemia 07/26/2008   Essential hypertension 07/26/2008   Pain in joint of left shoulder 07/26/2008   Goiter 07/14/2007    PCP: [name], MD  REFERRING PROVIDER: Jean Rosenthal, MD  REFERRING DIAG: [phone] (ICD-10-CM) - Status post total left knee replacement   THERAPY DIAG:  Acute pain of left knee  Stiffness of left knee, not elsewhere classified  Muscle weakness (generalized)  Localized edema  Other abnormalities of gait and mobility  Rationale for Evaluation and Treatment: Rehabilitation  ONSET DATE: 11/22/2022 TKA sg  SUBJECTIVE:  SUBJECTIVE STATEMENT:    she saw Sports Medicine MD yesterday who recommended no trunk ext with exercises.      PERTINENT HISTORY: OA, lumbar radiculopathy / LBP, osteopenia, obesity, s/p radioactive thyroid ablation, HTN  PAIN:  NPRS scale:  today 1/10 with range last week 0-3/10 Pain location: Left knee more medial Pain description: sore Aggravating factors: sitting, increasing distance suddenly Relieving factors: laying down with knee ext,  ice & Aleve/tylenol   PRECAUTIONS: None  WEIGHT BEARING RESTRICTIONS: Yes WBAT LLE  FALLS:  Has patient fallen in last 6 months? No  LIVING ENVIRONMENT: Lives with: lives with their spouse Lives in: House single level Stairs: Yes: External: 3 steps; on left going up Has following equipment at home: Single point cane, Environmental consultant - 2 wheeled, Wheelchair (manual), and Grab bars  OCCUPATION: retired from Retail buyer at [employer],   [place]: Independent  PATIENT GOALS:   improve knees to enable to enjoy retirement including travel  Next MD visit:  01/02/2022   OBJECTIVE:   DIAGNOSTIC FINDINGS: 11/23/2023 Post LEFT total knee arthroplasty without acute complication.   PATIENT  SURVEYS:  01/29/2023:  FOTO 63% 12/30/2022: FOTO 58% 12/06/2022:  FOTO intake: 38%   predicted:  60%  SENSATION: 12/06/2022: WFL  EDEMA:  12/06/2022:  Circumferential:  LLE above knee 50.7cm  around knee 47cm  below knee 40cm RLE above knee 46.7cm  around knee 43.6cm  below knee 36 cm   POSTURE: flexed trunk  and weight shift right  PALPATION: Tenderness medial knee (quad, joint line & proximal tibia), lateral joint line & incision.   LOWER EXTREMITY ROM:   ROM P:passive  A:active Left Eval 12/06/22 Left 12/10/12 Left 12/16/22 Left 12/17/22 Left 12/23/22 Left 12/30/22 Left 01/10/23 Left 01/13/23 Left 01/23/23 Left 02/05/23  Knee flexion Seated A: 83* P: 94* Supine  Supine A: 90 P: 95 Supine A: 94 P: 98 Supine A: 96 P: 100  Supine A: 100* P: 102* Seated P: 110* A: 104* Supine A: 111 Seated P: 118* A: 112* Seated P: 120* A: 113* Seated P: 121* A: 115*  Knee extension Seated  A: LAQ -13* Supine P: -8* A: -10* Supine A: -8 P: -4 Supine A: -8 P: -4 Supine A: -5 P: -3 Supine A: -3 P: -2 Seated LAQ -6* Standing  A: 0*  Seated A: LAQ -3* Standing  A: 0* Seated A: LAQ 0* Seated A: LAQ 0*   (Blank rows = not tested)  LOWER EXTREMITY MMT:  MMT Left Eval 12/06/22 Right 01/13/23 Left 01/13/23 Left 01/29/23 Right  01/29/23  Knee flexion 3-/5      Knee extension 3-/5 31.9, 34.1 25.2, 21.8 LLE:RLE 71% 39# & 34.6# LLE:RLE 90% 39.6# & 42.4#   (Blank rows = not tested)   FUNCTIONAL TESTS:  01/29/2023: sit to/from stand from 18" chair without UE assist independently   12/06/2022 / Eval: 18 inch chair transfer: requires use of BUEs on armrests, weight shift to RLE, limited knee flex  GAIT: 12/06/2022 / Eval: Distance walked: 100' Assistive device utilized: Single point cane Level of assistance: SBA Comments: LLE stiff leg with flexion in stance & minimal increase knee flex for swing, antalgic with LLE decreased stance time, LLE abducted, trunk  flexed   TODAY'S TREATMENT                                                                          DATE:  02/05/2023: TherEx:   Leg press 131# BLEs 15 reps; marching for SLS knee terminal ext 106# 5 reps 3 sets;  LLE only 75# 10 reps. Knee ext machine  BLEs 25# 15 reps; concentric BLEs & isometric / eccentric LLE only 15# 10 reps;  LLE  only concentric & eccentric 10# 10 reps Knee flex machine BLEs 35# 15 reps; single LE 15# 10 reps 2 sets ea LE PT instructed in seated row 25# 10 reps & chest press 15# 10 reps to engage core some. Perform bw knee machines as rest for LE muscles.  Pt verbalized understanding.   Treadmill initially 1.44mh & progressed to 2.0839m within 1 min and amb 4 min at 2.39m6m  PT demo feel of too fast & too slow.  Over time (weeks) she can increase comfortable speed.    02/03/2023: TherEx:   Treadmill 1.3 mph for 1 min warm-up, increase 0.1mp58mvery 30 sec until 2.39mph74mr last 1.5 min.  Total 6 min. Nustep recumbent stepper seat 7 1 min, seat 6 1 min, seat 5 3 min.  Using machine to increase flexion. Pt verbalized understanding. PT [facility] forward to flex knee warm-up, then use both recumbent bike & treadmill for 20-30 min total. Pt verbalized understanding.  PT educated on wearing old shoes with old wear pattern & time used for compression loss.  Pt verbalized understanding.  Squat at sink over chair for knee flexion.  PT verbal cues on technique. 15 reps. Leg press 125# BLEs 15 reps; marching with SLS knee terminal ext 100# 5 reps 3 sets;  LLE only 75# 10 reps Step up on BOSU round side up with blue theraband terminal knee ext  BUE support 15 reps 2 sets.   Neuromuscular Re-ed: Slider 5 directions (across midline anteriorly, ant-lat, lat, post-lat & across midline posteriorly) LLE  stance knee flexion on outward reach and ext on inward motion. 10 reps  01/29/2023: TherEx:   Precor recumbent bike rocking for 2 min as warm-up and  level 1 for 2 min, then level 3 for 4 min Standing Left knee flexion stretch with foot in 18" chair & 2nd chair back for safety 15 sec hold moving RLE stance closer for increased stretch 10 reps.  PT reviewed exercise program to include flexibility, strength, balance & endurance components. Pt verbalized understanding. Per pt request, PT instructed in treadmill use including safety. Pt amb 5 min progressing speed from 1.3 to 2.39mph.31mes to increase stride length with increased speed. Recommendation to warm up at 1.3 and progress up to allow muscles to stretch. Pt verbalized understanding.   Neuromuscular Re-ed: Y-reach stance LLE 5 reps with BUE support & 5 reps without UE support - stance knee flexion on outward reach and ext on inward motion. Rolling soccer ball around stance LE for BLEs 3 reps; then ipsilateral UE support 3 reps ea LE;     PATIENT EDUCATION:  Education details: HEP, POC Person educated: Patient & husband Education method: Explanation, Demonstration, Verbal cues, and Handouts Education comprehension: verbalized understanding, returned demonstration, and verbal cues required  HOME EXERCISE PROGRAM: Access Code: [email or web address] Date: 01/27/2023 Prepared by: Gabriel Conry Jamey Reascises - Ankle Alphabet in Elevation  - 2-4 x daily - 7 x weekly - 1 sets - 1 reps - Quad Setting and Stretching  - 2-4 x daily - 7 x weekly - 5-10 sets - 10 reps - prop 5-10 minutes & quad set5 seconds hold - Supine Heel Slide with Strap  - 2-3 x daily - 7 x weekly - 2-3 sets - 10 reps - 5 seconds hold - Supine Knee Extension Strengthening  - 2-3 x daily - 7 x weekly - 2-3 sets - 10 reps - 5 seconds hold - Supine Straight  Leg Raises  - 2-3 x daily - 7 x weekly - 2-3 sets - 10 reps - 5 seconds hold - Seated Knee Flexion Extension AROM   - 2-4 x daily - 7 x weekly - 2-3 sets - 10 reps - 5 seconds hold - Seated Long Arc Quad  - 2-3 x daily - 7 x weekly - 2-3 sets -  10 reps - 5 seconds hold - Seated straight leg lifts  - 2-3 x daily - 7 x weekly - 2-3 sets - 10 reps - 5 seconds hold - Seated Hamstring Stretch with Strap  - 2-4 x daily - 7 x weekly - 1 sets - 3 reps - 20-30 seconds hold - Standing Eccentric Heel Raise  - 2 x daily - 7 x weekly - 1 sets - 20 reps - 5 seconds hold - Standing Soleus Stretch on Step with Counter Support  - 2 x daily - 7 x weekly - 1 sets - 3 reps - 20-30 seconds hold - Squat with Counter Support  - 2 x daily - 7 x weekly - 2 sets - 10 reps - 5 seconds hold - Seated Hamstring Curl with Anchored Resistance  - 1 x daily - 7 x weekly - 2-3 sets - 10 reps - 5 seconds hold - Sitting Knee Extension with Resistance  - 1 x daily - 7 x weekly - 2-3 sets - 10 reps - 5 seconds hold - Standing Terminal Knee Extension with Resistance  - 1 x daily - 7 x weekly - 2-3 sets - 10 reps - 5 seconds hold - Gastroc Stretch on Step  - 2 x daily - 7 x weekly - 1 sets - 3 reps - 20-30 seconds hold - Standing Quad Stretch with Strap  - 1 x daily - 7 x weekly - 1 sets - 3 reps - 30 seconds hold - Standing Hamstring Stretch with Step  - 1 x daily - 7 x weekly - 1 sets - 3 reps - 30 seconds hold  ASSESSMENT: CLINICAL IMPRESSION: Patient appears to understand use of weight machines for [facility] to work on strength and cardio for endurance.  She continues to benefit from skilled PT.   OBJECTIVE IMPAIRMENTS: Abnormal gait, decreased activity tolerance, decreased balance, decreased endurance, decreased knowledge of condition, decreased knowledge of use of DME, decreased mobility, difficulty walking, decreased ROM, decreased strength, increased edema, increased muscle spasms, impaired flexibility, obesity, and pain.   ACTIVITY LIMITATIONS: carrying, lifting, bending, sitting, standing, squatting, sleeping, stairs, transfers, and locomotion level  PARTICIPATION LIMITATIONS: meal prep, cleaning, laundry, driving, and community activity  PERSONAL FACTORS: Fitness and  1-2 comorbidities: see PMH  are also affecting patient's functional outcome.   REHAB POTENTIAL: Good  CLINICAL DECISION MAKING: Stable/uncomplicated  EVALUATION COMPLEXITY: Low   GOALS: Goals reviewed with patient? Yes  SHORT TERM GOALS: (target date for Short term goals are 4 weeks 01/03/2023)   1.  Patient will demonstrate independent use of home exercise program to maintain progress from in clinic treatments.  Goal status: MET 12/30/22  LONG TERM GOALS: (target dates for all long term goals are 10 weeks  02/14/2023 )   1. Patient will demonstrate/report pain at worst less than or equal to 2/10 to facilitate minimal limitation in daily activity secondary to pain symptoms.  Goal status: New   2. Patient will demonstrate independent use of home exercise program to facilitate ability to maintain/progress functional gains from skilled physical therapy services.  Goal status: New  3. Patient will demonstrate FOTO outcome > or = 60 % to indicate reduced disability due to condition.  Goal status: New   4.  Patient will demonstrate LE MMT 5/5 throughout to faciltiate usual transfers, stairs, squatting at [facility] for daily life.   Goal status: New   5.  Patient ambulates >500' & negotiates ramps, curbs & stairs single rail without assistive device independently. Goal status: New     PLAN:  PT FREQUENCY: 2-3x/week  PT DURATION: 10 weeks  PLANNED INTERVENTIONS: Therapeutic exercises, Therapeutic activity, Neuromuscular re-education, Balance training, Gait training, Patient/Family education, Self Care, Joint mobilization, Stair training, DME instructions, Electrical stimulation, Cryotherapy, Moist heat, scar mobilization, Taping, Vasopneumatic device, Ultrasound, Ionotophoresis '4mg'$ /ml Dexamethasone, and Manual therapy  PLAN FOR NEXT SESSION:   anticipate discharge next week,  clarify any outlying issues and ensure understanding of ongoing fitness plan.    Jamey Reas, PT,  DPT 02/05/2023, 12:35 PM

## 2023-02-06 ENCOUNTER — Other Ambulatory Visit (HOSPITAL_COMMUNITY): Payer: Self-pay

## 2023-02-10 ENCOUNTER — Ambulatory Visit (INDEPENDENT_AMBULATORY_CARE_PROVIDER_SITE_OTHER): Payer: Medicare Other | Admitting: Physical Therapy

## 2023-02-10 ENCOUNTER — Encounter: Payer: Self-pay | Admitting: Physical Therapy

## 2023-02-10 DIAGNOSIS — M25662 Stiffness of left knee, not elsewhere classified: Secondary | ICD-10-CM

## 2023-02-10 DIAGNOSIS — M6281 Muscle weakness (generalized): Secondary | ICD-10-CM | POA: Diagnosis not present

## 2023-02-10 DIAGNOSIS — R2689 Other abnormalities of gait and mobility: Secondary | ICD-10-CM | POA: Diagnosis not present

## 2023-02-10 DIAGNOSIS — M25562 Pain in left knee: Secondary | ICD-10-CM | POA: Diagnosis not present

## 2023-02-10 DIAGNOSIS — R600 Localized edema: Secondary | ICD-10-CM

## 2023-02-10 NOTE — Therapy (Signed)
OUTPATIENT PHYSICAL THERAPY TREATMENT NOTE   Patient Name: Joan [name] MRN: [id number] DOB:1956-07-17, 67 y.o., female Today's Date: 02/10/2023   END OF SESSION:  PT End of Session - 02/10/23 1148     Visit Number 22    Number of Visits 25    Date for PT Re-Evaluation 02/14/23    Authorization Type Medicare & [employer] sup    PT Start Time 1145    PT Stop Time 1226    PT Time Calculation (min) 41 min    Equipment Utilized During Treatment Gait belt    Activity Tolerance Patient tolerated treatment well;Patient limited by pain    Behavior During Therapy WFL for tasks assessed/performed                       Past Medical History:  Diagnosis Date   Allergy    Anemia    due to Dysmenorrhea   Arthritis    generalized    Hypertension    initially after BCP initiated    Hypothyroidism    PONV (postoperative nausea and vomiting)    Thyroid disease 2005 & 2009   nodule   Past Surgical History:  Procedure Laterality Date   ABDOMINAL HYSTERECTOMY  1998   for heavy menses and fibroids   BIOPSY THYROID  2005,2009   both negative   CESAREAN SECTION     x2   COLONOSCOPY  2009    Dr [facility]   EXPLORATORY LAPAROTOMY  1985   Ovarian cyst   [place]    LLQ pain due to ovarian cyst   SIGMOIDOSCOPY     TONSILLECTOMY     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   Procedure: LEFT TOTAL KNEE ARTHROPLASTY;  Surgeon: Mcarthur Rossetti, MD;  Location: [facility];  Service: Orthopedics;  Laterality: Left;   Patient Active Problem List   Diagnosis Date Noted   Status post total left knee replacement 11/22/2022   Unilateral primary osteoarthritis, left knee 09/25/2022   Degenerative arthritis of left knee 08/20/2021   Degenerative spondylolisthesis 03/31/2020   Lumbar radiculopathy 03/28/2020   Mid back pain on right side 09/28/2018   Osteopenia 08/19/2018   Hyperglycemia 07/09/2018   Hypothyroidism 06/30/2017   Gallbladder  polyp 05/20/2016   Fatty liver 05/20/2016   Obesity 05/01/2016   Vitamin D deficiency 07/14/2013   LOW BACK PAIN SYNDROME 01/23/2010   Status post radioactive iodine thyroid ablation 08/17/2009   Hyperlipidemia 07/26/2008   Essential hypertension 07/26/2008   Pain in joint of left shoulder 07/26/2008   Goiter 07/14/2007    PCP: [name], MD  REFERRING PROVIDER: Jean Rosenthal, MD  REFERRING DIAG: [phone] (ICD-10-CM) - Status post total left knee replacement   THERAPY DIAG:  Acute pain of left knee  Stiffness of left knee, not elsewhere classified  Muscle weakness (generalized)  Localized edema  Other abnormalities of gait and mobility  Rationale for Evaluation and Treatment: Rehabilitation  ONSET DATE: 11/22/2022 TKA sg  SUBJECTIVE:  SUBJECTIVE STATEMENT:    she went to [facility] for chair Yoga class and recumbent stepper, bike & treadmill with no issues. She did not do weights because machines were busy.       PERTINENT HISTORY: OA, lumbar radiculopathy / LBP, osteopenia, obesity, s/p radioactive thyroid ablation, HTN  PAIN:  NPRS scale:  today 0/10 with range last week 0-2/10 Pain location: Left knee more medial Pain description: sore Aggravating factors: sitting, increasing distance suddenly Relieving  factors: laying down with knee ext,  ice & Aleve/tylenol   PRECAUTIONS: None  WEIGHT BEARING RESTRICTIONS: Yes WBAT LLE  FALLS:  Has patient fallen in last 6 months? No  LIVING ENVIRONMENT: Lives with: lives with their spouse Lives in: House single level Stairs: Yes: External: 3 steps; on left going up Has following equipment at home: Single point cane, Environmental consultant - 2 wheeled, Wheelchair (manual), and Grab bars  OCCUPATION: retired from Retail buyer at [employer],   [place]: Independent  PATIENT GOALS:   improve knees to enable to enjoy retirement including travel  Next MD visit:  01/02/2022   OBJECTIVE:   DIAGNOSTIC FINDINGS: 11/23/2023 Post LEFT total  knee arthroplasty without acute complication.   PATIENT SURVEYS:  01/29/2023:  FOTO 63% 12/30/2022: FOTO 58% 12/06/2022:  FOTO intake: 38%   predicted:  60%  SENSATION: 12/06/2022: WFL  EDEMA:  12/06/2022:  Circumferential:  LLE above knee 50.7cm  around knee 47cm  below knee 40cm RLE above knee 46.7cm  around knee 43.6cm  below knee 36 cm   POSTURE: flexed trunk  and weight shift right  PALPATION: Tenderness medial knee (quad, joint line & proximal tibia), lateral joint line & incision.   LOWER EXTREMITY ROM:   ROM P:passive  A:active Left Eval 12/06/22 Left 12/10/12 Left 12/16/22 Left 12/17/22 Left 12/23/22 Left 12/30/22 Left 01/10/23 Left 01/13/23 Left 01/23/23 Left 02/05/23  Knee flexion Seated A: 83* P: 94* Supine  Supine A: 90 P: 95 Supine A: 94 P: 98 Supine A: 96 P: 100  Supine A: 100* P: 102* Seated P: 110* A: 104* Supine A: 111 Seated P: 118* A: 112* Seated P: 120* A: 113* Seated P: 121* A: 115*  Knee extension Seated  A: LAQ -13* Supine P: -8* A: -10* Supine A: -8 P: -4 Supine A: -8 P: -4 Supine A: -5 P: -3 Supine A: -3 P: -2 Seated LAQ -6* Standing  A: 0*  Seated A: LAQ -3* Standing  A: 0* Seated A: LAQ 0* Seated A: LAQ 0*   (Blank rows = not tested)  LOWER EXTREMITY MMT:  MMT Left Eval 12/06/22 Right 01/13/23 Left 01/13/23 Left 01/29/23 Right  01/29/23  Knee flexion 3-/5      Knee extension 3-/5 31.9, 34.1 25.2, 21.8 LLE:RLE 71% 39# & 34.6# LLE:RLE 90% 39.6# & 42.4#   (Blank rows = not tested)   FUNCTIONAL TESTS:  01/29/2023: sit to/from stand from 18" chair without UE assist independently   12/06/2022 / Eval: 18 inch chair transfer: requires use of BUEs on armrests, weight shift to RLE, limited knee flex  GAIT: 12/06/2022 / Eval: Distance walked: 100' Assistive device utilized: Single point cane Level of assistance: SBA Comments: LLE stiff leg with flexion in stance & minimal increase knee flex for swing, antalgic with LLE  decreased stance time, LLE abducted, trunk flexed   TODAY'S TREATMENT                                                                          DATE:  02/10/2023: TherEx:   Recumbent bike full revolutions seat 6 level 3 for 8 min.  Leg press 137# BLEs 15 reps; marching for SLS knee terminal ext 106# 5 reps 3 sets;  LLE only 75# 10 reps. Knee ext machine  BLEs 25# 15 reps; concentric BLEs & isometric / eccentric LLE only 15# 10 reps;  LLE only concentric & eccentric 10# 10 reps Knee flex machine BLEs 35# 15 reps; single LE 15# 10 reps 2 sets ea LE Pt verbalized understanding of use of weight machines for [employer].    02/05/2023: TherEx:   Leg press 131# BLEs 15 reps; marching for SLS knee terminal ext 106# 5 reps 3 sets;  LLE only 75# 10 reps. Knee ext machine  BLEs 25# 15 reps; concentric BLEs & isometric / eccentric LLE only 15# 10 reps;  LLE only concentric & eccentric 10# 10 reps Knee flex machine BLEs 35# 15 reps; single LE 15# 10 reps 2 sets ea LE PT instructed in seated row 25# 10 reps & chest press 15# 10 reps to engage core some. Perform bw knee machines as rest for LE muscles.  Pt verbalized understanding.   Treadmill initially 1.35mph & progressed to 2.32mph within 1 min and amb 4 min at 2.29mph.  PT demo feel of too fast & too slow.  Over time (weeks) she can increase comfortable speed.    02/03/2023: TherEx:   Treadmill 1.3 mph for 1 min warm-up, increase 0.60mph every 30 sec until 2.68mph for last 1.5 min.  Total 6 min. Nustep recumbent stepper seat 7 1 min, seat 6 1 min, seat 5 3 min.  Using machine to increase flexion. Pt verbalized understanding. PT recommending using stepper at [facility] moving seat forward to flex knee warm-up, then use both recumbent bike & treadmill for 20-30 min total. Pt verbalized understanding.  PT educated on wearing old shoes with old wear pattern & time used for compression loss.  Pt verbalized understanding.  Squat at sink over chair for knee flexion.  PT verbal  cues on technique. 15 reps. Leg press 125# BLEs 15 reps; marching with SLS knee terminal ext 100# 5 reps 3 sets;  LLE only 75# 10 reps Step up on BOSU round side up with blue theraband terminal knee ext  BUE support 15 reps 2 sets.   Neuromuscular Re-ed: Slider 5 directions (across midline anteriorly, ant-lat, lat, post-lat & across midline posteriorly) LLE  stance knee flexion on outward reach and ext on inward motion. 10 reps    PATIENT EDUCATION:  Education details: HEP, POC Person educated: Patient & husband Education method: Explanation, Demonstration, Verbal cues, and Handouts Education comprehension: verbalized understanding, returned demonstration, and verbal cues required  HOME EXERCISE PROGRAM: Access Code: [facility] URL: [email or web address] Date: 01/27/2023 Prepared by: Jamey Reas  Exercises - Ankle Alphabet in Elevation  - 2-4 x daily - 7 x weekly - 1 sets - 1 reps - Quad Setting and Stretching  - 2-4 x daily - 7 x weekly - 5-10 sets - 10 reps - prop 5-10 minutes & quad set5 seconds hold - Supine Heel Slide with Strap  - 2-3 x daily - 7 x weekly - 2-3 sets - 10 reps - 5 seconds hold - Supine Knee Extension Strengthening  - 2-3 x daily - 7 x weekly - 2-3 sets - 10 reps - 5 seconds hold - Supine Straight Leg Raises  - 2-3 x daily - 7 x weekly - 2-3 sets - 10 reps - 5 seconds hold - Seated Knee Flexion Extension AROM   - 2-4 x daily - 7 x weekly - 2-3 sets - 10 reps - 5 seconds hold - Seated Long Arc Quad  -  2-3 x daily - 7 x weekly - 2-3 sets - 10 reps - 5 seconds hold - Seated straight leg lifts  - 2-3 x daily - 7 x weekly - 2-3 sets - 10 reps - 5 seconds hold - Seated Hamstring Stretch with Strap  - 2-4 x daily - 7 x weekly - 1 sets - 3 reps - 20-30 seconds hold - Standing Eccentric Heel Raise  - 2 x daily - 7 x weekly - 1 sets - 20 reps - 5 seconds hold - Standing Soleus Stretch on Step with Counter Support  - 2 x daily - 7 x weekly - 1 sets - 3 reps -  20-30 seconds hold - Squat with Counter Support  - 2 x daily - 7 x weekly - 2 sets - 10 reps - 5 seconds hold - Seated Hamstring Curl with Anchored Resistance  - 1 x daily - 7 x weekly - 2-3 sets - 10 reps - 5 seconds hold - Sitting Knee Extension with Resistance  - 1 x daily - 7 x weekly - 2-3 sets - 10 reps - 5 seconds hold - Standing Terminal Knee Extension with Resistance  - 1 x daily - 7 x weekly - 2-3 sets - 10 reps - 5 seconds hold - Gastroc Stretch on Step  - 2 x daily - 7 x weekly - 1 sets - 3 reps - 20-30 seconds hold - Standing Quad Stretch with Strap  - 1 x daily - 7 x weekly - 1 sets - 3 reps - 30 seconds hold - Standing Hamstring Stretch with Step  - 1 x daily - 7 x weekly - 1 sets - 3 reps - 30 seconds hold  ASSESSMENT: CLINICAL IMPRESSION:  patient appears to understand ongoing HEP including fitness plan at [facility].   She continues to benefit from skilled PT.    OBJECTIVE IMPAIRMENTS: Abnormal gait, decreased activity tolerance, decreased balance, decreased endurance, decreased knowledge of condition, decreased knowledge of use of DME, decreased mobility, difficulty walking, decreased ROM, decreased strength, increased edema, increased muscle spasms, impaired flexibility, obesity, and pain.   ACTIVITY LIMITATIONS: carrying, lifting, bending, sitting, standing, squatting, sleeping, stairs, transfers, and locomotion level  PARTICIPATION LIMITATIONS: meal prep, cleaning, laundry, driving, and community activity  PERSONAL FACTORS: Fitness and 1-2 comorbidities: see PMH  are also affecting patient's functional outcome.   REHAB POTENTIAL: Good  CLINICAL DECISION MAKING: Stable/uncomplicated  EVALUATION COMPLEXITY: Low   GOALS: Goals reviewed with patient? Yes  SHORT TERM GOALS: (target date for Short term goals are 4 weeks 01/03/2023)   1.  Patient will demonstrate independent use of home exercise program to maintain progress from in clinic treatments.  Goal status: MET  12/30/22  LONG TERM GOALS: (target dates for all long term goals are 10 weeks  02/14/2023 )   1. Patient will demonstrate/report pain at worst less than or equal to 2/10 to facilitate minimal limitation in daily activity secondary to pain symptoms.  Goal status: MET 02/10/2023   2. Patient will demonstrate independent use of home exercise program to facilitate ability to maintain/progress functional gains from skilled physical therapy services.  Goal status: MET 02/10/2023   3. Patient will demonstrate FOTO outcome > or = 60 % to indicate reduced disability due to condition.  Goal status: MET 01/29/2023   4.  Patient will demonstrate LE MMT 5/5 throughout to faciltiate usual transfers, stairs, squatting at [facility] for daily life.   Goal status: ongoing   5.  Patient  ambulates >500' & negotiates ramps, curbs & stairs single rail without assistive device independently. Goal status: ongoing     PLAN:  PT FREQUENCY: 2-3x/week  PT DURATION: 10 weeks  PLANNED INTERVENTIONS: Therapeutic exercises, Therapeutic activity, Neuromuscular re-education, Balance training, Gait training, Patient/Family education, Self Care, Joint mobilization, Stair training, DME instructions, Electrical stimulation, Cryotherapy, Moist heat, scar mobilization, Taping, Vasopneumatic device, Ultrasound, Ionotophoresis 4mg /ml Dexamethasone, and Manual therapy  PLAN FOR NEXT SESSION:  discharge PT, check LTGs   Jamey Reas, PT, DPT 02/10/2023, 12:31 PM

## 2023-02-12 ENCOUNTER — Encounter: Payer: Self-pay | Admitting: Physical Therapy

## 2023-02-12 ENCOUNTER — Ambulatory Visit (INDEPENDENT_AMBULATORY_CARE_PROVIDER_SITE_OTHER): Payer: Medicare Other | Admitting: Physical Therapy

## 2023-02-12 DIAGNOSIS — M25662 Stiffness of left knee, not elsewhere classified: Secondary | ICD-10-CM | POA: Diagnosis not present

## 2023-02-12 DIAGNOSIS — M6281 Muscle weakness (generalized): Secondary | ICD-10-CM | POA: Diagnosis not present

## 2023-02-12 DIAGNOSIS — M25562 Pain in left knee: Secondary | ICD-10-CM

## 2023-02-12 DIAGNOSIS — R2689 Other abnormalities of gait and mobility: Secondary | ICD-10-CM

## 2023-02-12 DIAGNOSIS — R600 Localized edema: Secondary | ICD-10-CM

## 2023-02-12 NOTE — Therapy (Signed)
OUTPATIENT PHYSICAL THERAPY TREATMENT NOTE & DISCHARGE SUMMARY   Patient Name: Joan [name] MRN: [id number] DOB:[birth year], 67 y.o., female Today's Date: 02/12/2023 PHYSICAL THERAPY DISCHARGE SUMMARY  Visits from Start of Care: 22  Current functional level related to goals / functional outcomes: See below   Remaining deficits: See below   Education / Equipment: Pt was educated in [employer] & gym fitness program.    Patient agrees to discharge. Patient goals were met. Patient is being discharged due to meeting the stated rehab goals.   END OF SESSION:  PT End of Session - 02/12/23 1150     Visit Number 23    Number of Visits 25    Date for PT Re-Evaluation 02/14/23    Authorization Type Medicare & [employer] sup    PT Start Time 1147    PT Stop Time 1214    PT Time Calculation (min) 27 min    Equipment Utilized During Treatment Gait belt    Activity Tolerance Patient tolerated treatment well;Patient limited by pain    Behavior During Therapy WFL for tasks assessed/performed                       Past Medical History:  Diagnosis Date   Allergy    Anemia    due to Dysmenorrhea   Arthritis    generalized    Hypertension    initially after BCP initiated    Hypothyroidism    PONV (postoperative nausea and vomiting)    Thyroid disease 2005 & 2009   nodule   Past Surgical History:  Procedure Laterality Date   ABDOMINAL HYSTERECTOMY  1998   for heavy menses and fibroids   BIOPSY THYROID  2005,2009   both negative   CESAREAN SECTION     x2   COLONOSCOPY  2009    Dr [facility]   EXPLORATORY LAPAROTOMY  1985   Ovarian cyst   [place]    LLQ pain due to ovarian cyst   SIGMOIDOSCOPY     TONSILLECTOMY     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   Procedure: LEFT TOTAL KNEE ARTHROPLASTY;  Surgeon: Mcarthur Rossetti, MD;  Location: [facility];  Service: Orthopedics;  Laterality: Left;   Patient Active Problem List    Diagnosis Date Noted   Status post total left knee replacement 11/22/2022   Unilateral primary osteoarthritis, left knee 09/25/2022   Degenerative arthritis of left knee 08/20/2021   Degenerative spondylolisthesis 03/31/2020   Lumbar radiculopathy 03/28/2020   Mid back pain on right side 09/28/2018   Osteopenia 08/19/2018   Hyperglycemia 07/09/2018   Hypothyroidism 06/30/2017   Gallbladder polyp 05/20/2016   Fatty liver 05/20/2016   Obesity 05/01/2016   Vitamin D deficiency 07/14/2013   LOW BACK PAIN SYNDROME 01/23/2010   Status post radioactive iodine thyroid ablation 08/17/2009   Hyperlipidemia 07/26/2008   Essential hypertension 07/26/2008   Pain in joint of left shoulder 07/26/2008   Goiter 07/14/2007    PCP: [name], MD  REFERRING PROVIDER: Jean Rosenthal, MD  REFERRING DIAG: [phone] (ICD-10-CM) - Status post total left knee replacement   THERAPY DIAG:  Acute pain of left knee  Stiffness of left knee, not elsewhere classified  Muscle weakness (generalized)  Other abnormalities of gait and mobility  Localized edema  Rationale for Evaluation and Treatment: Rehabilitation  ONSET DATE: 11/22/2022 TKA sg  SUBJECTIVE:  SUBJECTIVE STATEMENT:    She is comfortable with ongoing HEP &  gym program.  She feels that she met her goals for having knee replaced & rehab.      PERTINENT HISTORY: OA, lumbar radiculopathy / LBP, osteopenia, obesity, s/p radioactive thyroid ablation, HTN  PAIN:  NPRS scale:  today 0/10 with range last week 0-2/10 Pain location: Left knee more medial Pain description: sore Aggravating factors: sitting, increasing distance suddenly Relieving factors: laying down with knee ext,  ice & Aleve/tylenol   PRECAUTIONS: None  WEIGHT BEARING RESTRICTIONS: Yes WBAT LLE  FALLS:  Has patient fallen in last 6 months? No  LIVING ENVIRONMENT: Lives with: lives with their spouse Lives in: House single level Stairs: Yes: External: 3 steps;  on left going up Has following equipment at home: Single point cane, Environmental consultant - 2 wheeled, Wheelchair (manual), and Grab bars  OCCUPATION: retired from Retail buyer at [employer],   [place]: Independent  PATIENT GOALS:   improve knees to enable to enjoy retirement including travel  Next MD visit:  01/02/2022   OBJECTIVE:   DIAGNOSTIC FINDINGS: 11/23/2023 Post LEFT total knee arthroplasty without acute complication.   PATIENT SURVEYS:  02/12/2023: FOTO 77% 01/29/2023:  FOTO 63% 12/30/2022: FOTO 58% 12/06/2022:  FOTO intake: 38%   predicted:  60%  SENSATION: 12/06/2022: WFL  EDEMA:  02/12/2023: Circumferential:   LLE above knee 52 cm  around knee 44 cm  below knee 38 cm RLE above knee 47.2 m  around knee 43.7 cm  below knee 38.5 cm  12/06/2022:  Circumferential:  LLE above knee 50.7cm  around knee 47cm  below knee 40cm RLE above knee 46.7cm  around knee 43.6cm  below knee 36 cm   POSTURE: flexed trunk  and weight shift right  PALPATION: Tenderness medial knee (quad, joint line & proximal tibia), lateral joint line & incision.   LOWER EXTREMITY ROM:   ROM P:passive  A:active Left Eval 12/06/22 Left 12/10/12 Left 12/16/22 Left 12/17/22 Left 12/23/22 Left 12/30/22 Left 01/10/23 Left 01/13/23 Left 01/23/23 Left 02/05/23 Left 02/12/23  Knee flexion Seated A: 83* P: 94* Supine  Supine A: 90 P: 95 Supine A: 94 P: 98 Supine A: 96 P: 100  Supine A: 100* P: 102* Seated P: 110* A: 104* Supine A: 111 Seated P: 118* A: 112* Seated P: 120* A: 113* Seated P: 121* A: 115* Seated A:115* P: 121*  Knee extension Seated  A: LAQ -13* Supine P: -8* A: -10* Supine A: -8 P: -4 Supine A: -8 P: -4 Supine A: -5 P: -3 Supine A: -3 P: -2 Seated LAQ -6* Standing  A: 0*  Seated A: LAQ -3* Standing  A: 0* Seated A: LAQ 0* Seated A: LAQ 0* Seated A: LAQ 0*   (Blank rows = not tested)  LOWER EXTREMITY MMT:  MMT Left Eval 12/06/22 Right 01/13/23 Left 01/13/23 Left 01/29/23  Right  01/29/23 Left 02/12/23  Knee flexion 3-/5     5/5  Knee extension 3-/5 31.9, 34.1 25.2, 21.8 LLE:RLE 71% 39# & 34.6# LLE:RLE 90% 39.6# & 42.4# 5/5 38# x 2 LLE:RLE 93%   (Blank rows = not tested)   FUNCTIONAL TESTS:  01/29/2023: sit to/from stand from 18" chair without UE assist independently   12/06/2022 / Eval: 18 inch chair transfer: requires use of BUEs on armrests, weight shift to RLE, limited knee flex  GAIT: 01/29/2023: Gait Velocity:  3.88 ft/sec comfortable & 5.29 ft/sec fast pace Pt amb >500' without device, neg ramps & curbs independently. Stairs alternating pattern with single rail.   12/06/2022 /  Eval: Distance walked: 100' Assistive device utilized: Single point cane Level of assistance: SBA Comments: LLE stiff leg with flexion in stance & minimal increase knee flex for swing, antalgic with LLE decreased stance time, LLE abducted, trunk flexed   TODAY'S TREATMENT                                                                          DATE:  02/12/2023: TherEx:   Recumbent bike full revolutions seat 6 level 3 for 8 min.  See objective data  02/10/2023: TherEx:   Recumbent bike full revolutions seat 6 level 3 for 8 min.  Leg press 137# BLEs 15 reps; marching for SLS knee terminal ext 106# 5 reps 3 sets;  LLE only 75# 10 reps. Knee ext machine  BLEs 25# 15 reps; concentric BLEs & isometric / eccentric LLE only 15# 10 reps;  LLE only concentric & eccentric 10# 10 reps Knee flex machine BLEs 35# 15 reps; single LE 15# 10 reps 2 sets ea LE Pt verbalized understanding of use of weight machines for [employer].    02/05/2023: TherEx:   Leg press 131# BLEs 15 reps; marching for SLS knee terminal ext 106# 5 reps 3 sets;  LLE only 75# 10 reps. Knee ext machine  BLEs 25# 15 reps; concentric BLEs & isometric / eccentric LLE only 15# 10 reps;  LLE only concentric & eccentric 10# 10 reps Knee flex machine BLEs 35# 15 reps; single LE 15# 10 reps 2 sets ea LE PT instructed in  seated row 25# 10 reps & chest press 15# 10 reps to engage core some. Perform bw knee machines as rest for LE muscles.  Pt verbalized understanding.   Treadmill initially 1.28mph & progressed to 2.77mph within 1 min and amb 4 min at 2.74mph.  PT demo feel of too fast & too slow.  Over time (weeks) she can increase comfortable speed.    02/03/2023: TherEx:   Treadmill 1.3 mph for 1 min warm-up, increase 0.63mph every 30 sec until 2.22mph for last 1.5 min.  Total 6 min. Nustep recumbent stepper seat 7 1 min, seat 6 1 min, seat 5 3 min.  Using machine to increase flexion. Pt verbalized understanding. PT recommending using stepper at [facility] moving seat forward to flex knee warm-up, then use both recumbent bike & treadmill for 20-30 min total. Pt verbalized understanding.  PT educated on wearing old shoes with old wear pattern & time used for compression loss.  Pt verbalized understanding.  Squat at sink over chair for knee flexion.  PT verbal cues on technique. 15 reps. Leg press 125# BLEs 15 reps; marching with SLS knee terminal ext 100# 5 reps 3 sets;  LLE only 75# 10 reps Step up on BOSU round side up with blue theraband terminal knee ext  BUE support 15 reps 2 sets.   Neuromuscular Re-ed: Slider 5 directions (across midline anteriorly, ant-lat, lat, post-lat & across midline posteriorly) LLE  stance knee flexion on outward reach and ext on inward motion. 10 reps    PATIENT EDUCATION:  Education details: HEP, POC Person educated: Patient & husband Education method: Explanation, Demonstration, Verbal cues, and Handouts Education comprehension: verbalized understanding, returned demonstration, and verbal cues required  HOME EXERCISE PROGRAM: Access Code: [facility] URL: [email or web address] Date: 01/27/2023 Prepared by: Jamey Reas  Exercises - Ankle Alphabet in Elevation  - 2-4 x daily - 7 x weekly - 1 sets - 1 reps - Quad Setting and Stretching  - 2-4 x daily - 7 x weekly -  5-10 sets - 10 reps - prop 5-10 minutes & quad set5 seconds hold - Supine Heel Slide with Strap  - 2-3 x daily - 7 x weekly - 2-3 sets - 10 reps - 5 seconds hold - Supine Knee Extension Strengthening  - 2-3 x daily - 7 x weekly - 2-3 sets - 10 reps - 5 seconds hold - Supine Straight Leg Raises  - 2-3 x daily - 7 x weekly - 2-3 sets - 10 reps - 5 seconds hold - Seated Knee Flexion Extension AROM   - 2-4 x daily - 7 x weekly - 2-3 sets - 10 reps - 5 seconds hold - Seated Long Arc Quad  - 2-3 x daily - 7 x weekly - 2-3 sets - 10 reps - 5 seconds hold - Seated straight leg lifts  - 2-3 x daily - 7 x weekly - 2-3 sets - 10 reps - 5 seconds hold - Seated Hamstring Stretch with Strap  - 2-4 x daily - 7 x weekly - 1 sets - 3 reps - 20-30 seconds hold - Standing Eccentric Heel Raise  - 2 x daily - 7 x weekly - 1 sets - 20 reps - 5 seconds hold - Standing Soleus Stretch on Step with Counter Support  - 2 x daily - 7 x weekly - 1 sets - 3 reps - 20-30 seconds hold - Squat with Counter Support  - 2 x daily - 7 x weekly - 2 sets - 10 reps - 5 seconds hold - Seated Hamstring Curl with Anchored Resistance  - 1 x daily - 7 x weekly - 2-3 sets - 10 reps - 5 seconds hold - Sitting Knee Extension with Resistance  - 1 x daily - 7 x weekly - 2-3 sets - 10 reps - 5 seconds hold - Standing Terminal Knee Extension with Resistance  - 1 x daily - 7 x weekly - 2-3 sets - 10 reps - 5 seconds hold - Gastroc Stretch on Step  - 2 x daily - 7 x weekly - 1 sets - 3 reps - 20-30 seconds hold - Standing Quad Stretch with Strap  - 1 x daily - 7 x weekly - 1 sets - 3 reps - 30 seconds hold - Standing Hamstring Stretch with Step  - 1 x daily - 7 x weekly - 1 sets - 3 reps - 30 seconds hold  ASSESSMENT: CLINICAL IMPRESSION:  patient met all LTGs.  She appears to be functioning well with her knee.  She seems to understand ongoing exercises including rationale for full recovery.   OBJECTIVE IMPAIRMENTS: Abnormal gait, decreased activity  tolerance, decreased balance, decreased endurance, decreased knowledge of condition, decreased knowledge of use of DME, decreased mobility, difficulty walking, decreased ROM, decreased strength, increased edema, increased muscle spasms, impaired flexibility, obesity, and pain.   ACTIVITY LIMITATIONS: carrying, lifting, bending, sitting, standing, squatting, sleeping, stairs, transfers, and locomotion level  PARTICIPATION LIMITATIONS: meal prep, cleaning, laundry, driving, and community activity  PERSONAL FACTORS: Fitness and 1-2 comorbidities: see PMH  are also affecting patient's functional outcome.   REHAB POTENTIAL: Good  CLINICAL DECISION MAKING: Stable/uncomplicated  EVALUATION COMPLEXITY: Low   GOALS: Goals reviewed  with patient? Yes  SHORT TERM GOALS: (target date for Short term goals are 4 weeks 01/03/2023)   1.  Patient will demonstrate independent use of home exercise program to maintain progress from in clinic treatments.  Goal status: MET 12/30/22  LONG TERM GOALS: (target dates for all long term goals are 10 weeks  02/14/2023 )   1. Patient will demonstrate/report pain at worst less than or equal to 2/10 to facilitate minimal limitation in daily activity secondary to pain symptoms.  Goal status: MET 02/10/2023   2. Patient will demonstrate independent use of home exercise program to facilitate ability to maintain/progress functional gains from skilled physical therapy services.  Goal status: MET 02/10/2023   3. Patient will demonstrate FOTO outcome > or = 60 % to indicate reduced disability due to condition.  Goal status: MET 01/29/2023   4.  Patient will demonstrate LE MMT 5/5 throughout to faciltiate usual transfers, stairs, squatting at [facility] for daily life.   Goal status: MET 02/12/2023   5.  Patient ambulates >500' & negotiates ramps, curbs & stairs single rail without assistive device independently. Goal status: MET 02/12/2023     PLAN:  PT FREQUENCY:  2-3x/week  PT DURATION: 10 weeks  PLANNED INTERVENTIONS: Therapeutic exercises, Therapeutic activity, Neuromuscular re-education, Balance training, Gait training, Patient/Family education, Self Care, Joint mobilization, Stair training, DME instructions, Electrical stimulation, Cryotherapy, Moist heat, scar mobilization, Taping, Vasopneumatic device, Ultrasound, Ionotophoresis 4mg /ml Dexamethasone, and Manual therapy  PLAN FOR NEXT SESSION:  discharge PT   Jamey Reas, PT, DPT 02/12/2023, 12:23 PM

## 2023-02-15 ENCOUNTER — Other Ambulatory Visit (HOSPITAL_COMMUNITY): Payer: Self-pay

## 2023-03-10 DIAGNOSIS — H2513 Age-related nuclear cataract, bilateral: Secondary | ICD-10-CM | POA: Diagnosis not present

## 2023-03-10 DIAGNOSIS — D3131 Benign neoplasm of right choroid: Secondary | ICD-10-CM | POA: Diagnosis not present

## 2023-03-10 DIAGNOSIS — H524 Presbyopia: Secondary | ICD-10-CM | POA: Diagnosis not present

## 2023-03-25 ENCOUNTER — Telehealth: Payer: Self-pay

## 2023-03-25 NOTE — Telephone Encounter (Signed)
Contacted Joan [name] to schedule their annual wellness visit. Appointment made for 04/10/23.

## 2023-04-07 ENCOUNTER — Other Ambulatory Visit: Payer: Self-pay

## 2023-04-09 ENCOUNTER — Encounter: Payer: Self-pay | Admitting: Internal Medicine

## 2023-04-09 NOTE — Patient Instructions (Addendum)
      Blood work was ordered.   The lab is on the first floor.    Medications changes include :   none   Ct calcium score test ordered.     Return in about 1 year (around 04/09/2024) for follow up.

## 2023-04-09 NOTE — Progress Notes (Signed)
Subjective:    Patient ID: Joan [name], female    DOB: 02-21-1956, 67 y.o.   MRN: [id number]     HPI [place] is here for follow up of her chronic medical problems.  Takes 2 aleve twice a day.  Exercising regularly - goes Y.     Medications and allergies reviewed with patient and updated if appropriate.  Current Outpatient Medications on File Prior to Visit  Medication Sig Dispense Refill   amLODipine-benazepril (LOTREL) 5-20 MG capsule Take 1 capsule by mouth daily. Keep scheduled appt for future refills 90 capsule 3   cetirizine (ZYRTEC) 10 MG tablet Take 10 mg by mouth at bedtime.     estradiol (VIVELLE-DOT) 0.05 MG/24HR patch APPLY 1 PATCH ONTO THE SKIN 2 TIMES WEEKLY 24 patch 4   Multiple Vitamin (MULTIVITAMIN WITH MINERALS) TABS tablet Take 1 tablet by mouth daily.     naproxen sodium (ALEVE) 220 MG tablet Take 220 mg by mouth 2 (two) times daily with a meal.     SYNTHROID 100 MCG tablet Take 1 tablet (100 mcg total) by mouth every morning on an empty stomach 90 tablet 4   TART CHERRY PO Take 1,200 mg by mouth daily.     triamterene-hydrochlorothiazide (DYAZIDE) 37.5-25 MG capsule Take 1 capsule total by mouth daily. 90 capsule 3   Vitamin D, Ergocalciferol, (DRISDOL) 1.25 MG (50000 UNIT) CAPS capsule Take 1 capsule by mouth once a week 12 capsule 4   No current facility-administered medications on file prior to visit.     Review of Systems  Constitutional:  Negative for fever.  HENT:  Positive for hearing loss. Negative for ear pain (right ear feels clogged).   Respiratory:  Negative for cough, shortness of breath and wheezing.   Cardiovascular:  Negative for chest pain, palpitations and leg swelling.  Gastrointestinal:  Negative for abdominal pain, constipation, diarrhea and nausea.  Musculoskeletal:  Positive for arthralgias and back pain.  Neurological:  Negative for light-headedness and headaches.       Objective:   Vitals:   04/10/23 0941 04/10/23  1032  BP: (!) 144/80 130/80  Pulse: 74   Temp: 98.2 F (36.8 C)   SpO2: 97%    BP Readings from Last 3 Encounters:  04/10/23 130/80  02/04/23 (!) 152/80  11/24/22 130/72   Wt Readings from Last 3 Encounters:  04/10/23 198 lb (89.8 kg)  02/04/23 197 lb (89.4 kg)  11/22/22 196 lb (88.9 kg)   Body mass index is 33.99 kg/m.    Physical Exam Constitutional:      General: She is not in acute distress.    Appearance: Normal appearance.  HENT:     Head: Normocephalic and atraumatic.     Right Ear: Tympanic membrane, ear canal and external ear normal. There is no impacted cerumen.     Left Ear: Tympanic membrane, ear canal and external ear normal. There is no impacted cerumen.  Eyes:     Conjunctiva/sclera: Conjunctivae normal.  Cardiovascular:     Rate and Rhythm: Normal rate and regular rhythm.     Heart sounds: Normal heart sounds.  Pulmonary:     Effort: Pulmonary effort is normal. No respiratory distress.     Breath sounds: Normal breath sounds. No wheezing.  Abdominal:     General: There is no distension.     Palpations: Abdomen is soft.     Tenderness: There is no abdominal tenderness. There is no guarding or rebound.  Musculoskeletal:     Cervical back: Neck supple.     Right lower leg: No edema.     Left lower leg: No edema.  Lymphadenopathy:     Cervical: No cervical adenopathy.  Skin:    General: Skin is warm and dry.     Findings: No rash.  Neurological:     Mental Status: She is alert. Mental status is at baseline.  Psychiatric:        Mood and Affect: Mood normal.        Behavior: Behavior normal.        Lab Results  Component Value Date   WBC 6.4 04/10/2023   HGB 14.1 04/10/2023   HCT 40.8 04/10/2023   PLT 246.0 04/10/2023   GLUCOSE 90 04/10/2023   CHOL 250 (H) 04/10/2023   TRIG 169.0 (H) 04/10/2023   HDL 52.80 04/10/2023   LDLDIRECT 183.0 07/27/2008   LDLCALC 164 (H) 04/10/2023   ALT 16 04/10/2023   AST 20 04/10/2023   NA 137 04/10/2023    K 3.6 04/10/2023   CL 99 04/10/2023   CREATININE 0.71 04/10/2023   BUN 17 04/10/2023   CO2 31 04/10/2023   TSH 2.04 04/10/2023   HGBA1C 5.3 04/10/2023   The 10-year ASCVD risk score (Arnett DK, et al., 2019) is: 12.7%   Values used to calculate the score:     Age: 1 years     Sex: Female     Is Non-Hispanic African American: No     Diabetic: No     Tobacco smoker: No     Systolic Blood Pressure: 130 mmHg     Is BP treated: Yes     HDL Cholesterol: 52.8 mg/dL     Total Cholesterol: 250 mg/dL   Assessment & Plan:    See Problem List for Assessment and Plan of chronic medical problems.

## 2023-04-10 ENCOUNTER — Other Ambulatory Visit (HOSPITAL_COMMUNITY): Payer: Self-pay

## 2023-04-10 ENCOUNTER — Ambulatory Visit (INDEPENDENT_AMBULATORY_CARE_PROVIDER_SITE_OTHER): Payer: Medicare Other | Admitting: Internal Medicine

## 2023-04-10 VITALS — BP 130/80 | HR 74 | Temp 98.2°F | Ht 64.0 in | Wt 198.0 lb

## 2023-04-10 DIAGNOSIS — R739 Hyperglycemia, unspecified: Secondary | ICD-10-CM | POA: Diagnosis not present

## 2023-04-10 DIAGNOSIS — Z136 Encounter for screening for cardiovascular disorders: Secondary | ICD-10-CM

## 2023-04-10 DIAGNOSIS — E890 Postprocedural hypothyroidism: Secondary | ICD-10-CM

## 2023-04-10 DIAGNOSIS — E559 Vitamin D deficiency, unspecified: Secondary | ICD-10-CM | POA: Diagnosis not present

## 2023-04-10 DIAGNOSIS — T753XXA Motion sickness, initial encounter: Secondary | ICD-10-CM | POA: Insufficient documentation

## 2023-04-10 DIAGNOSIS — E782 Mixed hyperlipidemia: Secondary | ICD-10-CM | POA: Diagnosis not present

## 2023-04-10 DIAGNOSIS — T753XXD Motion sickness, subsequent encounter: Secondary | ICD-10-CM

## 2023-04-10 DIAGNOSIS — M85852 Other specified disorders of bone density and structure, left thigh: Secondary | ICD-10-CM

## 2023-04-10 DIAGNOSIS — I10 Essential (primary) hypertension: Secondary | ICD-10-CM

## 2023-04-10 LAB — LIPID PANEL
Cholesterol: 250 mg/dL — ABNORMAL HIGH (ref 0–200)
HDL: 52.8 mg/dL (ref >39.00)
LDL Cholesterol: 164 mg/dL — ABNORMAL HIGH (ref 0–99)
NonHDL: 197.63
Total CHOL/HDL Ratio: 5
Triglycerides: 169 mg/dL — ABNORMAL HIGH (ref 0.0–149.0)
VLDL: 33.8 mg/dL (ref 0.0–40.0)

## 2023-04-10 LAB — CBC WITH DIFFERENTIAL/PLATELET
Basophils Absolute: 0.1 10*3/uL (ref 0.0–0.1)
Basophils Relative: 1 % (ref 0.0–3.0)
Eosinophils Absolute: 0.1 10*3/uL (ref 0.0–0.7)
Eosinophils Relative: 1.8 % (ref 0.0–5.0)
HCT: 40.8 % (ref 36.0–46.0)
Hemoglobin: 14.1 g/dL (ref 12.0–15.0)
Lymphocytes Relative: 26.7 % (ref 12.0–46.0)
Lymphs Abs: 1.7 10*3/uL (ref 0.7–4.0)
MCHC: 34.5 g/dL (ref 30.0–36.0)
MCV: 90.1 fl (ref 78.0–100.0)
Monocytes Absolute: 0.4 10*3/uL (ref 0.1–1.0)
Monocytes Relative: 6.1 % (ref 3.0–12.0)
Neutro Abs: 4.1 10*3/uL (ref 1.4–7.7)
Neutrophils Relative %: 64.4 % (ref 43.0–77.0)
Platelets: 246 10*3/uL (ref 150.0–400.0)
RBC: 4.53 Mil/uL (ref 3.87–5.11)
RDW: 14.4 % (ref 11.5–15.5)
WBC: 6.4 10*3/uL (ref 4.0–10.5)

## 2023-04-10 LAB — COMPREHENSIVE METABOLIC PANEL
ALT: 16 U/L (ref 0–35)
AST: 20 U/L (ref 0–37)
Albumin: 4.4 g/dL (ref 3.5–5.2)
Alkaline Phosphatase: 77 U/L (ref 39–117)
BUN: 17 mg/dL (ref 6–23)
CO2: 31 mEq/L (ref 19–32)
Calcium: 9.3 mg/dL (ref 8.4–10.5)
Chloride: 99 mEq/L (ref 96–112)
Creatinine, Ser: 0.71 mg/dL (ref 0.40–1.20)
GFR: 86.86 mL/min (ref >60.00)
Glucose, Bld: 90 mg/dL (ref 70–99)
Potassium: 3.6 mEq/L (ref 3.5–5.1)
Sodium: 137 mEq/L (ref 135–145)
Total Bilirubin: 0.4 mg/dL (ref 0.2–1.2)
Total Protein: 7.1 g/dL (ref 6.0–8.3)

## 2023-04-10 LAB — VITAMIN D 25 HYDROXY (VIT D DEFICIENCY, FRACTURES): VITD: 47.51 ng/mL (ref 30.00–100.00)

## 2023-04-10 LAB — HEMOGLOBIN A1C: Hgb A1c MFr Bld: 5.3 % (ref 4.6–6.5)

## 2023-04-10 LAB — TSH: TSH: 2.04 u[IU]/mL (ref 0.35–5.50)

## 2023-04-10 MED ORDER — SCOPOLAMINE 1 MG/3DAYS TD PT72
1.0000 | MEDICATED_PATCH | TRANSDERMAL | 0 refills | Status: DC
  Filled 2023-04-10: qty 4, 12d supply, fill #0

## 2023-04-10 NOTE — Assessment & Plan Note (Signed)
Chronic Check a1c Low sugar / carb diet Stressed regular exercise  

## 2023-04-10 NOTE — Assessment & Plan Note (Signed)
Chronic DEXA up-to-date Taking vitamin D regularly Taking calcium Stressed regular exercise

## 2023-04-10 NOTE — Assessment & Plan Note (Signed)
Chronic ?Managed by Dr. Ballin ?On Synthroid 100 mcg daily ?

## 2023-04-10 NOTE — Assessment & Plan Note (Signed)
Traveling his year  Gets motion sickness Scopolamine prescribed

## 2023-04-10 NOTE — Assessment & Plan Note (Signed)
Chronic Regular exercise and healthy diet encouraged Check lipid panel  Elevated lipids, elevated ASCVD risk Advised starting statin

## 2023-04-10 NOTE — Assessment & Plan Note (Signed)
Chronic Blood pressure well controlled CMP Continue amlodipine-benazepril 5-20 mg daily, triamterene-HCTZ 37.5-25 mg daily

## 2023-04-10 NOTE — Assessment & Plan Note (Signed)
Chronic Taking vitamin D daily Check vitamin D level  

## 2023-04-15 ENCOUNTER — Other Ambulatory Visit (HOSPITAL_COMMUNITY): Payer: Self-pay

## 2023-04-16 ENCOUNTER — Other Ambulatory Visit (HOSPITAL_COMMUNITY): Payer: Self-pay

## 2023-04-24 ENCOUNTER — Other Ambulatory Visit (HOSPITAL_COMMUNITY): Payer: Self-pay

## 2023-05-01 ENCOUNTER — Ambulatory Visit (INDEPENDENT_AMBULATORY_CARE_PROVIDER_SITE_OTHER): Payer: Medicare Other | Admitting: Orthopaedic Surgery

## 2023-05-01 ENCOUNTER — Encounter: Payer: Self-pay | Admitting: Orthopaedic Surgery

## 2023-05-01 ENCOUNTER — Other Ambulatory Visit (INDEPENDENT_AMBULATORY_CARE_PROVIDER_SITE_OTHER): Payer: Medicare Other

## 2023-05-01 DIAGNOSIS — Z96652 Presence of left artificial knee joint: Secondary | ICD-10-CM

## 2023-05-01 NOTE — Progress Notes (Signed)
The patient is a 67 year old active female who is now 6 months status post a left total knee arthroplasty.  She is still working on range of motion and strength she said but she is doing well overall.  Her range of motion is actually entirely full of her left knee.  It feels ligamentously stable and there is no effusion.  2 views left knee show a total knee arthroplasty with no gross findings or complicating features.  From my standpoint she will continue to increase her activities as comfort allows.  She says sometimes she feels like she is stumbling a little bit.  If that worsens she will let us know.  If not I will see her in 6 months with a standing AP and lateral of her left knee.

## 2023-05-03 ENCOUNTER — Other Ambulatory Visit (HOSPITAL_COMMUNITY): Payer: Self-pay

## 2023-05-05 ENCOUNTER — Telehealth: Payer: Self-pay

## 2023-05-05 NOTE — Telephone Encounter (Signed)
*  Primary  PA request received via CMM for Scopolamine 1MG /3DAYS 72 hr patches  PA submitted to [facility] Medicare and is pending additional questions/determination  Key: [id number]

## 2023-05-08 ENCOUNTER — Other Ambulatory Visit (HOSPITAL_COMMUNITY): Payer: Self-pay

## 2023-05-08 NOTE — Telephone Encounter (Signed)
Pharmacy Patient Advocate Encounter  Prior Authorization for Scopolamine 1MG /3DAYS 72 hr patches has been APPROVED by [employer] from 05/06/2023 to 08/04/2024.   PA # [id number] Copay is $23.12  [facility] Patient Advocate [phone]

## 2023-05-12 NOTE — Progress Notes (Unsigned)
[name] [address] [place] [address] Phone: [phone] Subjective:   Joan [name], am serving as a scribe for Dr. Antoine Primas.  I'm seeing this patient by the request  of:  Pincus Sanes, MD  CC: Left knee and low back pain follow-up  [id number]  02/04/2023 Patient still has some facet arthropathy noted.  Does have the. 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  Discussed if worsening pain consider facet injections at the L3-L4 and L4-L5 on the right side if necessary.  Patient was to see how she does with without it at the moment.  Discussed icing regimen and core strengthening and follow-up with me again time with patient today 31 minutes which does not include the procedure of ultrasound.  Still has some mild swelling noted on ultrasound today.  No signs of any infectious etiology.  Patient was given which we discussed potentially with prednisone with patient recent trip.  Patient declined at the moment.  Discussed with patient that I do feel that the antalgic gait is potentially contributing and giving her more of the back pain.  We discussed we could consider another nerve root injection but made a significant improvement previously but patient would like to avoid.  Follow-up with me again in 3 months otherwise.  Total time with patient 33 minutes     Update 05/13/2023 Joan [name] is a 67 y.o. female coming in with complaint of L knee and lumbar spine pain.  Patient has been in physical therapy and seem to discontinue in March of this year.  Regarding the knee patient did have a steroid injection greater than 1 year ago.  Patient states that her knee is improving after PT since she has been going to the gym. [facility] Was taking 2 Aleve 2x a day. Now using only one a day. Notices pain throughout her neck and arms now that she dropped down to one Aleve.  Inquires about supplements that would be beneficial ie glucosamine. Using fish oil and tart cherry extract.       Past Medical History:  Diagnosis Date   Allergy    Anemia    due to Dysmenorrhea   Arthritis    generalized    Hypertension    initially after BCP initiated    Hypothyroidism    PONV (postoperative nausea and vomiting)    Thyroid disease 2005 & 2009   nodule   Past Surgical History:  Procedure Laterality Date   ABDOMINAL HYSTERECTOMY  1998   for heavy menses and fibroids   BIOPSY THYROID  2005,2009   both negative   CESAREAN SECTION     x2   COLONOSCOPY  2009    Dr [facility]   EXPLORATORY LAPAROTOMY  1985   Ovarian cyst   FLEXIBLE SIGMOIDOSCOPY  1985    LLQ pain due to ovarian cyst   SIGMOIDOSCOPY     TONSILLECTOMY     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   Procedure: LEFT TOTAL KNEE ARTHROPLASTY;  Surgeon: Kathryne Hitch, MD;  Location: [facility];  Service: Orthopedics;  Laterality: Left;   Social History   Socioeconomic History   Marital status: Married    Spouse name: Not on file   Number of children: Not on file   Years of education: Not on file   Highest education level: Not on file  Occupational History  Not on file  Tobacco Use   Smoking status: Never   Smokeless tobacco: Never  Vaping Use   Vaping Use: Never used  Substance and Sexual Activity   Alcohol use: No   Drug use: No   Sexual activity: Yes    Birth control/protection: Surgical  Other Topics Concern   Not on file  Social History Narrative   RN at [employer]      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      Social Determinants of Health   Financial Resource Strain: Low Risk  (04/05/2022)   Overall Financial Resource Strain (CARDIA)    Difficulty of Paying Living Expenses: Not hard at all  Food Insecurity: No Food Insecurity (11/22/2022)   Hunger Vital Sign    Worried About Running Out of Food in the Last Year: Never true    Ran Out of Food in the Last Year: Never  true  Transportation Needs: No Transportation Needs (11/22/2022)   PRAPARE - Administrator, Civil Service (Medical): No    Lack of Transportation (Non-Medical): No  Physical Activity: Sufficiently Active (04/05/2022)   Exercise Vital Sign    Days of Exercise per Week: 5 days    Minutes of Exercise per Session: 30 min  Stress: No Stress Concern Present (04/05/2022)   [employer] of Occupational Health - Occupational Stress Questionnaire    Feeling of Stress : Not at all  Social Connections: Socially Integrated (04/05/2022)   Social Connection and Isolation Panel [NHANES]    Frequency of Communication with Friends and Family: More than three times a week    Frequency of Social Gatherings with Friends and Family: Not on file    Attends Religious Services: More than 4 times per year    Active Member of [employer] or Organizations: Yes    Attends Engineer, structural: More than 4 times per year    Marital Status: Married   Allergies  Allergen Reactions   Sulfonamide Derivatives Rash    Medi Alert bracelet  is recommended     Codeine Nausea Only   Tramadol Nausea And Vomiting   Family History  Problem Relation Age of Onset   Hypertension Father    Kidney failure Father        Bright's Disease; died @ 61   Hypertension Mother    Hyperlipidemia Mother    Osteoarthritis Mother    Hypertension Brother    Diabetes Brother    Stroke Maternal Grandmother        in 62s   Hypertension Maternal Aunt        also DJD/ OA   Breast cancer Maternal Aunt    Heart attack Maternal Grandfather 69       also  CVA   Heart block Paternal Grandmother        pacemaker   Colon cancer Neg Hx    Colon polyps Neg Hx    Esophageal cancer Neg Hx    Rectal cancer Neg Hx    Stomach cancer Neg Hx     Current Outpatient Medications (Endocrine & Metabolic):    estradiol (VIVELLE-DOT) 0.05 MG/24HR patch, APPLY 1 PATCH ONTO THE SKIN 2 TIMES WEEKLY   SYNTHROID 100 MCG tablet, Take 1  tablet (100 mcg total) by mouth every morning on an empty stomach  Current Outpatient Medications (Cardiovascular):    amLODipine-benazepril (LOTREL) 5-20 MG capsule, Take 1 capsule by mouth daily. Keep scheduled appt for future refills   triamterene-hydrochlorothiazide (DYAZIDE) 37.5-25 MG capsule, Take  1 capsule total by mouth daily.  Current Outpatient Medications (Respiratory):    cetirizine (ZYRTEC) 10 MG tablet, Take 10 mg by mouth at bedtime.  Current Outpatient Medications (Analgesics):    celecoxib (CELEBREX) 100 MG capsule, Take 1 capsule (100 mg total) by mouth 2 (two) times daily.   naproxen sodium (ALEVE) 220 MG tablet, Take 220 mg by mouth 2 (two) times daily with a meal.   Current Outpatient Medications (Other):    Multiple Vitamin (MULTIVITAMIN WITH MINERALS) TABS tablet, Take 1 tablet by mouth daily.   scopolamine (TRANSDERM-SCOP) 1 MG/3DAYS, Place 1 patch (1.5 mg total) onto the skin every 3 (three) days.   TART CHERRY PO, Take 1,200 mg by mouth daily.   Vitamin D, Ergocalciferol, (DRISDOL) 1.25 MG (50000 UNIT) CAPS capsule, Take 1 capsule by mouth once a week   Reviewed prior external information including notes and imaging from  primary care provider As well as notes that were available from care everywhere and other healthcare systems.  Past medical history, social, surgical and family history all reviewed in electronic medical record.  No pertanent information unless stated regarding to the chief complaint.   Review of Systems:  No headache, visual changes, nausea, vomiting, diarrhea, constipation, dizziness, abdominal pain, skin rash, fevers, chills, night sweats, weight loss, swollen lymph nodes, joint swelling, chest pain, shortness of breath, mood changes. POSITIVE muscle aches, body aches  Objective  Blood pressure (!) 142/98, pulse 88, height 5\' 4"  (1.626 m), weight 199 lb (90.3 kg), SpO2 98 %.   General: No apparent distress alert and oriented x3 mood and  affect normal, dressed appropriately.  HEENT: Pupils equal, extraocular movements intact  Respiratory: Patient's speak in full sentences and does not appear short of breath  Cardiovascular: No lower extremity edema, non tender, no erythema  Low back exam shows patient does have arthritic changes noted.  Does have some crepitus noted.  Some limited range of motion of the back noted.  Left knee exam shows no significant instability noted.  No significant swelling noted today.    Impression and Recommendations:    The above documentation has been reviewed and is accurate and complete Judi Saa, DO

## 2023-05-13 ENCOUNTER — Other Ambulatory Visit (HOSPITAL_COMMUNITY): Payer: Self-pay

## 2023-05-13 ENCOUNTER — Ambulatory Visit (INDEPENDENT_AMBULATORY_CARE_PROVIDER_SITE_OTHER): Payer: Medicare Other | Admitting: Family Medicine

## 2023-05-13 ENCOUNTER — Ambulatory Visit (INDEPENDENT_AMBULATORY_CARE_PROVIDER_SITE_OTHER): Payer: Medicare Other

## 2023-05-13 VITALS — BP 142/98 | HR 88 | Ht 64.0 in | Wt 199.0 lb

## 2023-05-13 DIAGNOSIS — M542 Cervicalgia: Secondary | ICD-10-CM | POA: Diagnosis not present

## 2023-05-13 DIAGNOSIS — M5416 Radiculopathy, lumbar region: Secondary | ICD-10-CM | POA: Diagnosis not present

## 2023-05-13 DIAGNOSIS — H903 Sensorineural hearing loss, bilateral: Secondary | ICD-10-CM | POA: Diagnosis not present

## 2023-05-13 MED ORDER — CELECOXIB 100 MG PO CAPS
100.0000 mg | ORAL_CAPSULE | Freq: Two times a day (BID) | ORAL | 0 refills | Status: DC
  Filled 2023-05-13: qty 60, 30d supply, fill #0

## 2023-05-13 NOTE — Patient Instructions (Signed)
Celebrex 100mg  BID Xray of neck today Omega 3s instead of Omega 6s  See me in 3-4 months

## 2023-05-13 NOTE — Assessment & Plan Note (Signed)
Continues to have the back pain.  Discussed the potential for another injection.  Patient would like to try Celebrex.  Discussed the potential cross reaction noted though with the sulfa medication.  Patient is still willing to try it.  See how patient responds.  Still a chronic problem overall.  Could be potentially worsening with patient having exacerbation secondary to having to pack Some of her recently deceased mother's belongings.  Follow-up with me again in 3 months

## 2023-05-15 ENCOUNTER — Telehealth: Payer: Self-pay | Admitting: Orthopaedic Surgery

## 2023-05-15 NOTE — Telephone Encounter (Signed)
Patient called asked if a note could be faxed over to her dental office stating she do not need to have an antibiotic before she get her teeth cleaned. Fax note to: Karlene Einstein    The fax # is [phone] The number to contact patient is [phone]

## 2023-05-15 NOTE — Telephone Encounter (Signed)
Note faxed to provided fax number.

## 2023-05-20 ENCOUNTER — Ambulatory Visit (HOSPITAL_BASED_OUTPATIENT_CLINIC_OR_DEPARTMENT_OTHER): Payer: Medicare Other

## 2023-06-02 ENCOUNTER — Encounter (HOSPITAL_BASED_OUTPATIENT_CLINIC_OR_DEPARTMENT_OTHER): Payer: Self-pay

## 2023-06-02 ENCOUNTER — Ambulatory Visit (HOSPITAL_BASED_OUTPATIENT_CLINIC_OR_DEPARTMENT_OTHER)
Admission: RE | Admit: 2023-06-02 | Discharge: 2023-06-02 | Disposition: A | Discharge status: Home / Self Care | Payer: Medicare Other | Source: Ambulatory Visit | Attending: Internal Medicine | Admitting: Internal Medicine

## 2023-06-02 DIAGNOSIS — Z136 Encounter for screening for cardiovascular disorders: Secondary | ICD-10-CM | POA: Insufficient documentation

## 2023-06-03 ENCOUNTER — Telehealth: Payer: Self-pay

## 2023-06-03 NOTE — Patient Outreach (Signed)
  Care Coordination   06/03/2023 Name: Joan [name] MRN: [id number] DOB: Feb 19, 1956   Care Coordination Outreach Attempts:  An unsuccessful telephone outreach was attempted today to offer the patient information about available care coordination services. RNCM spoke with patient who reports this is not a good time and request a call back-She declines scheduling a specific time.  Follow Up Plan:  Additional outreach attempts will be made to offer the patient care coordination information and services.   Encounter Outcome:  Pt. Request to Call Back   Care Coordination Interventions:  No, not indicated    Kathyrn Sheriff, RN, MSN, BSN, CCM [facility] Care Coordinator [phone]

## 2023-06-10 ENCOUNTER — Other Ambulatory Visit (HOSPITAL_COMMUNITY): Payer: Self-pay

## 2023-06-10 DIAGNOSIS — Z01419 Encounter for gynecological examination (general) (routine) without abnormal findings: Secondary | ICD-10-CM | POA: Diagnosis not present

## 2023-06-10 DIAGNOSIS — Z780 Asymptomatic menopausal state: Secondary | ICD-10-CM | POA: Diagnosis not present

## 2023-06-10 DIAGNOSIS — Z79890 Hormone replacement therapy (postmenopausal): Secondary | ICD-10-CM | POA: Diagnosis not present

## 2023-06-10 MED ORDER — ESTRADIOL 0.05 MG/24HR TD PTTW
1.0000 | MEDICATED_PATCH | TRANSDERMAL | 4 refills | Status: DC
  Filled 2023-06-10 – 2024-03-29 (×2): qty 24, 84d supply, fill #0

## 2023-06-12 ENCOUNTER — Other Ambulatory Visit (HOSPITAL_COMMUNITY): Payer: Self-pay

## 2023-06-12 DIAGNOSIS — E7800 Pure hypercholesterolemia, unspecified: Secondary | ICD-10-CM | POA: Diagnosis not present

## 2023-06-12 DIAGNOSIS — E890 Postprocedural hypothyroidism: Secondary | ICD-10-CM | POA: Diagnosis not present

## 2023-06-12 DIAGNOSIS — R7301 Impaired fasting glucose: Secondary | ICD-10-CM | POA: Diagnosis not present

## 2023-06-12 DIAGNOSIS — E559 Vitamin D deficiency, unspecified: Secondary | ICD-10-CM | POA: Diagnosis not present

## 2023-06-19 ENCOUNTER — Other Ambulatory Visit: Payer: Self-pay | Admitting: Endocrinology

## 2023-06-19 DIAGNOSIS — E012 Iodine-deficiency related (endemic) goiter, unspecified: Secondary | ICD-10-CM | POA: Diagnosis not present

## 2023-06-19 DIAGNOSIS — E041 Nontoxic single thyroid nodule: Secondary | ICD-10-CM | POA: Diagnosis not present

## 2023-06-19 DIAGNOSIS — R7301 Impaired fasting glucose: Secondary | ICD-10-CM | POA: Diagnosis not present

## 2023-06-19 DIAGNOSIS — E7800 Pure hypercholesterolemia, unspecified: Secondary | ICD-10-CM | POA: Diagnosis not present

## 2023-06-19 DIAGNOSIS — K7689 Other specified diseases of liver: Secondary | ICD-10-CM | POA: Diagnosis not present

## 2023-06-19 DIAGNOSIS — M8580 Other specified disorders of bone density and structure, unspecified site: Secondary | ICD-10-CM | POA: Diagnosis not present

## 2023-06-19 DIAGNOSIS — E559 Vitamin D deficiency, unspecified: Secondary | ICD-10-CM | POA: Diagnosis not present

## 2023-06-19 DIAGNOSIS — E890 Postprocedural hypothyroidism: Secondary | ICD-10-CM | POA: Diagnosis not present

## 2023-06-19 DIAGNOSIS — I10 Essential (primary) hypertension: Secondary | ICD-10-CM | POA: Diagnosis not present

## 2023-06-27 ENCOUNTER — Ambulatory Visit
Admission: RE | Admit: 2023-06-27 | Discharge: 2023-06-27 | Disposition: A | Discharge status: Home / Self Care | Payer: Medicare Other | Source: Ambulatory Visit | Attending: Endocrinology | Admitting: Endocrinology

## 2023-06-27 DIAGNOSIS — E041 Nontoxic single thyroid nodule: Secondary | ICD-10-CM

## 2023-06-27 DIAGNOSIS — E042 Nontoxic multinodular goiter: Secondary | ICD-10-CM | POA: Diagnosis not present

## 2023-06-30 ENCOUNTER — Other Ambulatory Visit (HOSPITAL_COMMUNITY): Payer: Self-pay

## 2023-07-04 ENCOUNTER — Telehealth: Payer: Self-pay

## 2023-07-04 NOTE — Patient Outreach (Signed)
  Care Coordination   Initial Visit Note   07/04/2023 Name: Sherrese Sanantonio MRN: [id number] DOB: December 13, 1955  Yuritza Virk is a 67 y.o. year old female who sees Burns, Bobette Mo, MD for primary care. I spoke with  Wiliam Ke by phone today.  What matters to the patients health and wellness today?  Discussed care coordination program. Unable to complete full assessment-denies any care coordination, resource or educational needs.  Goals Addressed             This Visit's Progress    COMPLETED: Care Coordination activities       Interventions Today    Flowsheet Row Most Recent Value  General Interventions   General Interventions Discussed/Reviewed --  [briefly discussed care coordination program. denies any needs.]            SDOH assessments and interventions completed:  Yes  SDOH Interventions Today    Flowsheet Row Most Recent Value  SDOH Interventions   Food Insecurity Interventions Intervention Not Indicated  Housing Interventions Intervention Not Indicated  Utilities Interventions Intervention Not Indicated     Care Coordination Interventions:  Yes, provided   Follow up plan: No further intervention required.   Encounter Outcome:  Pt. Visit Completed   Kathyrn Sheriff, RN, MSN, BSN, CCM [facility] Care Coordinator [phone]

## 2023-07-07 ENCOUNTER — Other Ambulatory Visit (HOSPITAL_COMMUNITY): Payer: Self-pay

## 2023-07-17 ENCOUNTER — Other Ambulatory Visit (HOSPITAL_COMMUNITY): Payer: Self-pay

## 2023-07-17 ENCOUNTER — Other Ambulatory Visit: Payer: Self-pay | Admitting: Internal Medicine

## 2023-07-17 MED ORDER — TRIAMTERENE-HCTZ 37.5-25 MG PO CAPS
1.0000 | ORAL_CAPSULE | Freq: Every day | ORAL | 2 refills | Status: DC
  Filled 2023-07-17: qty 90, 90d supply, fill #0
  Filled 2023-10-15: qty 90, 90d supply, fill #1
  Filled 2024-01-15: qty 90, 90d supply, fill #2

## 2023-07-17 MED ORDER — AMLODIPINE BESY-BENAZEPRIL HCL 5-20 MG PO CAPS
1.0000 | ORAL_CAPSULE | Freq: Every day | ORAL | 2 refills | Status: DC
  Filled 2023-07-17: qty 90, 90d supply, fill #0
  Filled 2023-10-15: qty 90, 90d supply, fill #1
  Filled 2024-01-15: qty 90, 90d supply, fill #2

## 2023-07-22 ENCOUNTER — Encounter: Payer: Self-pay | Admitting: Internal Medicine

## 2023-08-12 NOTE — Progress Notes (Unsigned)
[name] [address] [place] [address] Phone: [phone] Subjective:   Joan [name], am serving as a scribe for Dr. Antoine [name].  I'm seeing this patient by the request  of:  Joan Sanes, MD  CC: Low back pain  [id number]  05/13/2023 Continues to have the back pain.  Discussed the potential for another injection.  Patient would like to try Celebrex.  Discussed the potential cross reaction noted though with the sulfa medication.  Patient is still willing to try it.  See how patient responds.  Still a chronic problem overall.  Could be potentially worsening with patient having exacerbation secondary to having to pack Some of her recently deceased mother's belongings.  Follow-up with me again in 3 months   Update 08/13/2023 Joan [name] is a 67 y.o. female coming in with complaint of cervical and lumbar spine pain. Patient states that her back is doing better. Lifted some heavy box a few weeks ago which made her neck and upper back sore. she discontinued Aleve but is now taking Acetaminophen and caffeine tablet.   Knees feel stronger. Chair yoga and water walking have helped.    Last epidural of the lumbar spine was in June 2023.  Most recent x-rays of the cervical spine June 2024 showed moderate degenerative disc disease throughout but worse from C5-C7  Past Medical History:  Diagnosis Date   Allergy    Anemia    due to Dysmenorrhea   Arthritis    generalized    Hypertension    initially after BCP initiated    Hypothyroidism    PONV (postoperative nausea and vomiting)    Thyroid disease 2005 & 2009   nodule   Past Surgical History:  Procedure Laterality Date   ABDOMINAL HYSTERECTOMY  1998   for heavy menses and fibroids   BIOPSY THYROID  2005,2009   both negative   CESAREAN SECTION     x2   COLONOSCOPY  2009    [name]   EXPLORATORY LAPAROTOMY  1985   Ovarian cyst   FLEXIBLE SIGMOIDOSCOPY  1985    LLQ  pain due to ovarian cyst   SIGMOIDOSCOPY     TONSILLECTOMY     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   Procedure: LEFT TOTAL KNEE ARTHROPLASTY;  Surgeon: Joan Hitch, MD;  Location: [facility];  Service: Orthopedics;  Laterality: Left;   Social History   Socioeconomic History   Marital status: Married    Spouse name: Not on file   Number of children: Not on file   Years of education: Not on file   Highest education level: Not on file  Occupational History   Not on file  Tobacco Use   Smoking status: Never   Smokeless tobacco: Never  Vaping Use   Vaping status: Never Used  Substance and Sexual Activity   Alcohol use: No   Drug use: No   Sexual activity: Yes    Birth control/protection: Surgical  Other Topics Concern   Not on file  Social History Narrative   RN at [employer]      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      Social Determinants of Health   Financial Resource Strain: Low Risk  (04/05/2022)   Overall Financial Resource Strain (CARDIA)    Difficulty of Paying Living Expenses: Not hard at all  Food Insecurity: No Food Insecurity (07/04/2023)   Hunger Vital Sign    Worried About Running Out of  Food in the Last Year: Never true    Ran Out of Food in the Last Year: Never true  Transportation Needs: No Transportation Needs (11/22/2022)   PRAPARE - Administrator, Civil Service (Medical): No    Lack of Transportation (Non-Medical): No  Physical Activity: Sufficiently Active (04/05/2022)   Exercise Vital Sign    Days of Exercise per Week: 5 days    Minutes of Exercise per Session: 30 min  Stress: No Stress Concern Present (04/05/2022)   [name] of Occupational Health - Occupational Stress Questionnaire    Feeling of Stress : Not at all  Social Connections: Socially Integrated (04/05/2022)   Social Connection and Isolation Panel [NHANES]    Frequency of Communication with Friends and Family: More than three times a week    Frequency of Social  Gatherings with Friends and Family: Not on file    Attends Religious Services: More than 4 times per year    Active Member of [employer] or Organizations: Yes    Attends Engineer, structural: More than 4 times per year    Marital Status: Married   Allergies  Allergen Reactions   Sulfonamide Derivatives Rash    Medi Alert bracelet  is recommended     Codeine Nausea Only   Tramadol Nausea And Vomiting   Family History  Problem Relation Age of Onset   Hypertension Father    Kidney failure Father        Bright's Disease; died @ 15   Hypertension Mother    Hyperlipidemia Mother    Osteoarthritis Mother    Hypertension Brother    Diabetes Brother    Stroke Maternal Grandmother        in 94s   Hypertension Maternal Aunt        also DJD/ OA   Breast cancer Maternal Aunt    Heart attack Maternal Grandfather 35       also  CVA   Heart block Paternal Grandmother        pacemaker   Colon cancer Neg Hx    Colon polyps Neg Hx    Esophageal cancer Neg Hx    Rectal cancer Neg Hx    Stomach cancer Neg Hx     Current Outpatient Medications (Endocrine & Metabolic):    estradiol (VIVELLE-DOT) 0.05 MG/24HR patch, APPLY 1 PATCH ONTO THE SKIN 2 TIMES WEEKLY   estradiol (VIVELLE-DOT) 0.05 MG/24HR patch, Place 1 patch (0.05 mg total) onto the skin 2 (two) times a week.   SYNTHROID 100 MCG tablet, Take 1 tablet (100 mcg total) by mouth every morning on an empty stomach  Current Outpatient Medications (Cardiovascular):    amLODipine-benazepril (LOTREL) 5-20 MG capsule, Take 1 capsule by mouth daily.   triamterene-hydrochlorothiazide (DYAZIDE) 37.5-25 MG capsule, Take 1 capsule by mouth daily.  Current Outpatient Medications (Respiratory):    cetirizine (ZYRTEC) 10 MG tablet, Take 10 mg by mouth at bedtime.  Current Outpatient Medications (Analgesics):    naproxen sodium (ALEVE) 220 MG tablet, Take 220 mg by mouth 2 (two) times daily with a meal.   Current Outpatient Medications  (Other):    magnesium citrate SOLN, Take 1 Bottle by mouth once.   Multiple Vitamin (MULTIVITAMIN) tablet, Take 1 tablet by mouth daily.   Omega-3 Fatty Acids (FISH OIL) 1000 MG CAPS, Take by mouth.   Probiotic Product (PROBIOTIC BLEND PO), Take by mouth.   Multiple Vitamin (MULTIVITAMIN WITH MINERALS) TABS tablet, Take 1 tablet by mouth daily.  TART CHERRY PO, Take 1,200 mg by mouth daily.   Reviewed prior external information including notes and imaging from  primary care provider As well as notes that were available from care everywhere and other healthcare systems.  Past medical history, social, surgical and family history all reviewed in electronic medical record.  No pertanent information unless stated regarding to the chief complaint.   Review of Systems:  No headache, visual changes, nausea, vomiting, diarrhea, constipation, dizziness, abdominal pain, skin rash, fevers, chills, night sweats, weight loss, swollen lymph nodes, body aches, joint swelling, chest pain, shortness of breath, mood changes. POSITIVE muscle aches  Objective  Blood pressure 108/74, pulse 68, height 5\' 4"  (1.626 m), weight 197 lb (89.4 kg), SpO2 96%.   General: No apparent distress alert and oriented x3 mood and affect normal, dressed appropriately.  HEENT: Pupils equal, extraocular movements intact  Respiratory: Patient's speak in full sentences and does not appear short of breath  Cardiovascular: No lower extremity edema, non tender, no erythema  Low back exam shows some mild loss of lordosis.  This seems to be relatively stable.  Feels like she is making progress with her knee as well.  Does have the replacement noted. Able to get out of a chair without any significant difficulty.   Impression and Recommendations:     The above documentation has been reviewed and is accurate and complete Judi Saa, DO

## 2023-08-13 ENCOUNTER — Encounter: Payer: Self-pay | Admitting: Family Medicine

## 2023-08-13 ENCOUNTER — Ambulatory Visit (INDEPENDENT_AMBULATORY_CARE_PROVIDER_SITE_OTHER): Payer: Medicare Other | Admitting: Family Medicine

## 2023-08-13 VITALS — BP 108/74 | HR 68 | Ht 64.0 in | Wt 197.0 lb

## 2023-08-13 DIAGNOSIS — M1712 Unilateral primary osteoarthritis, left knee: Secondary | ICD-10-CM

## 2023-08-13 DIAGNOSIS — M5416 Radiculopathy, lumbar region: Secondary | ICD-10-CM | POA: Diagnosis not present

## 2023-08-13 NOTE — Assessment & Plan Note (Signed)
Patient feels like she is doing very well overall at the moment.  Discussed with her that we should continue to monitor and do home exercises on a regular basis.  Continue to work on [employer].  Continue to stay active.  Follow-up again in 6 to 8 weeks already.

## 2023-08-13 NOTE — Assessment & Plan Note (Signed)
Patient does like she is stable at the moment.  Wants to make no other significant changes.  Would like to continue to stay active otherwise.  Follow-up with me again in 6 to 8 weeks.  Patient otherwise can follow-up as needed if she feels like she is doing well and will make no changes in the medications at this time.

## 2023-08-14 ENCOUNTER — Other Ambulatory Visit: Payer: Self-pay | Admitting: Internal Medicine

## 2023-08-14 DIAGNOSIS — Z0000 Encounter for general adult medical examination without abnormal findings: Secondary | ICD-10-CM

## 2023-08-27 ENCOUNTER — Other Ambulatory Visit (HOSPITAL_COMMUNITY): Payer: Self-pay

## 2023-08-27 ENCOUNTER — Ambulatory Visit (AMBULATORY_SURGERY_CENTER): Payer: Medicare Other

## 2023-08-27 VITALS — Ht 64.0 in | Wt 198.0 lb

## 2023-08-27 DIAGNOSIS — Z860100 Personal history of colon polyps, unspecified: Secondary | ICD-10-CM

## 2023-08-27 MED ORDER — NA SULFATE-K SULFATE-MG SULF 17.5-3.13-1.6 GM/177ML PO SOLN
1.0000 | Freq: Once | ORAL | 0 refills | Status: AC
  Filled 2023-08-27: qty 354, 1d supply, fill #0

## 2023-08-27 NOTE — Progress Notes (Signed)
Pre visit completed via phone call; Patient verified name, DOB, and address; No egg or soy allergy known to patient;  No issues known to pt with past sedation with any surgeries or procedures----other than PONV; Patient denies ever being told they had issues or difficulty with intubation----other than PONV; No FH of Malignant Hyperthermia; Pt is not on diet pills; Pt is not on home 02;  Pt is not on blood thinners;  Pt denies issues with constipation;  No A fib or A flutter; Have any cardiac testing pending--NO Insurance verified during PV appt--- Medicare A/B and [employer] Pt can ambulate without assistance;  Pt denies use of chewing tobacco Discussed diabetic/weight loss medication holds; Discussed NSAID holds; Checked BMI to be less than 50; Pt instructed to use Singlecare.com or GoodRx for a price reduction on prep;  Patient's chart reviewed by Cathlyn Parsons CNRA prior to previsit and patient appropriate for the [facility]; Pre visit completed and red dot placed by patient's name on their procedure day (on provider's schedule);  Instructions sent to MyChart as well as printed and mailed to the patient per her request;

## 2023-09-01 ENCOUNTER — Other Ambulatory Visit (HOSPITAL_COMMUNITY): Payer: Self-pay

## 2023-09-04 DIAGNOSIS — Z23 Encounter for immunization: Secondary | ICD-10-CM | POA: Diagnosis not present

## 2023-09-10 ENCOUNTER — Encounter: Payer: Self-pay | Admitting: Internal Medicine

## 2023-09-10 ENCOUNTER — Ambulatory Visit: Payer: Medicare Other | Admitting: Internal Medicine

## 2023-09-10 VITALS — BP 111/89 | HR 72 | Temp 97.3°F | Resp 18 | Ht 64.0 in

## 2023-09-10 DIAGNOSIS — D125 Benign neoplasm of sigmoid colon: Secondary | ICD-10-CM

## 2023-09-10 DIAGNOSIS — D122 Benign neoplasm of ascending colon: Secondary | ICD-10-CM | POA: Diagnosis not present

## 2023-09-10 DIAGNOSIS — Z09 Encounter for follow-up examination after completed treatment for conditions other than malignant neoplasm: Secondary | ICD-10-CM

## 2023-09-10 DIAGNOSIS — K635 Polyp of colon: Secondary | ICD-10-CM | POA: Diagnosis not present

## 2023-09-10 DIAGNOSIS — Z860101 Personal history of adenomatous and serrated colon polyps: Secondary | ICD-10-CM | POA: Diagnosis not present

## 2023-09-10 DIAGNOSIS — Z860100 Personal history of colon polyps, unspecified: Secondary | ICD-10-CM

## 2023-09-10 MED ORDER — SODIUM CHLORIDE 0.9 % IV SOLN: 500.0000 mL | Freq: Once | INTRAVENOUS | Status: DC

## 2023-09-10 NOTE — Progress Notes (Signed)
Uneventful anesthetic. Report to pacu rn. Vss. Care resumed by rn. 

## 2023-09-10 NOTE — Patient Instructions (Signed)
Discharge instructions given. Handouts on polyps and Hemorrhoids. Resume previous medications. YOU HAD AN ENDOSCOPIC PROCEDURE TODAY AT THE [facility]:   Refer to the procedure report that was given to you for any specific questions about what was found during the examination.  If the procedure report does not answer your questions, please call your gastroenterologist to clarify.  If you requested that your care partner not be given the details of your procedure findings, then the procedure report has been included in a sealed envelope for you to review at your convenience later.  YOU SHOULD EXPECT: Some feelings of bloating in the abdomen. Passage of more gas than usual.  Walking can help get rid of the air that was put into your GI tract during the procedure and reduce the bloating. If you had a lower endoscopy (such as a colonoscopy or flexible sigmoidoscopy) you may notice spotting of blood in your stool or on the toilet paper. If you underwent a bowel prep for your procedure, you may not have a normal bowel movement for a few days.  Please Note:  You might notice some irritation and congestion in your nose or some drainage.  This is from the oxygen used during your procedure.  There is no need for concern and it should clear up in a day or so.  SYMPTOMS TO REPORT IMMEDIATELY:  Following lower endoscopy (colonoscopy or flexible sigmoidoscopy):  Excessive amounts of blood in the stool  Significant tenderness or worsening of abdominal pains  Swelling of the abdomen that is new, acute  Fever of 100F or higher   For urgent or emergent issues, a gastroenterologist can be reached at any hour by calling [phone]. Do not use MyChart messaging for urgent concerns.    DIET:  We do recommend a small meal at first, but then you may proceed to your regular diet.  Drink plenty of fluids but you should avoid alcoholic beverages for 24 hours.  ACTIVITY:  You should plan to take it  easy for the rest of today and you should NOT DRIVE or use heavy machinery until tomorrow (because of the sedation medicines used during the test).    FOLLOW UP: Our staff will call the number listed on your records the next business day following your procedure.  We will call around 7:15- 8:00 am to check on you and address any questions or concerns that you may have regarding the information given to you following your procedure. If we do not reach you, we will leave a message.     If any biopsies were taken you will be contacted by phone or by letter within the next 1-3 weeks.  Please call us at [phone] if you have not heard about the biopsies in 3 weeks.    SIGNATURES/CONFIDENTIALITY: You and/or your care partner have signed paperwork which will be entered into your electronic medical record.  These signatures attest to the fact that that the information above on your After Visit Summary has been reviewed and is understood.  Full responsibility of the confidentiality of this discharge information lies with you and/or your care-partner. 

## 2023-09-10 NOTE — Progress Notes (Signed)
Called to room to assist during endoscopic procedure.  Patient ID and intended procedure confirmed with present staff. Received instructions for my participation in the procedure from the performing physician.  

## 2023-09-10 NOTE — Progress Notes (Signed)
Pt's states no medical or surgical changes since previsit or office visit. 

## 2023-09-10 NOTE — Op Note (Signed)
[facility] Patient Name: Joan [name] Procedure Date: 09/10/2023 3:03 PM MRN: [id number] Endoscopist: Beverley Fiedler , MD, [id number] Age: 67 Referring MD:  Date of Birth: 1956/05/11 Gender: Female Account #: [id number] Procedure:                Colonoscopy Indications:              High risk colon cancer surveillance: Personal                            history of non-advanced adenoma, Last colonoscopy:                            June 2019 (TA x 1) Medicines:                Monitored Anesthesia Care Procedure:                Pre-Anesthesia Assessment:                           - Prior to the procedure, a History and Physical                            was performed, and patient medications and                            allergies were reviewed. The patient's tolerance of                            previous anesthesia was also reviewed. The risks                            and benefits of the procedure and the sedation                            options and risks were discussed with the patient.                            All questions were answered, and informed consent                            was obtained. Prior Anticoagulants: The patient has                            taken no anticoagulant or antiplatelet agents. ASA                            Grade Assessment: II - A patient with mild systemic                            disease. After reviewing the risks and benefits,                            the patient was deemed in satisfactory condition to  undergo the procedure.                           After obtaining informed consent, the colonoscope                            was passed under direct vision. Throughout the                            procedure, the patient's blood pressure, pulse, and                            oxygen saturations were monitored continuously. The                            Olympus Scope [id number] was introduced  through the                            anus and advanced to the cecum, identified by the                            appendiceal orifice. The colonoscopy was performed                            without difficulty. The patient tolerated the                            procedure well. The quality of the bowel                            preparation was good. The ileocecal valve,                            appendiceal orifice, and rectum were photographed. Scope In: 3:11:53 PM Scope Out: 3:22:27 PM Scope Withdrawal Time: 0 hours 8 minutes 30 seconds  Total Procedure Duration: 0 hours 10 minutes 34 seconds  Findings:                 The digital rectal exam was normal.                           A 4 mm polyp was found in the ascending colon. The                            polyp was sessile. The polyp was removed with a                            cold snare. Resection and retrieval were complete.                           A 5 mm polyp was found in the sigmoid colon. The                            polyp was sessile. The polyp  was removed with a                            cold snare. Resection and retrieval were complete.                           Internal hemorrhoids were found during                            retroflexion. The hemorrhoids were small. Complications:            No immediate complications. Estimated Blood Loss:     Estimated blood loss was minimal. Impression:               - One 4 mm polyp in the ascending colon, removed                            with a cold snare. Resected and retrieved.                           - One 5 mm polyp in the sigmoid colon, removed with                            a cold snare. Resected and retrieved.                           - Small internal hemorrhoids. Recommendation:           - Patient has a contact number available for                            emergencies. The signs and symptoms of potential                            delayed complications  were discussed with the                            patient. Return to normal activities tomorrow.                            Written discharge instructions were provided to the                            patient.                           - Resume previous diet.                           - Continue present medications.                           - Await pathology results.                           - Repeat colonoscopy is recommended for  surveillance. The colonoscopy date will be                            determined after pathology results from today's                            exam become available for review. Beverley Fiedler, MD 09/10/2023 3:24:54 PM This report has been signed electronically.

## 2023-09-11 ENCOUNTER — Telehealth: Payer: Self-pay | Admitting: *Deleted

## 2023-09-11 NOTE — Telephone Encounter (Signed)
  Follow up Call-     09/10/2023    2:46 PM  Call back number  Post procedure Call Back phone  # [phone]  Permission to leave phone message Yes     Patient questions:  Do you have a fever, pain , or abdominal swelling? No. Pain Score  0 *  Have you tolerated food without any problems? Yes.    Have you been able to return to your normal activities? Yes.    Do you have any questions about your discharge instructions: Diet   No. Medications  No. Follow up visit  No.  Do you have questions or concerns about your Care? No.  Actions: * If pain score is 4 or above: No action needed, pain <4.

## 2023-09-12 DIAGNOSIS — Z23 Encounter for immunization: Secondary | ICD-10-CM | POA: Diagnosis not present

## 2023-09-15 LAB — SURGICAL PATHOLOGY

## 2023-09-16 ENCOUNTER — Encounter: Payer: Self-pay | Admitting: Internal Medicine

## 2023-09-17 ENCOUNTER — Ambulatory Visit
Admission: RE | Admit: 2023-09-17 | Discharge: 2023-09-17 | Disposition: A | Discharge status: Home / Self Care | Payer: Medicare Other | Source: Ambulatory Visit | Attending: Internal Medicine | Admitting: Internal Medicine

## 2023-09-17 DIAGNOSIS — Z1231 Encounter for screening mammogram for malignant neoplasm of breast: Secondary | ICD-10-CM | POA: Diagnosis not present

## 2023-09-17 DIAGNOSIS — Z0000 Encounter for general adult medical examination without abnormal findings: Secondary | ICD-10-CM

## 2023-09-30 ENCOUNTER — Other Ambulatory Visit: Payer: Self-pay

## 2023-09-30 ENCOUNTER — Other Ambulatory Visit (HOSPITAL_COMMUNITY): Payer: Self-pay

## 2023-10-02 ENCOUNTER — Other Ambulatory Visit (HOSPITAL_COMMUNITY): Payer: Self-pay

## 2023-10-09 ENCOUNTER — Other Ambulatory Visit (HOSPITAL_BASED_OUTPATIENT_CLINIC_OR_DEPARTMENT_OTHER): Payer: Self-pay

## 2023-10-13 ENCOUNTER — Ambulatory Visit (INDEPENDENT_AMBULATORY_CARE_PROVIDER_SITE_OTHER): Payer: Medicare Other

## 2023-10-13 VITALS — Ht 64.0 in | Wt 198.0 lb

## 2023-10-13 DIAGNOSIS — Z0000 Encounter for general adult medical examination without abnormal findings: Secondary | ICD-10-CM | POA: Diagnosis not present

## 2023-10-13 NOTE — Patient Instructions (Signed)
Joan [name] , Thank you for taking time to come for your Medicare Wellness Visit. I appreciate your ongoing commitment to your health goals. Please review the following plan we discussed and let me know if I can assist you in the future.   Referrals/Orders/Follow-Ups/Clinician Recommendations: Keep up the good work.  This is a list of the screening recommended for you and due dates:  Health Maintenance  Topic Date Due   COVID-19 Vaccine (7 - 2023-24 season) 11/07/2023   Mammogram  09/16/2024   Medicare Annual Wellness Visit  10/12/2024   DEXA scan (bone density measurement)  04/11/2025   DTaP/Tdap/Td vaccine (3 - Td or Tdap) 06/29/2025   Colon Cancer Screening  09/09/2030   Pneumonia Vaccine  Completed   Flu Shot  Completed   Hepatitis C Screening  Completed   Zoster (Shingles) Vaccine  Completed   HPV Vaccine  Aged Out    Advanced directives: (Copy Requested) Please bring a copy of your health care power of attorney and living will to the office to be added to your chart at your convenience.  Next Medicare Annual Wellness Visit scheduled for next year: Yes

## 2023-10-13 NOTE — Progress Notes (Signed)
Subjective:   Joan [name] is a 67 y.o. female who presents for Medicare Annual (Subsequent) preventive examination.  Visit Complete: Virtual I connected with  Wiliam Ke on 10/13/23 by a audio enabled telemedicine application and verified that I am speaking with the correct person using two identifiers.  Patient Location: Home  Provider Location: Office/Clinic  I discussed the limitations of evaluation and management by telemedicine. The patient expressed understanding and agreed to proceed.  Vital Signs: Because this visit was a virtual/telehealth visit, some criteria may be missing or patient reported. Any vitals not documented were not able to be obtained and vitals that have been documented are patient reported.   Cardiac Risk Factors include: advanced age (>58men, >10 women);hypertension;Other (see comment), Risk factor comments: Fatty liver, Osteopenia     Objective:    Today's Vitals   10/13/23 1515  Weight: 198 lb (89.8 kg)  Height: 5\' 4"  (1.626 m)   Body mass index is 33.99 kg/m.     10/13/2023    3:21 PM 12/09/2022   10:17 AM 11/22/2022   11:17 AM 11/13/2022   10:57 AM 04/05/2022    4:01 PM 07/09/2021    4:09 PM 05/08/2018    1:03 PM  Advanced Directives  Does Patient Have a Medical Advance Directive? Yes Yes Yes Yes Yes Yes Yes  Type of Estate agent of [place];Living will Healthcare Power of [place];Living will Healthcare Power of [place];Living will Healthcare Power of [place];Living will Living will;Healthcare Power of [employer] Power of [place];Living will Healthcare Power of [place];Living will  Does patient want to make changes to medical advance directive?   No - Patient declined  No - Patient declined No - Patient declined   Copy of Healthcare Power of Attorney in Chart? No - copy requested No - copy requested No - copy requested No - copy requested No - copy requested No - copy requested     Current Medications  (verified) Outpatient Encounter Medications as of 10/13/2023  Medication Sig   amLODipine-benazepril (LOTREL) 5-20 MG capsule Take 1 capsule by mouth daily.   cetirizine (ZYRTEC) 10 MG tablet Take 10 mg by mouth daily as needed for allergies.   estradiol (VIVELLE-DOT) 0.05 MG/24HR patch Place 1 patch (0.05 mg total) onto the skin 2 (two) times a week.   Magnesium 250 MG TABS Take 2 tablets by mouth at bedtime.   naproxen sodium (ALEVE) 220 MG tablet Take 220 mg by mouth 2 (two) times daily with a meal.   Omega-3 Fatty Acids (FISH OIL) 1000 MG CAPS Take by mouth.   Probiotic Product (PROBIOTIC BLEND PO) Take by mouth.   SYNTHROID 100 MCG tablet Take 1 tablet (100 mcg total) by mouth every morning on an empty stomach   TART CHERRY PO Take 2,400 mg by mouth daily.   triamterene-hydrochlorothiazide (DYAZIDE) 37.5-25 MG capsule Take 1 capsule by mouth daily.   Vitamin D, Ergocalciferol, (DRISDOL) 1.25 MG (50000 UNIT) CAPS capsule Take 1 capsule by mouth once a week.   No facility-administered encounter medications on file as of 10/13/2023.    Allergies (verified) Codeine, Tramadol, Tramadol hcl, and Sulfonamide derivatives   History: Past Medical History:  Diagnosis Date   Allergy    Anemia    due to Dysmenorrhea   Arthritis    generalized    Hypertension    on meds   Hypothyroidism    on meds   PONV (postoperative nausea and vomiting)    Thyroid disease 2005 &  2009   nodule   Past Surgical History:  Procedure Laterality Date   ABDOMINAL HYSTERECTOMY  1998   for heavy menses and fibroids   BIOPSY THYROID  2005,2009   both negative   CESAREAN SECTION     x2   COLONOSCOPY  2009    Dr [facility]   EXPLORATORY LAPAROTOMY  1985   Ovarian cyst   FLEXIBLE SIGMOIDOSCOPY  1985    LLQ pain due to ovarian cyst   SIGMOIDOSCOPY     TONSILLECTOMY     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   Procedure: LEFT TOTAL KNEE ARTHROPLASTY;  Surgeon: Kathryne Hitch, MD;   Location: [facility];  Service: Orthopedics;  Laterality: Left;   Family History  Problem Relation Age of Onset   Hypertension Father    Kidney failure Father        Bright's Disease; died @ 64   Hypertension Mother    Hyperlipidemia Mother    Osteoarthritis Mother    Hypertension Brother    Diabetes Brother    Stroke Maternal Grandmother        in 47s   Hypertension Maternal Aunt        also DJD/ OA   Breast cancer Maternal Aunt    Heart attack Maternal Grandfather 30       also  CVA   Heart block Paternal Grandmother        pacemaker   Colon cancer Neg Hx    Colon polyps Neg Hx    Esophageal cancer Neg Hx    Rectal cancer Neg Hx    Stomach cancer Neg Hx    Social History   Socioeconomic History   Marital status: Married    Spouse name: Archivist   Number of children: 2   Years of education: Not on file   Highest education level: Not on file  Occupational History   Occupation: Retired/ Publishing copy  Tobacco Use   Smoking status: Never   Smokeless tobacco: Never  Vaping Use   Vaping status: Never Used  Substance and Sexual Activity   Alcohol use: No   Drug use: No   Sexual activity: Yes    Birth control/protection: Surgical  Other Topics Concern   Not on file  Social History Narrative   RN at [employer]      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   Lives with husband      Social Determinants of Health   Financial Resource Strain: Low Risk  (10/13/2023)   Overall Financial Resource Strain (CARDIA)    Difficulty of Paying Living Expenses: Not hard at all  Food Insecurity: No Food Insecurity (10/13/2023)   Hunger Vital Sign    Worried About Running Out of Food in the Last Year: Never true    Ran Out of Food in the Last Year: Never true  Transportation Needs: No Transportation Needs (10/13/2023)   PRAPARE - Administrator, Civil Service (Medical): No    Lack of Transportation (Non-Medical): No  Physical Activity: Sufficiently Active (10/13/2023)   Exercise  Vital Sign    Days of Exercise per Week: 4 days    Minutes of Exercise per Session: 60 min  Stress: No Stress Concern Present (10/13/2023)   [employer] of Occupational Health - Occupational Stress Questionnaire    Feeling of Stress : Not at all  Social Connections: Socially Integrated (10/13/2023)   Social Connection and Isolation Panel [NHANES]    Frequency of Communication with Friends and Family: More  than three times a week    Frequency of Social Gatherings with Friends and Family: More than three times a week    Attends Religious Services: More than 4 times per year    Active Member of [employer] or Organizations: Yes    Attends Banker Meetings: Never    Marital Status: Married    Tobacco Counseling Counseling given: Not Answered   Clinical Intake:  Pre-visit preparation completed: Yes  Pain : No/denies pain     BMI - recorded: 33.99 Nutritional Status: BMI > 30  Obese Nutritional Risks: None Diabetes: No  How often do you need to have someone help you when you read instructions, pamphlets, or other written materials from your doctor or pharmacy?: 1 - Never  Interpreter Needed?: No  Information entered by :: Seba Madole, RMA   Activities of Daily Living    10/13/2023    3:18 PM 11/22/2022   11:20 AM  In your present state of health, do you have any difficulty performing the following activities:  Hearing? 1   Comment issues with hearing   Vision? 0   Difficulty concentrating or making decisions? 0   Walking or climbing stairs? 0   Dressing or bathing? 0   Doing errands, shopping? 0 0  Preparing Food and eating ? N   Using the Toilet? N   In the past six months, have you accidently leaked urine? N   Do you have problems with loss of bowel control? N   Managing your Medications? N   Managing your Finances? N   Housekeeping or managing your Housekeeping? N     Patient Care Team: Pincus Sanes, MD as PCP - General (Internal  Medicine) Sinda Du, MD as Consulting Physician (Ophthalmology)  Indicate any recent Medical Services you may have received from other than [facility] providers in the past year (date may be approximate).     Assessment:   This is a routine wellness examination for [place].  Hearing/Vision screen Hearing Screening - Comments:: Some issues Vision Screening - Comments:: Wears eyeglasses   Goals Addressed   None   Depression Screen    10/13/2023    3:29 PM 04/10/2023    9:57 AM 04/08/2022    9:49 AM 04/05/2022    4:03 PM 09/14/2021    8:40 AM 07/25/2021    1:40 PM 03/28/2020    1:49 PM  PHQ 2/9 Scores  PHQ - 2 Score 0 0 0 0 0 0 0  PHQ- 9 Score 0  0   0     Fall Risk    10/13/2023    3:21 PM 04/10/2023    9:57 AM 04/08/2022    9:49 AM 04/05/2022    4:03 PM 09/14/2021    8:40 AM  Fall Risk   Falls in the past year? 0 0 0 0 0  Number falls in past yr: 0 0 0 0 0  Injury with Fall? 0 0 0 0 0  Risk for fall due to : No Fall Risks No Fall Risks No Fall Risks No Fall Risks   Follow up Falls prevention discussed;Falls evaluation completed Falls evaluation completed Falls evaluation completed Falls evaluation completed     MEDICARE RISK AT HOME: Medicare Risk at Home Any stairs in or around the home?: No Home free of loose throw rugs in walkways, pet beds, electrical cords, etc?: Yes Adequate lighting in your home to reduce risk of falls?: Yes Life alert?: No Use of a  cane, walker or w/c?: No Grab bars in the bathroom?: Yes Shower chair or bench in shower?: No Elevated toilet seat or a handicapped toilet?: No  TIMED UP AND GO:  Was the test performed?  No    Cognitive Function:        10/13/2023    3:22 PM 04/05/2022    4:04 PM  6CIT Screen  What Year? 0 points 0 points  What month? 0 points 0 points  What time? 0 points 0 points  Count back from 20 0 points 0 points  Months in reverse 0 points 0 points  Repeat phrase 0 points 0 points  Total Score 0 points 0  points    Immunizations Immunization History  Administered Date(s) Administered   Influenza,inj,Quad PF,6+ Mos 09/20/2013, 09/08/2015   Influenza-Unspecified 08/25/2021, 09/16/2022   PFIZER Comirnaty(Gray Top)Covid-19 Tri-Sucrose Vaccine 04/06/2021   PFIZER(Purple Top)SARS-COV-2 Vaccination 11/15/2019, 12/06/2019   Pfizer Covid-19 Vaccine Bivalent Booster 44yrs & up 08/21/2021, 01/27/2023, 09/12/2023   Pneumococcal Conjugate-13 07/15/2019   Pneumococcal Polysaccharide-23 07/19/2020   Respiratory Syncytial Virus Vaccine,Recomb Aduvanted(Arexvy) 12/25/2022   Td 11/25/2004   Tdap 06/30/2015   Zoster Recombinant(Shingrix) 12/28/2021, 03/27/2022    TDAP status: Up to date  Flu Vaccine status: Up to date  Pneumococcal vaccine status: Up to date  Covid-19 vaccine status: Completed vaccines  Qualifies for Shingles Vaccine? Yes   Zostavax completed Yes   Shingrix Completed?: Yes  Screening Tests Health Maintenance  Topic Date Due   COVID-19 Vaccine (7 - 2023-24 season) 11/07/2023   MAMMOGRAM  09/16/2024   Medicare Annual Wellness (AWV)  10/12/2024   DEXA SCAN  04/11/2025   DTaP/Tdap/Td (3 - Td or Tdap) 06/29/2025   Colonoscopy  09/09/2030   Pneumonia Vaccine 66+ Years old  Completed   INFLUENZA VACCINE  Completed   Hepatitis C Screening  Completed   Zoster Vaccines- Shingrix  Completed   HPV VACCINES  Aged Out    Health Maintenance  There are no preventive care reminders to display for this patient.   Colorectal cancer screening: Type of screening: Colonoscopy. Completed 09/10/2023. Repeat every 5 years  Mammogram status: Completed 09/17/2023. Repeat every year  Bone Density status: Completed 04/11/2022. Results reflect: Bone density results: OSTEOPENIA. Repeat every 2 years.  Lung Cancer Screening: (Low Dose CT Chest recommended if Age 110-80 years, 20 pack-year currently smoking OR have quit w/in 15years.) does not qualify.   Lung Cancer Screening Referral:  N/A  Additional Screening:  Hepatitis C Screening: does qualify; Completed 07/08/2016  Vision Screening: Recommended annual ophthalmology exams for early detection of glaucoma and other disorders of the eye. Is the patient up to date with their annual eye exam?  Yes  Who is the provider or what is the name of the office in which the patient attends annual eye exams? Dr Cathey Endow If pt is not established with a provider, would they like to be referred to a provider to establish care? No .   Dental Screening: Recommended annual dental exams for proper oral hygiene   Community Resource Referral / Chronic Care Management: CRR required this visit?  No   CCM required this visit?  No     Plan:     I have personally reviewed and noted the following in the patient's chart:   Medical and social history Use of alcohol, tobacco or illicit drugs  Current medications and supplements including opioid prescriptions. Patient is not currently taking opioid prescriptions. Functional ability and status Nutritional status Physical activity Advanced  directives List of other physicians Hospitalizations, surgeries, and ER visits in previous 12 months Vitals Screenings to include cognitive, depression, and falls Referrals and appointments  In addition, I have reviewed and discussed with patient certain preventive protocols, quality metrics, and best practice recommendations. A written personalized care plan for preventive services as well as general preventive health recommendations were provided to patient.     Shanik [place] L Justise Ehmann, CMA   10/13/2023   After Visit Summary: (MyChart) Due to this being a telephonic visit, the after visit summary with patients personalized plan was offered to patient via MyChart   Nurse Notes: Patient is up to date with all health maintenance.  She had no concerns to address today.

## 2023-10-14 ENCOUNTER — Other Ambulatory Visit (HOSPITAL_COMMUNITY): Payer: Self-pay

## 2023-10-15 ENCOUNTER — Other Ambulatory Visit (HOSPITAL_COMMUNITY): Payer: Self-pay

## 2023-10-15 ENCOUNTER — Other Ambulatory Visit: Payer: Self-pay

## 2023-10-17 ENCOUNTER — Other Ambulatory Visit (HOSPITAL_COMMUNITY): Payer: Self-pay

## 2023-11-05 ENCOUNTER — Encounter: Payer: Self-pay | Admitting: Orthopaedic Surgery

## 2023-11-05 ENCOUNTER — Ambulatory Visit (INDEPENDENT_AMBULATORY_CARE_PROVIDER_SITE_OTHER): Payer: Medicare Other | Admitting: Orthopaedic Surgery

## 2023-11-05 ENCOUNTER — Other Ambulatory Visit (INDEPENDENT_AMBULATORY_CARE_PROVIDER_SITE_OTHER): Payer: Self-pay

## 2023-11-05 DIAGNOSIS — Z96652 Presence of left artificial knee joint: Secondary | ICD-10-CM

## 2023-11-05 NOTE — Progress Notes (Signed)
The patient is now almost one year status post a left total knee replacement to treat significant left knee arthritis she reports the knee is doing well.  She still has problems when she is kneeling down on her knee.  She is 67.  She stays active.  She still participates in water walking and water aerobics.  She reports good range of motion and strength.  Her left knee does have full range of motion.  It is ligamentously stable and feels strong on exam.  There is no effusion.  There is no instability on exam.  2 views of the left knee show well-seated press-fit total knee arthroplasty with no complicating features.  At this point follow-up for her left knee can be as needed.  If he does develop any issues with that knee or her right knee she knows to let us know.  All questions and concerns were addressed and answered.

## 2023-11-06 ENCOUNTER — Other Ambulatory Visit (HOSPITAL_COMMUNITY): Payer: Self-pay

## 2023-11-06 MED ORDER — VITAMIN D (ERGOCALCIFEROL) 1.25 MG (50000 UNIT) PO CAPS
50000.0000 [IU] | ORAL_CAPSULE | ORAL | 4 refills | Status: AC
Start: 2023-11-06 — End: ?
  Filled 2023-11-06: qty 12, 84d supply, fill #0
  Filled 2024-01-15: qty 12, 84d supply, fill #1
  Filled 2024-03-29: qty 12, 84d supply, fill #0
  Filled 2024-07-29: qty 12, 84d supply, fill #1
  Filled 2024-10-12: qty 12, 84d supply, fill #2

## 2023-11-08 ENCOUNTER — Other Ambulatory Visit (HOSPITAL_COMMUNITY): Payer: Self-pay

## 2023-11-10 ENCOUNTER — Other Ambulatory Visit: Payer: Self-pay | Admitting: Internal Medicine

## 2023-11-10 ENCOUNTER — Other Ambulatory Visit: Payer: Self-pay

## 2023-11-10 MED ORDER — SCOPOLAMINE 1 MG/3DAYS TD PT72
1.0000 | MEDICATED_PATCH | TRANSDERMAL | 0 refills | Status: DC
  Filled 2023-11-10 – 2023-11-12 (×2): qty 4, 12d supply, fill #0

## 2023-11-11 ENCOUNTER — Other Ambulatory Visit (HOSPITAL_COMMUNITY): Payer: Self-pay

## 2023-11-11 DIAGNOSIS — H0012 Chalazion right lower eyelid: Secondary | ICD-10-CM | POA: Diagnosis not present

## 2023-11-11 MED ORDER — DOXYCYCLINE HYCLATE 50 MG PO CAPS
50.0000 mg | ORAL_CAPSULE | Freq: Every day | ORAL | 0 refills | Status: DC
  Filled 2023-11-11: qty 30, 30d supply, fill #0

## 2023-11-12 ENCOUNTER — Other Ambulatory Visit (HOSPITAL_COMMUNITY): Payer: Self-pay

## 2023-11-12 ENCOUNTER — Other Ambulatory Visit: Payer: Self-pay

## 2023-12-16 DIAGNOSIS — H04123 Dry eye syndrome of bilateral lacrimal glands: Secondary | ICD-10-CM | POA: Diagnosis not present

## 2023-12-16 DIAGNOSIS — H0012 Chalazion right lower eyelid: Secondary | ICD-10-CM | POA: Diagnosis not present

## 2023-12-29 ENCOUNTER — Other Ambulatory Visit (HOSPITAL_COMMUNITY): Payer: Self-pay

## 2024-01-15 ENCOUNTER — Other Ambulatory Visit (HOSPITAL_COMMUNITY): Payer: Self-pay

## 2024-03-29 ENCOUNTER — Other Ambulatory Visit (HOSPITAL_BASED_OUTPATIENT_CLINIC_OR_DEPARTMENT_OTHER): Payer: Self-pay

## 2024-03-29 ENCOUNTER — Other Ambulatory Visit: Payer: Self-pay | Admitting: Internal Medicine

## 2024-03-29 ENCOUNTER — Other Ambulatory Visit: Payer: Self-pay

## 2024-03-29 MED ORDER — SYNTHROID 100 MCG PO TABS
100.0000 ug | ORAL_TABLET | Freq: Every day | ORAL | 11 refills | Status: DC
Start: 2024-03-29 — End: 2024-04-16
  Filled 2024-03-29: qty 30, 30d supply, fill #0

## 2024-03-29 MED ORDER — AMLODIPINE BESY-BENAZEPRIL HCL 5-20 MG PO CAPS
1.0000 | ORAL_CAPSULE | Freq: Every day | ORAL | 2 refills | Status: AC
  Filled 2024-03-29: qty 90, 90d supply, fill #0
  Filled 2024-07-10: qty 90, 90d supply, fill #1
  Filled 2024-10-11: qty 90, 90d supply, fill #2

## 2024-03-29 MED ORDER — TRIAMTERENE-HCTZ 37.5-25 MG PO CAPS
1.0000 | ORAL_CAPSULE | Freq: Every day | ORAL | 2 refills | Status: AC
  Filled 2024-03-29: qty 90, 90d supply, fill #0
  Filled 2024-07-10: qty 90, 90d supply, fill #1
  Filled 2024-10-11: qty 90, 90d supply, fill #2

## 2024-03-29 MED ORDER — SCOPOLAMINE 1 MG/3DAYS TD PT72
1.0000 | MEDICATED_PATCH | TRANSDERMAL | 0 refills | Status: DC
  Filled 2024-03-29: qty 4, 12d supply, fill #0

## 2024-03-29 MED ORDER — SYNTHROID 100 MCG PO TABS
100.0000 ug | ORAL_TABLET | Freq: Every day | ORAL | 11 refills | Status: AC
  Filled 2024-04-28: qty 30, 30d supply, fill #0
  Filled 2024-05-26: qty 30, 30d supply, fill #1
  Filled 2024-06-23: qty 30, 30d supply, fill #2

## 2024-04-15 ENCOUNTER — Ambulatory Visit: Payer: Medicare Other | Admitting: Internal Medicine

## 2024-04-15 ENCOUNTER — Encounter: Payer: Self-pay | Admitting: Internal Medicine

## 2024-04-15 NOTE — Patient Instructions (Addendum)
      Blood work was ordered.       Medications changes include :   None      Return in about 1 year (around 04/16/2025) for follow up.

## 2024-04-15 NOTE — Progress Notes (Signed)
 Subjective:    Patient ID: Joan [name], female    DOB: 07-17-1956, 68 y.o.   MRN: [id number]     HPI Joan [name] is here for follow up of her chronic medical problems.  S/p L TKR  - doing well.   Going to gym.  Difficulty losing weight.  Does walking in the water  1-2 / week.  Walks on treadmill.  Eating well and watches her calories.   Medications and allergies reviewed with patient and updated if appropriate.  Current Outpatient Medications on File Prior to Visit  Medication Sig Dispense Refill   amLODipine -benazepril  (LOTREL ) 5-20 MG capsule Take 1 capsule by mouth daily. 90 capsule 2   cetirizine  (ZYRTEC ) 10 MG tablet Take 10 mg by mouth daily as needed for allergies.     doxycycline  (VIBRAMYCIN ) 50 MG capsule Take 1 capsule (50 mg total) by mouth daily. 30 capsule 0   estradiol  (VIVELLE -DOT) 0.05 MG/24HR patch Place 1 patch (0.05 mg total) onto the skin 2 (two) times a week. 24 patch 4   Magnesium 250 MG TABS Take 2 tablets by mouth at bedtime.     naproxen sodium (ALEVE) 220 MG tablet Take 220 mg by mouth 2 (two) times daily with a meal.     Omega-3 Fatty Acids (FISH OIL) 1000 MG CAPS Take by mouth.     Probiotic Product (PROBIOTIC BLEND PO) Take by mouth.     scopolamine  (TRANSDERM-SCOP) 1 MG/3DAYS Place 1 patch (1.5 mg total) onto the skin behind ear every 3 (three) days. 4 patch 0   SYNTHROID  100 MCG tablet Take 1 tablet (100 mcg total) by mouth in the morning on an empty stomach. 30 tablet 11   SYNTHROID  100 MCG tablet Take 1 tablet (100 mcg total) by mouth in the morning on an empty stomach. 30 tablet 11   TART CHERRY PO Take 2,400 mg by mouth daily.     triamterene -hydrochlorothiazide  (DYAZIDE ) 37.5-25 MG capsule Take 1 capsule by mouth daily. 90 capsule 2   Vitamin D , Ergocalciferol , (DRISDOL ) 1.25 MG (50000 UNIT) CAPS capsule Take 1 capsule by mouth once a week 12 capsule 4   No current facility-administered medications on file prior to visit.     Review of  Systems  Constitutional:  Negative for fever.  Respiratory:  Negative for cough, shortness of breath and wheezing.   Cardiovascular:  Positive for palpitations (with caffeine which she does not drinko). Negative for chest pain and leg swelling.  Gastrointestinal:  Negative for abdominal pain, constipation and diarrhea.       No gerd  Neurological:  Negative for light-headedness and headaches.       Objective:   Vitals:   04/16/24 1015 04/16/24 1107  BP: (!) 140/88 128/76  Pulse: 72   Temp: 98.3 F (36.8 C)   SpO2: 95%    BP Readings from Last 3 Encounters:  04/16/24 128/76  09/10/23 111/89  08/13/23 108/74   Wt Readings from Last 3 Encounters:  04/16/24 200 lb 9.6 oz (91 kg)  10/13/23 198 lb (89.8 kg)  08/27/23 198 lb (89.8 kg)   Body mass index is 34.43 kg/m.    Physical Exam Constitutional:      General: She is not in acute distress.    Appearance: Normal appearance.  HENT:     Head: Normocephalic and atraumatic.  Eyes:     Conjunctiva/sclera: Conjunctivae normal.  Cardiovascular:     Rate and Rhythm: Normal rate and regular rhythm.  Heart sounds: Normal heart sounds.  Pulmonary:     Effort: Pulmonary effort is normal. No respiratory distress.     Breath sounds: Normal breath sounds. No wheezing.  Musculoskeletal:     Cervical back: Neck supple.     Right lower leg: No edema.     Left lower leg: No edema.  Lymphadenopathy:     Cervical: No cervical adenopathy.  Skin:    General: Skin is warm and dry.     Findings: No rash.  Neurological:     Mental Status: She is alert. Mental status is at baseline.  Psychiatric:        Mood and Affect: Mood normal.        Behavior: Behavior normal.        Lab Results  Component Value Date   WBC 6.4 04/10/2023   HGB 14.1 04/10/2023   HCT 40.8 04/10/2023   PLT 246.0 04/10/2023   GLUCOSE 90 04/10/2023   CHOL 250 (H) 04/10/2023   TRIG 169.0 (H) 04/10/2023   HDL 52.80 04/10/2023   LDLDIRECT 183.0 07/27/2008    LDLCALC 164 (H) 04/10/2023   ALT 16 04/10/2023   AST 20 04/10/2023   NA 137 04/10/2023   K 3.6 04/10/2023   CL 99 04/10/2023   CREATININE 0.71 04/10/2023   BUN 17 04/10/2023   CO2 31 04/10/2023   TSH 2.04 04/10/2023   HGBA1C 5.3 04/10/2023     Assessment & Plan:    See Problem List for Assessment and Plan of chronic medical problems.

## 2024-04-16 ENCOUNTER — Ambulatory Visit (INDEPENDENT_AMBULATORY_CARE_PROVIDER_SITE_OTHER): Admitting: Internal Medicine

## 2024-04-16 VITALS — BP 128/76 | HR 72 | Temp 98.3°F | Ht 64.0 in | Wt 200.6 lb

## 2024-04-16 DIAGNOSIS — I10 Essential (primary) hypertension: Secondary | ICD-10-CM | POA: Diagnosis not present

## 2024-04-16 DIAGNOSIS — E559 Vitamin D deficiency, unspecified: Secondary | ICD-10-CM

## 2024-04-16 DIAGNOSIS — R739 Hyperglycemia, unspecified: Secondary | ICD-10-CM | POA: Diagnosis not present

## 2024-04-16 DIAGNOSIS — E6609 Other obesity due to excess calories: Secondary | ICD-10-CM

## 2024-04-16 DIAGNOSIS — E66811 Obesity, class 1: Secondary | ICD-10-CM | POA: Diagnosis not present

## 2024-04-16 DIAGNOSIS — Z6834 Body mass index (BMI) 34.0-34.9, adult: Secondary | ICD-10-CM

## 2024-04-16 DIAGNOSIS — E782 Mixed hyperlipidemia: Secondary | ICD-10-CM | POA: Diagnosis not present

## 2024-04-16 DIAGNOSIS — E890 Postprocedural hypothyroidism: Secondary | ICD-10-CM

## 2024-04-16 LAB — LIPID PANEL
Cholesterol: 251 mg/dL — ABNORMAL HIGH (ref 0–200)
HDL: 57.8 mg/dL (ref >39.00)
LDL Cholesterol: 171 mg/dL — ABNORMAL HIGH (ref 0–99)
NonHDL: 192.79
Total CHOL/HDL Ratio: 4
Triglycerides: 110 mg/dL (ref 0.0–149.0)
VLDL: 22 mg/dL (ref 0.0–40.0)

## 2024-04-16 LAB — COMPREHENSIVE METABOLIC PANEL WITH GFR
ALT: 17 U/L (ref 0–35)
AST: 16 U/L (ref 0–37)
Albumin: 4.4 g/dL (ref 3.5–5.2)
Alkaline Phosphatase: 80 U/L (ref 39–117)
BUN: 13 mg/dL (ref 6–23)
CO2: 29 meq/L (ref 19–32)
Calcium: 9.1 mg/dL (ref 8.4–10.5)
Chloride: 100 meq/L (ref 96–112)
Creatinine, Ser: 0.65 mg/dL (ref 0.40–1.20)
GFR: 89.31 mL/min (ref >60.00)
Glucose, Bld: 99 mg/dL (ref 70–99)
Potassium: 3.8 meq/L (ref 3.5–5.1)
Sodium: 137 meq/L (ref 135–145)
Total Bilirubin: 0.4 mg/dL (ref 0.2–1.2)
Total Protein: 7 g/dL (ref 6.0–8.3)

## 2024-04-16 LAB — CBC WITH DIFFERENTIAL/PLATELET
Basophils Absolute: 0.1 10*3/uL (ref 0.0–0.1)
Basophils Relative: 0.9 % (ref 0.0–3.0)
Eosinophils Absolute: 0.1 10*3/uL (ref 0.0–0.7)
Eosinophils Relative: 2.2 % (ref 0.0–5.0)
HCT: 42 % (ref 36.0–46.0)
Hemoglobin: 14.3 g/dL (ref 12.0–15.0)
Lymphocytes Relative: 26.1 % (ref 12.0–46.0)
Lymphs Abs: 1.7 10*3/uL (ref 0.7–4.0)
MCHC: 34 g/dL (ref 30.0–36.0)
MCV: 90.5 fl (ref 78.0–100.0)
Monocytes Absolute: 0.5 10*3/uL (ref 0.1–1.0)
Monocytes Relative: 8.1 % (ref 3.0–12.0)
Neutro Abs: 4.2 10*3/uL (ref 1.4–7.7)
Neutrophils Relative %: 62.7 % (ref 43.0–77.0)
Platelets: 209 10*3/uL (ref 150.0–400.0)
RBC: 4.64 Mil/uL (ref 3.87–5.11)
RDW: 13.4 % (ref 11.5–15.5)
WBC: 6.7 10*3/uL (ref 4.0–10.5)

## 2024-04-16 LAB — HEMOGLOBIN A1C: Hgb A1c MFr Bld: 5.5 % (ref 4.6–6.5)

## 2024-04-16 LAB — VITAMIN D 25 HYDROXY (VIT D DEFICIENCY, FRACTURES): VITD: 47.04 ng/mL (ref 30.00–100.00)

## 2024-04-16 NOTE — Assessment & Plan Note (Signed)
 Chronic Taking vitamin D daily Check vitamin D level

## 2024-04-16 NOTE — Assessment & Plan Note (Signed)
 Chronic Blood pressure well controlled CMP, CBC Continue amlodipine -benazepril  5-20 mg daily, triamterene -HCTZ 37.5-25 mg daily

## 2024-04-16 NOTE — Assessment & Plan Note (Signed)
Chronic ?Managed by Dr. Ballin ?On Synthroid 100 mcg daily ?

## 2024-04-16 NOTE — Assessment & Plan Note (Signed)
 Chronic Lab Results  Component Value Date   HGBA1C 5.3 04/10/2023   Check a1c Low sugar / carb diet Stressed regular exercise

## 2024-04-16 NOTE — Assessment & Plan Note (Signed)
 Chronic Regular exercise and healthy diet encouraged Check lipid panel, CMP Elevated lipids, elevated ASCVD risk CT CAC 0 in  2024

## 2024-04-16 NOTE — Assessment & Plan Note (Signed)
 Chronic She is exercising Advise decrease portions-healthy diet Encouraged weight loss

## 2024-04-18 ENCOUNTER — Ambulatory Visit: Payer: Self-pay | Admitting: Internal Medicine

## 2024-04-28 ENCOUNTER — Other Ambulatory Visit (HOSPITAL_BASED_OUTPATIENT_CLINIC_OR_DEPARTMENT_OTHER): Payer: Self-pay

## 2024-04-28 ENCOUNTER — Other Ambulatory Visit (HOSPITAL_COMMUNITY): Payer: Self-pay

## 2024-04-28 MED ORDER — SYNTHROID 100 MCG PO TABS
100.0000 ug | ORAL_TABLET | Freq: Every morning | ORAL | 11 refills | Status: AC
  Filled 2024-04-28 – 2024-06-23 (×2): qty 90, 90d supply, fill #0
  Filled 2024-09-17: qty 90, 90d supply, fill #1
  Filled 2024-09-18: qty 90, 90d supply, fill #0
  Filled 2024-12-15: qty 90, 90d supply, fill #1

## 2024-05-26 DIAGNOSIS — D225 Melanocytic nevi of trunk: Secondary | ICD-10-CM | POA: Diagnosis not present

## 2024-05-26 DIAGNOSIS — Z1283 Encounter for screening for malignant neoplasm of skin: Secondary | ICD-10-CM | POA: Diagnosis not present

## 2024-06-23 ENCOUNTER — Other Ambulatory Visit (HOSPITAL_BASED_OUTPATIENT_CLINIC_OR_DEPARTMENT_OTHER): Payer: Self-pay

## 2024-06-29 DIAGNOSIS — H524 Presbyopia: Secondary | ICD-10-CM | POA: Diagnosis not present

## 2024-06-29 DIAGNOSIS — D3131 Benign neoplasm of right choroid: Secondary | ICD-10-CM | POA: Diagnosis not present

## 2024-06-29 DIAGNOSIS — H2513 Age-related nuclear cataract, bilateral: Secondary | ICD-10-CM | POA: Diagnosis not present

## 2024-06-30 DIAGNOSIS — E559 Vitamin D deficiency, unspecified: Secondary | ICD-10-CM | POA: Diagnosis not present

## 2024-06-30 DIAGNOSIS — E890 Postprocedural hypothyroidism: Secondary | ICD-10-CM | POA: Diagnosis not present

## 2024-06-30 DIAGNOSIS — R7301 Impaired fasting glucose: Secondary | ICD-10-CM | POA: Diagnosis not present

## 2024-06-30 DIAGNOSIS — E7800 Pure hypercholesterolemia, unspecified: Secondary | ICD-10-CM | POA: Diagnosis not present

## 2024-07-06 DIAGNOSIS — I10 Essential (primary) hypertension: Secondary | ICD-10-CM | POA: Diagnosis not present

## 2024-07-06 DIAGNOSIS — R7301 Impaired fasting glucose: Secondary | ICD-10-CM | POA: Diagnosis not present

## 2024-07-06 DIAGNOSIS — E890 Postprocedural hypothyroidism: Secondary | ICD-10-CM | POA: Diagnosis not present

## 2024-07-06 DIAGNOSIS — E041 Nontoxic single thyroid nodule: Secondary | ICD-10-CM | POA: Diagnosis not present

## 2024-07-06 DIAGNOSIS — K7689 Other specified diseases of liver: Secondary | ICD-10-CM | POA: Diagnosis not present

## 2024-07-06 DIAGNOSIS — E012 Iodine-deficiency related (endemic) goiter, unspecified: Secondary | ICD-10-CM | POA: Diagnosis not present

## 2024-07-06 DIAGNOSIS — E7800 Pure hypercholesterolemia, unspecified: Secondary | ICD-10-CM | POA: Diagnosis not present

## 2024-07-06 DIAGNOSIS — M8580 Other specified disorders of bone density and structure, unspecified site: Secondary | ICD-10-CM | POA: Diagnosis not present

## 2024-07-06 DIAGNOSIS — E559 Vitamin D deficiency, unspecified: Secondary | ICD-10-CM | POA: Diagnosis not present

## 2024-07-10 ENCOUNTER — Encounter (HOSPITAL_BASED_OUTPATIENT_CLINIC_OR_DEPARTMENT_OTHER): Payer: Self-pay

## 2024-07-10 ENCOUNTER — Other Ambulatory Visit (HOSPITAL_BASED_OUTPATIENT_CLINIC_OR_DEPARTMENT_OTHER): Payer: Self-pay

## 2024-07-12 ENCOUNTER — Other Ambulatory Visit (HOSPITAL_BASED_OUTPATIENT_CLINIC_OR_DEPARTMENT_OTHER): Payer: Self-pay

## 2024-07-19 ENCOUNTER — Telehealth: Payer: Self-pay | Admitting: Family Medicine
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
## 2024-07-21 ENCOUNTER — Other Ambulatory Visit (HOSPITAL_BASED_OUTPATIENT_CLINIC_OR_DEPARTMENT_OTHER): Payer: Self-pay

## 2024-07-21 DIAGNOSIS — Z01419 Encounter for gynecological examination (general) (routine) without abnormal findings: Secondary | ICD-10-CM | POA: Diagnosis not present

## 2024-07-21 DIAGNOSIS — Z780 Asymptomatic menopausal state: Secondary | ICD-10-CM | POA: Diagnosis not present

## 2024-07-21 DIAGNOSIS — Z79890 Hormone replacement therapy (postmenopausal): Secondary | ICD-10-CM | POA: Diagnosis not present

## 2024-07-21 MED ORDER — ESTRADIOL 0.05 MG/24HR TD PTTW
1.0000 | MEDICATED_PATCH | TRANSDERMAL | 4 refills | Status: AC
  Filled 2024-07-21: qty 24, 84d supply, fill #0
  Filled 2024-08-20: qty 24, 84d supply, fill #1

## 2024-07-29 ENCOUNTER — Other Ambulatory Visit (HOSPITAL_BASED_OUTPATIENT_CLINIC_OR_DEPARTMENT_OTHER): Payer: Self-pay

## 2024-07-29 MED ORDER — ROSUVASTATIN CALCIUM 5 MG PO TABS
5.0000 mg | ORAL_TABLET | ORAL | 3 refills | Status: AC
  Filled 2024-07-29: qty 36, 84d supply, fill #0
  Filled 2024-10-05: qty 36, 84d supply, fill #1

## 2024-07-30 ENCOUNTER — Other Ambulatory Visit (HOSPITAL_BASED_OUTPATIENT_CLINIC_OR_DEPARTMENT_OTHER): Payer: Self-pay

## 2024-07-30 ENCOUNTER — Other Ambulatory Visit (HOSPITAL_COMMUNITY): Payer: Self-pay

## 2024-08-03 DIAGNOSIS — H903 Sensorineural hearing loss, bilateral: Secondary | ICD-10-CM | POA: Diagnosis not present

## 2024-08-06 ENCOUNTER — Other Ambulatory Visit: Payer: Self-pay | Admitting: Internal Medicine

## 2024-08-06 DIAGNOSIS — Z1231 Encounter for screening mammogram for malignant neoplasm of breast: Secondary | ICD-10-CM

## 2024-08-12 NOTE — Progress Notes (Signed)
    Subjective:    Patient ID: Joan [name], female    DOB: 11-01-1956, 68 y.o.   MRN: [id number]      HPI Joan [name] is here for No chief complaint on file.        Medications and allergies reviewed with patient and updated if appropriate.  Current Outpatient Medications on File Prior to Visit  Medication Sig Dispense Refill   amLODipine -benazepril  (LOTREL ) 5-20 MG capsule Take 1 capsule by mouth daily. 90 capsule 2   cetirizine  (ZYRTEC ) 10 MG tablet Take 10 mg by mouth daily as needed for allergies.     doxycycline  (VIBRAMYCIN ) 50 MG capsule Take 1 capsule (50 mg total) by mouth daily. 30 capsule 0   estradiol  (VIVELLE -DOT) 0.05 MG/24HR patch Place 1 patch (0.05 mg total) onto the skin 2 (two) times a week. 24 patch 4   estradiol  (VIVELLE -DOT) 0.05 MG/24HR patch Place 1 patch (0.05 mg total) onto the skin 2 (two) times a week. 24 patch 4   Magnesium 250 MG TABS Take 2 tablets by mouth at bedtime.     naproxen sodium (ALEVE) 220 MG tablet Take 220 mg by mouth 2 (two) times daily with a meal.     Omega-3 Fatty Acids (FISH OIL) 1000 MG CAPS Take by mouth.     Probiotic Product (PROBIOTIC BLEND PO) Take by mouth.     rosuvastatin  (CRESTOR ) 5 MG tablet Take 1 tablet (5 mg total) by mouth 3 (three) times a week. 36 tablet 3   scopolamine  (TRANSDERM-SCOP) 1 MG/3DAYS Place 1 patch (1.5 mg total) onto the skin behind ear every 3 (three) days. 4 patch 0   SYNTHROID  100 MCG tablet Take 1 tablet (100 mcg total) by mouth in the morning on an empty stomach. 30 tablet 11   SYNTHROID  100 MCG tablet Take 1 tablet (100 mcg total) by mouth in the morning on an empty stomach 90 tablet 11   TART CHERRY PO Take 2,400 mg by mouth daily.     triamterene -hydrochlorothiazide  (DYAZIDE ) 37.5-25 MG capsule Take 1 capsule by mouth daily. 90 capsule 2   Vitamin D , Ergocalciferol , (DRISDOL ) 1.25 MG (50000 UNIT) CAPS capsule Take 1 capsule by mouth once a week 12 capsule 4   No current facility-administered  medications on file prior to visit.    Review of Systems     Objective:  There were no vitals filed for this visit. BP Readings from Last 3 Encounters:  04/16/24 128/76  09/10/23 111/89  08/13/23 108/74   Wt Readings from Last 3 Encounters:  04/16/24 200 lb 9.6 oz (91 kg)  10/13/23 198 lb (89.8 kg)  08/27/23 198 lb (89.8 kg)   There is no height or weight on file to calculate BMI.    Physical Exam         Assessment & Plan:    See Problem List for Assessment and Plan of chronic medical problems.

## 2024-08-13 ENCOUNTER — Encounter: Payer: Self-pay | Admitting: Internal Medicine

## 2024-08-13 ENCOUNTER — Ambulatory Visit (INDEPENDENT_AMBULATORY_CARE_PROVIDER_SITE_OTHER)

## 2024-08-13 ENCOUNTER — Ambulatory Visit (INDEPENDENT_AMBULATORY_CARE_PROVIDER_SITE_OTHER): Admitting: Internal Medicine

## 2024-08-13 VITALS — BP 124/78 | HR 93 | Temp 98.1°F | Ht 64.0 in | Wt 198.0 lb

## 2024-08-13 DIAGNOSIS — R0781 Pleurodynia: Secondary | ICD-10-CM

## 2024-08-13 DIAGNOSIS — I10 Essential (primary) hypertension: Secondary | ICD-10-CM | POA: Diagnosis not present

## 2024-08-13 NOTE — Assessment & Plan Note (Signed)
 Chronic Blood pressure well controlled Continue amlodipine -benazepril  5-20 mg daily, triamterene -HCTZ 37.5-25 mg daily

## 2024-08-13 NOTE — Patient Instructions (Signed)
      An xray was ordered.       Medications changes include :   take aleve twice daily for 7-10 days    If the pain is not improving I would recommend seeing Dr Claudene.

## 2024-08-13 NOTE — Assessment & Plan Note (Signed)
 Acute Having left lower rib discomfort/tenderness for approximately 6 weeks Started with cold symptoms where she was coughing excessively and has persisted No residual cold symptoms at this time Pain seems to be worse when she is laying on her left side Has not taken anything for it Likely costochondritis versus rib cage dysfunction following significant coughing recommended taking Aleve twice a day for at least 1 week to see if that helps Will get left rib/chest x-ray today which I expect to be normal If symptoms do not improve consider seeing Dr. Claudene for evaluation of rib cage

## 2024-08-20 ENCOUNTER — Ambulatory Visit: Payer: Self-pay | Admitting: Internal Medicine

## 2024-08-20 ENCOUNTER — Other Ambulatory Visit (HOSPITAL_BASED_OUTPATIENT_CLINIC_OR_DEPARTMENT_OTHER): Payer: Self-pay

## 2024-08-31 ENCOUNTER — Other Ambulatory Visit (HOSPITAL_BASED_OUTPATIENT_CLINIC_OR_DEPARTMENT_OTHER): Payer: Self-pay

## 2024-08-31 DIAGNOSIS — Z23 Encounter for immunization: Secondary | ICD-10-CM | POA: Diagnosis not present

## 2024-08-31 MED ORDER — FLUZONE HIGH-DOSE 0.5 ML IM SUSY
0.5000 mL | PREFILLED_SYRINGE | Freq: Once | INTRAMUSCULAR | 0 refills | Status: AC
  Filled 2024-08-31: qty 0.5, 1d supply, fill #0

## 2024-09-08 ENCOUNTER — Other Ambulatory Visit (HOSPITAL_BASED_OUTPATIENT_CLINIC_OR_DEPARTMENT_OTHER): Payer: Self-pay

## 2024-09-08 DIAGNOSIS — Z23 Encounter for immunization: Secondary | ICD-10-CM | POA: Diagnosis not present

## 2024-09-08 MED ORDER — COMIRNATY 30 MCG/0.3ML IM SUSY
0.3000 mL | PREFILLED_SYRINGE | Freq: Once | INTRAMUSCULAR | 0 refills | Status: AC
  Filled 2024-09-08: qty 0.3, 1d supply, fill #0

## 2024-09-17 ENCOUNTER — Other Ambulatory Visit: Payer: Self-pay

## 2024-09-18 ENCOUNTER — Other Ambulatory Visit (HOSPITAL_BASED_OUTPATIENT_CLINIC_OR_DEPARTMENT_OTHER): Payer: Self-pay

## 2024-09-18 ENCOUNTER — Other Ambulatory Visit (HOSPITAL_COMMUNITY): Payer: Self-pay

## 2024-09-22 ENCOUNTER — Ambulatory Visit
Admission: RE | Admit: 2024-09-22 | Discharge: 2024-09-22 | Disposition: A | Discharge status: Home / Self Care | Source: Ambulatory Visit | Attending: Internal Medicine | Admitting: Internal Medicine

## 2024-09-22 DIAGNOSIS — Z1231 Encounter for screening mammogram for malignant neoplasm of breast: Secondary | ICD-10-CM

## 2024-09-27 ENCOUNTER — Encounter: Payer: Self-pay | Admitting: Radiology

## 2024-09-30 DIAGNOSIS — E7800 Pure hypercholesterolemia, unspecified: Secondary | ICD-10-CM | POA: Diagnosis not present

## 2024-10-11 ENCOUNTER — Other Ambulatory Visit (HOSPITAL_BASED_OUTPATIENT_CLINIC_OR_DEPARTMENT_OTHER): Payer: Self-pay

## 2024-10-11 ENCOUNTER — Other Ambulatory Visit: Payer: Self-pay

## 2024-10-12 ENCOUNTER — Other Ambulatory Visit (HOSPITAL_BASED_OUTPATIENT_CLINIC_OR_DEPARTMENT_OTHER): Payer: Self-pay

## 2024-10-12 ENCOUNTER — Other Ambulatory Visit: Payer: Self-pay

## 2024-10-15 ENCOUNTER — Ambulatory Visit: Admitting: Internal Medicine

## 2024-10-15 ENCOUNTER — Other Ambulatory Visit (HOSPITAL_BASED_OUTPATIENT_CLINIC_OR_DEPARTMENT_OTHER): Payer: Self-pay

## 2024-10-15 ENCOUNTER — Ambulatory Visit (INDEPENDENT_AMBULATORY_CARE_PROVIDER_SITE_OTHER): Admitting: Internal Medicine

## 2024-10-15 ENCOUNTER — Encounter: Payer: Self-pay | Admitting: Internal Medicine

## 2024-10-15 VITALS — BP 126/80 | HR 101 | Temp 98.6°F | Ht 64.0 in | Wt 206.0 lb

## 2024-10-15 DIAGNOSIS — J019 Acute sinusitis, unspecified: Secondary | ICD-10-CM | POA: Insufficient documentation

## 2024-10-15 MED ORDER — AMOXICILLIN-POT CLAVULANATE 875-125 MG PO TABS
1.0000 | ORAL_TABLET | Freq: Two times a day (BID) | ORAL | 0 refills | Status: AC
  Filled 2024-10-15: qty 14, 7d supply, fill #0

## 2024-10-15 NOTE — Patient Instructions (Addendum)
  Medications changes include :   Augmentin twice daily x 7 days     Return if symptoms worsen or fail to improve.

## 2024-10-15 NOTE — Progress Notes (Signed)
 Subjective:    Patient ID: Joan [name], female    DOB: May 29, 1956, 68 y.o.   MRN: [id number]      HPI Joan [name] is here for  Chief Complaint  Patient presents with   Eye Pain    Left eye pain (cough and mucus) Patient notes having drainage around left eye; headache; Itchy in the corner of her eye   Discussed the use of AI scribe software for clinical note transcription with the patient, who gave verbal consent to proceed.  History of Present Illness Joan [name] is a 68 year old female who presents with sinus congestion and facial pain.  She has been experiencing sinus congestion and facial pain for about three weeks. Initially, she had a cold with significant nasal congestion and yellowish nasal discharge, particularly noticeable in the mornings. This week, she developed soreness in her eye socket, especially in the medial part, which persists in the corner of the eye. She also experiences itching and some tearing. No ear pain, fever, wheezing, or shortness of breath. She reports headaches and a cough at night due to drainage.  She started taking Dymatap the day before the visit, which she believes helped, and had been taking Tylenol  cold and flu medicine initially.    She still has the right lower rib pain.  She is concerned about hair loss and wonders if it could be related to her medication. Her grandfather on her mother's side was bald, but her mother had nice hair that thinned with age.     Medications and allergies reviewed with patient and updated if appropriate.  Current Outpatient Medications on File Prior to Visit  Medication Sig Dispense Refill   amLODipine -benazepril  (LOTREL ) 5-20 MG capsule Take 1 capsule by mouth daily. 90 capsule 2   cetirizine  (ZYRTEC ) 10 MG tablet Take 10 mg by mouth daily as needed for allergies.     doxycycline  (VIBRAMYCIN ) 50 MG capsule Take 1 capsule (50 mg total) by mouth daily. 30 capsule 0   estradiol  (VIVELLE -DOT) 0.05 MG/24HR  patch Place 1 patch (0.05 mg total) onto the skin 2 (two) times a week. 24 patch 4   estradiol  (VIVELLE -DOT) 0.05 MG/24HR patch Place 1 patch (0.05 mg total) onto the skin 2 (two) times a week. 24 patch 4   Magnesium 250 MG TABS Take 2 tablets by mouth at bedtime.     naproxen sodium (ALEVE) 220 MG tablet Take 220 mg by mouth 2 (two) times daily with a meal.     Omega-3 Fatty Acids (FISH OIL) 1000 MG CAPS Take by mouth.     Probiotic Product (PROBIOTIC BLEND PO) Take by mouth.     rosuvastatin  (CRESTOR ) 5 MG tablet Take 1 tablet (5 mg total) by mouth 3 (three) times a week. 36 tablet 3   scopolamine  (TRANSDERM-SCOP) 1 MG/3DAYS Place 1 patch (1.5 mg total) onto the skin behind ear every 3 (three) days. 4 patch 0   SYNTHROID  100 MCG tablet Take 1 tablet (100 mcg total) by mouth in the morning on an empty stomach. 30 tablet 11   SYNTHROID  100 MCG tablet Take 1 tablet (100 mcg total) by mouth in the morning on an empty stomach 90 tablet 11   TART CHERRY PO Take 2,400 mg by mouth daily.     triamterene -hydrochlorothiazide  (DYAZIDE ) 37.5-25 MG capsule Take 1 capsule by mouth daily. 90 capsule 2   Vitamin D , Ergocalciferol , (DRISDOL ) 1.25 MG (50000 UNIT) CAPS capsule Take 1 capsule by mouth  once a week 12 capsule 4   No current facility-administered medications on file prior to visit.    Review of Systems  Constitutional:  Negative for fever.  HENT:  Positive for congestion, postnasal drip and sinus pressure. Negative for ear pain.   Eyes:  Positive for pain (left eye soreness).  Respiratory:  Positive for cough.   Neurological:  Positive for headaches.       Objective:   Vitals:   10/15/24 1552  BP: 126/80  Pulse: (!) 101  Temp: 98.6 F (37 C)  SpO2: 96%   BP Readings from Last 3 Encounters:  10/15/24 126/80  08/13/24 124/78  04/16/24 128/76   Wt Readings from Last 3 Encounters:  10/15/24 206 lb (93.4 kg)  08/13/24 198 lb (89.8 kg)  04/16/24 200 lb 9.6 oz (91 kg)   Body mass  index is 35.36 kg/m.    Physical Exam Constitutional:      General: She is not in acute distress.    Appearance: Normal appearance. She is not ill-appearing.  HENT:     Head: Normocephalic and atraumatic.     Right Ear: Tympanic membrane, ear canal and external ear normal.     Left Ear: Tympanic membrane, ear canal and external ear normal.     Mouth/Throat:     Mouth: Mucous membranes are moist.     Pharynx: No oropharyngeal exudate or posterior oropharyngeal erythema.  Eyes:     Conjunctiva/sclera: Conjunctivae normal.  Cardiovascular:     Rate and Rhythm: Normal rate and regular rhythm.  Pulmonary:     Effort: Pulmonary effort is normal. No respiratory distress.     Breath sounds: Normal breath sounds. No wheezing or rales.  Musculoskeletal:     Cervical back: Neck supple. No tenderness.  Lymphadenopathy:     Cervical: No cervical adenopathy.  Skin:    General: Skin is warm and dry.  Neurological:     Mental Status: She is alert.            Assessment & Plan:    See Problem List for Assessment and Plan of chronic medical problems.

## 2024-10-15 NOTE — Assessment & Plan Note (Signed)
 Acute Likely bacterial  Start Augmentin 875-125 mg BID x 7 day otc cold medications Rest, fluid Call if no improvement

## 2024-10-29 ENCOUNTER — Ambulatory Visit

## 2024-11-19 ENCOUNTER — Ambulatory Visit: Payer: Self-pay

## 2024-11-23 ENCOUNTER — Ambulatory Visit: Payer: Self-pay

## 2024-11-23 ENCOUNTER — Other Ambulatory Visit: Payer: Self-pay | Admitting: Family

## 2024-11-23 ENCOUNTER — Other Ambulatory Visit (HOSPITAL_BASED_OUTPATIENT_CLINIC_OR_DEPARTMENT_OTHER): Payer: Self-pay

## 2024-11-23 MED ORDER — LIDOCAINE VISCOUS HCL 2 % MT SOLN
10.0000 mL | Freq: Four times a day (QID) | OROMUCOSAL | 0 refills | Status: AC | PRN
  Filled 2024-11-23: qty 360, 9d supply, fill #0

## 2024-11-23 NOTE — Telephone Encounter (Signed)
 Spoke with patient today.

## 2024-11-23 NOTE — Telephone Encounter (Signed)
 FYI Only or Action Required?: Action required by provider: update on patient condition and requesting mouth wash.  Patient was last seen in primary care on 10/15/2024 by Geofm Glade PARAS, MD.  Called Nurse Triage reporting Nasal Congestion.  Symptoms began several days ago.  Interventions attempted: Nothing.  Symptoms are: stable.  Triage Disposition: No disposition on file.  Patient/caregiver understands and will follow disposition?:   Copied from CRM [id number]. Topic: Clinical - Red Word Triage >> Nov 23, 2024 10:26 AM Ashley R wrote: Red Word that prompted transfer to Nurse Triage: states they did not get a callback from 12/26 persistent sore throat not going away, blisters, cough. Asking if she should go to urgent care   ----------------------------------------------------------------------- From previous Reason for Contact - Scheduling: Patient/patient representative is calling to schedule an appointment. Refer to attachments for appointment information. Reason for Disposition  [1] Sore throat is the only symptom AND [2] present > 48 hours  Answer Assessment - Initial Assessment Questions Patient had congestion and was given antibiotic. Finished those and feeling better but Sore throat still. Can see ulcers in back of throat. Does not want appointment, asking if some mouth wash could be called in. [facility] [employer]  1. ONSET: When did the throat start hurting? (Hours or days ago)      Days ago 5. FEVER: Do you have a fever? If Yes, ask: What is your temperature, how was it measured, and when did it start?     Denies 6. PUS ON THE TONSILS: Is there pus on the tonsils in the back of your throat?     Yes 7. OTHER SYMPTOMS: Do you have any other symptoms? (e.g., difficulty breathing, headache, rash)    Denies  Protocols used: Sore Throat-A-AH

## 2024-12-08 ENCOUNTER — Other Ambulatory Visit (HOSPITAL_BASED_OUTPATIENT_CLINIC_OR_DEPARTMENT_OTHER): Payer: Self-pay

## 2024-12-08 ENCOUNTER — Other Ambulatory Visit: Payer: Self-pay

## 2024-12-08 MED ORDER — NYSTATIN-TRIAMCINOLONE 100000-0.1 UNIT/GM-% EX OINT
TOPICAL_OINTMENT | CUTANEOUS | 5 refills | Status: AC
  Filled 2024-12-08: qty 15, 30d supply, fill #0

## 2024-12-08 MED ORDER — TRIAMCINOLONE ACETONIDE 0.1 % MT PSTE
PASTE | OROMUCOSAL | 2 refills | Status: AC
  Filled 2024-12-08: qty 5, 30d supply, fill #0

## 2024-12-08 MED ORDER — MINOXIDIL 2.5 MG PO TABS
2.5000 mg | ORAL_TABLET | Freq: Every day | ORAL | 5 refills | Status: AC
  Filled 2024-12-08: qty 30, 30d supply, fill #0

## 2024-12-10 ENCOUNTER — Ambulatory Visit

## 2024-12-15 ENCOUNTER — Other Ambulatory Visit (HOSPITAL_BASED_OUTPATIENT_CLINIC_OR_DEPARTMENT_OTHER): Payer: Self-pay

## 2024-12-15 ENCOUNTER — Other Ambulatory Visit: Payer: Self-pay

## 2025-04-20 ENCOUNTER — Encounter: Admitting: Internal Medicine

## 2025-05-31 ENCOUNTER — Ambulatory Visit
